# Patient Record
Sex: Female | Born: 1970 | Race: Black or African American | Hispanic: No | Smoking: Current every day smoker
Health system: Southern US, Community
[De-identification: ages and names within clinical notes are randomized; demographics above are authoritative.]

## PROBLEM LIST (undated history)

## (undated) DIAGNOSIS — K219 Gastro-esophageal reflux disease without esophagitis: Secondary | ICD-10-CM

## (undated) DIAGNOSIS — K602 Anal fissure, unspecified: Secondary | ICD-10-CM

## (undated) DIAGNOSIS — E669 Obesity, unspecified: Secondary | ICD-10-CM

## (undated) DIAGNOSIS — D649 Anemia, unspecified: Secondary | ICD-10-CM

## (undated) DIAGNOSIS — T7840XA Allergy, unspecified, initial encounter: Secondary | ICD-10-CM

## (undated) DIAGNOSIS — D259 Leiomyoma of uterus, unspecified: Secondary | ICD-10-CM

## (undated) DIAGNOSIS — K829 Disease of gallbladder, unspecified: Secondary | ICD-10-CM

## (undated) DIAGNOSIS — M542 Cervicalgia: Secondary | ICD-10-CM

## (undated) DIAGNOSIS — I10 Essential (primary) hypertension: Secondary | ICD-10-CM

## (undated) DIAGNOSIS — Z9889 Other specified postprocedural states: Secondary | ICD-10-CM

## (undated) DIAGNOSIS — E0590 Thyrotoxicosis, unspecified without thyrotoxic crisis or storm: Secondary | ICD-10-CM

## (undated) DIAGNOSIS — F419 Anxiety disorder, unspecified: Secondary | ICD-10-CM

## (undated) DIAGNOSIS — R51 Headache: Secondary | ICD-10-CM

## (undated) DIAGNOSIS — E041 Nontoxic single thyroid nodule: Secondary | ICD-10-CM

## (undated) DIAGNOSIS — C50919 Malignant neoplasm of unspecified site of unspecified female breast: Secondary | ICD-10-CM

## (undated) DIAGNOSIS — R112 Nausea with vomiting, unspecified: Secondary | ICD-10-CM

## (undated) DIAGNOSIS — F32A Depression, unspecified: Secondary | ICD-10-CM

## (undated) DIAGNOSIS — G8929 Other chronic pain: Secondary | ICD-10-CM

## (undated) DIAGNOSIS — M1990 Unspecified osteoarthritis, unspecified site: Secondary | ICD-10-CM

## (undated) DIAGNOSIS — F329 Major depressive disorder, single episode, unspecified: Secondary | ICD-10-CM

## (undated) HISTORY — DX: Other chronic pain: G89.29

## (undated) HISTORY — PX: LUMBAR DISC SURGERY: SHX700

## (undated) HISTORY — PX: BREAST REDUCTION SURGERY: SHX8

## (undated) HISTORY — DX: Disease of gallbladder, unspecified: K82.9

## (undated) HISTORY — PX: CERVICAL SPINE SURGERY: SHX589

## (undated) HISTORY — DX: Unspecified osteoarthritis, unspecified site: M19.90

## (undated) HISTORY — DX: Malignant neoplasm of unspecified site of unspecified female breast: C50.919

## (undated) HISTORY — DX: Anxiety disorder, unspecified: F41.9

## (undated) HISTORY — DX: Cervicalgia: M54.2

## (undated) HISTORY — DX: Leiomyoma of uterus, unspecified: D25.9

## (undated) HISTORY — DX: Gastro-esophageal reflux disease without esophagitis: K21.9

## (undated) HISTORY — DX: Essential (primary) hypertension: I10

## (undated) HISTORY — DX: Allergy, unspecified, initial encounter: T78.40XA

## (undated) HISTORY — DX: Major depressive disorder, single episode, unspecified: F32.9

## (undated) HISTORY — DX: Anemia, unspecified: D64.9

## (undated) HISTORY — DX: Thyrotoxicosis, unspecified without thyrotoxic crisis or storm: E05.90

## (undated) HISTORY — DX: Anal fissure, unspecified: K60.2

## (undated) HISTORY — DX: Nontoxic single thyroid nodule: E04.1

## (undated) HISTORY — PX: MOUTH SURGERY: SHX715

## (undated) HISTORY — DX: Depression, unspecified: F32.A

## (undated) HISTORY — DX: Obesity, unspecified: E66.9

---

## 1990-11-19 HISTORY — PX: WISDOM TOOTH EXTRACTION: SHX21

## 1991-11-20 HISTORY — PX: CHOLECYSTECTOMY: SHX55

## 1999-01-20 ENCOUNTER — Other Ambulatory Visit: Admission: RE | Admit: 1999-01-20 | Discharge: 1999-01-20 | Payer: Self-pay | Admitting: *Deleted

## 1999-11-20 HISTORY — PX: OTHER SURGICAL HISTORY: SHX169

## 2000-03-15 ENCOUNTER — Other Ambulatory Visit: Admission: RE | Admit: 2000-03-15 | Discharge: 2000-03-15 | Payer: Self-pay | Admitting: *Deleted

## 2000-04-30 ENCOUNTER — Encounter (INDEPENDENT_AMBULATORY_CARE_PROVIDER_SITE_OTHER): Payer: Self-pay

## 2000-04-30 ENCOUNTER — Other Ambulatory Visit: Admission: RE | Admit: 2000-04-30 | Discharge: 2000-04-30 | Payer: Self-pay | Admitting: Obstetrics & Gynecology

## 2001-02-24 ENCOUNTER — Encounter: Payer: Self-pay | Admitting: Otolaryngology

## 2001-02-24 ENCOUNTER — Ambulatory Visit (HOSPITAL_COMMUNITY): Admission: RE | Admit: 2001-02-24 | Discharge: 2001-02-24 | Payer: Self-pay | Admitting: Otolaryngology

## 2001-03-14 ENCOUNTER — Ambulatory Visit (HOSPITAL_BASED_OUTPATIENT_CLINIC_OR_DEPARTMENT_OTHER): Admission: RE | Admit: 2001-03-14 | Discharge: 2001-03-14 | Payer: Self-pay | Admitting: Otolaryngology

## 2001-04-10 ENCOUNTER — Other Ambulatory Visit: Admission: RE | Admit: 2001-04-10 | Discharge: 2001-04-10 | Payer: Self-pay | Admitting: *Deleted

## 2001-08-08 ENCOUNTER — Encounter: Payer: Self-pay | Admitting: Family Medicine

## 2001-08-08 ENCOUNTER — Ambulatory Visit (HOSPITAL_COMMUNITY): Admission: RE | Admit: 2001-08-08 | Discharge: 2001-08-08 | Payer: Self-pay | Admitting: Family Medicine

## 2001-11-14 ENCOUNTER — Ambulatory Visit (HOSPITAL_COMMUNITY): Admission: RE | Admit: 2001-11-14 | Discharge: 2001-11-14 | Payer: Self-pay | Admitting: Family Medicine

## 2001-11-19 HISTORY — PX: OTHER SURGICAL HISTORY: SHX169

## 2002-02-12 ENCOUNTER — Ambulatory Visit (HOSPITAL_COMMUNITY): Admission: RE | Admit: 2002-02-12 | Discharge: 2002-02-12 | Payer: Self-pay | Admitting: Neurosurgery

## 2002-04-07 ENCOUNTER — Emergency Department (HOSPITAL_COMMUNITY): Admission: EM | Admit: 2002-04-07 | Discharge: 2002-04-08 | Payer: Self-pay | Admitting: Emergency Medicine

## 2002-05-07 ENCOUNTER — Inpatient Hospital Stay (HOSPITAL_COMMUNITY): Admission: RE | Admit: 2002-05-07 | Discharge: 2002-05-12 | Payer: Self-pay | Admitting: Neurosurgery

## 2003-01-18 ENCOUNTER — Ambulatory Visit (HOSPITAL_COMMUNITY): Admission: RE | Admit: 2003-01-18 | Discharge: 2003-01-18 | Payer: Self-pay | Admitting: Neurosurgery

## 2003-01-27 ENCOUNTER — Emergency Department (HOSPITAL_COMMUNITY): Admission: EM | Admit: 2003-01-27 | Discharge: 2003-01-28 | Payer: Self-pay | Admitting: *Deleted

## 2003-06-22 ENCOUNTER — Emergency Department (HOSPITAL_COMMUNITY): Admission: EM | Admit: 2003-06-22 | Discharge: 2003-06-22 | Payer: Self-pay | Admitting: Emergency Medicine

## 2006-02-15 ENCOUNTER — Emergency Department (HOSPITAL_COMMUNITY): Admission: EM | Admit: 2006-02-15 | Discharge: 2006-02-16 | Payer: Self-pay | Admitting: Emergency Medicine

## 2006-06-26 ENCOUNTER — Emergency Department (HOSPITAL_COMMUNITY): Admission: EM | Admit: 2006-06-26 | Discharge: 2006-06-27 | Payer: Self-pay | Admitting: Emergency Medicine

## 2008-09-07 ENCOUNTER — Emergency Department (HOSPITAL_COMMUNITY): Admission: EM | Admit: 2008-09-07 | Discharge: 2008-09-07 | Payer: Self-pay | Admitting: Emergency Medicine

## 2008-10-02 ENCOUNTER — Emergency Department (HOSPITAL_COMMUNITY): Admission: EM | Admit: 2008-10-02 | Discharge: 2008-10-02 | Payer: Self-pay | Admitting: Emergency Medicine

## 2009-03-13 ENCOUNTER — Emergency Department (HOSPITAL_BASED_OUTPATIENT_CLINIC_OR_DEPARTMENT_OTHER): Admission: EM | Admit: 2009-03-13 | Discharge: 2009-03-14 | Payer: Self-pay | Admitting: Emergency Medicine

## 2009-03-19 LAB — CONVERTED CEMR LAB

## 2009-10-05 ENCOUNTER — Emergency Department (HOSPITAL_COMMUNITY): Admission: EM | Admit: 2009-10-05 | Discharge: 2009-10-05 | Payer: Self-pay | Admitting: Family Medicine

## 2010-02-08 ENCOUNTER — Emergency Department (HOSPITAL_COMMUNITY): Admission: EM | Admit: 2010-02-08 | Discharge: 2010-02-08 | Payer: Self-pay | Admitting: Emergency Medicine

## 2010-03-22 ENCOUNTER — Ambulatory Visit: Payer: Self-pay | Admitting: Internal Medicine

## 2010-03-22 DIAGNOSIS — J309 Allergic rhinitis, unspecified: Secondary | ICD-10-CM | POA: Insufficient documentation

## 2010-03-22 DIAGNOSIS — J45909 Unspecified asthma, uncomplicated: Secondary | ICD-10-CM | POA: Insufficient documentation

## 2010-03-22 DIAGNOSIS — K219 Gastro-esophageal reflux disease without esophagitis: Secondary | ICD-10-CM | POA: Insufficient documentation

## 2010-03-22 DIAGNOSIS — G43909 Migraine, unspecified, not intractable, without status migrainosus: Secondary | ICD-10-CM | POA: Insufficient documentation

## 2010-03-22 DIAGNOSIS — F418 Other specified anxiety disorders: Secondary | ICD-10-CM | POA: Insufficient documentation

## 2010-03-22 DIAGNOSIS — M19019 Primary osteoarthritis, unspecified shoulder: Secondary | ICD-10-CM | POA: Insufficient documentation

## 2010-03-22 DIAGNOSIS — Z8679 Personal history of other diseases of the circulatory system: Secondary | ICD-10-CM | POA: Insufficient documentation

## 2010-03-22 DIAGNOSIS — I10 Essential (primary) hypertension: Secondary | ICD-10-CM | POA: Insufficient documentation

## 2010-03-22 DIAGNOSIS — F329 Major depressive disorder, single episode, unspecified: Secondary | ICD-10-CM | POA: Insufficient documentation

## 2010-07-18 ENCOUNTER — Ambulatory Visit: Payer: Self-pay | Admitting: Internal Medicine

## 2010-07-25 ENCOUNTER — Ambulatory Visit: Payer: Self-pay | Admitting: Internal Medicine

## 2010-07-25 DIAGNOSIS — M545 Low back pain, unspecified: Secondary | ICD-10-CM | POA: Insufficient documentation

## 2010-07-25 DIAGNOSIS — J329 Chronic sinusitis, unspecified: Secondary | ICD-10-CM | POA: Insufficient documentation

## 2010-07-25 LAB — CONVERTED CEMR LAB
Basophils Absolute: 0.1 10*3/uL (ref 0.0–0.1)
Basophils Relative: 0.7 % (ref 0.0–3.0)
Bilirubin Urine: NEGATIVE
Eosinophils Absolute: 0.2 10*3/uL (ref 0.0–0.7)
Eosinophils Relative: 1.9 % (ref 0.0–5.0)
HCT: 40.5 % (ref 36.0–46.0)
Hemoglobin, Urine: NEGATIVE
Hemoglobin: 13.9 g/dL (ref 12.0–15.0)
Ketones, ur: NEGATIVE mg/dL
Lymphocytes Relative: 39.5 % (ref 12.0–46.0)
Lymphs Abs: 5.2 10*3/uL — ABNORMAL HIGH (ref 0.7–4.0)
MCHC: 34.3 g/dL (ref 30.0–36.0)
MCV: 89.3 fL (ref 78.0–100.0)
Monocytes Absolute: 1.1 10*3/uL — ABNORMAL HIGH (ref 0.1–1.0)
Monocytes Relative: 8.4 % (ref 3.0–12.0)
Neutro Abs: 6.5 10*3/uL (ref 1.4–7.7)
Neutrophils Relative %: 49.5 % (ref 43.0–77.0)
Nitrite: NEGATIVE
Platelets: 263 10*3/uL (ref 150.0–400.0)
RBC: 4.54 M/uL (ref 3.87–5.11)
RDW: 12.4 % (ref 11.5–14.6)
Specific Gravity, Urine: 1.015 (ref 1.000–1.030)
Total Protein, Urine: NEGATIVE mg/dL
Urine Glucose: NEGATIVE mg/dL
Urobilinogen, UA: 0.2 (ref 0.0–1.0)
WBC: 13.2 10*3/uL — ABNORMAL HIGH (ref 4.5–10.5)
pH: 6.5 (ref 5.0–8.0)

## 2010-12-19 NOTE — Assessment & Plan Note (Signed)
Summary: FU---STC   Vital Signs:  Patient profile:   40 year old female Height:      59 inches Weight:      178.75 pounds O2 Sat:      96 % Temp:     99 degrees F oral Pulse rate:   69 / minute BP sitting:   140 / 86  (left arm) Cuff size:   regular  Vitals Entered By: patty berrocal/intern CC: 4 month f/u Is Patient Diabetic? No Pain Assessment Patient in pain? yes     Location: lower back Type: aching Comments complains of low back pain, radiates into R front leg, also knot in back   Primary Care Provider:  Newt Lukes MD  CC:  4 month f/u.  History of Present Illness: c/o left side back pain - radiates into right anterior thigh - onset 3 weeks ago - no precipitating trauma or injury - worse with standing/walking, better with prone- current pain different than usual pain symptom- location  1) HTN - never on meds for same - onset of problems noted >10mo ago symptoms not a/w CP, HA, edema or vision changes  2) cont c/o abd pain and bloating symptoms better with Align but ran out of samples also better with peppermint - worse with any food but can occur even when fasting - no n/v or change in bowels -  no weight loss, +weight gain over past 6 months no d/c or brbpr or melena not using any OTC meds for gerd  3) cont c/o allg rhinitis and sinus pressure - +sneezing and clear nasal discharge - no fever, no HA or vision change - no cough using allegra and fonase but increasing pressure below both eyes  4) chronic pain - neck and right shoulder - recieved steroid and trigger point injections at pain clinic in [place] for same - also uses as needed lortab for pain symptoms   5) hx depression - not taking meds >22mos - reports her faith and her dog help her control her symptoms -  no depression signs or symptoms active at this time   Current Medications (verified): 1)  Celebrex 200 Mg Caps (Celecoxib) .... Take 1 Two Times A Day 2)  Zanaflex 6 Mg Caps  (Tizanidine Hcl) .... Take 1 Three Times A Day As Needed 3)  Align  Caps (Probiotic Product) .Marland Kitchen.. 1 By Mouth Once Daily 4)  Pepcid 20 Mg Tabs (Famotidine) .Marland Kitchen.. 1 By Mouth Once Daily 5)  Flonase 50 Mcg/act Susp (Fluticasone Propionate) .... 2 Sprays Each Nostril  Every Morning 6)  Hydrocodone-Acetaminophen 10-325 Mg Tabs (Hydrocodone-Acetaminophen) .... Take 1 Three Times A Day As Needed  Allergies (verified): 1)  ! Morphine  Past History:  Past Medical History: Allergic rhinitis Asthma Depression GERD Hypertension   MD roster: pain - [facility] - ([place] [address]) [facility] - [facility]  Review of Systems  The patient denies headaches, incontinence, muscle weakness, suspicious skin lesions, difficulty walking, and depression.    Physical Exam  General:  alert, well-developed, well-nourished, and cooperative to examination.   short stature, overweight - female friend at side Lungs:  normal respiratory effort, no intercostal retractions or use of accessory muscles; normal breath sounds bilaterally - no crackles and no wheezes.    Heart:  normal rate, regular rhythm, no murmur, and no rub. BLE without edema. normal DP pulses and normal cap refill in all 4 extremities    Abdomen:  soft, non-tender, normal bowel sounds,  no distention; no masses and no appreciable hepatomegaly or splenomegaly.   Msk:  back: full range of motion of lumbar spine. Nontender to palpation of vert but +paraspinal spasm over right side. Negative straight leg raise. Deep tendon reflexes symmetrically intact. Sensation intact throughout all dermatomes in bilateral lower extremities. Full strength to manual muscle testing in all major muscule groups. Able to heel and toe walk without difficulty and ambulates with a normal gait.    Impression & Recommendations:  Problem # 1:  LOW BACK PAIN, ACUTE (ICD-724.2) 3 weeks symptoms -  tx pred pak and change ,uscle relaxant - gets narcotics from pain mgmt  - check plain film now - consider need for MRI depending on response to tx consider PT or referral back to pain clinic to consider shots (as these helped for neck pain symptoms) also check UA The following medications were removed from the medication list:    Lortab 10-500 Mg Tabs (Hydrocodone-acetaminophen) .Marland Kitchen... Take 1 three times a day as needed Her updated medication list for this problem includes:    Celebrex 200 Mg Caps (Celecoxib) .Marland Kitchen... Take 1 two times a day    Zanaflex 6 Mg Caps (Tizanidine hcl) .Marland Kitchen... Take 1 three times a day as needed    Hydrocodone-acetaminophen 10-325 Mg Tabs (Hydrocodone-acetaminophen) .Marland Kitchen... Take 1 three times a day as needed    Flexeril 10 Mg Tabs (Cyclobenzaprine hcl) .Marland Kitchen... 1 by mouth three times a day as needed for muscle spasm (in place of zanaflex)  Orders: TLB-Udip w/ Micro (81001-URINE) T-Lumbar Spine 2 Views (72100TC) Prescription Created Electronically [phone])  Problem # 2:  UNSPECIFIED SINUSITIS (ICD-473.9) chronic allergies - check labs and tx emperic abx given LGF - cont antihist and nasal steroids - Her updated medication list for this problem includes:    Flonase 50 Mcg/act Susp (Fluticasone propionate) .Marland Kitchen... 2 sprays each nostril  every morning    Augmentin 875-125 Mg Tabs (Amoxicillin-pot clavulanate) .Marland Kitchen... 1 by mouth two times a day x 7 days  Orders: TLB-CBC Platelet - w/Differential (85025-CBCD) Prescription Created Electronically [phone])  Complete Medication List: 1)  Celebrex 200 Mg Caps (Celecoxib) .... Take 1 two times a day 2)  Zanaflex 6 Mg Caps (Tizanidine hcl) .... Take 1 three times a day as needed 3)  Align Caps (Probiotic product) .Marland Kitchen.. 1 by mouth once daily 4)  Pepcid 20 Mg Tabs (Famotidine) .Marland Kitchen.. 1 by mouth once daily 5)  Flonase 50 Mcg/act Susp (Fluticasone propionate) .... 2 sprays each nostril  every morning 6)  Hydrocodone-acetaminophen 10-325 Mg Tabs (Hydrocodone-acetaminophen) .... Take 1 three times a day as  needed 7)  Augmentin 875-125 Mg Tabs (Amoxicillin-pot clavulanate) .Marland Kitchen.. 1 by mouth two times a day x 7 days 8)  Prednisone (pak) 10 Mg Tabs (Prednisone) .... As directed x 6 days 9)  Flexeril 10 Mg Tabs (Cyclobenzaprine hcl) .Marland Kitchen.. 1 by mouth three times a day as needed for muscle spasm (in place of zanaflex)  Patient Instructions: 1)  it was good to see you today. 2)  test(s) ordered today - your results will be posted on the phone tree for review in 48-72 hours from the time of test completion; call [phone] and enter your 9 digit MRN (listed above on this page, just below your name); if any changes need to be made or there are abnormal results, you will be contacted directly.  3)  change zanaflex to Flexeril for muscle spasm and use pred pak for inflammation -  4)  Augmentin for sinus  infection - continue flonase and claritin for allergy and sinus symptoms  5)  your prescriptions have been electronically submitted to your pharmacy. Please take as directed. Contact our office if you believe you're having problems with the medication(s).  6)  If continued back/leg pain despite these treatments, call and we will refer to physical therapy and/or your pain clinic to consider shots for your back pain Prescriptions: FLEXERIL 10 MG TABS (CYCLOBENZAPRINE HCL) 1 by mouth three times a day as needed for muscle spasm (in place of zanaflex)  #40 x 1   Entered and Authorized by:   Newt Lukes MD   Signed by:   Newt Lukes MD on 07/25/2010   Method used:   Electronically to        [facility]  [phone]* (retail)       [address]       [place], [place]  [address]       Ph: [phone] or [phone]       Fax: [phone]   RxID:   [id number] PREDNISONE (PAK) 10 MG TABS (PREDNISONE) as directed x 6 days  #1 x 0   Entered and Authorized by:   Newt Lukes MD   Signed by:   Newt Lukes MD on 07/25/2010   Method used:    Electronically to        [facility]  [phone]* (retail)       [address]       [place], [place]  [address]       Ph: [phone] or [phone]       Fax: [phone]   RxID:   [id number] AUGMENTIN 875-125 MG TABS (AMOXICILLIN-POT CLAVULANATE) 1 by mouth two times a day x 7 days  #14 x 0   Entered and Authorized by:   Newt Lukes MD   Signed by:   Newt Lukes MD on 07/25/2010   Method used:   Electronically to        [facility]  [phone]* (retail)       [address]       [place], [place]  [address]       Ph: [phone] or [phone]       Fax: [phone]   RxID:   [id number]

## 2010-12-19 NOTE — Assessment & Plan Note (Signed)
Summary: NEW MEDICARE PT--PKG--STC  RS'D PER PT/NWS   Vital Signs:  Patient profile:   40 year old female Height:      59 inches (149.86 cm) Weight:      178.4 pounds (81.09 kg) BMI:     36.16 O2 Sat:      96 % on Room air Temp:     98.3 degrees F (36.83 degrees C) oral Pulse rate:   86 / minute BP sitting:   122 / 80  (left arm) Cuff size:   large  Vitals Entered By: Orlan Leavens (Mar 22, 2010 9:53 AM)  O2 Flow:  Room air CC: New patient/ Pt concern about elevated BP Is Patient Diabetic? No Pain Assessment Patient in pain? no        Primary Care Provider:  Newt Lukes MD  CC:  New patient/ Pt concern about elevated BP.  History of Present Illness: new pt to me and our practice, here to est care  1) concerned about elevated BP readings - onset of problems noted symptoms not a/w CP, HA, edema or vision changes  2) c/o abd pain and bloating symptoms better with Align but ran out of samples also better with peppermint - worse with any food but can occur even when fasting - no n/v or change in bowels -  no weight loss, +weight gain over past 6 months no d/c or brbpr or melena not using any OTC meds for gerd  3) c/o allg rhinitis and sinus pressure - +sneezing and clear nasla discharge - no fever, no HA or vision change - no cough not using any otc or rx meds for same   4) chronic pain - neck and right shoulder - recieved steroid and trigger point injections at pain clinic in [place] for same - also uses as needed lortab for pain symptoms   5) hx depression - not taking meds >50mos - reports her faith and her dog help her control her symptoms -  no depression signs or symptoms active at this time   Preventive Screening-Counseling & Management  Alcohol-Tobacco     Alcohol drinks/day: 0     Smoking Status: current     Packs/Day: 0.75     Tobacco Counseling: to quit use of tobacco products  Caffeine-Diet-Exercise     Diet Counseling: to improve diet;  diet is suboptimal     Does Patient Exercise: no     Exercise Counseling: to improve exercise regimen     Depression Counseling: not indicated; screening negative for depression  Clinical Review Panels:  Prevention   Last Pap Smear:  Interpretation/ Result:Negative for intraepithelial Lesion or Malignancy.    (03/19/2009)  Immunizations   Last Tetanus Booster:  Td (03/22/2010)   Current Medications (verified): 1)  Zoloft 100 Mg Tabs (Sertraline Hcl) .... Take 1 Two Times A Day 2)  Lortab 10-500 Mg Tabs (Hydrocodone-Acetaminophen) .... Take 1 Three Times A Day As Needed 3)  Celebrex 200 Mg Caps (Celecoxib) .... Take 1 Two Times A Day 4)  Zanaflex 6 Mg Caps (Tizanidine Hcl) .... Take 1 Three Times A Day As Needed  Allergies (verified): 1)  ! Morphine  Past History:  Past Medical History: Allergic rhinitis Asthma Depression GERD Hypertension  MD rooster: pain - [facility] - [place] [address] [facility] [place]  Past Surgical History: Cholecystectomy (1993) Chirai Malformation (2003) Laberal tear right shoulder (2001) Wisdom teeth removal (1992)  Family History: Family History of Alcoholism/Addiction (  parent, grandparent) Family History of Arthritis (parent) Family History Breast cancer 1st degree relative <50 (parent) Family History High cholesterol (parent) Family History Hypertension (parent)  Social History: Current Smoker - 1/2-1ppd; denies alcohol use lives with finace and dtr on disablitiy Smoking Status:  current Packs/Day:  0.75 Does Patient Exercise:  no  Review of Systems       see HPI above. I have reviewed all other systems and they were negative.   Physical Exam  General:  alert, well-developed, well-nourished, and cooperative to examination.   short stature, overweight - dtr at side Eyes:  vision grossly intact; pupils equal, round and reactive to light.  conjunctiva and lids normal.    Ears:  normal pinnae bilaterally,  without erythema, swelling, or tenderness to palpation. TMs clear, without effusion, or cerumen impaction. Hearing grossly normal bilaterally  Mouth:  teeth and gums in good repair; mucous membranes moist, without lesions or ulcers. oropharynx clear without exudate, no erythema. +PND Lungs:  normal respiratory effort, no intercostal retractions or use of accessory muscles; normal breath sounds bilaterally - no crackles and no wheezes.    Heart:  normal rate, regular rhythm, no murmur, and no rub. BLE without edema. normal DP pulses and normal cap refill in all 4 extremities    Abdomen:  soft, non-tender, normal bowel sounds, no distention; no masses and no appreciable hepatomegaly or splenomegaly.   Genitalia:  defer to gyn Msk:  No deformity or scoliosis noted of thoracic or lumbar spine.   Neurologic:  alert & oriented X3 and cranial nerves II-XII symetrically intact.  strength normal in all extremities, sensation intact to light touch, and gait normal. speech fluent without dysarthria or aphasia; follows commands with good comprehension.  Skin:  no rashes, vesicles, ulcers, or erythema. No nodules or irregularity to palpation.  Psych:  Oriented X3, memory intact for recent and remote, normally interactive, good eye contact, not anxious appearing, not depressed appearing, and not agitated.      Impression & Recommendations:  Problem # 1:  HYPERTENSION (ICD-401.9)  home log reviewd - occ BP 140/90 but most 110-130 BP today ok -  advised diet, exercise, low salt and follow bp trend - no meds rx at this time  BP today: 122/80  Problem # 2:  GERD (ICD-530.81)  start new tx for same + align for bloating - no concerning features at this time by hx or exam Her updated medication list for this problem includes:    Pepcid 20 Mg Tabs (Famotidine) .Marland Kitchen... 1 by mouth once daily  Orders: Prescription Created Electronically [phone])  Problem # 3:  ALLERGIC RHINITIS (ICD-477.9)  Her updated  medication list for this problem includes:    Flonase 50 Mcg/act Susp (Fluticasone propionate) .Marland Kitchen... 2 sprays each nostril  every morning  Discussed use of allergy medications and environmental measures.   Orders: Prescription Created Electronically [phone])  Problem # 4:  DEPRESSION (ICD-311) stable off meds - watch for recurrent symptoms  The following medications were removed from the medication list:    Zoloft 100 Mg Tabs (Sertraline hcl) .Marland Kitchen... Take 1 two times a day  Complete Medication List: 1)  Lortab 10-500 Mg Tabs (Hydrocodone-acetaminophen) .... Take 1 three times a day as needed 2)  Celebrex 200 Mg Caps (Celecoxib) .... Take 1 two times a day 3)  Zanaflex 6 Mg Caps (Tizanidine hcl) .... Take 1 three times a day as needed 4)  Align Caps (Probiotic product) .Marland Kitchen.. 1 by mouth once daily 5)  Pepcid 20 Mg Tabs (Famotidine) .Marland Kitchen.. 1 by mouth once daily 6)  Flonase 50 Mcg/act Susp (Fluticasone propionate) .... 2 sprays each nostril  every morning  Other Orders: TD Toxoids IM 7 YR + (46962) Admin 1st Vaccine (95284)  Patient Instructions: 1)  it was good to see you today. 2)  start Pepcid and Align every day for your stomach symptoms and bloating -  3)  also start nasal steroid for allg and sinus symptoms -flonase 4)  your prescriptions have been electronically submitted to your pharmacy. Please take as directed. Contact our office if you believe you're having problems with the medication(s).  5)  will not start medication for high blood pressure at this time but monitor BP and call if over 140/90 6)  will send for records and labs from [facility] 7)  Please schedule a follow-up appointment in 4-6 months to recheck, sooner if problems.  Prescriptions: FLONASE 50 MCG/ACT SUSP (FLUTICASONE PROPIONATE) 2 sprays each nostril  every morning  #1 x 3   Entered and Authorized by:   Newt Lukes MD   Signed by:   Newt Lukes MD on 03/22/2010   Method used:   Electronically  to        [facility]  [phone]* (retail)       [address]       [place], [place]  [address]       Ph: [phone] or [phone]       Fax: [phone]   RxID:   [id number] PEPCID 20 MG TABS (FAMOTIDINE) 1 by mouth once daily  #30 x 3   Entered and Authorized by:   Newt Lukes MD   Signed by:   Newt Lukes MD on 03/22/2010   Method used:   Electronically to        [facility]  [phone]* (retail)       [address]       [place], [place]  [address]       Ph: [phone] or [phone]       Fax: [phone]   RxID:   [id number]    Pap Smear  Procedure date:  03/19/2009  Findings:      Interpretation/ Result:Negative for intraepithelial Lesion or Malignancy.       Immunizations Administered:  Tetanus Vaccine:    Vaccine Type: Td    Site: right deltoid    Mfr: Sanofi Pasteur    Dose: 0.5 ml    Route: IM    Given by: Orlan Leavens    Exp. Date: 12/02/2011    Lot #: [id number]    VIS given: 03/22/10

## 2010-12-19 NOTE — Letter (Signed)
Summary: Records Dated 01-30-08 thru 02-14-10/[facility]  Records Dated 01-30-08 thru 02-14-10/[facility]   Imported By: Lanelle Bal 03/31/2010 08:21:25  _____________________________________________________________________  External Attachment:    Type:   Image     Comment:   External Document

## 2010-12-21 ENCOUNTER — Telehealth (INDEPENDENT_AMBULATORY_CARE_PROVIDER_SITE_OTHER): Payer: Self-pay | Admitting: *Deleted

## 2010-12-27 NOTE — Progress Notes (Signed)
  Phone Note Other Incoming   Request: Send information Summary of Call: Request for records received from [facility]. Request forwarded to [facility].  2010-2012    

## 2011-02-09 LAB — URINE MICROSCOPIC-ADD ON

## 2011-02-09 LAB — URINALYSIS, ROUTINE W REFLEX MICROSCOPIC
Bilirubin Urine: NEGATIVE
Glucose, UA: NEGATIVE mg/dL
Ketones, ur: 15 mg/dL — AB
Nitrite: NEGATIVE
Protein, ur: NEGATIVE mg/dL
Specific Gravity, Urine: 1.02 (ref 1.005–1.030)
Urobilinogen, UA: 1 mg/dL (ref 0.0–1.0)
pH: 6 (ref 5.0–8.0)

## 2011-02-09 LAB — CBC
HCT: 40.2 % (ref 36.0–46.0)
Hemoglobin: 13 g/dL (ref 12.0–15.0)
MCHC: 32.5 g/dL (ref 30.0–36.0)
MCV: 88.9 fL (ref 78.0–100.0)
Platelets: 280 10*3/uL (ref 150–400)
RBC: 4.52 MIL/uL (ref 3.87–5.11)
RDW: 12.4 % (ref 11.5–15.5)
WBC: 11.7 10*3/uL — ABNORMAL HIGH (ref 4.0–10.5)

## 2011-02-09 LAB — POCT PREGNANCY, URINE: Preg Test, Ur: NEGATIVE

## 2011-02-09 LAB — ABO/RH: ABO/RH(D): O POS

## 2011-03-26 ENCOUNTER — Other Ambulatory Visit: Payer: Self-pay | Admitting: Internal Medicine

## 2011-03-29 ENCOUNTER — Emergency Department (HOSPITAL_COMMUNITY): Payer: No Typology Code available for payment source

## 2011-03-29 ENCOUNTER — Emergency Department (HOSPITAL_COMMUNITY)
Admission: EM | Admit: 2011-03-29 | Discharge: 2011-03-29 | Disposition: A | Discharge status: Home / Self Care | Payer: No Typology Code available for payment source | Attending: Emergency Medicine | Admitting: Emergency Medicine

## 2011-03-29 DIAGNOSIS — R51 Headache: Secondary | ICD-10-CM | POA: Insufficient documentation

## 2011-03-29 DIAGNOSIS — S139XXA Sprain of joints and ligaments of unspecified parts of neck, initial encounter: Secondary | ICD-10-CM | POA: Insufficient documentation

## 2011-03-29 DIAGNOSIS — Y92410 Unspecified street and highway as the place of occurrence of the external cause: Secondary | ICD-10-CM | POA: Insufficient documentation

## 2011-03-29 DIAGNOSIS — S335XXA Sprain of ligaments of lumbar spine, initial encounter: Secondary | ICD-10-CM | POA: Insufficient documentation

## 2011-03-29 DIAGNOSIS — F17200 Nicotine dependence, unspecified, uncomplicated: Secondary | ICD-10-CM | POA: Insufficient documentation

## 2011-04-03 ENCOUNTER — Emergency Department (HOSPITAL_BASED_OUTPATIENT_CLINIC_OR_DEPARTMENT_OTHER)
Admission: EM | Admit: 2011-04-03 | Discharge: 2011-04-03 | Disposition: A | Discharge status: Home / Self Care | Payer: No Typology Code available for payment source | Attending: Emergency Medicine | Admitting: Emergency Medicine

## 2011-04-03 DIAGNOSIS — IMO0002 Reserved for concepts with insufficient information to code with codable children: Secondary | ICD-10-CM | POA: Insufficient documentation

## 2011-04-03 DIAGNOSIS — Y92410 Unspecified street and highway as the place of occurrence of the external cause: Secondary | ICD-10-CM | POA: Insufficient documentation

## 2011-04-03 DIAGNOSIS — J45909 Unspecified asthma, uncomplicated: Secondary | ICD-10-CM | POA: Insufficient documentation

## 2011-04-03 DIAGNOSIS — S335XXA Sprain of ligaments of lumbar spine, initial encounter: Secondary | ICD-10-CM | POA: Insufficient documentation

## 2011-04-06 NOTE — Discharge Summary (Signed)
[facility]  Patient:    Emily [name], Emily [name] Visit Number: [id number] MRN: [id number]          Service Type: SUR Location: [id number] Attending Physician:  Cristi Loron Dictated by:   Cristi Loron, M.D. Admit Date:  05/07/2002 Discharge Date: 05/12/2002                             Discharge Summary  BRIEF HISTORY:  The patient is a 40 year old black female who has suffered from headaches and neck pain. 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 The patient failed medical management, therefore she weighed the risks, benefits and alternatives of surgery. She decided to proceed with a Chiari decompression. For further details of this admission please refer to the typed history and physical.  HOSPITAL COURSE:  I admitted the patient to [facility] on May 07, 2002, with a diagnosis of a Chiari I malformation. On the day of admission I performed a suboccipital craniectomy, C1 laminectomy and duraplasty for Chiari I decompression. The surgery went well without complications. (For full details of this operation please refer to the typed operative note.)  The patients postoperative course was essentially unremarkable, except that she had a lot of pain which was managed with pain medications. She was quite slow to ambulate and slow to take p.o.s.  By postoperative day #5, May 12, 2002, the patient was afebrile, Vital signs were stable. She was eating well and ambulating well. The wound was healing well without signs of infection, and she was requesting discharge to home. She was neurologically normal. She was therefore discharged to home on May 12, 2002.  DISCHARGE INSTRUCTIONS:  The patient was given written discharge instructions.  FOLLOWUP:  She was instructed to follow up with me in four weeks.  DISCHARGE MEDICATIONS: 1. Percocet 5, 1 to 2 p.o. q.4h. p.r.n. pain, #50, limit 8 per day,  no refills. 2. Valium 5 mg 1 p.o. q.6h. p.r.n. muscle spasms, #50, no refills.  FINAL DIAGNOSES: Chiari I malformation.  PROCEDURES PERFORMED: Suboccipital craniectomy, C1 laminectomy and duraplasty for a Chiari I malformation using microdissection. Dictated by:   Cristi Loron, M.D. Attending Physician:  Tressie Stalker D DD:  05/12/02 TD:  05/12/02 Job: [id number]

## 2011-04-06 NOTE — Op Note (Signed)
[place]  Patient:    Emily [name], Emily [name] Visit Number: [id number] MRN: [id number]          Service Type: SUR Location: [id number] Attending Physician:  Cristi Loron Dictated by:   Cristi Loron, M.D. Proc. Date: 05/07/02 Admit Date:  05/07/2002                             Operative Report  PREOPERATIVE DIAGNOSIS:  Chiari I malformation.  POSTOPERATIVE DIAGNOSIS:  Chiari I malformation.  PROCEDURE:  Suboccipital craniectomy, C1 laminectomy, and duraplasty for Chiari I decompression using microdissection.  SURGEON: Cristi Loron, M.D.  ASSISTANT:  Tanya Nones. Jeral Fruit, M.D.  ANESTHESIA:  General endotracheal.  ESTIMATED BLOOD LOSS:  250 cc.  SPECIMENS:  None.  DRAINS:  None.  COMPLICATIONS:  None.  BRIEF HISTORY:  The patient is a 40 year old black female who has suffered from headaches and neck pain.  She was worked up with a brain MRI, which demonstrated a Chiari I malformation.  The patient failed medical management and therefore weighed the risks, benefits, and alternatives of surgery and decided to proceed with a Chiari decompression.  DESCRIPTION OF PROCEDURE:  The patient was brought to the operating room by the anesthesia team.  General endotracheal anesthesia was induced.  The Mayfield three-point head rest was applied to the patients calvarium.  The patient was then turned to the prone position on chest rolls.  Her arms were tucked by her sides.  Her posterior cervical and suboccipital regions were then shaved and prepared with Betadine scrub and Betadine solution, and sterile drapes were applied.  I then injected the area to be incised with Marcaine with epinephrine solution and then used the scalpel to make a midline incision over the upper cervical spine and suboccipital region.  I used electrocautery to incise the cervicothoracic fascia, and I performed a bilateral subperiosteal dissection, stripping  the paraspinous musculature from the spinous processes and laminae of C1, C2, and the suboccipital region using a cerebellar retractor for exposure and then used the high-speed drill to decorticate the suboccipital region until there was only a thin rim of bone overlying the underlying dura and then to thin out the C1 lamina.  I then brought the operating microscope into the field and under its magnification and illumination, I completed the decompression.  I used the Kerrison punch to complete the suboccipital craniectomy and C1 laminectomy as well as removal of the occipital C1 and C1-2 ligamentum flavum, exposing the underlying dura.  I then incised the dura with a 15 blade scalpel in a Y-shaped incision, and the arachnoid more or less remained intact.  I could clearly see the cerebellar tonsils herniating through the foramen magnum.  I then obtained a Dura-Guard dural substitute and cut it into a triangular fashion and then performed a duraplasty to expand the patients dura.  I completed the duraplasty with interrupted and then running 4-0 Nurolon suture.  I had anesthesia Valsalva the patient, and it seemed to be a good, watertight closure.  I then laid a large piece of Gelfoam over the exposed dura and then achieved stringent hemostasis using bipolar electrocautery.  The patients fascia was then reapproximated with interrupted #1 Vicryl suture and the subcutaneous tissue with interrupted 2-0 Vicryl suture and the skin with Steri-Strips and benzoin. The wound was then coated with bacitracin ointment and a sterile dressing applied.  The drapes were removed and  the patient was subsequently returned to a supine position, where the Mayfield three-point head rest was removed and the patient was subsequently extubated by the anesthesia team and transported to the postanesthesia care unit in stable condition.  All sponge, instrument, and needle counts were correct at the end of this  case. Dictated by:   Cristi Loron, M.D. Attending Physician:  Tressie Stalker D DD:  05/07/02 TD:  05/09/02 Job: [id number]

## 2011-04-06 NOTE — Op Note (Signed)
[place]. [facility]  Patient:    Emily [name], Emily [name]                       MRN: [id number] Proc. Date: 03/14/01 Adm. Date:  [id number] Attending:  Fernande Boyden CC:         Dr. Lilyan Punt   Operative Report  PREOPERATIVE DIAGNOSIS:  Left maxillary oroantral fistula.  POSTOPERATIVE DIAGNOSIS:  Left maxillary oroantral fistula.  PROCEDURE: 1. Closure, left oroantral fistula, with palatal rotation flap. 2. Nasal endoscopy.  SURGEON:  Gloris Manchester. Lazarus Salines, M.D.  ANESTHESIA:  General orotracheal.  ESTIMATED BLOOD LOSS:  Minimal.  COMPLICATIONS:  None.  FINDINGS:  A well-healed 2 x 4 mm epithelialized oroantral fistula on the posterior left maxillary alveolus.  With catheter irrigation, good egress of saline from the natural ostium.  With exploration, thickened epithelium and mucosal tract, which was cleaned.  Edematous maxillary sinus mucosa with only a small quantity of residual mucopus.  Soft bone at the periphery of the fistula.  Upon final debridement in preparation for closure, the bony fistula measured 12 mm in diameter and circular.  DESCRIPTION OF PROCEDURE:  With the patient in a comfortable supine position, general orotracheal anesthesia was induced without difficulty.  At an appropriate level, the patient was placed in a slight sitting position.  Nasal vibrissae were trimmed.  Saline-moistened throat pack was placed.  Cocaine crystals, 100 mg total, were applied on cotton carriers to the anterior ethmoid and sphenopalatine ganglion regions on both sides.  Cocaine solution, 160 mg total, was applied on 1/2 x 3 inch cottonoids up into the middle meatus, between the middle turbinate and the septum, and lower on the nasal septum.  Finally, 1% Xylocaine with 1:100,000 epinephrine, 8 cc total, was infiltrated into the submucoperichondrial plane of the left septum and into the lateral wall of the nose.  Several minutes were allowed for this to  take effect.  A sterile preparation and draping of the midface was accomplished.  Taking care to protect the lips, teeth, and endotracheal tube, the Crowe-Davis mouth gag was introduced, expanded for visualization, and propped on towels on the patients chest.  This allowed access to the oral fistula.  A #8 French red rubber catheter was placed into the fistula into the maxillary sinus.  The materials were removed from the nose and appeared to be intact and correct in number.  The 0 degree endoscope was introduced, and inspection of the middle meatus was performed.  There was no frank pus.  There were no edematous changes or polyps noted.  With the irrigation of the intrasinus catheter, a tiny quantity of mucopus was drained, following which good flow of saline was observed. No further attention to the nose or sinuses was felt to be necessary.  The endoscope was removed.  Hemostasis was observed.  Working back in the mouth with a 2 mm margin around, the fistula was sharply incised down to bone and carefully dissected up into the sinus and removed. The mucosa around the fistula was dissected in the periosteal layer.  A linear incision along the alveolus in front of the fistula was made to allow better elevation of the alveolar mucosa, which was performed.  Working up into the antrum, the mucosa was cleared away from the edges of the perforation and removed piecemeal with the [facility] forceps.  Upon clearing the alveolus rather liberally in this vicinity, a well-irrigated cutting bur was used to lower bony spicules and  partitions, to lower the overall vertical height of the alveolus, especially at the posterior buttress, and finally the fistula itself was smoothed and any soft bone was debrided.  Hemostasis was spontaneous.  At this point, the fistula was finalized, and preparations were made for closure.  It was elected to perform a posteriorly-based palatal mucosal rotation flap.   Working at the level of the anterior edge of the fistula, a rotation flap 1.5 cm in diameter, approximately 4 cm long, was planned and sharply executed.  This was separated from the fistula and elevated, remaining pedicles on the greater palatal foramen vessels.  The mucosa surrounding the fistula had been thoroughly elevated in all directions.  The rotation flap was swung over the fistula.  There was a close margin as far as overlap at the anterior border, and the lateral alveolar mucosa anterior to the fistula was elevated and back-cut into the buccal mucosa for a short distance to allow additional rotation from laterally to close the anterior edge.  The flap was rotated in position and secured with a 4-0 Vicryl stitch.  The posterior wounds were all closed with interrupted 4-0 Vicryl.  Small rotation flap was rotated into position and secured with a 4-0 Vicryl.  The posterior aspect of the palatal incision was closed with a single stitch of 3-0 Vicryl.  The anterior portion was allowed to heal by secondary intention and comprised a defect of approximately 2 cm x 8 mm at widest extent and crescent in shape. Hemostasis was observed.  The fistula was well-covered.  At this point, the procedure was completed.  The pharynx was suctioned free, and the throat pack was removed.  The mouth gag was relaxed for several minutes.  Upon re-expansion, hemostasis was persistent.  At this point, the mouth gag was relaxed and removed.  The dental status was intact.  The patient was returned to anesthesia, awakened, extubated, and transferred to recovery in stable condition.  COMMENT:  a 40 year old black female, now almost three months status post dental extraction with a secondary oral antral fistula and persistent left maxillary sinusitis, was the indication for todays procedure.  The patient was pretreated with Clindamycin, with its good bone penetration and anaerobic coverage.  The procedure today  was designed to remove healthy tissue across the fistula and remove any osteomyelitic bone or diseased mucosa and also  ascertain adequate sinus drainage.  This was all accomplished as described above.  Anticipate a routine postoperative recovery with attention to analgesia, ice, elevation, antibiotics.  Will avoid blowing the nose and also avoid anything that requires any chewing activity for the next two weeks. Given low anticipated risk of postanesthetic or postsurgical complications, feel an outpatient venue is appropriate. DD:  03/14/01 TD:  03/15/01 Job: [id number]

## 2011-04-23 ENCOUNTER — Ambulatory Visit (INDEPENDENT_AMBULATORY_CARE_PROVIDER_SITE_OTHER): Payer: No Typology Code available for payment source | Admitting: Internal Medicine

## 2011-04-23 ENCOUNTER — Encounter: Payer: Self-pay | Admitting: Internal Medicine

## 2011-04-23 VITALS — BP 122/82 | HR 107 | Temp 99.3°F | Resp 16 | Wt 178.8 lb

## 2011-04-23 DIAGNOSIS — M5416 Radiculopathy, lumbar region: Secondary | ICD-10-CM

## 2011-04-23 DIAGNOSIS — IMO0002 Reserved for concepts with insufficient information to code with codable children: Secondary | ICD-10-CM

## 2011-04-23 MED ORDER — PREDNISONE (PAK) 10 MG PO TABS: 10.0000 mg | ORAL_TABLET | ORAL | Status: AC

## 2011-04-23 MED ORDER — METHOCARBAMOL 750 MG PO TABS: 750.0000 mg | ORAL_TABLET | Freq: Four times a day (QID) | ORAL | Status: AC
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
## 2011-04-23 NOTE — Patient Instructions (Signed)
It was good to see you today. We have reviewed your prior records including labs and tests today Use 12d prednisone pak and Robaxin for muscle relaxant as discussed until follow up with your pain clinic in [place] - Your prescription(s) have been submitted to your pharmacy. Please take as directed and contact our office if you believe you are having problem(s) with the medication(s). Other medications reviewed, no changes at this time. (but do not take ibuprofen while on prednisone)

## 2011-04-24 ENCOUNTER — Encounter: Payer: Self-pay | Admitting: Internal Medicine

## 2011-05-19 ENCOUNTER — Ambulatory Visit (INDEPENDENT_AMBULATORY_CARE_PROVIDER_SITE_OTHER): Payer: No Typology Code available for payment source | Admitting: Family Medicine

## 2011-05-19 VITALS — BP 150/100 | Temp 98.8°F | Wt 183.2 lb

## 2011-05-19 DIAGNOSIS — J329 Chronic sinusitis, unspecified: Secondary | ICD-10-CM

## 2011-05-19 MED ORDER — AMOXICILLIN-POT CLAVULANATE 875-125 MG PO TABS: 1.0000 | ORAL_TABLET | Freq: Two times a day (BID) | ORAL | Status: AC

## 2011-05-19 MED ORDER — ALBUTEROL SULFATE HFA 108 (90 BASE) MCG/ACT IN AERS: 2.0000 | INHALATION_SPRAY | RESPIRATORY_TRACT | Status: DC | PRN

## 2011-05-19 NOTE — Progress Notes (Signed)
  Subjective:    [name], female    DOB: May 15, 1971, 40 y.o.   MRN: [id number]  HPI Facial pain- sxs started Monday.  L side of face.  No fevers.  + ear pressure.  + jaw pressure (no teeth posteriorly), tooth pain anteriorly.  Using netti pot and getting thick mucous in return.  Denies cough.  L sided nasal congestion/blockage.  Took [facility] Severe Sinus.   Review of Systems For ROS see HPI     Objective:   Physical Exam  Constitutional: She appears well-developed and well-nourished. No distress.  HENT:  Head: Normocephalic and atraumatic.  Right Ear: Tympanic membrane normal.  Left Ear: Tympanic membrane normal.  Nose: Mucosal edema and rhinorrhea present. Right sinus exhibits no maxillary sinus tenderness and no frontal sinus tenderness. Left sinus exhibits maxillary sinus tenderness and frontal sinus tenderness.  Mouth/Throat: Uvula is midline and mucous membranes are normal. Abnormal dentition. Dental caries present. No dental abscesses. Posterior oropharyngeal erythema present. No oropharyngeal exudate.  Eyes: Conjunctivae and EOM are normal. Pupils are equal, round, and reactive to light.  Neck: Normal range of motion. Neck supple.  Cardiovascular: Normal rate, regular rhythm and normal heart sounds.   Pulmonary/Chest: Effort normal and breath sounds normal. No respiratory distress. She has no wheezes.  Lymphadenopathy:    She has no cervical adenopathy.          Assessment & Plan:

## 2011-05-19 NOTE — Assessment & Plan Note (Signed)
Pt w/ L maxillary sinusitis.  Also has mild TTP over frontal sinus.  No apparent dental abscess despite having 2 teeth on upper left that are obviously decaying and in need of being pulled.  Start abx.  Reviewed supportive care and red flags that should prompt return.  Pt expressed understanding and is in agreement w/ plan.

## 2011-05-19 NOTE — Patient Instructions (Signed)
This appears to be a sinus infection Take the Augmentin as directed- take w/ food to avoid upset stomach Add Mucinex to thin your congestion Tylenol or ibuprofen for pain Hot compresses to the face will help w/ the pain Call with any questions or concerns Hang in there!

## 2011-08-21 LAB — CULTURE, ROUTINE-ABSCESS
Culture: NO GROWTH
Gram Stain: NONE SEEN

## 2011-09-04 ENCOUNTER — Emergency Department (HOSPITAL_BASED_OUTPATIENT_CLINIC_OR_DEPARTMENT_OTHER)
Admission: EM | Admit: 2011-09-04 | Discharge: 2011-09-04 | Disposition: A | Discharge status: Home / Self Care | Payer: Medicare Other | Attending: Emergency Medicine | Admitting: Emergency Medicine

## 2011-09-04 ENCOUNTER — Encounter (HOSPITAL_BASED_OUTPATIENT_CLINIC_OR_DEPARTMENT_OTHER): Payer: Self-pay | Admitting: Emergency Medicine

## 2011-09-04 DIAGNOSIS — K219 Gastro-esophageal reflux disease without esophagitis: Secondary | ICD-10-CM | POA: Insufficient documentation

## 2011-09-04 DIAGNOSIS — I10 Essential (primary) hypertension: Secondary | ICD-10-CM | POA: Insufficient documentation

## 2011-09-04 DIAGNOSIS — K1370 Unspecified lesions of oral mucosa: Secondary | ICD-10-CM | POA: Insufficient documentation

## 2011-09-04 DIAGNOSIS — Z79899 Other long term (current) drug therapy: Secondary | ICD-10-CM | POA: Insufficient documentation

## 2011-09-04 DIAGNOSIS — F17200 Nicotine dependence, unspecified, uncomplicated: Secondary | ICD-10-CM | POA: Insufficient documentation

## 2011-09-04 DIAGNOSIS — F341 Dysthymic disorder: Secondary | ICD-10-CM | POA: Insufficient documentation

## 2011-09-04 HISTORY — DX: Headache: R51

## 2011-09-04 MED ORDER — PENICILLIN V POTASSIUM 500 MG PO TABS: 500.0000 mg | ORAL_TABLET | Freq: Three times a day (TID) | ORAL | Status: AC

## 2011-09-04 MED ORDER — TRAMADOL HCL 50 MG PO TABS: 50.0000 mg | ORAL_TABLET | Freq: Four times a day (QID) | ORAL | Status: AC | PRN

## 2011-09-04 NOTE — ED Notes (Signed)
Pt reports abscess to roof of mouth,with pain radiation to the left side of the face. Pt also reports fever.

## 2011-09-04 NOTE — ED Provider Notes (Signed)
History     CSN: [id number] Arrival date & time: 09/04/2011  4:02 AM  No chief complaint on file.   (Consider location/radiation/quality/duration/timing/severity/associated sxs/prior treatment) HPI Comments: Patient notes a one to two-month history of a lesion on her left upper hard palate.  It started out as a white raised lesion that at times when she brushes it hard will drain pus.  She has associated radiation of pain to her left cheek and sinus region.  She describes intermittent fevers.  She notes that since the lesion began 1-2 months ago it has never completely gone away but will enlarge and then decrease in size.  She has no difficulty with swallowing, speech, breathing.  She has not noted any new dental issues.  Patient is a 40 y.o. female presenting with tooth pain. The history is provided by the patient. No language interpreter was used.  Dental PainThe primary symptoms include mouth pain and oral lesions. Primary symptoms do not include dental injury, oral bleeding, headaches, fever, shortness of breath, sore throat, angioedema or cough. The symptoms began more than 1 month ago. The symptoms are waxing and waning. The symptoms are chronic.    Past Medical History  Diagnosis Date  . Asthma   . Depression   . GERD (gastroesophageal reflux disease)   . HTN (hypertension)   . Allergic rhinitis   . Chronic neck pain     pain mgmt in [place]    Past Surgical History  Procedure Date  . Cholecystectomy 1993  . Chirai malformation 2003  . Laberal tear 2001    Right Shoulder  . Wisdom tooth extraction 1992    Family History  Problem Relation Age of Onset  . Alcohol abuse      parent, grandparent  . Arthritis      parent  . Breast cancer      parent <50  . Hyperlipidemia      parent  . Hypertension      parent    History  Substance Use Topics  . Smoking status: Current Everyday Smoker -- 1.0 packs/day    Types: Cigarettes  . Smokeless tobacco: Not on file  .  Alcohol Use: No    OB History    Grav Para Term Preterm Abortions TAB SAB Ect Mult Living                  Review of Systems  Constitutional: Negative.  Negative for fever and chills.  HENT: Negative for sore throat.   Eyes: Negative.  Negative for discharge and redness.  Respiratory: Negative.  Negative for cough and shortness of breath.   Cardiovascular: Negative.  Negative for chest pain.  Gastrointestinal: Negative.  Negative for nausea, vomiting, abdominal pain and diarrhea.  Genitourinary: Negative.  Negative for dysuria and vaginal discharge.  Musculoskeletal: Negative.  Negative for back pain.  Skin: Negative.  Negative for color change and rash.  Neurological: Negative.  Negative for syncope and headaches.  Hematological: Negative.  Negative for adenopathy.  Psychiatric/Behavioral: Negative.  Negative for confusion.  All other systems reviewed and are negative.    Allergies  Morphine  Home Medications   Current Outpatient Rx  Name Route Sig Dispense Refill  . ALBUTEROL SULFATE HFA 108 (90 BASE) MCG/ACT IN AERS Inhalation Inhale 2 puffs into the lungs every 4 (four) hours as needed for wheezing or shortness of breath. 1 Inhaler 3  . ALIGN PO CAPS Oral Take 1 capsule by mouth daily.      Marland Kitchen  CELECOXIB 200 MG PO CAPS Oral Take 200 mg by mouth 2 (two) times daily.      . CYCLOBENZAPRINE HCL 10 MG PO TABS Oral Take 10 mg by mouth 3 (three) times daily as needed. For muscle spasms.     Marland Kitchen FAMOTIDINE 20 MG PO TABS Oral Take 20 mg by mouth daily.      Marland Kitchen FLUTICASONE PROPIONATE 50 MCG/ACT NA SUSP  USE TWO SPRAY EACH NOSTRIL IN THE MORNING 16 g 2  . HYDROCODONE-ACETAMINOPHEN 10-325 MG PO TABS Oral Take 1 tablet by mouth every 8 (eight) hours as needed.      . IBUPROFEN 800 MG PO TABS Oral Take 800 mg by mouth every 8 (eight) hours as needed. For Pain.     Marland Kitchen TIZANIDINE HCL 6 MG PO CAPS Oral Take 6 mg by mouth 3 (three) times daily as needed.        There were no vitals taken for  this visit.  Physical Exam  Constitutional: She is oriented to person, place, and time. She appears well-developed and well-nourished.  HENT:  Head: Normocephalic and atraumatic.  Mouth/Throat: Oropharynx is clear and moist.       The patient's left upper hard palate there is a half centimeter area of white raised papules.  They have only mild tenderness to palpation.  They do not feel fluctuant.  There is no other swelling to the palate or peritonsillar region.  There is no tenderness around her gums for her upper or lower teeth and the sublingual tissues are soft.  Uvula is midline  Eyes: Conjunctivae and EOM are normal. Pupils are equal, round, and reactive to light.  Neck: Normal range of motion. Neck supple.  Pulmonary/Chest: Effort normal.  Musculoskeletal: Normal range of motion.  Lymphadenopathy:    She has no cervical adenopathy.  Neurological: She is alert and oriented to person, place, and time.  Skin: Skin is warm and dry. No rash noted. No erythema.  Psychiatric: She has a normal mood and affect. Her behavior is normal. Judgment and thought content normal.    ED Course  Procedures (including critical care time)  Labs Reviewed - No data to display No results found.   No diagnosis found.    MDM  Patient with possible infection on the hard palate.  Given the way she describes as I will place her on a ten-day course of penicillin.  Patient has been advised that if it is not improving she does need to seek followup with either her ENT physician or with the dentist for possible biopsy to determine a further course of action regarding this lesion.  Patient has been advised that if it's not resolving this could potentially be cancerous and again necessitates urgent followup.  She understands this and agrees to followup with her ENT doctor if this is not resolving or with an oral surgeon.        Nat Christen, MD 09/04/11 [phone]

## 2011-11-05 ENCOUNTER — Encounter: Payer: Self-pay | Admitting: Internal Medicine

## 2011-11-05 ENCOUNTER — Ambulatory Visit (INDEPENDENT_AMBULATORY_CARE_PROVIDER_SITE_OTHER): Payer: Medicare Other | Admitting: Internal Medicine

## 2011-11-05 VITALS — BP 118/88 | HR 94 | Temp 98.6°F | Wt 185.8 lb

## 2011-11-05 DIAGNOSIS — K047 Periapical abscess without sinus: Secondary | ICD-10-CM

## 2011-11-05 DIAGNOSIS — S025XXA Fracture of tooth (traumatic), initial encounter for closed fracture: Secondary | ICD-10-CM

## 2011-11-05 MED ORDER — CLINDAMYCIN HCL 300 MG PO CAPS: 300.0000 mg | ORAL_CAPSULE | Freq: Four times a day (QID) | ORAL | Status: AC

## 2011-11-05 NOTE — Progress Notes (Signed)
  Subjective:    [name], female    DOB: 02-11-1971, 40 y.o.   MRN: [id number]  HPI complains of mouth "abcess" Onset Located symptoms gradually worse Planned dental eval scheduled  PMH reviewed   Review of Systems     Objective:   Physical Exam BP 118/88  Pulse 94  Temp(Src) 98.6 F (37 C) (Oral)  Wt 185 lb 12.8 oz (84.278 kg)  SpO2 94% Wt Readings from Last 3 Encounters:  11/05/11 185 lb 12.8 oz (84.278 kg)  05/19/11 183 lb 4 oz (83.122 kg)  04/23/11 178 lb 12 oz (81.08 kg)   Constitutional: She appears well-developed and well-nourished. No distress.  HENT: Head: Normocephalic and atraumatic. No sinus pain to palpation Ears: B TMs ok, no erythema or effusion; Nose: Nose normal.  Mouth/Throat: top left incisor with caries, broken tooth and abscess, no active drainage - no mass Oropharynx is clear and moist. No oropharyngeal exudate.  Neck: Normal range of motion. Neck supple. No JVD present. No thyromegaly present.  Cardiovascular: Normal rate, regular rhythm and normal heart sounds.  No murmur heard. No BLE edema. Pulmonary/Chest: Effort normal and breath sounds normal. No respiratory distress. She has no wheezes.  Psychiatric: She has a normal mood and affect. Her behavior is normal. Judgment and thought content normal.   Lab Results  Component Value Date   WBC 13.2* 07/25/2010   HGB 13.9 07/25/2010   HCT 40.5 07/25/2010   PLT 263.0 07/25/2010       Assessment & Plan:  Dental caries - clinda x 10d, appt sched with dentist in 4 weeks - to call sooner if recurrent problem before dental problem

## 2011-11-05 NOTE — Patient Instructions (Signed)
It was good to see you today. Clindamycin antibiotics 4 times a day for 10 days - also refill provided to use if needed prior to your scheduled dental appointment in January

## 2012-02-06 ENCOUNTER — Ambulatory Visit (INDEPENDENT_AMBULATORY_CARE_PROVIDER_SITE_OTHER): Payer: Medicare Other | Admitting: Internal Medicine

## 2012-02-06 ENCOUNTER — Encounter: Payer: Self-pay | Admitting: Internal Medicine

## 2012-02-06 VITALS — BP 120/82 | HR 105 | Temp 98.6°F | Ht 62.0 in | Wt 184.8 lb

## 2012-02-06 DIAGNOSIS — IMO0002 Reserved for concepts with insufficient information to code with codable children: Secondary | ICD-10-CM

## 2012-02-06 DIAGNOSIS — M5416 Radiculopathy, lumbar region: Secondary | ICD-10-CM

## 2012-02-06 NOTE — Patient Instructions (Signed)
It was good to see you today. we'll make referral for MRI back - Our office will contact you regarding appointment(s) once made. You will then be called with results and plan Continue to follow up with your pain clinic in [facility] for now Medications reviewed, no changes at this time.
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
## 2012-02-09 ENCOUNTER — Ambulatory Visit (HOSPITAL_COMMUNITY)
Admission: RE | Admit: 2012-02-09 | Discharge: 2012-02-09 | Disposition: A | Discharge status: Home / Self Care | Payer: Medicare Other | Source: Ambulatory Visit | Attending: Internal Medicine | Admitting: Internal Medicine

## 2012-02-09 DIAGNOSIS — M79609 Pain in unspecified limb: Secondary | ICD-10-CM | POA: Insufficient documentation

## 2012-02-09 DIAGNOSIS — R109 Unspecified abdominal pain: Secondary | ICD-10-CM | POA: Insufficient documentation

## 2012-02-09 DIAGNOSIS — M5416 Radiculopathy, lumbar region: Secondary | ICD-10-CM

## 2012-02-09 DIAGNOSIS — M47817 Spondylosis without myelopathy or radiculopathy, lumbosacral region: Secondary | ICD-10-CM | POA: Insufficient documentation

## 2012-02-11 ENCOUNTER — Other Ambulatory Visit: Payer: Self-pay | Admitting: Internal Medicine

## 2012-02-11 DIAGNOSIS — M545 Low back pain, unspecified: Secondary | ICD-10-CM | POA: Insufficient documentation

## 2012-03-13 ENCOUNTER — Emergency Department (HOSPITAL_COMMUNITY)
Admission: EM | Admit: 2012-03-13 | Discharge: 2012-03-14 | Disposition: A | Discharge status: Home / Self Care | Payer: Medicare Other | Attending: Emergency Medicine | Admitting: Emergency Medicine

## 2012-03-13 DIAGNOSIS — K122 Cellulitis and abscess of mouth: Secondary | ICD-10-CM | POA: Insufficient documentation

## 2012-03-13 DIAGNOSIS — I10 Essential (primary) hypertension: Secondary | ICD-10-CM | POA: Insufficient documentation

## 2012-03-13 DIAGNOSIS — K029 Dental caries, unspecified: Secondary | ICD-10-CM | POA: Insufficient documentation

## 2012-03-13 DIAGNOSIS — F17200 Nicotine dependence, unspecified, uncomplicated: Secondary | ICD-10-CM | POA: Insufficient documentation

## 2012-03-14 ENCOUNTER — Encounter (HOSPITAL_COMMUNITY): Payer: Self-pay | Admitting: Emergency Medicine

## 2012-03-14 MED ORDER — CLINDAMYCIN HCL 300 MG PO CAPS: ORAL_CAPSULE | ORAL | Status: DC

## 2012-03-14 MED ORDER — OXYCODONE-ACETAMINOPHEN 5-325 MG PO TABS: 2.0000 | ORAL_TABLET | ORAL | Status: AC | PRN

## 2012-03-14 MED ORDER — CLINDAMYCIN HCL 150 MG PO CAPS: ORAL_CAPSULE | ORAL | Status: DC

## 2012-03-14 MED ORDER — CLINDAMYCIN HCL 300 MG PO CAPS
300.0000 mg | ORAL_CAPSULE | Freq: Once | ORAL | Status: AC
  Administered 2012-03-14: 300 mg via ORAL
  Filled 2012-03-14: qty 1

## 2012-03-14 MED ORDER — OXYCODONE-ACETAMINOPHEN 5-325 MG PO TABS
2.0000 | ORAL_TABLET | Freq: Once | ORAL | Status: AC
  Administered 2012-03-14: 2 via ORAL
  Filled 2012-03-14: qty 2

## 2012-03-14 NOTE — ED Notes (Signed)
Swelling noted to left side of face.

## 2012-03-14 NOTE — Discharge Instructions (Signed)
Dental Abscess A dental abscess usually starts from an infected tooth. Antibiotic medicine and pain pills can be helpful, but dental infections require the attention of a dentist. Rinse around the infected area often with salt water (a pinch of salt in 8 oz of warm water). Do not apply heat to the outside of your face. See your dentist or oral surgeon as soon as possible.  SEEK IMMEDIATE MEDICAL CARE IF:  You have increasing, severe pain that is not relieved by medicine.   You or your child has an oral temperature above 102 F (38.9 C), not controlled by medicine.   Your baby is older than 3 months with a rectal temperature of 102 F (38.9 C) or higher.   Your baby is 3 months old or younger with a rectal temperature of 100.4 F (38 C) or higher.   You develop chills, severe headache, difficulty breathing, or trouble swallowing.   You have swelling in the neck or around the eye.  Document Released: 11/05/2005 Document Revised: 10/25/2011 Document Reviewed: 04/16/2007 Exit[employer] Patient Information 2012 [employer]. 

## 2012-03-14 NOTE — ED Notes (Signed)
Pt with swelling noted to left side of face. Pt states history of "bad tooth". Pt states swelling started 1 day ago. No drooling noted.

## 2012-03-14 NOTE — ED Notes (Signed)
Pt continues to rate pain at 7

## 2012-03-14 NOTE — ED Provider Notes (Signed)
Medical screening examination/treatment/procedure(s) were performed by non-physician practitioner and as supervising physician I was immediately available for consultation/collaboration.  Jadynn Epping M Cristin Penaflor, MD 03/14/12 1641 

## 2012-03-14 NOTE — ED Notes (Signed)
Pt presented to the ER with c/o abscess to the roof of her mouth, noted facial swelling, upper lip as well, pt had procedure done, fistular tubal removal, in that process her sinuses were damaged, per pt, also pt reports that for the last couple of days she had nose bleeds.

## 2012-03-14 NOTE — ED Provider Notes (Signed)
History     CSN: [id number]  Arrival date & time 03/13/12  2338   First MD Initiated Contact with Patient 03/14/12 0316      Chief Complaint  Patient presents with  . Abscess  . Facial Swelling  . Facial Pain    (Consider location/radiation/quality/duration/timing/severity/associated sxs/prior treatment) Patient is a 41 y.o. female presenting with abscess. The history is provided by the patient. No language interpreter was used.  Abscess  This is a new problem. The current episode started today. The onset was gradual. The problem occurs continuously. The problem has been unchanged. The abscess is present on the face. The problem is severe. The abscess is characterized by painfulness. Pertinent negatives include no fever and no sore throat. There were no sick contacts. She has received no recent medical care.  Pt complains of an abscess to the top of her mouth.  Pt reports area has been draining tonight.  Pt reports her face began swelling today.  Pt complains of sinus problems.    Past Medical History  Diagnosis Date  . Asthma   . Depression   . GERD (gastroesophageal reflux disease)   . HTN (hypertension)   . Allergic rhinitis   . Chronic neck pain     pain mgmt in [place]  . Headache     Past Surgical History  Procedure Date  . Cholecystectomy 1993  . Chirai malformation 2003  . Laberal tear 2001    Right Shoulder  . Wisdom tooth extraction 1992    Family History  Problem Relation Age of Onset  . Alcohol abuse      parent, grandparent  . Arthritis      parent  . Breast cancer      parent <50  . Hyperlipidemia      parent  . Hypertension      parent    History  Substance Use Topics  . Smoking status: Current Everyday Smoker -- 1.0 packs/day    Types: Cigarettes  . Smokeless tobacco: Not on file  . Alcohol Use: No    OB History    Grav Para Term Preterm Abortions TAB SAB Ect Mult Living                  Review of Systems  Constitutional: Negative for  fever.  HENT: Positive for dental problem and sinus pressure. Negative for sore throat.   All other systems reviewed and are negative.    Allergies  Morphine and related  Home Medications   Current Outpatient Rx  Name Route Sig Dispense Refill  . CYCLOBENZAPRINE HCL 10 MG PO TABS Oral Take 10 mg by mouth 3 (three) times daily as needed. For muscle spasms.     Marland Kitchen FAMOTIDINE 20 MG PO TABS Oral Take 20 mg by mouth daily.      Marland Kitchen FLUTICASONE PROPIONATE 50 MCG/ACT NA SUSP  USE TWO SPRAY EACH NOSTRIL IN THE MORNING 16 g 2  . HYDROCODONE-ACETAMINOPHEN 10-325 MG PO TABS Oral Take 1 tablet by mouth every 8 (eight) hours as needed.      Marland Kitchen PROBIOTIC & ACIDOPHILUS EX ST PO Oral Take 1 tablet by mouth daily.    . ALBUTEROL SULFATE HFA 108 (90 BASE) MCG/ACT IN AERS Inhalation Inhale 2 puffs into the lungs every 4 (four) hours as needed for wheezing or shortness of breath. 1 Inhaler 3    BP 130/86  Temp(Src) 99.4 F (37.4 C) (Oral)  Resp 17  SpO2 95%  LMP 03/01/2012  Physical Exam  Nursing note and vitals reviewed. Constitutional: She appears well-developed and well-nourished.  HENT:  Head: Normocephalic.  Right Ear: External ear normal.  Left Ear: External ear normal.       Swollen draining area upper mid palate,  Poor dentition,  Multiple cavities and dental decay  Eyes: Pupils are equal, round, and reactive to light.  Neck: Normal range of motion. Neck supple.  Cardiovascular: Normal rate.   Pulmonary/Chest: Effort normal.  Abdominal: Soft.  Musculoskeletal: Normal range of motion.  Neurological: She is alert.  Skin: Skin is warm.  Psychiatric: She has a normal mood and affect.    ED Course  Procedures (including critical care time)  Labs Reviewed - No data to display No results found.   No diagnosis found.    MDM  Pt given clindamycin here and hydrocodone       Lonia Skinner [place], [place] 03/14/12 [phone]

## 2012-04-04 ENCOUNTER — Other Ambulatory Visit: Payer: Self-pay | Admitting: Internal Medicine

## 2013-02-02 ENCOUNTER — Emergency Department (HOSPITAL_COMMUNITY)
Admission: EM | Admit: 2013-02-02 | Discharge: 2013-02-02 | Disposition: A | Discharge status: Home / Self Care | Payer: Medicare Other | Attending: Emergency Medicine | Admitting: Emergency Medicine

## 2013-02-02 ENCOUNTER — Encounter (HOSPITAL_COMMUNITY): Payer: Self-pay | Admitting: *Deleted

## 2013-02-02 DIAGNOSIS — I10 Essential (primary) hypertension: Secondary | ICD-10-CM | POA: Insufficient documentation

## 2013-02-02 DIAGNOSIS — Z8709 Personal history of other diseases of the respiratory system: Secondary | ICD-10-CM | POA: Insufficient documentation

## 2013-02-02 DIAGNOSIS — J45909 Unspecified asthma, uncomplicated: Secondary | ICD-10-CM | POA: Insufficient documentation

## 2013-02-02 DIAGNOSIS — Z8659 Personal history of other mental and behavioral disorders: Secondary | ICD-10-CM | POA: Insufficient documentation

## 2013-02-02 DIAGNOSIS — G8929 Other chronic pain: Secondary | ICD-10-CM | POA: Insufficient documentation

## 2013-02-02 DIAGNOSIS — Z79899 Other long term (current) drug therapy: Secondary | ICD-10-CM | POA: Insufficient documentation

## 2013-02-02 DIAGNOSIS — F17200 Nicotine dependence, unspecified, uncomplicated: Secondary | ICD-10-CM | POA: Insufficient documentation

## 2013-02-02 DIAGNOSIS — R6883 Chills (without fever): Secondary | ICD-10-CM | POA: Insufficient documentation

## 2013-02-02 DIAGNOSIS — K219 Gastro-esophageal reflux disease without esophagitis: Secondary | ICD-10-CM | POA: Insufficient documentation

## 2013-02-02 DIAGNOSIS — G43909 Migraine, unspecified, not intractable, without status migrainosus: Secondary | ICD-10-CM | POA: Insufficient documentation

## 2013-02-02 DIAGNOSIS — R110 Nausea: Secondary | ICD-10-CM | POA: Insufficient documentation

## 2013-02-02 DIAGNOSIS — M549 Dorsalgia, unspecified: Secondary | ICD-10-CM | POA: Insufficient documentation

## 2013-02-02 DIAGNOSIS — M542 Cervicalgia: Secondary | ICD-10-CM | POA: Insufficient documentation

## 2013-02-02 DIAGNOSIS — IMO0002 Reserved for concepts with insufficient information to code with codable children: Secondary | ICD-10-CM | POA: Insufficient documentation

## 2013-02-02 MED ORDER — PROMETHAZINE HCL 25 MG/ML IJ SOLN
25.0000 mg | Freq: Once | INTRAMUSCULAR | Status: AC
  Administered 2013-02-02: 25 mg via INTRAMUSCULAR
  Filled 2013-02-02 (×2): qty 1

## 2013-02-02 MED ORDER — HYDROMORPHONE HCL PF 2 MG/ML IJ SOLN
2.0000 mg | Freq: Once | INTRAMUSCULAR | Status: AC
  Administered 2013-02-02: 2 mg via INTRAVENOUS
  Filled 2013-02-02: qty 1

## 2013-02-02 NOTE — ED Provider Notes (Signed)
History     CSN: [id number]  Arrival date & time 02/02/13  0132   First MD Initiated Contact with Patient 02/02/13 0208      Chief Complaint  Patient presents with  . Migraine   HPI  History provided by the patient and husband. Patient is a 42 year old female with history of hypertension, migraine headaches, chronic neck and back pain under pain management who presents with complaints of unimproved typical migraine headache. Symptoms first began Friday and Saturday but has been constant and worsening into Sunday. Patient has used her prescribed pain medications without any improvement of symptoms. Headache is located on bilateral head and radiates into the back and neck. Symptoms are associated with photophobia and nausea. Denies any episodes of vomiting. No fever. She denies any other aggravating or alleviating factors. No other associated symptoms.    Past Medical History  Diagnosis Date  . Asthma   . Depression   . GERD (gastroesophageal reflux disease)   . HTN (hypertension)   . Allergic rhinitis   . Chronic neck pain     pain mgmt in [place]  . Headache     Past Surgical History  Procedure Laterality Date  . Cholecystectomy  1993  . Chirai malformation  2003  . Laberal tear  2001    Right Shoulder  . Wisdom tooth extraction  1992    Family History  Problem Relation Age of Onset  . Alcohol abuse      parent, grandparent  . Arthritis      parent  . Breast cancer      parent <50  . Hyperlipidemia      parent  . Hypertension      parent    History  Substance Use Topics  . Smoking status: Current Every Day Smoker -- 1.00 packs/day    Types: Cigarettes  . Smokeless tobacco: Not on file  . Alcohol Use: No    OB History   Grav Para Term Preterm Abortions TAB SAB Ect Mult Living                  Review of Systems  Constitutional: Positive for chills. Negative for fever and unexpected weight change.  HENT: Negative for congestion, rhinorrhea and sinus  pressure.   Eyes: Positive for photophobia. Negative for pain and visual disturbance.  Gastrointestinal: Positive for nausea. Negative for vomiting.  Neurological: Positive for headaches. Negative for speech difficulty, weakness and numbness.  Psychiatric/Behavioral: Negative for confusion.  All other systems reviewed and are negative.    Allergies  Morphine and related  Home Medications   Current Outpatient Rx  Name  Route  Sig  Dispense  Refill  . EXPIRED: albuterol (PROVENTIL HFA;VENTOLIN HFA) 108 (90 BASE) MCG/ACT inhaler   Inhalation   Inhale 2 puffs into the lungs every 4 (four) hours as needed for wheezing or shortness of breath.   1 Inhaler   3   . clindamycin (CLEOCIN) 300 MG capsule      One po qid   28 capsule   0   . cyclobenzaprine (FLEXERIL) 10 MG tablet   Oral   Take 10 mg by mouth 3 (three) times daily as needed. For muscle spasms.          . famotidine (PEPCID) 20 MG tablet   Oral   Take 20 mg by mouth daily.           . fluticasone (FLONASE) 50 MCG/ACT nasal spray      USE  TWO SPRAY EACH NOSTRIL IN THE MORNING   16 g   2   . HYDROcodone-acetaminophen (NORCO) 10-325 MG per tablet   Oral   Take 1 tablet by mouth every 8 (eight) hours as needed.           . Probiotic Product (PROBIOTIC & ACIDOPHILUS EX ST PO)   Oral   Take 1 tablet by mouth daily.           BP 149/95  Pulse 91  Temp(Src) 99.2 F (37.3 C) (Oral)  Resp 18  SpO2 100%  LMP 01/26/2013  Physical Exam  Nursing note and vitals reviewed. Constitutional: She is oriented to person, place, and time. She appears well-developed and well-nourished. No distress.  HENT:  Head: Normocephalic and atraumatic.  Eyes: Conjunctivae and EOM are normal. Pupils are equal, round, and reactive to light.  Neck: Normal range of motion. Neck supple.  No meningeal signs  Cardiovascular: Normal rate and regular rhythm.   No murmur heard. Pulmonary/Chest: Effort normal and breath sounds  normal. No respiratory distress. She has no wheezes. She has no rales.  Abdominal: Soft. There is no tenderness. There is no rigidity, no rebound, no guarding, no CVA tenderness and no tenderness at McBurney's point.  Musculoskeletal: Normal range of motion.  Neurological: She is alert and oriented to person, place, and time. She has normal strength. No cranial nerve deficit or sensory deficit. Gait normal.  Skin: Skin is warm and dry. No rash noted.  Psychiatric: She has a normal mood and affect. Her behavior is normal.    ED Course  Procedures      1. Migraine headache       MDM  2:15AM patient seen and evaluated. Patient appears uncomfortable but in no acute distress. Symptoms are typical of her regular migraine type headaches. She reports having 2-3 a month. She has normal nonfocal neuro exam. No concerning changes are red flag symptoms.   Patient now reports having significant improvement of headache after medications. She states she feels ready to return home.     Angus Seller, PA-C 02/02/13 [phone]

## 2013-02-02 NOTE — ED Provider Notes (Signed)
Medical screening examination/treatment/procedure(s) were performed by non-physician practitioner and as supervising physician I was immediately available for consultation/collaboration.  Ethelda Chick, MD 02/02/13 [phone]

## 2013-02-02 NOTE — ED Notes (Signed)
Pt reports migraine HA that began x2 days ago w/ associated nausea - pt also states she has recently been experiencing hypertension (170/95 yesterday) pt is not being currently treated for hypertension however has been asked by her PCP to keep a journal of her BP. Pt also c/o photophobia and blurred vision.

## 2013-03-27 ENCOUNTER — Ambulatory Visit (INDEPENDENT_AMBULATORY_CARE_PROVIDER_SITE_OTHER): Payer: Medicare Other | Admitting: Internal Medicine

## 2013-03-27 ENCOUNTER — Encounter: Payer: Self-pay | Admitting: Internal Medicine

## 2013-03-27 VITALS — BP 122/80 | HR 96 | Temp 98.4°F | Wt 175.8 lb

## 2013-03-27 DIAGNOSIS — J0100 Acute maxillary sinusitis, unspecified: Secondary | ICD-10-CM

## 2013-03-27 DIAGNOSIS — K047 Periapical abscess without sinus: Secondary | ICD-10-CM

## 2013-03-27 DIAGNOSIS — I10 Essential (primary) hypertension: Secondary | ICD-10-CM

## 2013-03-27 MED ORDER — PROMETHAZINE-CODEINE 6.25-10 MG/5ML PO SYRP: 5.0000 mL | ORAL_SOLUTION | ORAL | Status: DC | PRN

## 2013-03-27 MED ORDER — LOSARTAN POTASSIUM 50 MG PO TABS: 50.0000 mg | ORAL_TABLET | Freq: Every day | ORAL | Status: DC

## 2013-03-27 MED ORDER — LEVOFLOXACIN 500 MG PO TABS: 500.0000 mg | ORAL_TABLET | Freq: Every day | ORAL | Status: DC

## 2013-03-27 MED ORDER — LORATADINE 10 MG PO TABS: 10.0000 mg | ORAL_TABLET | Freq: Every day | ORAL | Status: DC

## 2013-03-27 NOTE — Progress Notes (Signed)
Subjective:    [name], female    DOB: June 28, 1971, 42 y.o.   MRN: [id number]  HPI  Emily [name] presents to the office today with facial pain, HA, and congestion x 2 days.  Patient states that she has had problems with her sinuses over the past week, but tried taking flonase and severe sinus and headache but has continued to get worse.  Patient states that she has had sinus infections in the past, and this feels very similar.  She denies fever, chills, nausea, and vomiting.    Patient also states that she has a bad tooth which may be infected.  She would like a referral to a dentist so that she can get the tooth pulled.  She denies swelling, redness, or drainage of the gums.  Patient has been keeping a diary of her BP.  She says it has been running high this month.  She runs anywhere from 120-150/80-96.       Review of Systems  Constitutional: Positive for fatigue. Negative for fever and chills.  HENT: Positive for congestion, facial swelling, dental problem, postnasal drip and sinus pressure. Negative for ear pain and trouble swallowing.   Respiratory: Negative for chest tightness and shortness of breath.   Cardiovascular: Negative for chest pain.  Gastrointestinal: Negative for nausea and vomiting.  All other systems reviewed and are negative.       Objective:   Physical Exam  Nursing note and vitals reviewed. Constitutional: She is oriented to person, place, and time. She appears well-developed and well-nourished. No distress.  HENT:  Head: Normocephalic and atraumatic.  Right Ear: External ear normal. A middle ear effusion is present.  Left Ear: External ear normal. A middle ear effusion is present.  Nose: Mucosal edema present. Right sinus exhibits maxillary sinus tenderness. Right sinus exhibits no frontal sinus tenderness. Left sinus exhibits maxillary sinus tenderness. Left sinus exhibits no frontal sinus tenderness.  Mouth/Throat: Posterior oropharyngeal  erythema present.  Broken front left incisor.    Eyes: Conjunctivae are normal. No scleral icterus.  Neck: Normal range of motion. Neck supple.  Cardiovascular: Normal rate, regular rhythm and normal heart sounds.  Exam reveals no gallop and no friction rub.   No murmur heard. Pulmonary/Chest: Effort normal and breath sounds normal. No respiratory distress. She has no wheezes. She has no rales. She exhibits no tenderness.  Lymphadenopathy:    She has no cervical adenopathy.  Neurological: She is alert and oriented to person, place, and time.  Skin: Skin is warm and dry. She is not diaphoretic.  Psychiatric: She has a normal mood and affect. Her behavior is normal.    Filed Vitals:   03/27/13 0816  BP: 122/80  Pulse: 96  Temp: 98.4 F (36.9 C)  TempSrc: Oral  Weight: 175 lb 12.8 oz (79.742 kg)  SpO2: 96%        Assessment & Plan:  Patient presents to the office today with sinus problems  1.  Sinus pain:  Will start Levofloxacin 500 mg po qd for sinus infection x 7 days.  Will start on Clariten 10 mg po qd, and will prescribe a promethazine codeine syrup as need for cough.  2.  Tooth pain:  Will refer to the dentist today.    3.  Blood Pressure:  Will start ARB losarten 50 mg po qd.  Continue to take BP diary.  Will recheck in 4-6 weeks.  Patient given strict return precatuions.  Will see back in 4-6  weeks, sooner if sinus problems don't resolve or if symptoms worsen.    I have personally reviewed this case with PA student. I also personally examined this patient. I agree with history and findings as documented above. I reviewed, discussed and approve of the assessment and plan as listed above. Rene Paci, MD

## 2013-03-27 NOTE — Assessment & Plan Note (Signed)
Home BP log reviewed -uncontrolled, exacerbated by pain Start losartan 50 mg today Recheck blood pressure in 4-6 weeks

## 2013-03-27 NOTE — Progress Notes (Signed)
  Subjective:    HPI  See CC and PA student note  Past Medical History  Diagnosis Date  . Asthma   . Depression   . GERD (gastroesophageal reflux disease)   . HTN (hypertension)   . Allergic rhinitis   . Chronic neck pain     pain mgmt in [place]  . Headache     Review of Systems Constitutional: No night sweats, no unexpected weight change Pulmonary: No pleurisy or hemoptysis Cardiovascular: No chest pain or palpitations     Objective:   Physical Exam BP 122/80  Pulse 96  Temp(Src) 98.4 F (36.9 C) (Oral)  Wt 175 lb 12.8 oz (79.742 kg)  BMI 32.15 kg/m2  SpO2 96% GEN: mildly ill appearing and fatigued HENT: NCAT, mild sinus tenderness L maxillary, nares with clear discharge and turbinate swelling, broken left front incisor with abscess, oropharynx mild erythema and PND, no exudate Eyes: Vision grossly intact, no conjunctivitis Lungs: Clear to auscultation without rhonchi or wheeze, no increased work of breathing Cardiovascular: Regular rate and rhythm, no bilateral edema  Lab Results  Component Value Date   WBC 13.2* 07/25/2010   HGB 13.9 07/25/2010   HCT 40.5 07/25/2010   PLT 263.0 07/25/2010      Assessment & Plan:  Broken tooth with abscess (front left incisor) > progression to acute maxillary sinusitis allergic rhinitis    Levaquin x 10d Refer to dentist prometh with codeine Claritin and continue flonase  Prescription cough suppression - new prescriptions done Symptomatic care with Tylenol or Advil, decongestants, antihistamine, hydration and rest -  Saline irrigation and salt gargle advised as needed

## 2013-03-27 NOTE — Patient Instructions (Signed)
It was good to see you today. Levaquin antibiotics daily x 10days - Promethazine with codiene syrup for pain and congestion, claritin daily Refer to dentist for infection as discussed Start losartan for blood pressure control once daily Your prescription(s) have been submitted to your pharmacy. Please take as directed and contact our office if you believe you are having problem(s) with the medication(s). Continue Flonase spray Return here for blood pressure recheck in next 4-6 weeks, call sooner if problems

## 2013-04-23 ENCOUNTER — Ambulatory Visit: Payer: Medicare Other | Admitting: Internal Medicine

## 2013-05-15 ENCOUNTER — Ambulatory Visit: Payer: Medicare Other | Admitting: Internal Medicine

## 2013-07-24 ENCOUNTER — Telehealth: Payer: Self-pay | Admitting: *Deleted

## 2013-07-24 ENCOUNTER — Other Ambulatory Visit: Payer: Self-pay | Admitting: *Deleted

## 2013-07-24 MED ORDER — FLUTICASONE PROPIONATE 50 MCG/ACT NA SUSP: NASAL | Status: DC

## 2013-07-24 MED ORDER — ALBUTEROL SULFATE HFA 108 (90 BASE) MCG/ACT IN AERS: 2.0000 | INHALATION_SPRAY | RESPIRATORY_TRACT | Status: DC | PRN

## 2013-07-24 MED ORDER — ALBUTEROL SULFATE HFA 108 (90 BASE) MCG/ACT IN AERS: 2.0000 | INHALATION_SPRAY | Freq: Four times a day (QID) | RESPIRATORY_TRACT | Status: DC | PRN

## 2013-07-24 NOTE — Telephone Encounter (Signed)
Received fax stating pt insurance does not cover albuterol inhaler. Insurance will cover Emily [name] is it ok to change?...Raechel Chute

## 2013-07-24 NOTE — Telephone Encounter (Signed)
yes

## 2013-09-25 ENCOUNTER — Other Ambulatory Visit: Payer: Self-pay | Admitting: Rehabilitation

## 2013-09-25 DIAGNOSIS — M542 Cervicalgia: Secondary | ICD-10-CM

## 2013-10-05 ENCOUNTER — Ambulatory Visit
Admission: RE | Admit: 2013-10-05 | Discharge: 2013-10-05 | Disposition: A | Discharge status: Home / Self Care | Payer: Medicare Other | Source: Ambulatory Visit | Attending: Rehabilitation | Admitting: Rehabilitation

## 2013-10-05 DIAGNOSIS — M542 Cervicalgia: Secondary | ICD-10-CM

## 2013-10-09 ENCOUNTER — Telehealth: Payer: Self-pay

## 2013-10-09 NOTE — Telephone Encounter (Signed)
The nurse called wanting to relay MRI results to the nurse.  I asked her if she would prefer to fax them, and she said no, that she wanted to speak with the nurse via a phone call.  Callback [phone] Efraim Kaufmann)

## 2013-10-12 NOTE — Telephone Encounter (Signed)
Tried calling Emily [name] back @ [phone] @ [facility] no one will pick phone up. Just rings...lmb

## 2013-10-12 NOTE — Telephone Encounter (Signed)
Noted - I have already sent the copy to scanning- I will keep posted and review once scanned into EMR thanks

## 2013-10-12 NOTE — Telephone Encounter (Signed)
Called Emily [name] back she just wanted to inform us that they fax over the MRI report along with the thyroid results attach...lmb

## 2013-10-14 ENCOUNTER — Telehealth: Payer: Self-pay | Admitting: Internal Medicine

## 2013-10-14 NOTE — Telephone Encounter (Signed)
Rec'd from [facility] forward 4 pages to Dr.Leschber

## 2013-10-20 ENCOUNTER — Telehealth: Payer: Self-pay | Admitting: Internal Medicine

## 2013-10-20 DIAGNOSIS — E041 Nontoxic single thyroid nodule: Secondary | ICD-10-CM

## 2013-10-20 NOTE — Telephone Encounter (Signed)
Please call pt -  I have ordered tyroid [place] to follow up on small nodule notes on left side of thyroid during recent MRI of neck [facility] will call with apt - then we will contact with results after review No tx changes recommended at this  thanks

## 2013-10-20 NOTE — Telephone Encounter (Signed)
Notified pt with md response.../lmb 

## 2013-10-23 ENCOUNTER — Ambulatory Visit
Admission: RE | Admit: 2013-10-23 | Discharge: 2013-10-23 | Disposition: A | Discharge status: Home / Self Care | Payer: Medicare Other | Source: Ambulatory Visit | Attending: Internal Medicine | Admitting: Internal Medicine

## 2013-10-23 ENCOUNTER — Other Ambulatory Visit: Payer: Self-pay | Admitting: Internal Medicine

## 2013-10-23 DIAGNOSIS — E041 Nontoxic single thyroid nodule: Secondary | ICD-10-CM

## 2013-10-26 LAB — HM PAP SMEAR

## 2013-10-28 ENCOUNTER — Ambulatory Visit (INDEPENDENT_AMBULATORY_CARE_PROVIDER_SITE_OTHER): Payer: Medicare Other | Admitting: Endocrinology

## 2013-10-28 ENCOUNTER — Encounter: Payer: Self-pay | Admitting: Endocrinology

## 2013-10-28 VITALS — BP 130/84 | HR 86 | Temp 98.4°F | Ht 60.0 in | Wt 167.0 lb

## 2013-10-28 DIAGNOSIS — E041 Nontoxic single thyroid nodule: Secondary | ICD-10-CM

## 2013-10-28 LAB — TSH: TSH: 0.15 u[IU]/mL — ABNORMAL LOW (ref 0.35–5.50)

## 2013-10-28 NOTE — Patient Instructions (Signed)
blood tests are being requested for you today.  We'll contact you with results.  If it is overactive: We would check a thyroid "scan" (a special, but easy and painless type of thyroid x ray).  It works like this: you go to the x-ray department of the hospital to swallow a pill, which contains a miniscule amount of radiation.  You will not notice any symptoms from this.  You will go back to the x-ray department the next day, to lie down in front of a camera.  The results of this will be sent to me.   Based on the results, i hope to order for you a treatment pill of radioactive iodine.  Although it is a larger amount of radiation, you will again notice no symptoms from this.  The pill is gone from your body in a few days (during which you should stay away from other people), but takes several months to work.  Therefore, please return here approximately 6-8 weeks after the treatment.  This treatment has been available for many years, and the only known side-effect is an underactive thyroid.  It is possible that i would eventually prescribe for you a thyroid hormone pill, which is very inexpensive.  You don't have to worry about side-effects of this thyroid hormone pill, because it is the same molecule your thyroid makes.  If it is normal or underactive, please come back for a biopsy.

## 2013-10-28 NOTE — Progress Notes (Signed)
Subjective:    [name], female    DOB: 09-17-1971, 42 y.o.   MRN: [id number]  HPI In mid-2014, pt had MRI of the neck, and was incidentally noted to have a moderate nodule at the left thyroid lobe.  No assoc pain.   Past Medical History  Diagnosis Date  . Asthma   . Depression   . GERD (gastroesophageal reflux disease)   . HTN (hypertension)   . Allergic rhinitis   . Chronic neck pain     pain mgmt in [place]  . [id number](784.6)     Past Surgical History  Procedure Laterality Date  . Cholecystectomy  1993  . Chirai malformation  2003  . Laberal tear  2001    Right Shoulder  . Wisdom tooth extraction  1992    History   Social History  . Marital Status: Divorced    Spouse Name: N/A    Number of Children: N/A  . Years of Education: N/A   Occupational History  . Not on file.   Social History Main Topics  . Smoking status: Current Every Day Smoker -- 1.00 packs/day    Types: Cigarettes  . Smokeless tobacco: Not on file  . Alcohol Use: No  . Drug Use: No  . Sexual Activity: Not on file   Other Topics Concern  . Not on file   Social History Narrative   Lives w/fiance and daughter.   On disability.    Current Outpatient Prescriptions on File Prior to Visit  Medication Sig Dispense Refill  . albuterol (PROAIR HFA) 108 (90 BASE) MCG/ACT inhaler Inhale 2 puffs into the lungs every 6 (six) hours as needed for wheezing.  18 g  1  . clindamycin (CLEOCIN) 300 MG capsule One po qid  28 capsule  0  . cyclobenzaprine (FLEXERIL) 10 MG tablet Take 10 mg by mouth 3 (three) times daily as needed. For muscle spasms.       . famotidine (PEPCID) 20 MG tablet Take 20 mg by mouth daily.        . fluticasone (FLONASE) 50 MCG/ACT nasal spray USE TWO SPRAY EACH NOSTRIL IN THE MORNING  16 g  2  . HYDROcodone-acetaminophen (NORCO) 10-325 MG per tablet Take 1 tablet by mouth every 8 (eight) hours as needed.        Marland Kitchen levofloxacin (LEVAQUIN) 500 MG tablet Take 1 tablet  (500 mg total) by mouth daily.  10 tablet  0  . loratadine (CLARITIN) 10 MG tablet Take 1 tablet (10 mg total) by mouth daily.  30 tablet  11  . losartan (COZAAR) 50 MG tablet Take 1 tablet (50 mg total) by mouth daily.  90 tablet  3  . Probiotic Product (PROBIOTIC & ACIDOPHILUS EX ST PO) Take 1 tablet by mouth daily.      . promethazine-codeine (PHENERGAN WITH CODEINE) 6.25-10 MG/5ML syrup Take 5 mLs by mouth every 4 (four) hours as needed for cough.  180 mL  0   No current facility-administered medications on file prior to visit.    Allergies  Allergen Reactions  . Morphine And Related     Lowers BP too much    Family History  Problem Relation Age of Onset  . Alcohol abuse      parent, grandparent  . Arthritis      parent  . Breast cancer      parent <50  . Hyperlipidemia      parent  . Hypertension  parent  mother had hyperthyroidism, due to a nodular goiter  BP 130/84  Pulse 86  Temp(Src) 98.4 F (36.9 C) (Oral)  Ht 5' (1.524 m)  Wt 167 lb (75.751 kg)  BMI 32.62 kg/m2  SpO2 97%  LMP 09/28/2013    Review of Systems denies weight loss, hoarseness, double vision, palpitations, sob, diarrhea, polyuria, myalgias, excessive diaphoresis, numbness, tremor, easy bruising, and rhinorrhea.  No change in chronic intermittent migraine.  She has anxiety and depression.  She has regular menses.    Objective:   Physical Exam VS: see vs page GEN: no distress HEAD: head: no deformity eyes: no periorbital swelling, no proptosis external nose and ears are normal mouth: no lesion seen NECK: i can feel the nodule noted on [place]. CHEST WALL: no deformity LUNGS:  Clear to auscultation CV: reg rate and rhythm, no murmur ABD: abdomen is soft, nontender.  no hepatosplenomegaly.  not distended.  no hernia.  Old healed surgical scar.  MUSCULOSKELETAL: muscle bulk and strength are grossly normal.  no obvious joint swelling.  gait is normal and steady EXTEMITIES: no deformity.  no  edema PULSES: dorsalis pedis intact bilat.  no carotid bruit NEURO:  cn 2-12 grossly intact.   readily moves all 4's.  sensation is intact to touch on all 4's SKIN:  Normal texture and temperature.  No rash or suspicious lesion is visible.   NODES:  None palpable at the neck PSYCH: alert, oriented x3.  Does not appear anxious nor depressed.  Lab Results  Component Value Date   TSH 0.15* 10/28/2013  (i reviewed thyroid US result)    Assessment & Plan:  Multinodular goiter, which is usually hereditary.  There is 1 dominant nodule Hyperthyroidism: due tot the nodule. Anxiety/depression: unlikely thyroid-related

## 2013-11-04 ENCOUNTER — Encounter: Payer: Self-pay | Admitting: Internal Medicine

## 2013-11-04 ENCOUNTER — Other Ambulatory Visit (INDEPENDENT_AMBULATORY_CARE_PROVIDER_SITE_OTHER): Payer: Medicare Other

## 2013-11-04 ENCOUNTER — Ambulatory Visit (INDEPENDENT_AMBULATORY_CARE_PROVIDER_SITE_OTHER): Payer: Medicare Other | Admitting: Internal Medicine

## 2013-11-04 VITALS — BP 132/80 | HR 80 | Temp 99.0°F | Ht 62.0 in | Wt 171.6 lb

## 2013-11-04 DIAGNOSIS — F3289 Other specified depressive episodes: Secondary | ICD-10-CM

## 2013-11-04 DIAGNOSIS — I10 Essential (primary) hypertension: Secondary | ICD-10-CM

## 2013-11-04 DIAGNOSIS — Z0000 Encounter for general adult medical examination without abnormal findings: Secondary | ICD-10-CM

## 2013-11-04 DIAGNOSIS — F17200 Nicotine dependence, unspecified, uncomplicated: Secondary | ICD-10-CM

## 2013-11-04 DIAGNOSIS — Z23 Encounter for immunization: Secondary | ICD-10-CM

## 2013-11-04 DIAGNOSIS — E041 Nontoxic single thyroid nodule: Secondary | ICD-10-CM

## 2013-11-04 DIAGNOSIS — F329 Major depressive disorder, single episode, unspecified: Secondary | ICD-10-CM

## 2013-11-04 DIAGNOSIS — Z1239 Encounter for other screening for malignant neoplasm of breast: Secondary | ICD-10-CM

## 2013-11-04 LAB — CBC WITH DIFFERENTIAL/PLATELET
Basophils Absolute: 0 10*3/uL (ref 0.0–0.1)
Basophils Relative: 0.4 % (ref 0.0–3.0)
Eosinophils Absolute: 0.2 10*3/uL (ref 0.0–0.7)
Eosinophils Relative: 2.2 % (ref 0.0–5.0)
HCT: 36.1 % (ref 36.0–46.0)
Hemoglobin: 11.7 g/dL — ABNORMAL LOW (ref 12.0–15.0)
Lymphocytes Relative: 32.4 % (ref 12.0–46.0)
Lymphs Abs: 3.4 10*3/uL (ref 0.7–4.0)
MCHC: 32.3 g/dL (ref 30.0–36.0)
MCV: 80.4 fl (ref 78.0–100.0)
Monocytes Absolute: 1.2 10*3/uL — ABNORMAL HIGH (ref 0.1–1.0)
Monocytes Relative: 11.4 % (ref 3.0–12.0)
Neutro Abs: 5.6 10*3/uL (ref 1.4–7.7)
Neutrophils Relative %: 53.6 % (ref 43.0–77.0)
Platelets: 303 10*3/uL (ref 150.0–400.0)
RBC: 4.49 Mil/uL (ref 3.87–5.11)
RDW: 19.9 % — ABNORMAL HIGH (ref 11.5–14.6)
WBC: 10.4 10*3/uL (ref 4.5–10.5)

## 2013-11-04 LAB — HEPATIC FUNCTION PANEL
ALT: 15 U/L (ref 0–35)
AST: 19 U/L (ref 0–37)
Albumin: 4.3 g/dL (ref 3.5–5.2)
Alkaline Phosphatase: 79 U/L (ref 39–117)
Bilirubin, Direct: 0.1 mg/dL (ref 0.0–0.3)
Total Bilirubin: 0.5 mg/dL (ref 0.3–1.2)
Total Protein: 7.2 g/dL (ref 6.0–8.3)

## 2013-11-04 LAB — BASIC METABOLIC PANEL
BUN: 3 mg/dL — ABNORMAL LOW (ref 6–23)
CO2: 28 mEq/L (ref 19–32)
Calcium: 9 mg/dL (ref 8.4–10.5)
Chloride: 104 mEq/L (ref 96–112)
Creatinine, Ser: 0.6 mg/dL (ref 0.4–1.2)
GFR: 132.95 mL/min (ref >60.00)
Glucose, Bld: 99 mg/dL (ref 70–99)
Potassium: 3.6 mEq/L (ref 3.5–5.1)
Sodium: 139 mEq/L (ref 135–145)

## 2013-11-04 LAB — LIPID PANEL
Cholesterol: 152 mg/dL (ref 0–200)
HDL: 23.6 mg/dL — ABNORMAL LOW (ref >39.00)
Total CHOL/HDL Ratio: 6
Triglycerides: 394 mg/dL — ABNORMAL HIGH (ref 0.0–149.0)
VLDL: 78.8 mg/dL — ABNORMAL HIGH (ref 0.0–40.0)

## 2013-11-04 LAB — LDL CHOLESTEROL, DIRECT: Direct LDL: 77.8 mg/dL

## 2013-11-04 NOTE — Patient Instructions (Addendum)
It was good to see you today.  We have reviewed your prior records including labs and tests today  Your annual flu shot was given and/or updated today.  Test(s) ordered today. Your results will be released to MyChart (or called to you) after review, usually within 72hours after test completion. If any changes need to be made, you will be notified at that same time.  Medications reviewed and updated, no changes recommended at this time.  we'll make referral for mammogram screening. Our office will contact you regarding appointment(s) once made.  Continue working with your other specialists as ongoing  Continue to think about giving up cigarettes! Use nicotine gum, nicotine patches or electronic cigarettes. If you're interested in medication to help you quit, please call  - let me know how I can help!  Please schedule followup in 12 months for annual exam, call sooner if problems.  Health Maintenance, Female A healthy lifestyle and preventative care can promote health and wellness.  Maintain regular health, dental, and eye exams.  Eat a healthy diet. Foods like vegetables, fruits, whole grains, low-fat dairy products, and lean protein foods contain the nutrients you need without too many calories. Decrease your intake of foods high in solid fats, added sugars, and salt. Get information about a proper diet from your caregiver, if necessary.  Regular physical exercise is one of the most important things you can do for your health. Most adults should get at least 150 minutes of moderate-intensity exercise (any activity that increases your heart rate and causes you to sweat) each week. In addition, most adults need muscle-strengthening exercises on 2 or more days a week.   Maintain a healthy weight. The body mass index (BMI) is a screening tool to identify possible weight problems. It provides an estimate of body fat based on height and weight. Your caregiver can help determine your BMI, and  can help you achieve or maintain a healthy weight. For adults 20 years and older:  A BMI below 18.5 is considered underweight.  A BMI of 18.5 to 24.9 is normal.  A BMI of 25 to 29.9 is considered overweight.  A BMI of 30 and above is considered obese.  Maintain normal blood lipids and cholesterol by exercising and minimizing your intake of saturated fat. Eat a balanced diet with plenty of fruits and vegetables. Blood tests for lipids and cholesterol should begin at age 46 and be repeated every 5 years. If your lipid or cholesterol levels are high, you are over 50, or you are a high risk for heart disease, you may need your cholesterol levels checked more frequently.Ongoing high lipid and cholesterol levels should be treated with medicines if diet and exercise are not effective.  If you smoke, find out from your caregiver how to quit. If you do not use tobacco, do not start.  Lung cancer screening is recommended for adults aged 61 80 years who are at high risk for developing lung cancer because of a history of smoking. Yearly low-dose computed tomography (CT) is recommended for people who have at least a 30-pack-year history of smoking and are a current smoker or have quit within the past 15 years. A pack year of smoking is smoking an average of 1 pack of cigarettes a day for 1 year (for example: 1 pack a day for 30 years or 2 packs a day for 15 years). Yearly screening should continue until the smoker has stopped smoking for at least 15 years. Yearly screening should also  be stopped for people who develop a health problem that would prevent them from having lung cancer treatment.  If you are pregnant, do not drink alcohol. If you are breastfeeding, be very cautious about drinking alcohol. If you are not pregnant and choose to drink alcohol, do not exceed 1 drink per day. One drink is considered to be 12 ounces (355 mL) of beer, 5 ounces (148 mL) of wine, or 1.5 ounces (44 mL) of liquor.  Avoid use  of street drugs. Do not share needles with anyone. Ask for help if you need support or instructions about stopping the use of drugs.  High blood pressure causes heart disease and increases the risk of stroke. Blood pressure should be checked at least every 1 to 2 years. Ongoing high blood pressure should be treated with medicines, if weight loss and exercise are not effective.  If you are 56 to 42 years old, ask your caregiver if you should take aspirin to prevent strokes.  Diabetes screening involves taking a blood sample to check your fasting blood sugar level. This should be done once every 3 years, after age 32, if you are within normal weight and without risk factors for diabetes. Testing should be considered at a younger age or be carried out more frequently if you are overweight and have at least 1 risk factor for diabetes.  Breast cancer screening is essential preventative care for women. You should practice "breast self-awareness." This means understanding the normal appearance and feel of your breasts and may include breast self-examination. Any changes detected, no matter how small, should be reported to a caregiver. Women in their 49s and 30s should have a clinical breast exam (CBE) by a caregiver as part of a regular health exam every 1 to 3 years. After age 36, women should have a CBE every year. Starting at age 39, women should consider having a mammogram (breast X-ray) every year. Women who have a family history of breast cancer should talk to their caregiver about genetic screening. Women at a high risk of breast cancer should talk to their caregiver about having an MRI and a mammogram every year.  Breast cancer gene (BRCA)-related cancer risk assessment is recommended for women who have family members with BRCA-related cancers. BRCA-related cancers include breast, ovarian, tubal, and peritoneal cancers. Having family members with these cancers may be associated with an increased risk for  harmful changes (mutations) in the breast cancer genes BRCA1 and BRCA2. Results of the assessment will determine the need for genetic counseling and BRCA1 and BRCA2 testing.  The Pap test is a screening test for cervical cancer. Women should have a Pap test starting at age 31. Between ages 33 and 75, Pap tests should be repeated every 2 years. Beginning at age 67, you should have a Pap test every 3 years as long as the past 3 Pap tests have been normal. If you had a hysterectomy for a problem that was not cancer or a condition that could lead to cancer, then you no longer need Pap tests. If you are between ages 62 and 31, and you have had normal Pap tests going back 10 years, you no longer need Pap tests. If you have had past treatment for cervical cancer or a condition that could lead to cancer, you need Pap tests and screening for cancer for at least 20 years after your treatment. If Pap tests have been discontinued, risk factors (such as a new sexual partner) need to be reassessed  to determine if screening should be resumed. Some women have medical problems that increase the chance of getting cervical cancer. In these cases, your caregiver may recommend more frequent screening and Pap tests.  The human papillomavirus (HPV) test is an additional test that may be used for cervical cancer screening. The HPV test looks for the virus that can cause the cell changes on the cervix. The cells collected during the Pap test can be tested for HPV. The HPV test could be used to screen women aged 64 years and older, and should be used in women of any age who have unclear Pap test results. After the age of 53, women should have HPV testing at the same frequency as a Pap test.  Colorectal cancer can be detected and often prevented. Most routine colorectal cancer screening begins at the age of 70 and continues through age 107. However, your caregiver may recommend screening at an earlier age if you have risk factors for  colon cancer. On a yearly basis, your caregiver may provide home test kits to check for hidden blood in the stool. Use of a small camera at the end of a tube, to directly examine the colon (sigmoidoscopy or colonoscopy), can detect the earliest forms of colorectal cancer. Talk to your caregiver about this at age 23, when routine screening begins. Direct examination of the colon should be repeated every 5 to 10 years through age 68, unless early forms of pre-cancerous polyps or small growths are found.  Hepatitis C blood testing is recommended for all people born from [birth year] through 1965 and any individual with known risks for hepatitis C.  Practice safe sex. Use condoms and avoid high-risk sexual practices to reduce the spread of sexually transmitted infections (STIs). Sexually active women aged 26 and younger should be checked for Chlamydia, which is a common sexually transmitted infection. Older women with new or multiple partners should also be tested for Chlamydia. Testing for other STIs is recommended if you are sexually active and at increased risk.  Osteoporosis is a disease in which the bones lose minerals and strength with aging. This can result in serious bone fractures. The risk of osteoporosis can be identified using a bone density scan. Women ages 70 and over and women at risk for fractures or osteoporosis should discuss screening with their caregivers. Ask your caregiver whether you should be taking a calcium supplement or vitamin D to reduce the rate of osteoporosis.  Menopause can be associated with physical symptoms and risks. Hormone replacement therapy is available to decrease symptoms and risks. You should talk to your caregiver about whether hormone replacement therapy is right for you.  Use sunscreen. Apply sunscreen liberally and repeatedly throughout the day. You should seek shade when your shadow is shorter than you. Protect yourself by wearing long sleeves, pants, a wide-brimmed  hat, and sunglasses year round, whenever you are outdoors.  Notify your caregiver of new moles or changes in moles, especially if there is a change in shape or color. Also notify your caregiver if a mole is larger than the size of a pencil eraser.  Stay current with your immunizations. Document Released: 05/21/2011 Document Revised: 03/02/2013 Document Reviewed: 05/21/2011 [facility] Patient Information 2014 [place], [place]. Smoking Cessation, Tips for Success YOU CAN QUIT SMOKING If you are ready to quit smoking, congratulations! You have chosen to help yourself be healthier. Cigarettes bring nicotine, tar, carbon monoxide, and other irritants into your body. Your lungs, heart, and blood vessels will be able  to work better without these poisons. There are many different ways to quit smoking. Nicotine gum, nicotine patches, a nicotine inhaler, or nicotine nasal spray can help with physical craving. Hypnosis, support groups, and medicines help break the habit of smoking. Here are some tips to help you quit for good.  Throw away all cigarettes.  Clean and remove all ashtrays from your home, work, and car.  On a card, write down your reasons for quitting. Carry the card with you and read it when you get the urge to smoke.  Cleanse your body of nicotine. Drink enough water and fluids to keep your urine clear or pale yellow. Do this after quitting to flush the nicotine from your body.  Learn to predict your moods. Do not let a bad situation be your excuse to have a cigarette. Some situations in your life might tempt you into wanting a cigarette.  Never have "just one" cigarette. It leads to wanting another and another. Remind yourself of your decision to quit.  Change habits associated with smoking. If you smoked while driving or when feeling stressed, try other activities to replace smoking. Stand up when drinking your coffee. Brush your teeth after eating. Sit in a different chair when you read the  paper. Avoid alcohol while trying to quit, and try to drink fewer caffeinated beverages. Alcohol and caffeine may urge you to smoke.  Avoid foods and drinks that can trigger a desire to smoke, such as sugary or spicy foods and alcohol.  Ask people who smoke not to smoke around you.  Have something planned to do right after eating or having a cup of coffee. Take a walk or exercise to perk you up. This will help to keep you from overeating.  Try a relaxation exercise to calm you down and decrease your stress. Remember, you may be tense and nervous for the first 2 weeks after you quit, but this will pass.  Find new activities to keep your hands busy. Play with a pen, coin, or rubber band. Doodle or draw things on paper.  Brush your teeth right after eating. This will help cut down on the craving for the taste of tobacco after meals. You can try mouthwash, too.  Use oral substitutes, such as lemon drops, carrots, a cinnamon stick, or chewing gum, in place of cigarettes. Keep them handy so they are available when you have the urge to smoke.  When you have the urge to smoke, try deep breathing.  Designate your home as a nonsmoking area.  If you are a heavy smoker, ask your caregiver about a prescription for nicotine chewing gum. It can ease your withdrawal from nicotine.  Reward yourself. Set aside the cigarette money you save and buy yourself something nice.  Look for support from others. Join a support group or smoking cessation program. Ask someone at home or at work to help you with your plan to quit smoking.  Always ask yourself, "Do I need this cigarette or is this just a reflex?" Tell yourself, "Today, I choose not to smoke," or "I do not want to smoke." You are reminding yourself of your decision to quit, even if you do smoke a cigarette. HOW WILL I FEEL WHEN I QUIT SMOKING?  The benefits of not smoking start within days of quitting.  You may have symptoms of withdrawal because your  body is used to nicotine (the addictive substance in cigarettes). You may crave cigarettes, be irritable, feel very hungry, cough often, get  headaches, or have difficulty concentrating.  The withdrawal symptoms are only temporary. They are strongest when you first quit but will go away within 10 to 14 days.  When withdrawal symptoms occur, stay in control. Think about your reasons for quitting. Remind yourself that these are signs that your body is healing and getting used to being without cigarettes.  Remember that withdrawal symptoms are easier to treat than the major diseases that smoking can cause.  Even after the withdrawal is over, expect periodic urges to smoke. However, these cravings are generally short-lived and will go away whether you smoke or not. Do not smoke!  If you relapse and smoke again, do not lose hope. Most smokers quit 3 times before they are successful.  If you relapse, do not give up! Plan ahead and think about what you will do the next time you get the urge to smoke. LIFE AS A NONSMOKER: MAKE IT FOR A MONTH, MAKE IT FOR LIFE Day 1: Hang this page where you will see it every day. Day 2: Get rid of all ashtrays, matches, and lighters. Day 3: Drink water. Breathe deeply between sips. Day 4: Avoid places with smoke-filled air, such as bars, clubs, or the smoking section of restaurants. Day 5: Keep track of how much money you save by not smoking. Day 6: Avoid boredom. Keep a good book with you or go to the movies. Day 7: Reward yourself! One week without smoking! Day 8: Make a dental appointment to get your teeth cleaned. Day 9: Decide how you will turn down a cigarette before it is offered to you. Day 10: Review your reasons for quitting. Day 11: Distract yourself. Stay active to keep your mind off smoking and to relieve tension. Take a walk, exercise, read a book, do a crossword puzzle, or try a new hobby. Day 12: Exercise. Get off the bus before your stop or use  stairs instead of escalators. Day 13: Call on friends for support and encouragement. Day 14: Reward yourself! Two weeks without smoking! Day 15: Practice deep breathing exercises. Day 16: Bet a friend that you can stay a nonsmoker. Day 17: Ask to sit in nonsmoking sections of restaurants. Day 18: Hang up "No Smoking" signs. Day 19: Think of yourself as a nonsmoker. Day 20: Each morning, tell yourself you will not smoke. Day 21: Reward yourself! Three weeks without smoking! Day 22: Think of smoking in negative ways. Remember how it stains your teeth, gives you bad breath, and leaves you short of breath. Day 23: Eat a nutritious breakfast. Day 24:Do not relive your days as a smoker. Day 25: Hold a pencil in your hand when talking on the telephone. Day 26: Tell all your friends you do not smoke. Day 27: Think about how much better food tastes. Day 28: Remember, one cigarette is one too many. Day 29: Take up a hobby that will keep your hands busy. Day 30: Congratulations! One month without smoking! Give yourself a big reward. Your caregiver can direct you to community resources or hospitals for support, which may include:  Group support.  Education.  Hypnosis.  Subliminal therapy. Document Released: 08/03/2004 Document Revised: 01/28/2012 Document Reviewed: 04/23/2013 [facility] Patient Information 2014 [place], [place].

## 2013-11-04 NOTE — Assessment & Plan Note (Signed)
On sertraline - reports symptoms stable The current medical regimen is effective;  continue present plan and medications.

## 2013-11-04 NOTE — Assessment & Plan Note (Signed)
BP Readings from Last 3 Encounters:  11/04/13 132/80  10/28/13 130/84  03/27/13 122/80   Home BP log reviewed -uncontrolled, reports exacerbated by pain Start losartan 50 mg 03/2013 - improved The current medical regimen is effective;  continue present plan and medications.

## 2013-11-04 NOTE — Progress Notes (Signed)
Subjective:    [name], female    DOB: 16-Jun-1971, 42 y.o.   MRN: [id number]  HPI  Here for medicare wellness  Diet: heart healthy  Physical activity: sedentary Depression/mood screen: negative Hearing: intact to whispered voice Visual acuity: grossly normal, performs annual eye exam  ADLs: capable Fall risk: none Home safety: good Cognitive evaluation: intact to orientation, naming, recall and repetition EOL planning: adv directives, full code/ I agree  I have personally reviewed and have noted 1. The patient's medical and social history 2. Their use of alcohol, tobacco or illicit drugs 3. Their current medications and supplements 4. The patient's functional ability including ADL's, fall risks, home safety risks and hearing or visual impairment. 5. Diet and physical activities 6. Evidence for depression or mood disorders  Also reviewed chronic medical issues and interval medical events: Thyroid nodule, hyperthyroidism, HTN and chronic back pain  Past Medical History  Diagnosis Date  . Asthma   . Depression   . GERD (gastroesophageal reflux disease)   . HTN (hypertension)   . Allergic rhinitis   . Chronic neck pain     pain mgmt in [place]  . Headache(784.0)    Family History  Problem Relation Age of Onset  . Alcohol abuse      parent, grandparent  . Arthritis      parent  . Breast cancer      parent <50  . Hyperlipidemia      parent  . Hypertension      parent   History  Substance Use Topics  . Smoking status: Current Every Day Smoker -- 1.00 packs/day    Types: Cigarettes  . Smokeless tobacco: Not on file  . Alcohol Use: No    Review of Systems  Constitutional: Negative for fatigue and unexpected weight change.  HENT: Negative for sore throat and trouble swallowing.   Respiratory: Negative for cough, shortness of breath and wheezing.   Cardiovascular: Negative for chest pain, palpitations and leg swelling.  Gastrointestinal:  Negative for nausea, abdominal pain and diarrhea.  Musculoskeletal: Positive for back pain (chronic). Negative for joint swelling.  Neurological: Negative for dizziness, weakness, light-headedness and headaches.  Psychiatric/Behavioral: Negative for dysphoric mood. The patient is not nervous/anxious.   All other systems reviewed and are negative.       Objective:   Physical Exam BP 132/80  Pulse 80  Temp(Src) 99 F (37.2 C) (Oral)  Ht 5\' 2"  (1.575 m)  Wt 171 lb 9.6 oz (77.837 kg)  BMI 31.38 kg/m2  SpO2 95%  LMP 09/28/2013 Wt Readings from Last 3 Encounters:  11/04/13 171 lb 9.6 oz (77.837 kg)  10/28/13 167 lb (75.751 kg)  03/27/13 175 lb 12.8 oz (79.742 kg)   Constitutional: She is overweight, but appears well-developed and well-nourished. No distress.  HENT: Head: Normocephalic and atraumatic. Ears: B TMs ok, no erythema or effusion; Nose: Nose normal. Mouth/Throat: Oropharynx is clear and moist. No oropharyngeal exudate.  Eyes: Conjunctivae and EOM are normal. Pupils are equal, round, and reactive to light. No scleral icterus.  Neck: Normal range of motion. Neck supple. No JVD present. No thyromegaly present.  Cardiovascular: Normal rate, regular rhythm and normal heart sounds.  No murmur heard. No BLE edema. Pulmonary/Chest: Effort normal and breath sounds normal. No respiratory distress. She has no wheezes.  Abdominal: Soft. Bowel sounds are normal. She exhibits no distension. There is no tenderness. no masses Musculoskeletal: Normal range of motion, no joint effusions. No gross deformities  Neurological: She is alert and oriented to person, place, and time. No cranial nerve deficit. Coordination, balance, strength, speech and gait are normal.  Skin: Skin is warm and dry. No rash noted. No erythema.  Psychiatric: She has a normal mood and affect. Her behavior is normal. Judgment and thought content normal.   Lab Results  Component Value Date   WBC 13.2* 07/25/2010   HGB 13.9  07/25/2010   HCT 40.5 07/25/2010   PLT 263.0 07/25/2010   TSH 0.15* 10/28/2013         Assessment & Plan:   CPX/AWV/v70.0 - Today patient counseled on age appropriate routine health concerns for screening and prevention, each reviewed and up to date or declined. Immunizations reviewed and up to date or declined. Labs ordered and reviewed. Risk factors for depression reviewed and negative. Hearing function and visual acuity are intact. ADLs screened and addressed as needed. Functional ability and level of safety reviewed and appropriate. Education, counseling and referrals performed based on assessed risks today. Patient provided with a copy of personalized plan for preventive services.  Also See problem list. Medications and labs reviewed today.

## 2013-11-04 NOTE — Assessment & Plan Note (Signed)
Endo eval 10/28/13 reviewed - for radioactive due to suppressed TSH If active, plan I-131 ablation If normal or inactive, plan bx - per eno Reviewed same with pt today

## 2013-11-04 NOTE — Progress Notes (Signed)
Pre-visit discussion using our clinic review tool. No additional management support is needed unless otherwise documented below in the visit note.  

## 2013-11-19 HISTORY — PX: CERVICAL SPINE SURGERY: SHX589

## 2013-11-20 ENCOUNTER — Telehealth: Payer: Self-pay | Admitting: *Deleted

## 2013-11-20 NOTE — Telephone Encounter (Signed)
Pt called requesting letter of medical recommendation for disability review.  Pt aware Dr Asa Lente out of office til 1.5.15.

## 2013-11-23 ENCOUNTER — Ambulatory Visit (HOSPITAL_COMMUNITY): Payer: Medicare Other

## 2013-11-23 ENCOUNTER — Telehealth: Payer: Self-pay

## 2013-11-23 NOTE — Telephone Encounter (Signed)
I do not perform disability certification nor provide letters stating pt's disability status -  The only letter I can generate is stating how long I have been pt's PCP and the medical issues for which I follow patient thanks

## 2013-11-23 NOTE — Telephone Encounter (Signed)
Pt called requesting that a letter for her Social Security stating current treatments, future treatments, and if she can work or not.  Please advise, Thanks!

## 2013-11-23 NOTE — Telephone Encounter (Signed)
i would be happy to do the letter.  However, there is a charge for the letter, and you have no disability from the thyroid.

## 2013-11-23 NOTE — Telephone Encounter (Signed)
Spoke with pts family member advised of MDs message.

## 2013-11-24 ENCOUNTER — Other Ambulatory Visit (HOSPITAL_COMMUNITY): Payer: Medicare Other

## 2013-11-24 NOTE — Telephone Encounter (Signed)
Left pt voice mail to call back to discuss.

## 2013-12-07 ENCOUNTER — Encounter (HOSPITAL_COMMUNITY): Payer: Medicare Other

## 2013-12-08 ENCOUNTER — Encounter (HOSPITAL_COMMUNITY): Payer: Medicare Other

## 2013-12-18 ENCOUNTER — Telehealth: Payer: Self-pay

## 2013-12-18 NOTE — Telephone Encounter (Signed)
please call pcc: is there anywhere else she can get this done? please call patient:  When we know the date of i-131 rx, i can write a letter for you, to be off for 3 days.

## 2013-12-18 NOTE — Telephone Encounter (Signed)
Pt called stating that she has not received clearance to proceed with camera test for thyroid because she would be have to lay with her head back. Pt also stated that she needed a letter stating what procedures are going to be done and if she will be able to work. Please advise, Thanks!

## 2013-12-21 NOTE — Telephone Encounter (Signed)
Ok, please ask pt to call nuc med to reschedule.

## 2013-12-21 NOTE — Telephone Encounter (Signed)
Emily [name] she states that the only place to get this done is as Emily [name]. Called Pt and she states that she has an appointment on the 2/26 with the Spine specialist. She states that she should be cleared after this date. Pt states that she is going to cancel appointments today.

## 2013-12-21 NOTE — Telephone Encounter (Signed)
Pt informed

## 2013-12-29 ENCOUNTER — Encounter (HOSPITAL_COMMUNITY): Payer: Medicare Other

## 2013-12-30 ENCOUNTER — Encounter (HOSPITAL_COMMUNITY): Payer: Medicare Other

## 2014-01-11 ENCOUNTER — Ambulatory Visit (INDEPENDENT_AMBULATORY_CARE_PROVIDER_SITE_OTHER): Payer: Medicare Other | Admitting: Physician Assistant

## 2014-01-11 ENCOUNTER — Encounter: Payer: Self-pay | Admitting: Physician Assistant

## 2014-01-11 VITALS — BP 120/74 | HR 107 | Temp 99.8°F | Ht 62.0 in | Wt 180.0 lb

## 2014-01-11 DIAGNOSIS — B9689 Other specified bacterial agents as the cause of diseases classified elsewhere: Secondary | ICD-10-CM

## 2014-01-11 DIAGNOSIS — B001 Herpesviral vesicular dermatitis: Secondary | ICD-10-CM

## 2014-01-11 DIAGNOSIS — B009 Herpesviral infection, unspecified: Secondary | ICD-10-CM

## 2014-01-11 DIAGNOSIS — J019 Acute sinusitis, unspecified: Secondary | ICD-10-CM

## 2014-01-11 MED ORDER — ACYCLOVIR 400 MG PO TABS: 400.0000 mg | ORAL_TABLET | Freq: Three times a day (TID) | ORAL | Status: DC

## 2014-01-11 MED ORDER — AMOXICILLIN-POT CLAVULANATE 875-125 MG PO TABS: 1.0000 | ORAL_TABLET | Freq: Two times a day (BID) | ORAL | Status: DC

## 2014-01-11 MED ORDER — PROMETHAZINE-DM 6.25-15 MG/5ML PO SYRP: 5.0000 mL | ORAL_SOLUTION | Freq: Four times a day (QID) | ORAL | Status: DC | PRN

## 2014-01-11 NOTE — Patient Instructions (Signed)
It was great to meet you today Emily [name]   Sinusitis Sinusitis is redness, soreness, and puffiness (inflammation) of the air pockets in the bones of your face (sinuses). The redness, soreness, and puffiness can cause air and mucus to get trapped in your sinuses. This can allow germs to grow and cause an infection.  HOME CARE   Drink enough fluids to keep your pee (urine) clear or pale yellow.  Use a humidifier in your home.  Run a hot shower to create steam in the bathroom. Sit in the bathroom with the door closed. Breathe in the steam 3 4 times a day.  Put a warm, moist washcloth on your face 3 4 times a day, or as told by your doctor.  Use salt water sprays (saline sprays) to wet the thick fluid in your nose. This can help the sinuses drain.  Only take medicine as told by your doctor. GET HELP RIGHT AWAY IF:   Your pain gets worse.  You have very bad headaches.  You are sick to your stomach (nauseous).  You throw up (vomit).  You are very sleepy (drowsy) all the time.  Your face is puffy (swollen).  Your vision changes.  You have a stiff neck.  You have trouble breathing. MAKE SURE YOU:   Understand these instructions.  Will watch your condition.  Will get help right away if you are not doing well or get worse. Document Released: 04/23/2008 Document Revised: 07/30/2012 Document Reviewed: 06/10/2012 [facility] Patient Information 2014 [place].

## 2014-01-11 NOTE — Progress Notes (Signed)
    [name] is a 43 y.o. female DOB: [birth year] MRN: [id number]  Subjective:    HPI  complains of head cold symptoms, ?sinusitus Onset >1 week ago, initially improved then relapsing and worse symptoms  First associated with rhinorrhea, sneezing, sore throat, mild headache and low grade fever Now sinus pressure and mild-mod nasal congestion, yellow-green discharge No relief with OTC meds, has used Tylenol sinus and Ibuprofen Daytime cough relief with albuterol inhaler. Precipitated by sick contacts and weather change  Past Medical History  Diagnosis Date  . Asthma   . Depression   . GERD (gastroesophageal reflux disease)   . HTN (hypertension)   . Allergic rhinitis   . Chronic neck pain     pain mgmt in [place]  . Headache(784.0)   . Thyroid nodule     incidental on MRI neck 2014, s/p endo eval  . Hyperthyroidism     Review of Systems Constitutional: No night sweats, no unexpected weight change Pulmonary: No pleurisy or hemoptysis Cardiovascular: No chest pain or palpitations Abdominal: no N/V     Objective:   Physical Exam BP 120/74  Pulse 107  Temp(Src) 99.8 F (37.7 C) (Oral)  Ht 5\' 2"  (1.575 m)  Wt 180 lb (81.647 kg)  BMI 32.91 kg/m2  SpO2 95%  LMP 12/24/2013 GEN: mildly ill appearing and audible head/chest congestion HENT: NCAT, bilateral TM's normal.  mild sinus tenderness bilaterally, nares with thick discharge and turbinate swelling, oropharynx mild erythema and PND, no exudate, 2-3 mm vesicle consistent with cold sore noted to base of left nares. Eyes: Vision grossly intact, no conjunctivitis Lungs: Clear to auscultation without rhonchi or wheeze, no increased work of breathing Cardiovascular: Regular rate and rhythm, no bilateral edema  Lab Results  Component Value Date   WBC 10.4 11/04/2013   HGB 11.7* 11/04/2013   HCT 36.1 11/04/2013   PLT 303.0 11/04/2013   GLUCOSE 99 11/04/2013   CHOL 152 11/04/2013   TRIG 394.0* 11/04/2013     HDL 23.60* 11/04/2013   LDLDIRECT 77.8 11/04/2013   ALT 15 11/04/2013   AST 19 11/04/2013   NA 139 11/04/2013   K 3.6 11/04/2013   CL 104 11/04/2013   CREATININE 0.6 11/04/2013   BUN 3* 11/04/2013   CO2 28 11/04/2013   TSH 0.15* 10/28/2013      Assessment & Plan:  Viral URI > progression to acute sinusitis Cough, postnasal drip related to above Augmentin prescribed due to symptom duration greater than 7 days and progression despite OTC symptomatic care Prescription cough suppression - new prescriptions done Can continue use of albuterol as needed Saline irrigation and salt gargle advised as needed   Cold sore Rx for acyclovir 400 mg tid x 5 days

## 2014-01-11 NOTE — Progress Notes (Signed)
Pre-visit discussion using our clinic review tool. No additional management support is needed unless otherwise documented below in the visit note.  

## 2014-01-20 ENCOUNTER — Encounter (HOSPITAL_COMMUNITY): Admission: RE | Admit: 2014-01-20 | Payer: Medicare Other | Source: Ambulatory Visit

## 2014-01-21 ENCOUNTER — Encounter (HOSPITAL_COMMUNITY): Payer: Medicare Other

## 2014-02-04 ENCOUNTER — Ambulatory Visit
Admission: RE | Admit: 2014-02-04 | Discharge: 2014-02-04 | Disposition: A | Discharge status: Home / Self Care | Payer: Medicare Other | Source: Ambulatory Visit | Attending: Internal Medicine | Admitting: Internal Medicine

## 2014-02-04 DIAGNOSIS — Z1239 Encounter for other screening for malignant neoplasm of breast: Secondary | ICD-10-CM

## 2014-03-04 ENCOUNTER — Other Ambulatory Visit: Payer: Self-pay | Admitting: Obstetrics and Gynecology

## 2014-03-09 ENCOUNTER — Encounter (HOSPITAL_COMMUNITY): Payer: Self-pay | Admitting: *Deleted

## 2014-03-14 NOTE — H&P (Signed)
Emily Emily [name], Emily [name] NO.:  [id number]  MEDICAL RECORD NO.:  [id number]  LOCATION:  [facility]                         FACILITY:  [facility]  PHYSICIAN:  Lovenia Kim, M.D.DATE OF BIRTH:  10/26/1971  DATE OF ADMISSION:  02/16/2014 DATE OF DISCHARGE:                             HISTORY & PHYSICAL   PREOPERATIVE DIAGNOSIS:  Refractory menorrhagia.  HISTORY OF PRESENT ILLNESS:  A 43 year old white female, G2, P2 with refractory menorrhagia, definitive therapy   Meds:iron therapy, and on inhalers as needed; Zoloft, Ferritin, Flonase,  gabapentin, Voltaren, and lidocaine patch.  PAST MEDICAL HISTORY:  Remarkable for depression and anemia.  ALLERGIES:  MORPHINE.  FAMILY HISTORY:  She has a family history of stomach cancer, pancreatic cancer, breast cancer, hypertension.  SOCIAL HISTORY:  She is a smoker, smokes a pack a day.  She denies domestic physical violence.  PAST SURGICAL HISTORY:  History of vaginal delivery x2, history of cholecystectomy, cryosurgery and shoulder surgery.  PHYSICAL EXAMINATION:  GENERAL:  She is a well-developed, well-nourished female, in no distress.  Height is 60 inches.  Sclerae anicteric. HEENT:  Normal. NECK:  Supple.  Full range of motion. LUNGS:  Clear. HEART:  Regular. ABDOMEN:  Soft , non tender. EXTREMITIES: No c/c/e.  IMPRESSION:  Refractory menorrhagia for definitive therapy.  PLAN:  Proceed with Diag HS, D&C with Novasure ablation.  Risks of anesthesia, infection, bleeding, injury to surrounding organs, possible need for repair was discussed.  Delayed versus immediate complications to include bowel and bladder injury were noted.  The patient acknowledges and wishes to proceed.     Lovenia Kim, M.D.     RJT/MEDQ  D:  03/14/2014  T:  03/14/2014  Job:  [id number]

## 2014-03-15 ENCOUNTER — Ambulatory Visit (HOSPITAL_COMMUNITY)
Admission: RE | Admit: 2014-03-15 | Discharge: 2014-03-15 | Disposition: A | Discharge status: Home / Self Care | Payer: Medicare Other | Source: Ambulatory Visit | Attending: Obstetrics and Gynecology | Admitting: Obstetrics and Gynecology

## 2014-03-15 ENCOUNTER — Encounter (HOSPITAL_COMMUNITY): Payer: Self-pay | Admitting: Anesthesiology

## 2014-03-15 ENCOUNTER — Encounter (HOSPITAL_COMMUNITY): Payer: Self-pay | Admitting: Pharmacy Technician

## 2014-03-15 ENCOUNTER — Encounter (HOSPITAL_COMMUNITY): Payer: Medicare Other | Admitting: Anesthesiology

## 2014-03-15 ENCOUNTER — Ambulatory Visit (HOSPITAL_COMMUNITY): Payer: Medicare Other | Admitting: Anesthesiology

## 2014-03-15 ENCOUNTER — Encounter (HOSPITAL_COMMUNITY)
Admission: RE | Disposition: A | Discharge status: Home / Self Care | Payer: Self-pay | Source: Ambulatory Visit | Attending: Obstetrics and Gynecology

## 2014-03-15 DIAGNOSIS — I10 Essential (primary) hypertension: Secondary | ICD-10-CM | POA: Insufficient documentation

## 2014-03-15 DIAGNOSIS — Z803 Family history of malignant neoplasm of breast: Secondary | ICD-10-CM | POA: Insufficient documentation

## 2014-03-15 DIAGNOSIS — Z6833 Body mass index (BMI) 33.0-33.9, adult: Secondary | ICD-10-CM | POA: Insufficient documentation

## 2014-03-15 DIAGNOSIS — F329 Major depressive disorder, single episode, unspecified: Secondary | ICD-10-CM | POA: Insufficient documentation

## 2014-03-15 DIAGNOSIS — Z8249 Family history of ischemic heart disease and other diseases of the circulatory system: Secondary | ICD-10-CM | POA: Insufficient documentation

## 2014-03-15 DIAGNOSIS — F3289 Other specified depressive episodes: Secondary | ICD-10-CM | POA: Insufficient documentation

## 2014-03-15 DIAGNOSIS — Z800 Family history of malignant neoplasm of digestive organs: Secondary | ICD-10-CM | POA: Insufficient documentation

## 2014-03-15 DIAGNOSIS — J45909 Unspecified asthma, uncomplicated: Secondary | ICD-10-CM | POA: Insufficient documentation

## 2014-03-15 DIAGNOSIS — K219 Gastro-esophageal reflux disease without esophagitis: Secondary | ICD-10-CM | POA: Insufficient documentation

## 2014-03-15 DIAGNOSIS — N920 Excessive and frequent menstruation with regular cycle: Secondary | ICD-10-CM | POA: Insufficient documentation

## 2014-03-15 DIAGNOSIS — E669 Obesity, unspecified: Secondary | ICD-10-CM | POA: Insufficient documentation

## 2014-03-15 DIAGNOSIS — F17200 Nicotine dependence, unspecified, uncomplicated: Secondary | ICD-10-CM | POA: Insufficient documentation

## 2014-03-15 DIAGNOSIS — D649 Anemia, unspecified: Secondary | ICD-10-CM | POA: Insufficient documentation

## 2014-03-15 DIAGNOSIS — E0590 Thyrotoxicosis, unspecified without thyrotoxic crisis or storm: Secondary | ICD-10-CM | POA: Insufficient documentation

## 2014-03-15 HISTORY — PX: DILITATION & CURRETTAGE/HYSTROSCOPY WITH NOVASURE ABLATION: SHX5568

## 2014-03-15 LAB — BASIC METABOLIC PANEL
BUN: 3 mg/dL — ABNORMAL LOW (ref 6–23)
CO2: 27 mEq/L (ref 19–32)
Calcium: 9.9 mg/dL (ref 8.4–10.5)
Chloride: 100 mEq/L (ref 96–112)
Creatinine, Ser: 0.71 mg/dL (ref 0.50–1.10)
GFR calc Af Amer: >90 mL/min (ref >90)
GFR calc non Af Amer: >90 mL/min (ref >90)
Glucose, Bld: 108 mg/dL — ABNORMAL HIGH (ref 70–99)
Potassium: 4.4 mEq/L (ref 3.7–5.3)
Sodium: 140 mEq/L (ref 137–147)

## 2014-03-15 LAB — CBC
HCT: 33.4 % — ABNORMAL LOW (ref 36.0–46.0)
Hemoglobin: 10.6 g/dL — ABNORMAL LOW (ref 12.0–15.0)
MCH: 24.8 pg — ABNORMAL LOW (ref 26.0–34.0)
MCHC: 31.7 g/dL (ref 30.0–36.0)
MCV: 78.2 fL (ref 78.0–100.0)
Platelets: 382 10*3/uL (ref 150–400)
RBC: 4.27 MIL/uL (ref 3.87–5.11)
RDW: 14.3 % (ref 11.5–15.5)
WBC: 9.8 10*3/uL (ref 4.0–10.5)

## 2014-03-15 LAB — HCG, SERUM, QUALITATIVE: Preg, Serum: NEGATIVE

## 2014-03-15 SURGERY — DILATATION & CURETTAGE/HYSTEROSCOPY WITH NOVASURE ABLATION
Anesthesia: General | Site: Uterus

## 2014-03-15 MED ORDER — LACTATED RINGERS IV SOLN
INTRAVENOUS | Status: DC
  Administered 2014-03-15: 09:00:00 via INTRAVENOUS

## 2014-03-15 MED ORDER — VASOPRESSIN 20 UNIT/ML IJ SOLN
INTRAMUSCULAR | Status: AC
  Filled 2014-03-15: qty 1

## 2014-03-15 MED ORDER — TRAMADOL HCL 50 MG PO TABS: 100.0000 mg | ORAL_TABLET | Freq: Four times a day (QID) | ORAL | Status: DC | PRN

## 2014-03-15 MED ORDER — ONDANSETRON HCL 4 MG/2ML IJ SOLN
INTRAMUSCULAR | Status: DC | PRN
  Administered 2014-03-15: 4 mg via INTRAVENOUS

## 2014-03-15 MED ORDER — FENTANYL CITRATE 0.05 MG/ML IJ SOLN
INTRAMUSCULAR | Status: AC
  Filled 2014-03-15: qty 2

## 2014-03-15 MED ORDER — CEFAZOLIN SODIUM-DEXTROSE 2-3 GM-% IV SOLR
INTRAVENOUS | Status: AC
  Filled 2014-03-15: qty 50

## 2014-03-15 MED ORDER — FENTANYL CITRATE 0.05 MG/ML IJ SOLN
INTRAMUSCULAR | Status: AC
  Filled 2014-03-15: qty 5

## 2014-03-15 MED ORDER — LIDOCAINE HCL (CARDIAC) 20 MG/ML IV SOLN
INTRAVENOUS | Status: DC | PRN
  Administered 2014-03-15: 80 mg via INTRAVENOUS

## 2014-03-15 MED ORDER — LIDOCAINE HCL (CARDIAC) 20 MG/ML IV SOLN
INTRAVENOUS | Status: AC
  Filled 2014-03-15: qty 5

## 2014-03-15 MED ORDER — LACTATED RINGERS IR SOLN
Status: DC | PRN
  Administered 2014-03-15: 3000 mL

## 2014-03-15 MED ORDER — ONDANSETRON HCL 4 MG/2ML IJ SOLN
INTRAMUSCULAR | Status: AC
  Filled 2014-03-15: qty 2

## 2014-03-15 MED ORDER — DEXAMETHASONE SODIUM PHOSPHATE 4 MG/ML IJ SOLN
INTRAMUSCULAR | Status: DC | PRN
  Administered 2014-03-15: 10 mg via INTRAVENOUS

## 2014-03-15 MED ORDER — PROPOFOL 10 MG/ML IV EMUL
INTRAVENOUS | Status: AC
  Filled 2014-03-15: qty 20

## 2014-03-15 MED ORDER — CEFAZOLIN SODIUM-DEXTROSE 2-3 GM-% IV SOLR
2.0000 g | INTRAVENOUS | Status: AC
  Administered 2014-03-15: 2 g via INTRAVENOUS

## 2014-03-15 MED ORDER — BUPIVACAINE HCL (PF) 0.25 % IJ SOLN
INTRAMUSCULAR | Status: AC
  Filled 2014-03-15: qty 30

## 2014-03-15 MED ORDER — DEXAMETHASONE SODIUM PHOSPHATE 10 MG/ML IJ SOLN
INTRAMUSCULAR | Status: AC
  Filled 2014-03-15: qty 1

## 2014-03-15 MED ORDER — FENTANYL CITRATE 0.05 MG/ML IJ SOLN
INTRAMUSCULAR | Status: DC | PRN
  Administered 2014-03-15: 50 ug via INTRAVENOUS
  Administered 2014-03-15: 100 ug via INTRAVENOUS

## 2014-03-15 MED ORDER — PROPOFOL INFUSION 10 MG/ML OPTIME
INTRAVENOUS | Status: DC | PRN
  Administered 2014-03-15: 200 mL via INTRAVENOUS

## 2014-03-15 MED ORDER — MIDAZOLAM HCL 2 MG/2ML IJ SOLN
INTRAMUSCULAR | Status: AC
  Filled 2014-03-15: qty 2

## 2014-03-15 MED ORDER — KETOROLAC TROMETHAMINE 30 MG/ML IJ SOLN
INTRAMUSCULAR | Status: AC
Start: 2014-03-15 — End: 2014-03-15
  Filled 2014-03-15: qty 1

## 2014-03-15 MED ORDER — MIDAZOLAM HCL 2 MG/2ML IJ SOLN
INTRAMUSCULAR | Status: AC
Start: 2014-03-15 — End: 2014-03-15
  Filled 2014-03-15: qty 2

## 2014-03-15 MED ORDER — FENTANYL CITRATE 0.05 MG/ML IJ SOLN
25.0000 ug | INTRAMUSCULAR | Status: DC | PRN
  Administered 2014-03-15: 25 ug via INTRAVENOUS

## 2014-03-15 MED ORDER — METOCLOPRAMIDE HCL 5 MG/ML IJ SOLN: 10.0000 mg | Freq: Once | INTRAMUSCULAR | Status: DC | PRN

## 2014-03-15 MED ORDER — KETOROLAC TROMETHAMINE 30 MG/ML IJ SOLN
INTRAMUSCULAR | Status: DC | PRN
  Administered 2014-03-15: 30 mg via INTRAVENOUS

## 2014-03-15 MED ORDER — SODIUM CHLORIDE 0.9 % IJ SOLN
INTRAMUSCULAR | Status: AC
  Filled 2014-03-15: qty 50

## 2014-03-15 MED ORDER — MEPERIDINE HCL 25 MG/ML IJ SOLN: 6.2500 mg | INTRAMUSCULAR | Status: DC | PRN

## 2014-03-15 MED ORDER — BUPIVACAINE HCL (PF) 0.25 % IJ SOLN
INTRAMUSCULAR | Status: DC | PRN
  Administered 2014-03-15: 20 mL

## 2014-03-15 MED ORDER — MIDAZOLAM HCL 2 MG/2ML IJ SOLN
INTRAMUSCULAR | Status: DC | PRN
  Administered 2014-03-15: 2 mg via INTRAVENOUS

## 2014-03-15 SURGICAL SUPPLY — 20 items
ABLATOR ENDOMETRIAL BIPOLAR (ABLATOR) ×2 IMPLANT
CATH ROBINSON RED A/P 16FR (CATHETERS) ×2 IMPLANT
CLOTH BEACON ORANGE TIMEOUT ST (SAFETY) ×2 IMPLANT
CONTAINER PREFILL 10% NBF 60ML (FORM) ×4 IMPLANT
DRAPE HYSTEROSCOPY (DRAPE) ×2 IMPLANT
DRSG TELFA 3X8 NADH (GAUZE/BANDAGES/DRESSINGS) ×2 IMPLANT
GLOVE BIO SURGEON STRL SZ7.5 (GLOVE) ×2 IMPLANT
GOWN STRL REUS W/TWL LRG LVL3 (GOWN DISPOSABLE) ×4 IMPLANT
NDL SPNL 22GX3.5 QUINCKE BK (NEEDLE) ×1 IMPLANT
NEEDLE SPNL 22GX3.5 QUINCKE BK (NEEDLE) ×2 IMPLANT
PACK VAGINAL MINOR WOMEN LF (CUSTOM PROCEDURE TRAY) ×2 IMPLANT
PAD DRESSING TELFA 3X8 NADH (GAUZE/BANDAGES/DRESSINGS) ×1 IMPLANT
PAD OB MATERNITY 4.3X12.25 (PERSONAL CARE ITEMS) ×2 IMPLANT
PAD PREP 24X48 CUFFED NSTRL (MISCELLANEOUS) ×2 IMPLANT
SET TUBING HYSTEROSCOPY 2 NDL (TUBING) ×1 IMPLANT
SYR CONTROL 10ML LL (SYRINGE) ×2 IMPLANT
SYR TB 1ML 25GX5/8 (SYRINGE) ×2 IMPLANT
TOWEL OR 17X24 6PK STRL BLUE (TOWEL DISPOSABLE) ×4 IMPLANT
TUBE HYSTEROSCOPY W Y-CONNECT (TUBING) ×1 IMPLANT
WATER STERILE IRR 1000ML POUR (IV SOLUTION) ×2 IMPLANT

## 2014-03-15 NOTE — Anesthesia Preprocedure Evaluation (Addendum)
Anesthesia Evaluation  Patient identified by MRN, date of birth, ID band Patient awake    Reviewed: Allergy & Precautions, H&P , NPO status , Patient's Chart, lab work & pertinent test results  Airway Mallampati: III TM Distance: >3 FB Neck ROM: Full    Dental  (+) Edentulous Upper, Edentulous Lower   Pulmonary asthma , Current Smoker,  breath sounds clear to auscultation  Pulmonary exam normal       Cardiovascular hypertension, Pt. on medications Rhythm:Regular Rate:Normal     Neuro/Psych  Headaches, PSYCHIATRIC DISORDERS Depression Hx/o Chiari malformation S/P occipital craniectomy    GI/Hepatic Neg liver ROS, GERD-  Medicated and Controlled,  Endo/Other  Hyperthyroidism Obesity  Renal/GU negative Renal ROS  negative genitourinary   Musculoskeletal   Abdominal (+) + obese,   Peds  Hematology   Anesthesia Other Findings   Reproductive/Obstetrics Menorrhagia                         Anesthesia Physical Anesthesia Plan  ASA: II  Anesthesia Plan: General   Post-op Pain Management:    Induction: Intravenous  Airway Management Planned: LMA  Additional Equipment:   Intra-op Plan:   Post-operative Plan: Extubation in OR  Informed Consent: I have reviewed the patients History and Physical, chart, labs and discussed the procedure including the risks, benefits and alternatives for the proposed anesthesia with the patient or authorized representative who has indicated his/her understanding and acceptance.   Dental advisory given  Plan Discussed with: CRNA, Anesthesiologist and Surgeon  Anesthesia Plan Comments:         Anesthesia Quick Evaluation

## 2014-03-15 NOTE — Anesthesia Postprocedure Evaluation (Signed)
  Anesthesia Post-op Note  Patient: Emily [name]  Procedure(s) Performed: Procedure(s): DILATATION & CURETTAGE/HYSTEROSCOPY WITH NOVASURE ABLATION (N/A)  Patient Location: PACU  Anesthesia Type:General  Level of Consciousness: awake, alert  and oriented  Airway and Oxygen Therapy: Patient Spontanous Breathing  Post-op Pain: none  Post-op Assessment: Post-op Vital signs reviewed, Patient's Cardiovascular Status Stable, Respiratory Function Stable, Patent Airway, No signs of Nausea or vomiting and Pain level controlled  Post-op Vital Signs: Reviewed and stable  Last Vitals:  Filed Vitals:   03/15/14 1200  BP: 117/70  Pulse: 75  Temp:   Resp: 22    Complications: No apparent anesthesia complications

## 2014-03-15 NOTE — Anesthesia Procedure Notes (Addendum)
Procedure Name: LMA Insertion Date/Time: 03/15/2014 10:06 AM Performed by: Flossie Dibble Pre-anesthesia Checklist: Patient identified, Emergency Drugs available, Suction available, Patient being monitored and Timeout performed Patient Re-evaluated:Patient Re-evaluated prior to inductionOxygen Delivery Method: Circle system utilized Preoxygenation: Pre-oxygenation with 100% oxygen Intubation Type: IV induction LMA: LMA inserted LMA Size: 4.0 Number of attempts: 1 (Head kept in neutral  position.) Placement Confirmation: breath sounds checked- equal and bilateral and positive ETCO2 Tube secured with: Tape Dental Injury: Teeth and Oropharynx as per pre-operative assessment

## 2014-03-15 NOTE — Op Note (Signed)
03/15/2014  10:47 AM  PATIENT:  Emily [name]  43 y.o. female  PRE-OPERATIVE DIAGNOSIS:  Menorrhagia  POST-OPERATIVE DIAGNOSIS:  Menorrhagia  PROCEDURE:  Procedure(s): DILATATION & CURETTAGE/HYSTEROSCOPY WITH NOVASURE ABLATION  SURGEON:  Surgeon(s): Lovenia Kim, MD  ASSISTANTS: none   ANESTHESIA:   local and general  ESTIMATED BLOOD LOSS: minimal Fluid deficit: 0  DRAINS: none   LOCAL MEDICATIONS USED:  MARCAINE    and Amount: 20 ml  SPECIMEN:  Source of Specimen:  [facility]  DISPOSITION OF SPECIMEN:  PATHOLOGY  COUNTS:  YES  DICTATION #: [id number]  PLAN OF CARE: dc home  PATIENT DISPOSITION:  PACU - hemodynamically stable.

## 2014-03-15 NOTE — Progress Notes (Signed)
[name], female   DOB: 02-16-1971, 43 y.o.   MRN: [id number] Patient seen and examined. Consent witnessed and signed. No changes noted. Update completed.

## 2014-03-15 NOTE — Transfer of Care (Signed)
Immediate Anesthesia Transfer of Care Note  Patient: Emily [name]  Procedure(s) Performed: Procedure(s): DILATATION & CURETTAGE/HYSTEROSCOPY WITH NOVASURE ABLATION (N/A)  Patient Location: PACU  Anesthesia Type:General  Level of Consciousness: awake, alert  and oriented  Airway & Oxygen Therapy: Patient Spontanous Breathing and Patient connected to nasal cannula oxygen  Post-op Assessment: Report given to PACU RN and Post -op Vital signs reviewed and stable  Post vital signs: Reviewed and stable  Complications: No apparent anesthesia complications

## 2014-03-16 ENCOUNTER — Encounter (HOSPITAL_COMMUNITY): Payer: Self-pay | Admitting: Obstetrics and Gynecology

## 2014-03-16 NOTE — Op Note (Signed)
NAMETIFFONY, KITE NO.:  [id number]  MEDICAL RECORD NO.:  [id number]  LOCATION:  [facility]                          FACILITY:  [facility]  PHYSICIAN:  Lovenia Kim, M.D.DATE OF BIRTH:  23-May-1971  DATE OF PROCEDURE:  03/15/2014 DATE OF DISCHARGE:  03/15/2014                              OPERATIVE REPORT   PREOPERATIVE DIAGNOSIS:  Menometrorrhagia with secondary anemia.  POSTOPERATIVE DIAGNOSIS:  Menometrorrhagia with secondary anemia.  PROCEDURE:  Diagnostic hysteroscopy, dilation and curettage, NovaSure endometrial ablation.  SURGEON:  Lovenia Kim, MD  ASSISTANT:  None.  ANESTHESIA:  General.  ESTIMATED BLOOD LOSS:  Less than 50 mL.  COMPLICATIONS:  None.  FLUID DEFICIT:  Zero.  COUNTS:  Correct.  The patient to recovery in good condition.  FINDINGS:  Normal endometrial cavity with thickened anterior and posterior wall.  No other structural defects noted.  Bilateral normal tubal ostia.  No evidence of uterine perforation noted pre and post ablation.  DESCRIPTION OF PROCEDURE:  After being apprised of risks of anesthesia, infection, bleeding, injury to surrounding organs, possible need for repair, delayed versus immediate complications to include bowel and bladder injury, possible need for repair, the patient was brought to the operating room where she was administered a general anesthetic without complications.  Prepped and draped in usual sterile fashion, catheterized until the bladder was empty.  Exam under anesthesia revealed a bulky anteflexed uterus and no adnexal masses.  At this time, a dilute Marcaine solution was placed standard paracervical block 20 mL total.  Uterus was dilated up to a #21 Pratt dilator.  Hysteroscope placed.  Visualization reveals as noted.  Thickened anterior and posterior wall of the endometrium and otherwise normal cavity. Endometrial curettings were collected using sharp curettage in a 4- quadrant method  without difficulty.  Good hemostasis was noted.  At this time, the NovaSure device was seated to a length of 6.5, a width of 4.4. CO2 test was performed and was negative.  The procedure was then initiated to a wattage of 156 watts for 65 seconds.  The device was removed, inspected, and found to be intact.  At this time, revisualization reveals a well ablated anterior and posterior wall with slight evidence of functioning endometrium at the fundal area.  No evidence of perforation, otherwise normal uterus was noted.  Instruments were removed.  The patient tolerated the procedure well.  Fluid deficit was zero as noted. The patient was transferred to recovery in good condition.     Lovenia Kim, M.D.     RJT/MEDQ  D:  03/15/2014  T:  03/16/2014  Job:  [id number]

## 2014-05-13 ENCOUNTER — Other Ambulatory Visit: Payer: Self-pay

## 2014-05-13 MED ORDER — ALBUTEROL SULFATE HFA 108 (90 BASE) MCG/ACT IN AERS: 2.0000 | INHALATION_SPRAY | Freq: Four times a day (QID) | RESPIRATORY_TRACT | Status: DC | PRN

## 2014-06-15 ENCOUNTER — Other Ambulatory Visit: Payer: Self-pay | Admitting: Internal Medicine

## 2014-08-03 ENCOUNTER — Other Ambulatory Visit: Payer: Self-pay | Admitting: Internal Medicine

## 2014-10-21 ENCOUNTER — Other Ambulatory Visit: Payer: Self-pay | Admitting: Internal Medicine

## 2014-12-23 ENCOUNTER — Other Ambulatory Visit: Payer: Self-pay | Admitting: Internal Medicine

## 2014-12-24 DIAGNOSIS — M5136 Other intervertebral disc degeneration, lumbar region: Secondary | ICD-10-CM | POA: Diagnosis not present

## 2014-12-24 DIAGNOSIS — Z79891 Long term (current) use of opiate analgesic: Secondary | ICD-10-CM | POA: Diagnosis not present

## 2014-12-28 DIAGNOSIS — M5030 Other cervical disc degeneration, unspecified cervical region: Secondary | ICD-10-CM | POA: Diagnosis not present

## 2014-12-28 DIAGNOSIS — Z6835 Body mass index (BMI) 35.0-35.9, adult: Secondary | ICD-10-CM | POA: Diagnosis not present

## 2014-12-28 DIAGNOSIS — M961 Postlaminectomy syndrome, not elsewhere classified: Secondary | ICD-10-CM | POA: Diagnosis not present

## 2015-02-21 DIAGNOSIS — G5601 Carpal tunnel syndrome, right upper limb: Secondary | ICD-10-CM | POA: Diagnosis not present

## 2015-02-21 DIAGNOSIS — M542 Cervicalgia: Secondary | ICD-10-CM | POA: Diagnosis not present

## 2015-02-21 DIAGNOSIS — M65321 Trigger finger, right index finger: Secondary | ICD-10-CM | POA: Diagnosis not present

## 2015-02-21 DIAGNOSIS — M79641 Pain in right hand: Secondary | ICD-10-CM | POA: Diagnosis not present

## 2015-02-21 DIAGNOSIS — G5602 Carpal tunnel syndrome, left upper limb: Secondary | ICD-10-CM | POA: Diagnosis not present

## 2015-02-21 DIAGNOSIS — M79642 Pain in left hand: Secondary | ICD-10-CM | POA: Diagnosis not present

## 2015-03-02 DIAGNOSIS — M5030 Other cervical disc degeneration, unspecified cervical region: Secondary | ICD-10-CM | POA: Diagnosis not present

## 2015-03-02 DIAGNOSIS — M5136 Other intervertebral disc degeneration, lumbar region: Secondary | ICD-10-CM | POA: Diagnosis not present

## 2015-03-02 DIAGNOSIS — M961 Postlaminectomy syndrome, not elsewhere classified: Secondary | ICD-10-CM | POA: Diagnosis not present

## 2015-03-02 DIAGNOSIS — F1120 Opioid dependence, uncomplicated: Secondary | ICD-10-CM | POA: Diagnosis not present

## 2015-03-04 ENCOUNTER — Other Ambulatory Visit: Payer: Self-pay | Admitting: Internal Medicine

## 2015-03-29 DIAGNOSIS — G5603 Carpal tunnel syndrome, bilateral upper limbs: Secondary | ICD-10-CM | POA: Insufficient documentation

## 2015-03-29 DIAGNOSIS — G5601 Carpal tunnel syndrome, right upper limb: Secondary | ICD-10-CM | POA: Diagnosis not present

## 2015-03-29 DIAGNOSIS — G5602 Carpal tunnel syndrome, left upper limb: Secondary | ICD-10-CM | POA: Diagnosis not present

## 2015-04-01 DIAGNOSIS — G5601 Carpal tunnel syndrome, right upper limb: Secondary | ICD-10-CM | POA: Diagnosis not present

## 2015-04-01 DIAGNOSIS — Q0700 Arnold-Chiari syndrome without spina bifida or hydrocephalus: Secondary | ICD-10-CM | POA: Diagnosis not present

## 2015-04-01 DIAGNOSIS — M65321 Trigger finger, right index finger: Secondary | ICD-10-CM | POA: Diagnosis not present

## 2015-04-01 DIAGNOSIS — G5602 Carpal tunnel syndrome, left upper limb: Secondary | ICD-10-CM | POA: Diagnosis not present

## 2015-04-26 ENCOUNTER — Other Ambulatory Visit: Payer: Self-pay | Admitting: Internal Medicine

## 2015-04-26 DIAGNOSIS — Z79891 Long term (current) use of opiate analgesic: Secondary | ICD-10-CM | POA: Diagnosis not present

## 2015-04-26 DIAGNOSIS — M5030 Other cervical disc degeneration, unspecified cervical region: Secondary | ICD-10-CM | POA: Diagnosis not present

## 2015-04-26 DIAGNOSIS — M961 Postlaminectomy syndrome, not elsewhere classified: Secondary | ICD-10-CM | POA: Diagnosis not present

## 2015-04-26 DIAGNOSIS — F1120 Opioid dependence, uncomplicated: Secondary | ICD-10-CM | POA: Diagnosis not present

## 2015-04-26 DIAGNOSIS — M5416 Radiculopathy, lumbar region: Secondary | ICD-10-CM | POA: Diagnosis not present

## 2015-05-09 ENCOUNTER — Ambulatory Visit (INDEPENDENT_AMBULATORY_CARE_PROVIDER_SITE_OTHER): Payer: Medicare Other

## 2015-05-09 ENCOUNTER — Telehealth: Payer: Self-pay

## 2015-05-09 VITALS — BP 122/80 | HR 94 | Ht 59.0 in | Wt 185.0 lb

## 2015-05-09 DIAGNOSIS — Z0000 Encounter for general adult medical examination without abnormal findings: Secondary | ICD-10-CM

## 2015-05-09 DIAGNOSIS — J45909 Unspecified asthma, uncomplicated: Secondary | ICD-10-CM | POA: Diagnosis not present

## 2015-05-09 DIAGNOSIS — F411 Generalized anxiety disorder: Secondary | ICD-10-CM

## 2015-05-09 DIAGNOSIS — Z0389 Encounter for observation for other suspected diseases and conditions ruled out: Secondary | ICD-10-CM | POA: Diagnosis not present

## 2015-05-09 DIAGNOSIS — J4530 Mild persistent asthma, uncomplicated: Secondary | ICD-10-CM | POA: Diagnosis not present

## 2015-05-09 DIAGNOSIS — Z01810 Encounter for preprocedural cardiovascular examination: Secondary | ICD-10-CM | POA: Diagnosis not present
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
## 2015-05-09 NOTE — Telephone Encounter (Signed)
Call from the patient and LVM; called her back and stated she did go to [employer] and scheduled  an apt for July.

## 2015-05-09 NOTE — Patient Instructions (Signed)
[name] , Thank you for taking time to come for your Medicare Wellness Visit. I appreciate your ongoing commitment to your health goals. Please review the following plan we discussed and let me know if I can assist you in the future.   States she will drop by [employer] [facility]  today so that she can get started in counseling to assist with feelings related to disability Refuses to be like her uncles;  Will make apt with medical doctor for CPE  These are the goals we discussed: Goals    . Attending meditation / other stress management classes     Will seek counseling for anxiety and worry and overall depression that moves in and out  Stress Stress-related medical problems are becoming increasingly common. The body has a built-in physical response to stressful situations. Faced with pressure, challenge or danger, we need to react quickly. Our bodies release hormones such as cortisol and adrenaline to help do this. These hormones are part of the "fight or flight" response and affect the metabolic rate, heart rate and blood pressure, resulting in a heightened, stressed state that prepares the body for optimum performance in dealing with a stressful situation. It is likely that early man required these mechanisms to stay alive, but usually modern stresses do not call for this, and the same hormones released in today's world can damage health and reduce coping ability. CAUSES  Pressure to perform at work, at school or in sports.  Threats of physical violence.  Money worries.  Arguments.  Family conflicts.  Divorce or separation from significant other.  Bereavement.  New job or unemployment.  Changes in location.  Alcohol or drug abuse. SOMETIMES, THERE IS NO PARTICULAR REASON FOR DEVELOPING STRESS. Almost all people are at risk of being stressed at some time in their lives. It is important to know that some stress is temporary and some is long term.  Temporary stress will go  away when a situation is resolved. Most people can cope with short periods of stress, and it can often be relieved by relaxing, taking a walk or getting any type of exercise, chatting through issues with friends, or having a good night's sleep.  Chronic (long-term, continuous) stress is much harder to deal with. It can be psychologically and emotionally damaging. It can be harmful both for an individual and for friends and family. SYMPTOMS Everyone reacts to stress differently. There are some common effects that help [place] recognize it. In times of extreme stress, people may:  Shake uncontrollably.  Breathe faster and deeper than normal (hyperventilate).  Vomit.  For people with asthma, stress can trigger an attack.  For some people, stress may trigger migraine headaches, ulcers, and body pain. PHYSICAL EFFECTS OF STRESS MAY INCLUDE:  Loss of energy.  Skin problems.  Aches and pains resulting from tense muscles, including neck ache, backache and tension headaches.  Increased pain from arthritis and other conditions.  Irregular heart beat (palpitations).  Periods of irritability or anger.  Apathy or depression.  Anxiety (feeling uptight or worrying).  Unusual behavior.  Loss of appetite.  Comfort eating.  Lack of concentration.  Loss of, or decreased, sex-drive.  Increased smoking, drinking, or recreational drug use.  For women, missed periods.  Ulcers, joint pain, and muscle pain. Post-traumatic stress is the stress caused by any serious accident, strong emotional damage, or extremely difficult or violent experience such as rape or war. Post-traumatic stress victims can experience mixtures of emotions such as fear, shame, depression,  guilt or anger. It may include recurrent memories or images that may be haunting. These feelings can last for weeks, months or even years after the traumatic event that triggered them. Specialized treatment, possibly with medicines and  psychological therapies, is available. If stress is causing physical symptoms, severe distress or making it difficult for you to function as normal, it is worth seeing your caregiver. It is important to remember that although stress is a usual part of life, extreme or prolonged stress can lead to other illnesses that will need treatment. It is better to visit a doctor sooner rather than later. Stress has been linked to the development of high blood pressure and heart disease, as well as insomnia and depression. There is no diagnostic test for stress since everyone reacts to it differently. But a caregiver will be able to spot the physical symptoms, such as:  Headaches.  Shingles.  Ulcers. Emotional distress such as intense worry, low mood or irritability should be detected when the doctor asks pertinent questions to identify any underlying problems that might be the cause. In case there are physical reasons for the symptoms, the doctor may also want to do some tests to exclude certain conditions. If you feel that you are suffering from stress, try to identify the aspects of your life that are causing it. Sometimes you may not be able to change or avoid them, but even a small change can have a positive ripple effect. A simple lifestyle change can make all the difference. STRATEGIES THAT CAN HELP DEAL WITH STRESS:  Delegating or sharing responsibilities.  Avoiding confrontations.  Learning to be more assertive.  Regular exercise.  Avoid using alcohol or street drugs to cope.  Eating a healthy, balanced diet, rich in fruit and vegetables and proteins.  Finding humor or absurdity in stressful situations.  Never taking on more than you know you can handle comfortably.  Organizing your time better to get as much done as possible.  Talking to friends or family and sharing your thoughts and fears.  Listening to music or relaxation tapes.  Relaxation techniques like deep breathing,  meditation, and yoga.  Tensing and then relaxing your muscles, starting at the toes and working up to the head and neck. If you think that you would benefit from help, either in identifying the things that are causing your stress or in learning techniques to help you relax, see a caregiver who is capable of helping you with this. Rather than relying on medications, it is usually better to try and identify the things in your life that are causing stress and try to deal with them. There are many techniques of managing stress including counseling, psychotherapy, aromatherapy, yoga, and exercise. Your caregiver can help you determine what is best for you. Document Released: 01/26/2003 Document Revised: 11/10/2013 Document Reviewed: 12/23/2007 [facility] Patient Information 2015 [place], [place]. This information is not intended to replace advice given to you by your health care provider. Make sure you discuss any questions you have with your health care provider.        This is a list of the screening recommended for you and due dates:  Health Maintenance  Topic Date Due  . HIV Screening  05/02/1986  . Flu Shot  06/20/2015  . Pap Smear  10/26/2016  . Tetanus Vaccine  03/22/2020

## 2015-05-11 NOTE — Progress Notes (Signed)
I have personally reviewed this case. I agree with history and findings as documented above. I reviewed and approve of the assessment and plan as listed above. Gwendolyn Grant, MD

## 2015-05-12 DIAGNOSIS — Z01818 Encounter for other preprocedural examination: Secondary | ICD-10-CM | POA: Diagnosis not present

## 2015-05-12 DIAGNOSIS — M79644 Pain in right finger(s): Secondary | ICD-10-CM | POA: Insufficient documentation

## 2015-05-12 DIAGNOSIS — M25531 Pain in right wrist: Secondary | ICD-10-CM | POA: Diagnosis not present

## 2015-05-12 DIAGNOSIS — Z0001 Encounter for general adult medical examination with abnormal findings: Secondary | ICD-10-CM | POA: Insufficient documentation

## 2015-05-16 ENCOUNTER — Other Ambulatory Visit: Payer: Self-pay | Admitting: Internal Medicine

## 2015-05-17 DIAGNOSIS — M65321 Trigger finger, right index finger: Secondary | ICD-10-CM | POA: Insufficient documentation

## 2015-05-17 DIAGNOSIS — G5601 Carpal tunnel syndrome, right upper limb: Secondary | ICD-10-CM | POA: Diagnosis not present

## 2015-05-25 ENCOUNTER — Other Ambulatory Visit: Payer: Self-pay | Admitting: Internal Medicine

## 2015-05-30 ENCOUNTER — Ambulatory Visit: Payer: Medicare Other | Admitting: Licensed Clinical Social Worker

## 2015-06-14 ENCOUNTER — Ambulatory Visit (INDEPENDENT_AMBULATORY_CARE_PROVIDER_SITE_OTHER): Payer: Medicare Other | Admitting: Internal Medicine

## 2015-06-14 ENCOUNTER — Other Ambulatory Visit (INDEPENDENT_AMBULATORY_CARE_PROVIDER_SITE_OTHER): Payer: Medicare Other

## 2015-06-14 ENCOUNTER — Encounter: Payer: Self-pay | Admitting: Internal Medicine

## 2015-06-14 VITALS — BP 158/100 | HR 94 | Temp 98.7°F | Resp 16 | Ht 59.0 in | Wt 187.8 lb

## 2015-06-14 DIAGNOSIS — I10 Essential (primary) hypertension: Secondary | ICD-10-CM

## 2015-06-14 DIAGNOSIS — R140 Abdominal distension (gaseous): Secondary | ICD-10-CM | POA: Insufficient documentation

## 2015-06-14 DIAGNOSIS — R7989 Other specified abnormal findings of blood chemistry: Secondary | ICD-10-CM

## 2015-06-14 DIAGNOSIS — E669 Obesity, unspecified: Secondary | ICD-10-CM

## 2015-06-14 DIAGNOSIS — K649 Unspecified hemorrhoids: Secondary | ICD-10-CM | POA: Insufficient documentation

## 2015-06-14 DIAGNOSIS — N924 Excessive bleeding in the premenopausal period: Secondary | ICD-10-CM

## 2015-06-14 LAB — COMPREHENSIVE METABOLIC PANEL
ALT: 10 U/L (ref 0–35)
AST: 14 U/L (ref 0–37)
Albumin: 4.2 g/dL (ref 3.5–5.2)
Alkaline Phosphatase: 94 U/L (ref 39–117)
BUN: 7 mg/dL (ref 6–23)
CO2: 27 mEq/L (ref 19–32)
Calcium: 9.2 mg/dL (ref 8.4–10.5)
Chloride: 105 mEq/L (ref 96–112)
Creatinine, Ser: 0.72 mg/dL (ref 0.40–1.20)
GFR: 113.11 mL/min (ref >60.00)
Glucose, Bld: 100 mg/dL — ABNORMAL HIGH (ref 70–99)
Potassium: 3.5 mEq/L (ref 3.5–5.1)
Sodium: 140 mEq/L (ref 135–145)
Total Bilirubin: 0.4 mg/dL (ref 0.2–1.2)
Total Protein: 7.5 g/dL (ref 6.0–8.3)

## 2015-06-14 LAB — CBC
HCT: 36.1 % (ref 36.0–46.0)
Hemoglobin: 11.9 g/dL — ABNORMAL LOW (ref 12.0–15.0)
MCHC: 33 g/dL (ref 30.0–36.0)
MCV: 83.3 fl (ref 78.0–100.0)
Platelets: 265 10*3/uL (ref 150.0–400.0)
RBC: 4.34 Mil/uL (ref 3.87–5.11)
RDW: 13.7 % (ref 11.5–15.5)
WBC: 9.3 10*3/uL (ref 4.0–10.5)

## 2015-06-14 LAB — LIPID PANEL
Cholesterol: 150 mg/dL (ref 0–200)
HDL: 23.2 mg/dL — ABNORMAL LOW (ref >39.00)
NonHDL: 126.8
Total CHOL/HDL Ratio: 6
Triglycerides: 367 mg/dL — ABNORMAL HIGH (ref 0.0–149.0)
VLDL: 73.4 mg/dL — ABNORMAL HIGH (ref 0.0–40.0)

## 2015-06-14 LAB — FERRITIN: Ferritin: 5.6 ng/mL — ABNORMAL LOW (ref 10.0–291.0)

## 2015-06-14 LAB — LDL CHOLESTEROL, DIRECT: Direct LDL: 74 mg/dL

## 2015-06-14 LAB — HEMOGLOBIN A1C: Hgb A1c MFr Bld: 5.1 % (ref 4.6–6.5)

## 2015-06-14 MED ORDER — SENNOSIDES-DOCUSATE SODIUM 8.6-50 MG PO TABS: 1.0000 | ORAL_TABLET | Freq: Every day | ORAL | Status: DC

## 2015-06-14 MED ORDER — LOSARTAN POTASSIUM 50 MG PO TABS: 50.0000 mg | ORAL_TABLET | Freq: Every day | ORAL | Status: DC

## 2015-06-14 MED ORDER — HYDROCORTISONE 2.5 % RE CREA: 1.0000 "application " | TOPICAL_CREAM | Freq: Two times a day (BID) | RECTAL | Status: DC

## 2015-06-14 NOTE — Assessment & Plan Note (Signed)
Likely she has high stool burden and needs to move her bowels more regularly. Rx for senokot-s that she will start taking to help her move her bowels. Also talked to her about fiber and water needs. Suspect that this could be worsening her back pain and then worsening the constipation with increase in pain usage. She is taking high dose pain medication and needs some help with her bowels. If no improvement with regular bowel movement refer to GI.

## 2015-06-14 NOTE — Assessment & Plan Note (Signed)
Rx for proctosol, she declined exam today and if this does not work will agree to exam.

## 2015-06-14 NOTE — Progress Notes (Signed)
   Subjective:    [name], female    DOB: [birth year], 44 y.o.   MRN: [id number]  HPI The patient is a 44 YO woman who is coming in with several acute complaints. Her blood pressure was been running high recently at multiple doctor visits with her recent hand surgery and her pain clinic. She has been having some headaches as well. Denies chest pains, SOB, nausea, confusion. She was prescribed a blood pressure medicine but never took it at all. She recently learned that there is heart disease in her mother's side and this has scared her enough to come in and treat it.  Her next complaint is hemorrhoids. She is kind of constipated and bloated most of the time. She knows that some of this is related to her chronic pain use. She tries to eat dietary fiber and moves her bowels every 2 days. Not hard when she moves them. Also has hemorrhoids that are inflamed right now. Some mild blood spotting but more pain and itching. Does feel bloated most of the time and feels that this could be worsening her back pain. Has not tried any laxatives except if she has gut spasms.   Review of Systems  Constitutional: Positive for activity change. Negative for fever, appetite change and fatigue.  Respiratory: Negative for cough, chest tightness, shortness of breath and wheezing.   Cardiovascular: Negative for chest pain, palpitations and leg swelling.  Gastrointestinal: Positive for abdominal pain, constipation and abdominal distention. Negative for nausea, vomiting and diarrhea.  Musculoskeletal: Positive for myalgias, back pain, arthralgias and neck pain.  Skin: Negative.   Neurological: Positive for numbness and headaches. Negative for dizziness, weakness and light-headedness.      Objective:   Physical Exam  Constitutional: She is oriented to person, place, and time. She appears well-developed and well-nourished.  Overweight  HENT:  Head: Normocephalic and atraumatic.  Eyes: EOM are normal.    Neck: Normal range of motion.  Cardiovascular: Normal rate and regular rhythm.   Pulmonary/Chest: Effort normal. No respiratory distress. She has no wheezes.  Abdominal: Soft. She exhibits distension. There is no tenderness. There is no rebound and no guarding.  Musculoskeletal: She exhibits no edema.  Neurological: She is alert and oriented to person, place, and time.  Skin: Skin is warm and dry.   Filed Vitals:   06/14/15 0841  BP: 170/90  Pulse: 94  Temp: 98.7 F (37.1 C)  TempSrc: Oral  Resp: 16  Height: 4\' 11"  (1.499 m)  Weight: 187 lb 12.8 oz (85.186 kg)  SpO2: 96%      Assessment & Plan:

## 2015-06-14 NOTE — Assessment & Plan Note (Signed)
BP moderately elevated today and per her reports has been elevated the last several visits. Will start the losartan 50 mg daily she was supposed to start more than 1 year ago. Checking labs for signs of damage from blood pressure.

## 2015-06-14 NOTE — Patient Instructions (Addendum)
We have sent in proctosol cream for your hemorrhoids. Use it on them twice a day until they have shrunk.   We have also sent in the losartan for the blood pressure that you need to start taking for the blood pressure. Take 1 pill daily. Call us if you have any problems or questions. You need to come back in 2 months for a check.   We are checking blood work today and will call you back with the results.   We have sent in senokot for the bowels which has two medicines. One helps to keep everything easy to move and the other helps the bowels to move better and not be blocked up. Our goal is for you to move the bowels daily to see if this helps with your bloating.   High-Fiber Diet Fiber is found in fruits, vegetables, and grains. A high-fiber diet encourages the addition of more whole grains, legumes, fruits, and vegetables in your diet. The recommended amount of fiber for adult males is 38 g per day. For adult females, it is 25 g per day. Pregnant and lactating women should get 28 g of fiber per day. If you have a digestive or bowel problem, ask your caregiver for advice before adding high-fiber foods to your diet. Eat a variety of high-fiber foods instead of only a select few type of foods.  PURPOSE  To increase stool bulk.  To make bowel movements more regular to prevent constipation.  To lower cholesterol.  To prevent overeating. WHEN IS THIS DIET USED?  It may be used if you have constipation and hemorrhoids.  It may be used if you have uncomplicated diverticulosis (intestine condition) and irritable bowel syndrome.  It may be used if you need help with weight management.  It may be used if you want to add it to your diet as a protective measure against atherosclerosis, diabetes, and cancer. SOURCES OF FIBER  Whole-grain breads and cereals.  Fruits, such as apples, oranges, bananas, berries, prunes, and pears.  Vegetables, such as green peas, carrots, sweet potatoes, beets,  broccoli, cabbage, spinach, and artichokes.  Legumes, such split peas, soy, lentils.  Almonds. FIBER CONTENT IN FOODS Starches and Grains / Dietary Fiber (g)  Cheerios, 1 cup / 3 g  Corn Flakes cereal, 1 cup / 0.7 g  Rice crispy treat cereal, 1 cup / 0.3 g  Instant oatmeal (cooked),  cup / 2 g  Frosted wheat cereal, 1 cup / 5.1 g  Brown, long-grain rice (cooked), 1 cup / 3.5 g  White, long-grain rice (cooked), 1 cup / 0.6 g  Enriched macaroni (cooked), 1 cup / 2.5 g Legumes / Dietary Fiber (g)  Baked beans (canned, plain, or vegetarian),  cup / 5.2 g  Kidney beans (canned),  cup / 6.8 g  Pinto beans (cooked),  cup / 5.5 g Breads and Crackers / Dietary Fiber (g)  Plain or honey graham crackers, 2 squares / 0.7 g  Saltine crackers, 3 squares / 0.3 g  Plain, salted pretzels, 10 pieces / 1.8 g  Whole-wheat bread, 1 slice / 1.9 g  White bread, 1 slice / 0.7 g  Raisin bread, 1 slice / 1.2 g  Plain bagel, 3 oz / 2 g  Flour tortilla, 1 oz / 0.9 g  Corn tortilla, 1 small / 1.5 g  Hamburger or hotdog bun, 1 small / 0.9 g Fruits / Dietary Fiber (g)  Apple with skin, 1 medium / 4.4 g  Sweetened applesauce,  cup / 1.5 g  Banana,  medium / 1.5 g  Grapes, 10 grapes / 0.4 g  Orange, 1 small / 2.3 g  Raisin, 1.5 oz / 1.6 g  Melon, 1 cup / 1.4 g Vegetables / Dietary Fiber (g)  Green beans (canned),  cup / 1.3 g  Carrots (cooked),  cup / 2.3 g  Broccoli (cooked),  cup / 2.8 g  Peas (cooked),  cup / 4.4 g  Mashed potatoes,  cup / 1.6 g  Lettuce, 1 cup / 0.5 g  Corn (canned),  cup / 1.6 g  Tomato,  cup / 1.1 g Document Released: 11/05/2005 Document Revised: 05/06/2012 Document Reviewed: 02/07/2012 Exit[employer] Patient Information 2015 [place], [place]. This information is not intended to replace advice given to you by your health care provider. Make sure you discuss any questions you have with your health care provider.

## 2015-06-14 NOTE — Progress Notes (Signed)
Pre visit review using our clinic review tool, if applicable. No additional management support is needed unless otherwise documented below in the visit note. 

## 2015-06-28 DIAGNOSIS — M5416 Radiculopathy, lumbar region: Secondary | ICD-10-CM | POA: Diagnosis not present

## 2015-06-28 DIAGNOSIS — F1120 Opioid dependence, uncomplicated: Secondary | ICD-10-CM | POA: Diagnosis not present

## 2015-06-28 DIAGNOSIS — M961 Postlaminectomy syndrome, not elsewhere classified: Secondary | ICD-10-CM | POA: Diagnosis not present

## 2015-06-30 ENCOUNTER — Other Ambulatory Visit: Payer: Self-pay

## 2015-06-30 DIAGNOSIS — Z1231 Encounter for screening mammogram for malignant neoplasm of breast: Secondary | ICD-10-CM

## 2015-07-18 ENCOUNTER — Other Ambulatory Visit: Payer: Self-pay | Admitting: Internal Medicine

## 2015-07-19 ENCOUNTER — Other Ambulatory Visit: Payer: Self-pay | Admitting: Internal Medicine

## 2015-08-09 DIAGNOSIS — M5416 Radiculopathy, lumbar region: Secondary | ICD-10-CM | POA: Diagnosis not present

## 2015-08-10 ENCOUNTER — Ambulatory Visit: Payer: Medicare Other

## 2015-08-15 ENCOUNTER — Ambulatory Visit: Payer: Medicare Other | Admitting: Internal Medicine

## 2015-09-15 ENCOUNTER — Ambulatory Visit: Payer: Medicare Other | Admitting: Internal Medicine

## 2015-09-15 ENCOUNTER — Encounter: Payer: Self-pay | Admitting: Internal Medicine

## 2015-09-15 ENCOUNTER — Ambulatory Visit (INDEPENDENT_AMBULATORY_CARE_PROVIDER_SITE_OTHER): Payer: Medicare Other | Admitting: Internal Medicine

## 2015-09-15 VITALS — BP 162/88 | HR 91 | Temp 99.1°F | Resp 18 | Ht 59.0 in | Wt 184.8 lb

## 2015-09-15 DIAGNOSIS — N63 Unspecified lump in unspecified breast: Secondary | ICD-10-CM

## 2015-09-15 DIAGNOSIS — I10 Essential (primary) hypertension: Secondary | ICD-10-CM | POA: Diagnosis not present

## 2015-09-15 DIAGNOSIS — E041 Nontoxic single thyroid nodule: Secondary | ICD-10-CM | POA: Diagnosis not present

## 2015-09-15 NOTE — Patient Instructions (Signed)
Keep the mammogram appointment, the spot in your breast is likely a cyst. These can grow with caffeine intake, smoking, and hormones.   We have ordered the thyroid ultrasound to check up on the nodule that they saw several years ago. It is important to go to this.   Breast Cyst A breast cyst is a sac in the breast that is filled with fluid. Breast cysts are common in women. Women can have one or many cysts. When the breasts contain many cysts, it is usually due to a noncancerous (benign) condition called fibrocystic change. These lumps form under the influence of female hormones (estrogen and progesterone). The lumps are most often located in the upper, outer portion of the breast. They are often more swollen, painful, and tender before your period starts. They usually disappear after menopause, unless you are on hormone therapy.  There are several types of cysts:  Macrocyst. This is a cyst that is about 2 in. (5.1 cm) in diameter.   Microcyst. This is a tiny cyst that you cannot feel but can be seen with a mammogram or an ultrasound.   Galactocele. This is a cyst containing milk that may develop if you suddenly stop breastfeeding.   Sebaceous cyst of the skin. This type of cyst is not in the breast tissue itself. Breast cysts do not increase your risk of breast cancer. However, they must be monitored closely because they can be cancerous.  CAUSES  It is not known exactly what causes a breast cyst to form. Possible causes include:  An overgrowth of milk glands and connective tissue in the breast can block the milk glands, causing them to fill with fluid.   Scar tissue in the breast from previous surgery may block the glands, causing a cyst.  RISK FACTORS Estrogen may influence the development of a breast cyst.  SIGNS AND SYMPTOMS   Feeling a smooth, round, soft lump (like a grape) in the breast that is easily moveable.   Breast discomfort or pain.  Increase in size of the lump  before your menstrual period and decrease in its size after your menstrual period.  DIAGNOSIS  A cyst can be felt during a physical exam by your health care provider. A breast X-ray exam (mammogram) and ultrasonography will be done to confirm the diagnosis. Fluid may be removed from the cyst with a needle (fine needle aspiration) to make sure the cyst is not cancerous.  TREATMENT  Treatment may not be necessary. Your health care provider may monitor the cyst to see if it goes away on its own. If treatment is needed, it may include:  Hormone treatment.   Needle aspiration. There is a chance of the cyst coming back after aspiration.   Surgery to remove the whole cyst.  HOME CARE INSTRUCTIONS   Keep all follow-up appointments with your health care provider.  See your health care provider regularly:  Get a yearly exam by your health care provider.  Have a clinical breast exam by a health care provider every 1-3 years if you are 52-24 years of age. After age 75 years, you should have the exam every year.   Get mammogram tests as directed by your health care provider.   Understand the normal appearance and feel of your breasts and perform breast self-exams.   Only take over-the-counter or prescription medicines as directed by your health care provider.   Wear a supportive bra, especially when exercising.   Avoid caffeine.   Reduce your salt  intake, especially before your menstrual period. Too much salt can cause fluid retention, breast swelling, and discomfort.  SEEK MEDICAL CARE IF:   You feel, or think you feel, a lump in your breast.   You notice that both breasts look or feel different than usual.   Your breast is still causing pain after your menstrual period is over.   You need medicine for breast pain and swelling that occurs with your menstrual period.  SEEK IMMEDIATE MEDICAL CARE IF:   You have severe pain, tenderness, redness, or warmth in your breast.    You have nipple discharge or bleeding.   Your breast lump becomes hard and painful.   You find new lumps or bumps that were not there before.   You feel lumps in your armpit (axilla).   You notice dimpling or wrinkling of the breast or nipple.   You have a fever.  MAKE SURE YOU:  Understand these instructions.  Will watch your condition.  Will get help right away if you are not doing well or get worse.   This information is not intended to replace advice given to you by your health care provider. Make sure you discuss any questions you have with your health care provider.   Document Released: 11/05/2005 Document Revised: 07/08/2013 Document Reviewed: 06/04/2013 Elsevier Interactive Patient Education [employer].

## 2015-09-15 NOTE — Progress Notes (Signed)
Pre visit review using our clinic review tool, if applicable. No additional management support is needed unless otherwise documented below in the visit note. 

## 2015-09-16 DIAGNOSIS — N63 Unspecified lump in unspecified breast: Secondary | ICD-10-CM | POA: Insufficient documentation

## 2015-09-16 NOTE — Assessment & Plan Note (Signed)
Has not been taking her medication in the last several days. Have asked her to take regularly and especially before her next visit so we can know if we need to change.

## 2015-09-16 NOTE — Assessment & Plan Note (Signed)
Likely cyst but getting mammogram for check. Talked to her about decreasing caffeine intake to help and stop smoking. At this time not causing pain, can use tylenol for pain (not nsaids due to uncontrolled htn).

## 2015-09-16 NOTE — Progress Notes (Signed)
   Subjective:    [name], female    DOB: Nov 18, 1971, 44 y.o.   MRN: [id number]  HPI The patient is a 44 YO female coming in for left breast lump. It does seem to grow during her cycles and then shrink in between. Started about 2 months ago. She is getting mammogram next week. She does drink caffeine and does not notice if it affects the spot. Not tender and no skin changes. No rash. No weight change. She has not taken her blood pressure medicine in several days.   Review of Systems  Constitutional: Positive for activity change. Negative for fever, appetite change and fatigue.  Respiratory: Negative for cough, chest tightness, shortness of breath and wheezing.   Cardiovascular: Negative for chest pain, palpitations and leg swelling.  Gastrointestinal: Positive for constipation. Negative for nausea, vomiting and diarrhea.  Musculoskeletal: Positive for myalgias, back pain and neck pain.  Skin: Negative.        Lump in left breast  Neurological: Negative for dizziness, weakness and light-headedness.      Objective:   Physical Exam  Constitutional: She is oriented to person, place, and time. She appears well-developed and well-nourished.  Overweight  HENT:  Head: Normocephalic and atraumatic.  Eyes: EOM are normal.  Neck: Normal range of motion.  Cardiovascular: Normal rate and regular rhythm.   Pulmonary/Chest: Effort normal. No respiratory distress. She has no wheezes.  Left breast with a small round cyst 12 o'clock, no LN palpable and no skin changes. No dimpling. Right breast normal.   Abdominal: Soft. She exhibits no distension. There is no tenderness. There is no rebound and no guarding.  Musculoskeletal: She exhibits no edema.  Neurological: She is alert and oriented to person, place, and time.  Skin: Skin is warm and dry.   Filed Vitals:   09/15/15 0918  BP: 162/88  Pulse: 91  Temp: 99.1 F (37.3 C)  TempSrc: Oral  Resp: 18  Height: 4\' 11"  (1.499 m)    Weight: 184 lb 12.8 oz (83.825 kg)  SpO2: 96%      Assessment & Plan:

## 2015-09-19 ENCOUNTER — Ambulatory Visit (INDEPENDENT_AMBULATORY_CARE_PROVIDER_SITE_OTHER): Payer: 59 | Admitting: Licensed Clinical Social Worker

## 2015-09-19 DIAGNOSIS — F332 Major depressive disorder, recurrent severe without psychotic features: Secondary | ICD-10-CM

## 2015-09-19 DIAGNOSIS — F410 Panic disorder [episodic paroxysmal anxiety] without agoraphobia: Secondary | ICD-10-CM

## 2015-09-22 ENCOUNTER — Other Ambulatory Visit: Payer: Self-pay | Admitting: Internal Medicine

## 2015-09-22 ENCOUNTER — Ambulatory Visit
Admission: RE | Admit: 2015-09-22 | Discharge: 2015-09-22 | Disposition: A | Discharge status: Home / Self Care | Payer: Medicare Other | Source: Ambulatory Visit

## 2015-09-22 DIAGNOSIS — Z1231 Encounter for screening mammogram for malignant neoplasm of breast: Secondary | ICD-10-CM

## 2015-09-22 DIAGNOSIS — N63 Unspecified lump in unspecified breast: Secondary | ICD-10-CM

## 2015-09-26 ENCOUNTER — Telehealth: Payer: Self-pay | Admitting: Internal Medicine

## 2015-09-26 NOTE — Telephone Encounter (Signed)
Patient went to [facility] last week.  They are requesting Dr. Asa Lente to send a referral over for a diagnostic mammogram.

## 2015-09-27 ENCOUNTER — Ambulatory Visit
Admission: RE | Admit: 2015-09-27 | Discharge: 2015-09-27 | Disposition: A | Discharge status: Home / Self Care | Payer: Medicare Other | Source: Ambulatory Visit | Attending: Internal Medicine | Admitting: Internal Medicine

## 2015-09-27 ENCOUNTER — Other Ambulatory Visit: Payer: Self-pay | Admitting: Internal Medicine

## 2015-09-27 ENCOUNTER — Ambulatory Visit (INDEPENDENT_AMBULATORY_CARE_PROVIDER_SITE_OTHER): Payer: 59 | Admitting: Licensed Clinical Social Worker

## 2015-09-27 DIAGNOSIS — N63 Unspecified lump in unspecified breast: Secondary | ICD-10-CM

## 2015-09-27 DIAGNOSIS — F331 Major depressive disorder, recurrent, moderate: Secondary | ICD-10-CM | POA: Diagnosis not present

## 2015-09-27 DIAGNOSIS — N6012 Diffuse cystic mastopathy of left breast: Secondary | ICD-10-CM | POA: Diagnosis not present

## 2015-09-27 DIAGNOSIS — F411 Generalized anxiety disorder: Secondary | ICD-10-CM

## 2015-09-27 DIAGNOSIS — N6011 Diffuse cystic mastopathy of right breast: Secondary | ICD-10-CM | POA: Diagnosis not present

## 2015-09-27 NOTE — Telephone Encounter (Signed)
Order has been signed and faxed yesterday.

## 2015-09-27 NOTE — Telephone Encounter (Signed)
Left breast US order faxed. [facility] called. They needed a verbal to [place] the right side after a mass was seen by radiologist while reviewing the mammogram results. Verbal okay given. [facility] is sending order over for PCP to sign.

## 2015-09-28 ENCOUNTER — Other Ambulatory Visit: Payer: Self-pay | Admitting: Internal Medicine

## 2015-09-28 DIAGNOSIS — M5136 Other intervertebral disc degeneration, lumbar region: Secondary | ICD-10-CM | POA: Diagnosis not present

## 2015-09-28 DIAGNOSIS — Z5181 Encounter for therapeutic drug level monitoring: Secondary | ICD-10-CM | POA: Diagnosis not present

## 2015-09-28 DIAGNOSIS — Z79899 Other long term (current) drug therapy: Secondary | ICD-10-CM | POA: Diagnosis not present

## 2015-09-28 DIAGNOSIS — M5030 Other cervical disc degeneration, unspecified cervical region: Secondary | ICD-10-CM | POA: Diagnosis not present

## 2015-10-03 ENCOUNTER — Encounter: Payer: Self-pay | Admitting: Internal Medicine

## 2015-10-04 ENCOUNTER — Ambulatory Visit: Payer: Medicare Other | Admitting: Licensed Clinical Social Worker

## 2015-10-05 DIAGNOSIS — M25532 Pain in left wrist: Secondary | ICD-10-CM | POA: Diagnosis not present

## 2015-10-05 DIAGNOSIS — Z01818 Encounter for other preprocedural examination: Secondary | ICD-10-CM | POA: Diagnosis not present

## 2015-10-07 ENCOUNTER — Ambulatory Visit
Admission: RE | Admit: 2015-10-07 | Discharge: 2015-10-07 | Disposition: A | Discharge status: Home / Self Care | Payer: Medicare Other | Source: Ambulatory Visit | Attending: Internal Medicine | Admitting: Internal Medicine

## 2015-10-07 DIAGNOSIS — E042 Nontoxic multinodular goiter: Secondary | ICD-10-CM | POA: Diagnosis not present

## 2015-10-07 DIAGNOSIS — E041 Nontoxic single thyroid nodule: Secondary | ICD-10-CM

## 2015-10-10 ENCOUNTER — Other Ambulatory Visit: Payer: Self-pay | Admitting: Internal Medicine

## 2015-10-10 DIAGNOSIS — E041 Nontoxic single thyroid nodule: Secondary | ICD-10-CM

## 2015-10-11 ENCOUNTER — Ambulatory Visit
Admission: RE | Admit: 2015-10-11 | Discharge: 2015-10-11 | Disposition: A | Discharge status: Home / Self Care | Payer: Medicare Other | Source: Ambulatory Visit | Attending: Internal Medicine | Admitting: Internal Medicine

## 2015-10-11 ENCOUNTER — Other Ambulatory Visit (HOSPITAL_COMMUNITY)
Admission: RE | Admit: 2015-10-11 | Discharge: 2015-10-11 | Disposition: A | Discharge status: Home / Self Care | Payer: Medicare Other | Source: Ambulatory Visit | Attending: Diagnostic Radiology | Admitting: Diagnostic Radiology

## 2015-10-11 DIAGNOSIS — E041 Nontoxic single thyroid nodule: Secondary | ICD-10-CM | POA: Insufficient documentation

## 2015-10-17 ENCOUNTER — Telehealth: Payer: Self-pay | Admitting: Internal Medicine

## 2015-10-17 NOTE — Telephone Encounter (Signed)
Patient called regarding her thyroid biopsy result. I let her know but she is still wondering if she needs to still take the radiation pill. Can you please call her at [phone]

## 2015-10-18 DIAGNOSIS — G5602 Carpal tunnel syndrome, left upper limb: Secondary | ICD-10-CM | POA: Diagnosis not present

## 2015-10-18 NOTE — Telephone Encounter (Signed)
Pt said someone called her?  Step did you happen to call pt about her results?

## 2015-10-18 NOTE — Telephone Encounter (Signed)
Spoke with patient about normal results.

## 2015-10-26 DIAGNOSIS — G5603 Carpal tunnel syndrome, bilateral upper limbs: Secondary | ICD-10-CM | POA: Diagnosis not present

## 2015-11-02 ENCOUNTER — Other Ambulatory Visit: Payer: Self-pay | Admitting: Internal Medicine

## 2015-11-25 DIAGNOSIS — M5136 Other intervertebral disc degeneration, lumbar region: Secondary | ICD-10-CM | POA: Diagnosis not present

## 2015-12-01 ENCOUNTER — Other Ambulatory Visit: Payer: Self-pay | Admitting: Internal Medicine

## 2015-12-16 ENCOUNTER — Ambulatory Visit (INDEPENDENT_AMBULATORY_CARE_PROVIDER_SITE_OTHER): Payer: Medicare Other | Admitting: Internal Medicine

## 2015-12-16 ENCOUNTER — Encounter: Payer: Self-pay | Admitting: Internal Medicine

## 2015-12-16 ENCOUNTER — Other Ambulatory Visit (INDEPENDENT_AMBULATORY_CARE_PROVIDER_SITE_OTHER): Payer: Medicare Other

## 2015-12-16 VITALS — BP 124/84 | HR 99 | Temp 99.1°F | Resp 16 | Ht 59.0 in | Wt 186.0 lb

## 2015-12-16 DIAGNOSIS — D509 Iron deficiency anemia, unspecified: Secondary | ICD-10-CM

## 2015-12-16 DIAGNOSIS — E041 Nontoxic single thyroid nodule: Secondary | ICD-10-CM | POA: Diagnosis not present

## 2015-12-16 DIAGNOSIS — E042 Nontoxic multinodular goiter: Secondary | ICD-10-CM

## 2015-12-16 DIAGNOSIS — Z23 Encounter for immunization: Secondary | ICD-10-CM

## 2015-12-16 LAB — TSH: TSH: 0.79 u[IU]/mL (ref 0.35–4.50)

## 2015-12-16 LAB — CBC
HCT: 38.2 % (ref 36.0–46.0)
Hemoglobin: 12.4 g/dL (ref 12.0–15.0)
MCHC: 32.4 g/dL (ref 30.0–36.0)
MCV: 86.7 fl (ref 78.0–100.0)
Platelets: 300 10*3/uL (ref 150.0–400.0)
RBC: 4.4 Mil/uL (ref 3.87–5.11)
RDW: 13.2 % (ref 11.5–15.5)
WBC: 10.6 10*3/uL — ABNORMAL HIGH (ref 4.0–10.5)

## 2015-12-16 LAB — FERRITIN: Ferritin: 8.5 ng/mL — ABNORMAL LOW (ref 10.0–291.0)

## 2015-12-16 LAB — T4, FREE: Free T4: 0.66 ng/dL (ref 0.60–1.60)

## 2015-12-16 LAB — VITAMIN B12: Vitamin B-12: 419 pg/mL (ref 211–911)

## 2015-12-16 NOTE — Progress Notes (Signed)
   Subjective:    [name], female    DOB: 1971-11-14, 45 y.o.   MRN: [id number]  HPI The patient is a 45 YO female coming in for follow up on her thyroid nodules and her iron deficiency. She is still having very heavy periods and lots of pain before. She has had a procedure but may need a hysterectomy to help. There is family history of the same. She is trying to eat iron rich foods and has taken iron pills in the past which she did not feel like helped. Her thyroid nodule recently biopsied and normal. No problems with swallowing or choking on food. Some fatigue. No heat or cold intolerance.   Review of Systems  Constitutional: Positive for activity change and fatigue. Negative for fever and appetite change.  Respiratory: Negative for cough, chest tightness, shortness of breath and wheezing.   Cardiovascular: Negative for chest pain, palpitations and leg swelling.  Gastrointestinal: Negative for nausea, vomiting and diarrhea.  Musculoskeletal: Positive for myalgias, back pain and neck pain.  Skin: Negative.   Neurological: Negative for dizziness, weakness and light-headedness.  Psychiatric/Behavioral: Negative.       Objective:   Physical Exam  Constitutional: She is oriented to person, place, and time. She appears well-developed and well-nourished.  Overweight  HENT:  Head: Normocephalic and atraumatic.  Eyes: EOM are normal.  Neck: Normal range of motion.  Cardiovascular: Normal rate and regular rhythm.   Pulmonary/Chest: Effort normal. No respiratory distress. She has no wheezes.  Abdominal: Soft. She exhibits no distension. There is no tenderness. There is no rebound and no guarding.  Musculoskeletal: She exhibits no edema.  Neurological: She is alert and oriented to person, place, and time.  Skin: Skin is warm and dry.   Filed Vitals:   12/16/15 0957  BP: 124/84  Pulse: 99  Temp: 99.1 F (37.3 C)  TempSrc: Oral  Resp: 16  Height: 4\' 11"  (1.499 m)    Weight: 186 lb (84.369 kg)  SpO2: 95%      Assessment & Plan:  Flu shot given at visit.

## 2015-12-16 NOTE — Progress Notes (Signed)
Pre visit review using our clinic review tool, if applicable. No additional management support is needed unless otherwise documented below in the visit note. 

## 2015-12-16 NOTE — Patient Instructions (Signed)
We are checking the labs today and will call you back with the results.   Iron-Rich Diet Iron is a mineral that helps your body to produce hemoglobin. Hemoglobin is a protein in your red blood cells that carries oxygen to your body's tissues. Eating too little iron may cause you to feel weak and tired, and it can increase your risk for infection. Eating enough iron is necessary for your body's metabolism, muscle function, and nervous system. Iron is naturally found in many foods. It can also be added to foods or fortified in foods. There are two types of dietary iron:  Heme iron. Heme iron is absorbed by the body more easily than nonheme iron. Heme iron is found in meat, poultry, and fish.  Nonheme iron. Nonheme iron is found in dietary supplements, iron-fortified grains, beans, and vegetables. You may need to follow an iron-rich diet if:  You have been diagnosed with iron deficiency or iron-deficiency anemia.  You have a condition that prevents you from absorbing dietary iron, such as:  Infection in your intestines.  Celiac disease. This involves long-lasting (chronic) inflammation of your intestines.  You do not eat enough iron.  You eat a diet that is high in foods that impair iron absorption.  You have lost a lot of blood.  You have heavy bleeding during your menstrual cycle.  You are pregnant. WHAT IS MY PLAN? Your health care provider may help you to determine how much iron you need per day based on your condition. Generally, when a person consumes sufficient amounts of iron in the diet, the following iron needs are met:  Men.  55-51 years old: 11 mg per day.  6-2 years old: 8 mg per day.  Women.   42-58 years old: 15 mg per day.  3-35 years old: 18 mg per day.  Over 72 years old: 8 mg per day.  Pregnant women: 27 mg per day.  Breastfeeding women: 9 mg per day. WHAT DO I NEED TO KNOW ABOUT AN IRON-RICH DIET?  Eat fresh fruits and vegetables that are high in  vitamin C along with foods that are high in iron. This will help increase the amount of iron that your body absorbs from food, especially with foods containing nonheme iron. Foods that are high in vitamin C include oranges, peppers, tomatoes, and mango.  Take iron supplements only as directed by your health care provider. Overdose of iron can be life-threatening. If you were prescribed iron supplements, take them with orange juice or a vitamin C supplement.  Cook foods in pots and pans that are made from iron.   Eat nonheme iron-containing foods alongside foods that are high in heme iron. This helps to improve your iron absorption.   Certain foods and drinks contain compounds that impair iron absorption. Avoid eating these foods in the same meal as iron-rich foods or with iron supplements. These include:  Coffee, black tea, and red wine.  Milk, dairy products, and foods that are high in calcium.  Beans, soybeans, and peas.  Whole grains.  When eating foods that contain both nonheme iron and compounds that impair iron absorption, follow these tips to absorb iron better.   Soak beans overnight before cooking.  Soak whole grains overnight and drain them before using.  Ferment flours before baking, such as using yeast in bread dough. WHAT FOODS CAN I EAT? Grains Iron-fortified breakfast cereal. Iron-fortified whole-wheat bread. Enriched rice. Sprouted grains. Vegetables Spinach. Potatoes with skin. Green peas. Broccoli. Red and  green bell peppers. Fermented vegetables. Fruits Prunes. Raisins. Oranges. Strawberries. Mango. Grapefruit. Meats and Other Protein Sources Beef liver. Oysters. Beef. Shrimp. [place]. Chicken. [place]. Sardines. Chickpeas. Nuts. Tofu. Beverages Tomato juice. Fresh orange juice. Prune juice. Hibiscus tea. Fortified instant breakfast shakes. Condiments Tahini. Fermented soy sauce. Sweets and Desserts Black-strap molasses.  Other Wheat germ. The  items listed above may not be a complete list of recommended foods or beverages. Contact your dietitian for more options. WHAT FOODS ARE NOT RECOMMENDED? Grains Whole grains. Bran cereal. Bran flour. Oats. Vegetables Artichokes. Brussels sprouts. Kale. Fruits Blueberries. Raspberries. Strawberries. Figs. Meats and Other Protein Sources Soybeans. Products made from soy protein. Dairy Milk. Cream. Cheese. Yogurt. Cottage cheese. Beverages Coffee. Black tea. Red wine. Sweets and Desserts Cocoa. Chocolate. Ice cream. Other Basil. Oregano. Parsley. The items listed above may not be a complete list of foods and beverages to avoid. Contact your dietitian for more information.   This information is not intended to replace advice given to you by your health care provider. Make sure you discuss any questions you have with your health care provider.   Document Released: 06/19/2005 Document Revised: 11/26/2014 Document Reviewed: 06/02/2014 Elsevier Interactive Patient Education [employer].

## 2015-12-16 NOTE — Assessment & Plan Note (Signed)
Not taking iron pills now, is trying to eat lots of greens. Given information about iron rich foods and the needs. Checking CBC, B12 and ferritin today. Treat as appropriate. She is thinking about hysterectomy to help with her fibroids and heavy bleeding.

## 2015-12-16 NOTE — Assessment & Plan Note (Signed)
S/P ultrasound and biopsy which was normal since last visit. Checking TSH and free T4 with labs today. No new symptoms.

## 2016-01-03 ENCOUNTER — Other Ambulatory Visit: Payer: Self-pay | Admitting: Internal Medicine

## 2016-01-06 DIAGNOSIS — M5030 Other cervical disc degeneration, unspecified cervical region: Secondary | ICD-10-CM | POA: Diagnosis not present

## 2016-01-06 DIAGNOSIS — M5412 Radiculopathy, cervical region: Secondary | ICD-10-CM | POA: Diagnosis not present

## 2016-01-06 DIAGNOSIS — M961 Postlaminectomy syndrome, not elsewhere classified: Secondary | ICD-10-CM | POA: Diagnosis not present

## 2016-01-20 DIAGNOSIS — M5030 Other cervical disc degeneration, unspecified cervical region: Secondary | ICD-10-CM | POA: Diagnosis not present

## 2016-01-26 ENCOUNTER — Other Ambulatory Visit: Payer: Self-pay | Admitting: Orthopedic Surgery

## 2016-01-26 DIAGNOSIS — M5030 Other cervical disc degeneration, unspecified cervical region: Secondary | ICD-10-CM

## 2016-02-06 ENCOUNTER — Ambulatory Visit
Admission: RE | Admit: 2016-02-06 | Discharge: 2016-02-06 | Disposition: A | Discharge status: Home / Self Care | Payer: Medicare Other | Source: Ambulatory Visit | Attending: Orthopedic Surgery | Admitting: Orthopedic Surgery

## 2016-02-06 ENCOUNTER — Other Ambulatory Visit: Payer: Self-pay | Admitting: Internal Medicine

## 2016-02-06 DIAGNOSIS — M5030 Other cervical disc degeneration, unspecified cervical region: Secondary | ICD-10-CM

## 2016-02-06 DIAGNOSIS — M50222 Other cervical disc displacement at C5-C6 level: Secondary | ICD-10-CM | POA: Diagnosis not present

## 2016-02-10 DIAGNOSIS — M5412 Radiculopathy, cervical region: Secondary | ICD-10-CM | POA: Diagnosis not present

## 2016-02-10 DIAGNOSIS — M5030 Other cervical disc degeneration, unspecified cervical region: Secondary | ICD-10-CM | POA: Diagnosis not present

## 2016-02-10 DIAGNOSIS — M961 Postlaminectomy syndrome, not elsewhere classified: Secondary | ICD-10-CM | POA: Diagnosis not present

## 2016-02-21 DIAGNOSIS — M5030 Other cervical disc degeneration, unspecified cervical region: Secondary | ICD-10-CM | POA: Diagnosis not present

## 2016-02-21 DIAGNOSIS — G894 Chronic pain syndrome: Secondary | ICD-10-CM | POA: Diagnosis not present

## 2016-02-21 DIAGNOSIS — Z5181 Encounter for therapeutic drug level monitoring: Secondary | ICD-10-CM | POA: Diagnosis not present

## 2016-02-21 DIAGNOSIS — M5136 Other intervertebral disc degeneration, lumbar region: Secondary | ICD-10-CM | POA: Diagnosis not present

## 2016-02-21 DIAGNOSIS — Z79899 Other long term (current) drug therapy: Secondary | ICD-10-CM | POA: Diagnosis not present

## 2016-02-27 ENCOUNTER — Other Ambulatory Visit: Payer: Self-pay | Admitting: Internal Medicine

## 2016-03-15 ENCOUNTER — Ambulatory Visit (INDEPENDENT_AMBULATORY_CARE_PROVIDER_SITE_OTHER): Payer: Medicare Other | Admitting: Family

## 2016-03-15 ENCOUNTER — Encounter: Payer: Self-pay | Admitting: Family

## 2016-03-15 VITALS — BP 160/98 | HR 101 | Temp 99.8°F | Resp 18 | Wt 187.0 lb

## 2016-03-15 DIAGNOSIS — J45901 Unspecified asthma with (acute) exacerbation: Secondary | ICD-10-CM | POA: Diagnosis not present

## 2016-03-15 MED ORDER — ALBUTEROL SULFATE (2.5 MG/3ML) 0.083% IN NEBU: 2.5000 mg | INHALATION_SOLUTION | Freq: Once | RESPIRATORY_TRACT | Status: DC

## 2016-03-15 MED ORDER — PREDNISONE 10 MG PO TABS: ORAL_TABLET | ORAL | Status: DC

## 2016-03-15 MED ORDER — ALBUTEROL SULFATE (2.5 MG/3ML) 0.083% IN NEBU: 2.5000 mg | INHALATION_SOLUTION | Freq: Four times a day (QID) | RESPIRATORY_TRACT | Status: DC | PRN

## 2016-03-15 MED ORDER — ALBUTEROL SULFATE (2.5 MG/3ML) 0.083% IN NEBU
2.5000 mg | INHALATION_SOLUTION | Freq: Once | RESPIRATORY_TRACT | Status: AC
  Administered 2016-03-15: 2.5 mg via RESPIRATORY_TRACT

## 2016-03-15 NOTE — Patient Instructions (Addendum)
Use albuterol every 6 hours for first 24 hours to get good medication into the lungs and loosen congestion; after, you may use as needed and eventually stop all together when cough resolves.  Increase intake of clear fluids. Congestion is best treated by hydration, when mucus is wetter, it is thinner, less sticky, and easier to expel from the body, either through coughing up drainage, or by blowing your nose.   Get plenty of rest.   Use saline nasal drops and blow your nose frequently. Run a humidifier at night and elevate the head of the bed. Vicks Vapor rub will help with congestion and cough. Steam showers and sinus massage for congestion.   Use Acetaminophen or Ibuprofen as needed for fever or pain. Avoid second hand smoke. Even the smallest exposure will worsen symptoms.   Over the counter medications you can try include Delsym for cough, a decongestant for congestion, and Mucinex or Robitussin as an expectorant. Be sure to just get the plain Mucinex or Robitussin that just has one medication (Guaifenesen). We don't recommend the combination products. Note, be sure to drink two glasses of water with each dose of Mucinex as the medication will not work well without adequate hydration.   You can also try a teaspoon of honey to see if this will help reduce cough. Throat lozenges can sometimes be beneficial as well.    This illness will typically last 7 - 10 days.   Please follow up with our clinic if you develop a fever greater than 101 F, symptoms worsen, or do not resolve in the next week.   Asthma Attack Prevention While you may not be able to control the fact that you have asthma, you can take actions to prevent asthma attacks. The best way to prevent asthma attacks is to maintain good control of your asthma. You can achieve this by:  Taking your medicines as directed.  Avoiding things that can irritate your airways or make your asthma symptoms worse (asthma triggers).  Keeping track of  how well your asthma is controlled and of any changes in your symptoms.  Responding quickly to worsening asthma symptoms (asthma attack).  Seeking emergency care when it is needed. WHAT ARE SOME WAYS TO PREVENT AN ASTHMA ATTACK? Have a Plan Work with your health care provider to create a written plan for managing and treating your asthma attacks (asthma action plan). This plan includes:  A list of your asthma triggers and how you can avoid them.  Information on when medicines should be taken and when their dosages should be changed.  The use of a device that measures how well your lungs are working (peak flow meter). Monitor Your Asthma Use your peak flow meter and record your results in a journal every day. A drop in your peak flow numbers on one or more days may indicate the start of an asthma attack. This can happen even before you start to feel symptoms. You can prevent an asthma attack from getting worse by following the steps in your asthma action plan. Avoid Asthma Triggers Work with your asthma health care provider to find out what your asthma triggers are. This can be done by:  Allergy testing.  Keeping a journal that notes when asthma attacks occur and the factors that may have contributed to them.  Determining if there are other medical conditions that are making your asthma worse. Once you have determined your asthma triggers, take steps to avoid them. This may include avoiding excessive  or prolonged exposure to:  Dust. Have someone dust and vacuum your home for you once or twice a week. Using a high-efficiency particulate arrestance (HEPA) vacuum is best.  Smoke. This includes campfire smoke, forest fire smoke, and secondhand smoke from tobacco products.  Pet dander. Avoid contact with animals that you know you are allergic to.  Allergens from trees, grasses or pollens. Avoid spending a lot of time outdoors when pollen counts are high, and on very windy days.  Very  cold, dry, or humid air.  Mold.  Foods that contain high amounts of sulfites.  Strong odors.  Outdoor air pollutants, such as Lexicographer.  Indoor air pollutants, such as aerosol sprays and fumes from household cleaners.  Household pests, including dust mites and cockroaches, and pest droppings.  Certain medicines, including NSAIDs. Always talk to your health care provider before stopping or starting any new medicines. Medicines Take over-the-counter and prescription medicines only as told by your health care provider. Many asthma attacks can be prevented by carefully following your medicine schedule. Taking your medicines correctly is especially important when you cannot avoid certain asthma triggers. Act Quickly If an asthma attack does happen, acting quickly can decrease how severe it is and how long it lasts. Take these steps:   Pay attention to your symptoms. If you are coughing, wheezing, or having difficulty breathing, do not wait to see if your symptoms go away on their own. Follow your asthma action plan.  If you have followed your asthma action plan and your symptoms are not improving, call your health care provider or seek immediate medical care at the nearest hospital. It is important to note how often you need to use your fast-acting rescue inhaler. If you are using your rescue inhaler more often, it may mean that your asthma is not under control. Adjusting your asthma treatment plan may help you to prevent future asthma attacks and help you to gain better control of your condition. HOW CAN I PREVENT AN ASTHMA ATTACK WHEN I EXERCISE? Follow advice from your health care provider about whether you should use your fast-acting inhaler before exercising. Many people with asthma experience exercise-induced bronchoconstriction (EIB). This condition often worsens during vigorous exercise in cold, humid, or dry environments. Usually, people with EIB can stay very active by  pre-treating with a fast-acting inhaler before exercising.   This information is not intended to replace advice given to you by your health care provider. Make sure you discuss any questions you have with your health care provider.   Document Released: 10/24/2009 Document Revised: 07/27/2015 Document Reviewed: 04/07/2015 Elsevier Interactive Patient Education [employer].

## 2016-03-15 NOTE — Progress Notes (Signed)
Pre visit review using our clinic review tool, if applicable. No additional management support is needed unless otherwise documented below in the visit note. 

## 2016-03-15 NOTE — Progress Notes (Signed)
Subjective:    [name], female    DOB: Jan 01, 1971, 45 y.o.   MRN: [id number]   Emily [name] is a 45 y.o. female who presents today for an acute visit.    HPI Comments: Sinus congestion started after mowing the yard this weekend. Asthma.     Sinus Problem This is a new problem. The current episode started in the past 7 days. Associated symptoms include congestion, coughing and shortness of breath. Pertinent negatives include no chills, ear pain, headaches, sinus pressure or sore throat. Treatments tried: OTC cough.   Past Medical History  Diagnosis Date  . Depression   . GERD (gastroesophageal reflux disease)   . Allergic rhinitis   . Chronic neck pain     pain mgmt in [place]  . Headache(784.0)   . Thyroid nodule     incidental on MRI neck 2014, s/p endo eval  . Hyperthyroidism   . HTN (hypertension)     BP has been normal recently per pt and not taking meds  . Asthma     uses inhaler as needed   Morphine and related Current Outpatient Prescriptions on File Prior to Visit  Medication Sig Dispense Refill  . diclofenac sodium (VOLTAREN) 1 % GEL Apply 2 g topically 3 (three) times daily as needed (for pain).    . EQ LORATADINE 10 MG tablet TAKE ONE TABLET BY MOUTH ONCE DAILY 30 tablet 11  . EQ STOOL SOFTENER/LAXATIVE 8.6-50 MG tablet TAKE ONE TABLET BY MOUTH ONCE DAILY 30 tablet 0  . fluticasone (FLONASE) 50 MCG/ACT nasal spray USE TWO SPRAY(S) IN EACH NOSTRIL ONCE DAILY IN THE MORNING 16 g 1  . fluticasone (FLONASE) 50 MCG/ACT nasal spray USE TWO SPRAY(S) IN EACH NOSTRIL ONCE DAILY 16 g 3  . gabapentin (NEURONTIN) 300 MG capsule Take 300 mg by mouth 2 (two) times daily.    Marland Kitchen HYDROcodone-acetaminophen (NORCO) 10-325 MG per tablet Take 1 tablet by mouth 3 (three) times daily.    Marland Kitchen losartan (COZAAR) 50 MG tablet TAKE ONE TABLET BY MOUTH ONCE DAILY 90 tablet 0  . metaxalone (SKELAXIN) 800 MG tablet Take 800 mg by mouth 3 (three) times daily.    . Multiple  Vitamin (MULTIVITAMIN WITH MINERALS) TABS tablet Take 1 tablet by mouth daily. Reported on 12/16/2015    . PROAIR HFA 108 (90 BASE) MCG/ACT inhaler INHALE TWO PUFFS BY MOUTH EVERY 6 HOURS AS NEEDED FOR WHEEZING OR SHORTNESS OF BREATH 18 each 5  . PROCTOSOL HC 2.5 % rectal cream APPLY CREAM RECTALLY 2 TIMES DAILY 29 g 1  . ranitidine (ZANTAC) 150 MG tablet Take 150 mg by mouth 2 (two) times daily.    . SENNA-S 8.6-50 MG tablet TAKE ONE TABLET BY MOUTH ONCE DAILY 30 tablet 0  . SENNA-S 8.6-50 MG tablet TAKE ONE TABLET BY MOUTH ONCE DAILY 30 tablet 11  . sertraline (ZOLOFT) 100 MG tablet Take 100 mg by mouth daily.     Marland Kitchen tiZANidine (ZANAFLEX) 4 MG tablet Take 4 mg by mouth 3 (three) times daily.     No current facility-administered medications on file prior to visit.    Social History  Substance Use Topics  . Smoking status: Current Every Day Smoker -- 1.00 packs/day for 18 years    Types: Cigarettes  . Smokeless tobacco: None  . Alcohol Use: No    Review of Systems  Constitutional: Negative for fever, chills and unexpected weight change.  HENT: Positive for congestion and  rhinorrhea. Negative for ear pain, sinus pressure and sore throat.   Eyes: Negative for visual disturbance.  Respiratory: Positive for cough, shortness of breath and wheezing.   Cardiovascular: Negative for chest pain, palpitations and leg swelling.  Gastrointestinal: Negative for nausea and vomiting.  Musculoskeletal: Negative for myalgias and arthralgias.  Skin: Negative for rash.  Neurological: Negative for headaches.  Hematological: Negative for adenopathy.      Objective:    BP 160/98 mmHg  Pulse 101  Temp(Src) 99.8 F (37.7 C) (Oral)  Resp 18  Wt 187 lb (84.823 kg)  SpO2 98%   Physical Exam  Constitutional: She appears well-developed and well-nourished.  HENT:  Head: Normocephalic and atraumatic.  Right Ear: Hearing, tympanic membrane, external ear and ear canal normal. No drainage, swelling or  tenderness. No foreign bodies. Tympanic membrane is not erythematous and not bulging. No middle ear effusion. No decreased hearing is noted.  Left Ear: Hearing, tympanic membrane, external ear and ear canal normal. No drainage, swelling or tenderness. No foreign bodies. Tympanic membrane is not erythematous and not bulging.  No middle ear effusion. No decreased hearing is noted.  Nose: Nose normal. No rhinorrhea. Right sinus exhibits no maxillary sinus tenderness and no frontal sinus tenderness. Left sinus exhibits no maxillary sinus tenderness and no frontal sinus tenderness.  Mouth/Throat: Uvula is midline, oropharynx is clear and moist and mucous membranes are normal. No oropharyngeal exudate, posterior oropharyngeal edema, posterior oropharyngeal erythema or tonsillar abscesses.  Eyes: Conjunctivae are normal.  Cardiovascular: Regular rhythm, normal heart sounds and normal pulses.   Pulmonary/Chest: Effort normal. She has wheezes. She has no rhonchi. She has no rales.  Diffuse scattered wheezing bilateral lung fields.  Lymphadenopathy:       Head (right side): No submental, no submandibular, no tonsillar, no preauricular, no posterior auricular and no occipital adenopathy present.       Head (left side): No submental, no submandibular, no tonsillar, no preauricular, no posterior auricular and no occipital adenopathy present.    She has no cervical adenopathy.  Neurological: She is alert.  Skin: Skin is warm and dry.  Psychiatric: She has a normal mood and affect. Her speech is normal and behavior is normal. Thought content normal.  Vitals reviewed. Patient felt significantly better after albuterol treatment. Lung sounds clear and increased. Wheezing resolved.       Assessment & Plan:   1. Asthma with acute exacerbation, unspecified asthma severity  Suspect seasonal allergies has triggered asthma exacerbation. SaO2 increased with treatment.   - albuterol (PROVENTIL) (2.5 MG/3ML) 0.083%  nebulizer solution; Take 3 mLs (2.5 mg total) by nebulization every 6 (six) hours as needed for wheezing or shortness of breath.  Dispense: 150 mL; Refill: 1 - predniSONE (DELTASONE) 10 MG tablet; Take 4 tablets ( total 40 mg) by mouth for 2 days; take 3 tablets ( total 30 mg) by mouth for 2 days; take 2 tablets ( total 20 mg) by mouth for 1 day; take 1 tablet ( total 10 mg) by mouth for 1 day.  Dispense: 17 tablet; Refill: 0 - albuterol (PROVENTIL) (2.5 MG/3ML) 0.083% nebulizer solution 2.5 mg; Take 3 mLs (2.5 mg total) by nebulization once.   I am having Ms. Medlock start on albuterol and predniSONE. I am also having her maintain her sertraline, gabapentin, diclofenac sodium, multivitamin with minerals, fluticasone, ranitidine, HYDROcodone-acetaminophen, metaxalone, tiZANidine, EQ LORATADINE, PROAIR HFA, SENNA-S, EQ STOOL SOFTENER/LAXATIVE, SENNA-S, fluticasone, PROCTOSOL HC, and losartan. We administered albuterol.   Meds ordered this encounter  Medications  . albuterol (PROVENTIL) (2.5 MG/3ML) 0.083% nebulizer solution    Sig: Take 3 mLs (2.5 mg total) by nebulization every 6 (six) hours as needed for wheezing or shortness of breath.    Dispense:  150 mL    Refill:  1    Order Specific Question:  Supervising Provider    Answer:  Cassandria Anger [[id number]]  . predniSONE (DELTASONE) 10 MG tablet    Sig: Take 4 tablets ( total 40 mg) by mouth for 2 days; take 3 tablets ( total 30 mg) by mouth for 2 days; take 2 tablets ( total 20 mg) by mouth for 1 day; take 1 tablet ( total 10 mg) by mouth for 1 day.    Dispense:  17 tablet    Refill:  0    Order Specific Question:  Supervising Provider    Answer:  Cassandria Anger [[id number]]  . albuterol (PROVENTIL) (2.5 MG/3ML) 0.083% nebulizer solution 2.5 mg    Sig:   . DISCONTD: albuterol (PROVENTIL) (2.5 MG/3ML) 0.083% nebulizer solution 2.5 mg    Sig:      Start medications as prescribed and explained to patient on After Visit Summary ( AVS).  Risks, benefits, and alternatives of the medications and treatment plan prescribed today were discussed, and patient expressed understanding.   Education regarding symptom management and diagnosis given to patient.   Follow-up:Plan follow-up as discussed or as needed if any worsening symptoms or change in condition. No Follow-up on file.   Continue to follow with Hoyt Koch, MD for routine health maintenance.   [place] and I agreed with plan.   Mable Paris, FNP  25 minutes spent with patient. > 50% of time spent counseling patient related to asthma diagnosis.

## 2016-03-19 ENCOUNTER — Other Ambulatory Visit: Payer: Self-pay | Admitting: Family

## 2016-03-19 ENCOUNTER — Telehealth: Payer: Self-pay | Admitting: Family

## 2016-03-19 DIAGNOSIS — R05 Cough: Secondary | ICD-10-CM

## 2016-03-19 DIAGNOSIS — R059 Cough, unspecified: Secondary | ICD-10-CM

## 2016-03-19 MED ORDER — AZITHROMYCIN 250 MG PO TABS: ORAL_TABLET | ORAL | Status: DC

## 2016-03-19 NOTE — Telephone Encounter (Signed)
netty pot is used to clean out the sinuses but it did not help Nasal congestion is yellow and side changes Sore throat and coughing. Patient is still having a low grade fever without chills.  Patient has been using q4-6h - tx only helped a little.  Prednisone helped in the beginning but not as much. Patient finishes it tomorrow.

## 2016-03-19 NOTE — Telephone Encounter (Signed)
Message     ----- Message from Burnard Hawthorne, [place] sent at 03/19/2016 1:03 PM EDT -----    Understand she called about a new medication and worsening. What are her symptoms? Did the prednisone not help her wheezing? How Often she uses a nebulizer treatment? Any fever and chills?        I don't know of medication per say to just send to the pharmacy, it sounds like she needs to be reevaluated.

## 2016-03-19 NOTE — Telephone Encounter (Signed)
Verified pharmacy is [facility] on [address]  Patient called to advise that her sx have not improved. At night she is worse than she was to begin with. She is asking if we are able to call something in to her pharmacy

## 2016-03-20 NOTE — Progress Notes (Signed)
Patient informed abx rx has been sent to pharmacy.

## 2016-03-26 DIAGNOSIS — G894 Chronic pain syndrome: Secondary | ICD-10-CM | POA: Diagnosis not present

## 2016-03-26 DIAGNOSIS — M5030 Other cervical disc degeneration, unspecified cervical region: Secondary | ICD-10-CM | POA: Diagnosis not present

## 2016-03-26 DIAGNOSIS — M5136 Other intervertebral disc degeneration, lumbar region: Secondary | ICD-10-CM | POA: Diagnosis not present

## 2016-04-03 ENCOUNTER — Ambulatory Visit (INDEPENDENT_AMBULATORY_CARE_PROVIDER_SITE_OTHER): Payer: Medicare Other | Admitting: Internal Medicine

## 2016-04-03 ENCOUNTER — Encounter: Payer: Self-pay | Admitting: Internal Medicine

## 2016-04-03 VITALS — BP 130/72 | HR 111 | Temp 98.8°F | Wt 182.0 lb

## 2016-04-03 DIAGNOSIS — K047 Periapical abscess without sinus: Secondary | ICD-10-CM | POA: Diagnosis not present

## 2016-04-03 DIAGNOSIS — R61 Generalized hyperhidrosis: Secondary | ICD-10-CM | POA: Diagnosis not present

## 2016-04-03 DIAGNOSIS — IMO0001 Reserved for inherently not codable concepts without codable children: Secondary | ICD-10-CM | POA: Insufficient documentation

## 2016-04-03 MED ORDER — KETOROLAC TROMETHAMINE 60 MG/2ML IM SOLN
60.0000 mg | Freq: Once | INTRAMUSCULAR | Status: AC
  Administered 2016-04-03: 60 mg via INTRAMUSCULAR

## 2016-04-03 MED ORDER — IBUPROFEN 800 MG PO TABS: 800.0000 mg | ORAL_TABLET | Freq: Three times a day (TID) | ORAL | Status: DC | PRN

## 2016-04-03 MED ORDER — PENICILLIN V POTASSIUM 500 MG PO TABS: 500.0000 mg | ORAL_TABLET | Freq: Four times a day (QID) | ORAL | Status: DC

## 2016-04-03 MED ORDER — CEFTRIAXONE SODIUM 1 G IJ SOLR
1.0000 g | Freq: Once | INTRAMUSCULAR | Status: AC
  Administered 2016-04-03: 1 g via INTRAMUSCULAR

## 2016-04-03 NOTE — Progress Notes (Signed)
Pre visit review using our clinic review tool, if applicable. No additional management support is needed unless otherwise documented below in the visit note. 

## 2016-04-03 NOTE — Assessment & Plan Note (Addendum)
5/17 due to tooth infection. Does not appear septic. To ER if worse... Rocephin IM 1 g Toradol IM PCN VK po starting tomorrow. Ibuprofen 800 mg tid prn Move up dental appt for ASAP

## 2016-04-03 NOTE — Progress Notes (Signed)
Subjective:  [name], female    DOB: Jan 10, 1971  Age: 45 y.o. MRN: [id number]  CC: No chief complaint on file.   HPI [employer] presents for a possible tooth abscess on a lower jaw on the R - 9/10 C/o sweating She has an appt w/oral surgeon for May 30th - they can't see her sooner  Outpatient Prescriptions Prior to Visit  Medication Sig Dispense Refill  . albuterol (PROVENTIL) (2.5 MG/3ML) 0.083% nebulizer solution Take 3 mLs (2.5 mg total) by nebulization every 6 (six) hours as needed for wheezing or shortness of breath. 150 mL 1  . diclofenac sodium (VOLTAREN) 1 % GEL Apply 2 g topically 3 (three) times daily as needed (for pain).    . EQ LORATADINE 10 MG tablet TAKE ONE TABLET BY MOUTH ONCE DAILY 30 tablet 11  . EQ STOOL SOFTENER/LAXATIVE 8.6-50 MG tablet TAKE ONE TABLET BY MOUTH ONCE DAILY 30 tablet 0  . fluticasone (FLONASE) 50 MCG/ACT nasal spray USE TWO SPRAY(S) IN EACH NOSTRIL ONCE DAILY IN THE MORNING 16 g 1  . fluticasone (FLONASE) 50 MCG/ACT nasal spray USE TWO SPRAY(S) IN EACH NOSTRIL ONCE DAILY 16 g 3  . gabapentin (NEURONTIN) 300 MG capsule Take 300 mg by mouth 2 (two) times daily.    Marland Kitchen losartan (COZAAR) 50 MG tablet TAKE ONE TABLET BY MOUTH ONCE DAILY 90 tablet 0  . metaxalone (SKELAXIN) 800 MG tablet Take 800 mg by mouth 3 (three) times daily.    . Multiple Vitamin (MULTIVITAMIN WITH MINERALS) TABS tablet Take 1 tablet by mouth daily. Reported on 12/16/2015    . PROAIR HFA 108 (90 BASE) MCG/ACT inhaler INHALE TWO PUFFS BY MOUTH EVERY 6 HOURS AS NEEDED FOR WHEEZING OR SHORTNESS OF BREATH 18 each 5  . PROCTOSOL HC 2.5 % rectal cream APPLY CREAM RECTALLY 2 TIMES DAILY 29 g 1  . ranitidine (ZANTAC) 150 MG tablet Take 150 mg by mouth 2 (two) times daily.    . SENNA-S 8.6-50 MG tablet TAKE ONE TABLET BY MOUTH ONCE DAILY 30 tablet 0  . SENNA-S 8.6-50 MG tablet TAKE ONE TABLET BY MOUTH ONCE DAILY 30 tablet 11  . sertraline (ZOLOFT) 100 MG tablet Take  100 mg by mouth daily.     Marland Kitchen tiZANidine (ZANAFLEX) 4 MG tablet Take 4 mg by mouth 3 (three) times daily.    Marland Kitchen HYDROcodone-acetaminophen (NORCO) 10-325 MG per tablet Take 1 tablet by mouth 3 (three) times daily. Reported on 04/03/2016    . azithromycin (ZITHROMAX) 250 MG tablet Tale 500 mg PO on day 1, then 250 mg PO q24h x 4 days. (Patient not taking: Reported on 04/03/2016) 6 tablet 0  . predniSONE (DELTASONE) 10 MG tablet Take 4 tablets ( total 40 mg) by mouth for 2 days; take 3 tablets ( total 30 mg) by mouth for 2 days; take 2 tablets ( total 20 mg) by mouth for 1 day; take 1 tablet ( total 10 mg) by mouth for 1 day. (Patient not taking: Reported on 04/03/2016) 17 tablet 0   No facility-administered medications prior to visit.    ROS Review of Systems  Constitutional: Positive for chills and diaphoresis. Negative for activity change, appetite change, fatigue and unexpected weight change.  HENT: Positive for dental problem. Negative for congestion, ear discharge, mouth sores and sinus pressure.   Eyes: Negative for visual disturbance.  Respiratory: Negative for cough and chest tightness.   Gastrointestinal: Negative for nausea and abdominal pain.  Genitourinary: Negative for frequency, difficulty urinating and vaginal pain.  Musculoskeletal: Negative for back pain, gait problem and neck pain.  Skin: Negative for pallor, rash and wound.  Neurological: Negative for dizziness, tremors, weakness, numbness and headaches.  Psychiatric/Behavioral: Positive for sleep disturbance. Negative for confusion.    Objective:  BP 130/72 mmHg  Pulse 111  Temp(Src) 98.8 F (37.1 C) (Oral)  Wt 182 lb (82.555 kg)  SpO2 95%  BP Readings from Last 3 Encounters:  04/03/16 130/72  03/15/16 160/98  12/16/15 124/84    Wt Readings from Last 3 Encounters:  04/03/16 182 lb (82.555 kg)  03/15/16 187 lb (84.823 kg)  12/16/15 186 lb (84.369 kg)    Physical Exam  Constitutional: She appears well-developed.  No distress.  HENT:  Head: Normocephalic.  Right Ear: External ear normal.  Left Ear: External ear normal.  Nose: Nose normal.  Mouth/Throat: Oropharynx is clear and moist.  Eyes: Conjunctivae are normal. Pupils are equal, round, and reactive to light. Right eye exhibits no discharge. Left eye exhibits no discharge.  Neck: Normal range of motion. Neck supple. No JVD present. No tracheal deviation present. No thyromegaly present.  Cardiovascular: Normal rate, regular rhythm and normal heart sounds.   Pulmonary/Chest: No stridor. No respiratory distress. She has no wheezes.  Abdominal: Soft. Bowel sounds are normal. She exhibits no distension and no mass. There is no tenderness. There is no rebound and no guarding.  Musculoskeletal: She exhibits no edema or tenderness.  Lymphadenopathy:    She has no cervical adenopathy.  Neurological: She displays normal reflexes. No cranial nerve deficit. She exhibits normal muscle tone. Coordination normal.  Skin: No rash noted. No erythema.  Psychiatric: She has a normal mood and affect. Her behavior is normal. Judgment and thought content normal.   Sweaty R chin is tender Lower incisors decayed stumps  Lab Results  Component Value Date   WBC 10.6* 12/16/2015   HGB 12.4 12/16/2015   HCT 38.2 12/16/2015   PLT 300.0 12/16/2015   GLUCOSE 100* 06/14/2015   CHOL 150 06/14/2015   TRIG 367.0* 06/14/2015   HDL 23.20* 06/14/2015   LDLDIRECT 74.0 06/14/2015   ALT 10 06/14/2015   AST 14 06/14/2015   NA 140 06/14/2015   K 3.5 06/14/2015   CL 105 06/14/2015   CREATININE 0.72 06/14/2015   BUN 7 06/14/2015   CO2 27 06/14/2015   TSH 0.79 12/16/2015   HGBA1C 5.1 06/14/2015    Mr Cervical Spine Wo Contrast  02/06/2016  CLINICAL DATA:  Neck pain running down both arms to the elbows, present intermittently for 2 years. Prior cervical spine fusion and Chiari decompression. EXAM: MRI CERVICAL SPINE WITHOUT CONTRAST TECHNIQUE: Multiplanar, multisequence MR  imaging of the cervical spine was performed. No intravenous contrast was administered. COMPARISON:  10/05/2013 FINDINGS: Sequelae of prior suboccipital craniectomy and C1 laminectomy for Chiari decompression are again identified. There is straightening of the normal cervical lordosis. There is no listhesis. Sequelae of interval C6-T1 ACDF are identified. Vertebral body heights are preserved. No significant vertebral marrow edema is seen. Diffusely decreased bone marrow signal intensity is unchanged and nonspecific though can be seen with anemia. The cervical spinal cord is normal in caliber and signal. A 2.1 cm T2 hyperintense left thyroid nodule is grossly similar to the prior study and has been evaluated with biopsy in the interim. C2-3 through C4-5:  No disc herniation or stenosis. C5-6: Minimal disc bulging and shallow central disc protrusion without stenosis, unchanged. C6-7: Interval ACDF.  Mild uncovertebral spurring results in at most minimal bilateral neural foraminal narrowing. No spinal stenosis. C7-T1: Interval ACDF. Uncovertebral spurring results in mild residual left neural foraminal stenosis, improved from prior. No spinal stenosis. Minimal right neural foraminal narrowing. IMPRESSION: 1. Interval C6-T1 ACDF. No spinal stenosis. Mild residual left neural foraminal narrowing at C7-T1. 2. Minimal disc bulging/shallow protrusion at C5-6 without stenosis, unchanged. Electronically Signed   By: Logan Bores M.D.   On: 02/06/2016 20:11    Assessment & Plan:   There are no diagnoses linked to this encounter. I have discontinued Ms. Auerbach's predniSONE and azithromycin. I am also having her maintain her sertraline, gabapentin, diclofenac sodium, multivitamin with minerals, fluticasone, ranitidine, HYDROcodone-acetaminophen, metaxalone, tiZANidine, EQ LORATADINE, PROAIR HFA, SENNA-S, EQ STOOL SOFTENER/LAXATIVE, SENNA-S, fluticasone, PROCTOSOL HC, losartan, albuterol, and Oxycodone HCl.  Meds ordered  this encounter  Medications  . Oxycodone HCl 10 MG TABS    Sig: Take 1 tablet by mouth every 8 (eight) hours as needed.     Follow-up: No Follow-up on file.  Walker Kehr, MD

## 2016-04-03 NOTE — Assessment & Plan Note (Signed)
Rocephin IM 1 g Toradol IM PCN VK po starting tomorrow. Ibuprofen 800 mg tid prn Move up dental appt for ASAP

## 2016-04-03 NOTE — Addendum Note (Signed)
Addended by: Cresenciano Lick on: 04/03/2016 03:37 PM   Modules accepted: Orders

## 2016-05-24 ENCOUNTER — Other Ambulatory Visit: Payer: Self-pay | Admitting: Internal Medicine

## 2016-05-24 DIAGNOSIS — M5417 Radiculopathy, lumbosacral region: Secondary | ICD-10-CM | POA: Diagnosis not present

## 2016-05-24 DIAGNOSIS — M5136 Other intervertebral disc degeneration, lumbar region: Secondary | ICD-10-CM | POA: Diagnosis not present

## 2016-05-24 DIAGNOSIS — G894 Chronic pain syndrome: Secondary | ICD-10-CM | POA: Diagnosis not present

## 2016-05-24 DIAGNOSIS — M5030 Other cervical disc degeneration, unspecified cervical region: Secondary | ICD-10-CM | POA: Diagnosis not present

## 2016-06-13 ENCOUNTER — Ambulatory Visit: Payer: Medicare Other | Admitting: Internal Medicine

## 2016-06-21 DIAGNOSIS — M791 Myalgia: Secondary | ICD-10-CM | POA: Diagnosis not present

## 2016-06-21 DIAGNOSIS — M5136 Other intervertebral disc degeneration, lumbar region: Secondary | ICD-10-CM | POA: Diagnosis not present

## 2016-06-26 ENCOUNTER — Other Ambulatory Visit: Payer: Self-pay | Admitting: Internal Medicine

## 2016-06-27 ENCOUNTER — Encounter: Payer: Self-pay | Admitting: Internal Medicine

## 2016-06-27 ENCOUNTER — Other Ambulatory Visit (INDEPENDENT_AMBULATORY_CARE_PROVIDER_SITE_OTHER): Payer: Medicare Other

## 2016-06-27 ENCOUNTER — Ambulatory Visit (INDEPENDENT_AMBULATORY_CARE_PROVIDER_SITE_OTHER): Payer: Medicare Other | Admitting: Internal Medicine

## 2016-06-27 VITALS — BP 120/72 | HR 91 | Temp 99.2°F | Resp 14 | Ht 59.0 in | Wt 181.4 lb

## 2016-06-27 DIAGNOSIS — F418 Other specified anxiety disorders: Secondary | ICD-10-CM

## 2016-06-27 DIAGNOSIS — I10 Essential (primary) hypertension: Secondary | ICD-10-CM | POA: Diagnosis not present

## 2016-06-27 DIAGNOSIS — E038 Other specified hypothyroidism: Secondary | ICD-10-CM

## 2016-06-27 LAB — T4, FREE: Free T4: 0.85 ng/dL (ref 0.60–1.60)

## 2016-06-27 LAB — CBC
HCT: 37.2 % (ref 36.0–46.0)
Hemoglobin: 12.3 g/dL (ref 12.0–15.0)
MCHC: 32.9 g/dL (ref 30.0–36.0)
MCV: 80.1 fl (ref 78.0–100.0)
Platelets: 328 10*3/uL (ref 150.0–400.0)
RBC: 4.65 Mil/uL (ref 3.87–5.11)
RDW: 14.5 % (ref 11.5–15.5)
WBC: 13.2 10*3/uL — ABNORMAL HIGH (ref 4.0–10.5)

## 2016-06-27 LAB — TSH: TSH: 0.79 u[IU]/mL (ref 0.35–4.50)

## 2016-06-27 MED ORDER — ESCITALOPRAM OXALATE 10 MG PO TABS: 10.0000 mg | ORAL_TABLET | Freq: Every day | ORAL | 6 refills | Status: DC

## 2016-06-27 MED ORDER — DILTIAZEM HCL ER 120 MG PO CP24: 120.0000 mg | ORAL_CAPSULE | Freq: Every day | ORAL | 3 refills | Status: DC

## 2016-06-27 NOTE — Progress Notes (Signed)
Pre visit review using our clinic review tool, if applicable. No additional management support is needed unless otherwise documented below in the visit note. 

## 2016-06-27 NOTE — Patient Instructions (Addendum)
We have sent in a medicine called diltiazem that you take once a day to help with the blood pressure and the heart palpitations.   We have also sent in a medicine called lexapro to replace the zoloft. Stop taking zoloft and start taking the new medicine.   We would like to see you back in about 6 weeks to check on the blood pressure as well as to see how you are doing.   Stress and Stress Management Stress is a normal reaction to life events. It is what you feel when life demands more than you are used to or more than you can handle. Some stress can be useful. For example, the stress reaction can help you catch the last bus of the day, study for a test, or meet a deadline at work. But stress that occurs too often or for too long can cause problems. It can affect your emotional health and interfere with relationships and normal daily activities. Too much stress can weaken your immune system and increase your risk for physical illness. If you already have a medical problem, stress can make it worse. CAUSES  All sorts of life events may cause stress. An event that causes stress for one person may not be stressful for another person. Major life events commonly cause stress. These may be positive or negative. Examples include losing your job, moving into a new home, getting married, having a baby, or losing a loved one. Less obvious life events may also cause stress, especially if they occur day after day or in combination. Examples include working long hours, driving in traffic, caring for children, being in debt, or being in a difficult relationship. SIGNS AND SYMPTOMS Stress may cause emotional symptoms including, the following:  Anxiety. This is feeling worried, afraid, on edge, overwhelmed, or out of control.  Anger. This is feeling irritated or impatient.  Depression. This is feeling sad, down, helpless, or guilty.  Difficulty focusing, remembering, or making decisions. Stress may cause physical  symptoms, including the following:   Aches and pains. These may affect your head, neck, back, stomach, or other areas of your body.  Tight muscles or clenched jaw.  Low energy or trouble sleeping. Stress may cause unhealthy behaviors, including the following:   Eating to feel better (overeating) or skipping meals.  Sleeping too little, too much, or both.  Working too much or putting off tasks (procrastination).  Smoking, drinking alcohol, or using drugs to feel better. DIAGNOSIS  Stress is diagnosed through an assessment by your health care provider. Your health care provider will ask questions about your symptoms and any stressful life events.Your health care provider will also ask about your medical history and may order blood tests or other tests. Certain medical conditions and medicine can cause physical symptoms similar to stress. Mental illness can cause emotional symptoms and unhealthy behaviors similar to stress. Your health care provider may refer you to a mental health professional for further evaluation.  TREATMENT  Stress management is the recommended treatment for stress.The goals of stress management are reducing stressful life events and coping with stress in healthy ways.  Techniques for reducing stressful life events include the following:  Stress identification. Self-monitor for stress and identify what causes stress for you. These skills may help you to avoid some stressful events.  Time management. Set your priorities, keep a calendar of events, and learn to say "no." These tools can help you avoid making too many commitments. Techniques for coping with stress  include the following:  Rethinking the problem. Try to think realistically about stressful events rather than ignoring them or overreacting. Try to find the positives in a stressful situation rather than focusing on the negatives.  Exercise. Physical exercise can release both physical and emotional tension.  The key is to find a form of exercise you enjoy and do it regularly.  Relaxation techniques. These relax the body and mind. Examples include yoga, meditation, tai chi, biofeedback, deep breathing, progressive muscle relaxation, listening to music, being out in nature, journaling, and other hobbies. Again, the key is to find one or more that you enjoy and can do regularly.  Healthy lifestyle. Eat a balanced diet, get plenty of sleep, and do not smoke. Avoid using alcohol or drugs to relax.  Strong support network. Spend time with family, friends, or other people you enjoy being around.Express your feelings and talk things over with someone you trust. Counseling or talktherapy with a mental health professional may be helpful if you are having difficulty managing stress on your own. Medicine is typically not recommended for the treatment of stress.Talk to your health care provider if you think you need medicine for symptoms of stress. HOME CARE INSTRUCTIONS  Keep all follow-up visits as directed by your health care provider.  Take all medicines as directed by your health care provider. SEEK MEDICAL CARE IF:  Your symptoms get worse or you start having new symptoms.  You feel overwhelmed by your problems and can no longer manage them on your own. SEEK IMMEDIATE MEDICAL CARE IF:  You feel like hurting yourself or someone else.   This information is not intended to replace advice given to you by your health care provider. Make sure you discuss any questions you have with your health care provider.   Document Released: 05/01/2001 Document Revised: 11/26/2014 Document Reviewed: 06/30/2013 Elsevier Interactive Patient Education [employer].

## 2016-06-27 NOTE — Progress Notes (Signed)
   Subjective:    [name], female    DOB: [birth year], 45 y.o.   MRN: [id number]  HPI The patient is a 45 YO female coming in for poorly controlled depression (another doctor has her on zoloft which is not helping well, worse recently with some new stress in her life, giving her some panic and anxiety and having heart racing at times, previously was on diltiazem for her BP which worked well for both) and her blood pressure (BP is running high at home and she is still taking her losartan, no side effects). Denies SI/HI.   Review of Systems  Constitutional: Positive for activity change. Negative for appetite change, fatigue and fever.  Respiratory: Negative for cough, chest tightness, shortness of breath and wheezing.   Cardiovascular: Positive for palpitations. Negative for chest pain and leg swelling.  Gastrointestinal: Negative for diarrhea, nausea and vomiting.  Skin: Negative.   Neurological: Negative for dizziness, weakness and light-headedness.  Psychiatric/Behavioral: Positive for decreased concentration and dysphoric mood. Negative for self-injury, sleep disturbance and suicidal ideas. The patient is nervous/anxious.       Objective:   Physical Exam  Constitutional: She is oriented to person, place, and time. She appears well-developed and well-nourished.  Overweight  HENT:  Head: Normocephalic and atraumatic.  Eyes: EOM are normal.  Neck: Normal range of motion.  Cardiovascular: Normal rate and regular rhythm.   Pulmonary/Chest: Effort normal. No respiratory distress. She has no wheezes.  Abdominal: Soft. She exhibits no distension. There is no tenderness. There is no rebound and no guarding.  Musculoskeletal: She exhibits no edema.  Neurological: She is alert and oriented to person, place, and time.  Skin: Skin is warm and dry.  Psychiatric:  Anxious during the visit and somewhat flat affect   Vitals:   06/27/16 1020  BP: 120/72  Pulse: 91  Resp: 14    Temp: 99.2 F (37.3 C)  TempSrc: Oral  SpO2: 95%  Weight: 181 lb 6.4 oz (82.3 kg)  Height: 4\' 11"  (1.499 m)      Assessment & Plan:

## 2016-06-29 NOTE — Assessment & Plan Note (Signed)
Will stop her zoloft which is not helping and add lexapro for better control. We talked about trying counseling if she feels able as well as coping techniques to help with stress and encouraged to start an exercise program.

## 2016-06-29 NOTE — Assessment & Plan Note (Signed)
Will add diltiazem for better control and continue losartan. Recheck in 1 month.

## 2016-07-12 DIAGNOSIS — M5417 Radiculopathy, lumbosacral region: Secondary | ICD-10-CM | POA: Diagnosis not present

## 2016-07-24 DIAGNOSIS — M5117 Intervertebral disc disorders with radiculopathy, lumbosacral region: Secondary | ICD-10-CM | POA: Diagnosis not present

## 2016-07-24 DIAGNOSIS — M5116 Intervertebral disc disorders with radiculopathy, lumbar region: Secondary | ICD-10-CM | POA: Diagnosis not present

## 2016-07-24 DIAGNOSIS — M4727 Other spondylosis with radiculopathy, lumbosacral region: Secondary | ICD-10-CM | POA: Diagnosis not present

## 2016-07-24 DIAGNOSIS — M4726 Other spondylosis with radiculopathy, lumbar region: Secondary | ICD-10-CM | POA: Diagnosis not present

## 2016-07-26 ENCOUNTER — Other Ambulatory Visit: Payer: Self-pay | Admitting: Internal Medicine

## 2016-08-13 ENCOUNTER — Ambulatory Visit: Payer: Medicare Other | Admitting: Internal Medicine

## 2016-08-16 ENCOUNTER — Ambulatory Visit: Payer: Medicare Other | Admitting: Internal Medicine

## 2016-08-22 ENCOUNTER — Ambulatory Visit (INDEPENDENT_AMBULATORY_CARE_PROVIDER_SITE_OTHER): Payer: Medicare Other | Admitting: Internal Medicine

## 2016-08-22 ENCOUNTER — Encounter: Payer: Self-pay | Admitting: Internal Medicine

## 2016-08-22 DIAGNOSIS — F418 Other specified anxiety disorders: Secondary | ICD-10-CM | POA: Diagnosis not present

## 2016-08-22 DIAGNOSIS — Z23 Encounter for immunization: Secondary | ICD-10-CM | POA: Diagnosis not present

## 2016-08-22 DIAGNOSIS — I10 Essential (primary) hypertension: Secondary | ICD-10-CM

## 2016-08-22 NOTE — Progress Notes (Signed)
   Subjective:    [name], female    DOB: [birth year], 45 y.o.   MRN: [id number]  HPI The patient is a 45 YO female coming in for follow up of her depression (switched from zoloft to lexapro about 2 months ago, she mixed up her bottles and is not taking it now, may be taking zoloft, mood about the same, more stress in her life, denies SI/HI) and her blood pressure (added diltiazem for some palpitations and still taking losartan as well). No more palpitations since the diltiazem started.   Review of Systems  Constitutional: Negative for activity change, appetite change, fatigue and fever.  Respiratory: Negative for cough, chest tightness, shortness of breath and wheezing.   Cardiovascular: Negative for chest pain, palpitations and leg swelling.  Gastrointestinal: Negative for diarrhea, nausea and vomiting.  Neurological: Negative for dizziness, weakness and light-headedness.  Psychiatric/Behavioral: Positive for decreased concentration and dysphoric mood. Negative for behavioral problems, confusion, self-injury, sleep disturbance and suicidal ideas. The patient is nervous/anxious.       Objective:   Physical Exam  Constitutional: She is oriented to person, place, and time. She appears well-developed and well-nourished.  Overweight  HENT:  Head: Normocephalic and atraumatic.  Eyes: EOM are normal.  Neck: Normal range of motion.  Cardiovascular: Normal rate and regular rhythm.   Pulmonary/Chest: Effort normal. No respiratory distress. She has no wheezes.  Abdominal: Soft. She exhibits no distension. There is no tenderness.  Neurological: She is alert and oriented to person, place, and time.  Skin: Skin is warm and dry.  Psychiatric:  Affect mildly better during the visit.    Vitals:   08/22/16 1014  BP: (!) 142/90  Pulse: 75  Resp: 12  Temp: 98.5 F (36.9 C)  TempSrc: Oral  SpO2: 96%  Weight: 180 lb (81.6 kg)  Height: 4\' 11"  (1.499 m)      Assessment &  Plan:  Flu shot given at visit.

## 2016-08-22 NOTE — Assessment & Plan Note (Signed)
She has not changed medicines. No change in her symptoms. We talked about the safety in the lexapro and encouraged her to start taking this medication for better control of her symptoms. She is still having moderate depression at this time. Mildly less anxiety without the palpitations.

## 2016-08-22 NOTE — Assessment & Plan Note (Signed)
BP at goal, no palpitations since starting the diltiazem. Still taking losartan as well. Adjust as needed.

## 2016-08-22 NOTE — Progress Notes (Signed)
Pre visit review using our clinic review tool, if applicable. No additional management support is needed unless otherwise documented below in the visit note. 

## 2016-08-22 NOTE — Patient Instructions (Signed)
It is okay to start taking the lexapro to see if it helps with the mood.   If you get the itching again it is okay to take benadryl.  We have given you the flu shot today.

## 2016-08-23 DIAGNOSIS — M47816 Spondylosis without myelopathy or radiculopathy, lumbar region: Secondary | ICD-10-CM | POA: Diagnosis not present

## 2016-08-25 ENCOUNTER — Other Ambulatory Visit: Payer: Self-pay | Admitting: Internal Medicine

## 2016-09-13 ENCOUNTER — Other Ambulatory Visit: Payer: Self-pay

## 2016-09-13 ENCOUNTER — Other Ambulatory Visit: Payer: Self-pay | Admitting: Internal Medicine

## 2016-09-13 ENCOUNTER — Telehealth: Payer: Self-pay | Admitting: Internal Medicine

## 2016-09-13 DIAGNOSIS — Z1231 Encounter for screening mammogram for malignant neoplasm of breast: Secondary | ICD-10-CM

## 2016-09-13 DIAGNOSIS — N6009 Solitary cyst of unspecified breast: Secondary | ICD-10-CM

## 2016-09-13 NOTE — Telephone Encounter (Signed)
Pt stating she need [facility] due to cyst. Please advise.

## 2016-09-14 NOTE — Telephone Encounter (Signed)
Order placed to [facility], if needs to be changed let me know.

## 2016-09-21 ENCOUNTER — Telehealth: Payer: Self-pay | Admitting: Internal Medicine

## 2016-09-21 DIAGNOSIS — M47816 Spondylosis without myelopathy or radiculopathy, lumbar region: Secondary | ICD-10-CM | POA: Diagnosis not present

## 2016-09-21 DIAGNOSIS — N6002 Solitary cyst of left breast: Secondary | ICD-10-CM

## 2016-09-21 NOTE — Telephone Encounter (Signed)
mammogram order needs to be changed to IMG [id number] and ultra sound of breast code needs to be entered in and which breast indicated.

## 2016-09-21 NOTE — Telephone Encounter (Signed)
Please call patient and find out which breast with cyst.

## 2016-09-24 NOTE — Telephone Encounter (Signed)
Orders placed.

## 2016-09-24 NOTE — Telephone Encounter (Signed)
Please call Kristeen Miss at [facility]. At [phone]. Follow up on order that needs to be faxed over.  Trying to get patient scheduled.

## 2016-09-24 NOTE — Telephone Encounter (Signed)
Need an order for diagnostic breast tomo bilateral, IMG code is [id number]. Also, need an order for a left breast ultrasound.

## 2016-09-25 ENCOUNTER — Other Ambulatory Visit: Payer: Self-pay | Admitting: Internal Medicine

## 2016-09-26 ENCOUNTER — Ambulatory Visit
Admission: RE | Admit: 2016-09-26 | Discharge: 2016-09-26 | Disposition: A | Discharge status: Home / Self Care | Payer: Medicare Other | Source: Ambulatory Visit | Attending: Internal Medicine | Admitting: Internal Medicine

## 2016-09-26 ENCOUNTER — Telehealth: Payer: Self-pay | Admitting: Emergency Medicine

## 2016-09-26 ENCOUNTER — Other Ambulatory Visit: Payer: Self-pay | Admitting: Internal Medicine

## 2016-09-26 DIAGNOSIS — R229 Localized swelling, mass and lump, unspecified: Principal | ICD-10-CM

## 2016-09-26 DIAGNOSIS — N6012 Diffuse cystic mastopathy of left breast: Secondary | ICD-10-CM | POA: Diagnosis not present

## 2016-09-26 DIAGNOSIS — N6002 Solitary cyst of left breast: Secondary | ICD-10-CM

## 2016-09-26 DIAGNOSIS — IMO0002 Reserved for concepts with insufficient information to code with codable children: Secondary | ICD-10-CM

## 2016-09-26 DIAGNOSIS — N6009 Solitary cyst of unspecified breast: Secondary | ICD-10-CM

## 2016-09-26 DIAGNOSIS — N6001 Solitary cyst of right breast: Secondary | ICD-10-CM | POA: Diagnosis not present

## 2016-09-26 DIAGNOSIS — N6011 Diffuse cystic mastopathy of right breast: Secondary | ICD-10-CM | POA: Diagnosis not present

## 2016-09-26 DIAGNOSIS — N6321 Unspecified lump in the left breast, upper outer quadrant: Secondary | ICD-10-CM | POA: Diagnosis not present

## 2016-09-26 NOTE — Telephone Encounter (Signed)
Received call from [facility], states they now need an order for a R Breast US, verbal orders were given since pt was in the office and they could go forward with procedure. Please order.

## 2016-09-26 NOTE — Telephone Encounter (Signed)
Dr. [name] has placed orders.

## 2016-09-27 NOTE — Addendum Note (Signed)
Addended by: Pricilla Holm A on: 09/27/2016 11:41 AM   Modules accepted: Orders

## 2016-09-27 NOTE — Telephone Encounter (Signed)
This needs to be done for the RIGHT breast, left breast has already been entered.

## 2016-09-27 NOTE — Telephone Encounter (Signed)
Entered

## 2016-09-28 ENCOUNTER — Ambulatory Visit
Admission: RE | Admit: 2016-09-28 | Discharge: 2016-09-28 | Disposition: A | Discharge status: Home / Self Care | Payer: Medicare Other | Source: Ambulatory Visit | Attending: Internal Medicine | Admitting: Internal Medicine

## 2016-09-28 DIAGNOSIS — N6002 Solitary cyst of left breast: Secondary | ICD-10-CM

## 2016-10-22 DIAGNOSIS — M5030 Other cervical disc degeneration, unspecified cervical region: Secondary | ICD-10-CM | POA: Diagnosis not present

## 2016-10-22 DIAGNOSIS — G894 Chronic pain syndrome: Secondary | ICD-10-CM | POA: Diagnosis not present

## 2016-10-22 DIAGNOSIS — M47816 Spondylosis without myelopathy or radiculopathy, lumbar region: Secondary | ICD-10-CM | POA: Diagnosis not present

## 2016-10-22 DIAGNOSIS — Z5181 Encounter for therapeutic drug level monitoring: Secondary | ICD-10-CM | POA: Diagnosis not present

## 2016-10-22 DIAGNOSIS — M5136 Other intervertebral disc degeneration, lumbar region: Secondary | ICD-10-CM | POA: Diagnosis not present

## 2016-10-22 DIAGNOSIS — Z79899 Other long term (current) drug therapy: Secondary | ICD-10-CM | POA: Diagnosis not present

## 2016-11-16 DIAGNOSIS — M791 Myalgia: Secondary | ICD-10-CM | POA: Diagnosis not present

## 2016-11-16 DIAGNOSIS — M5030 Other cervical disc degeneration, unspecified cervical region: Secondary | ICD-10-CM | POA: Diagnosis not present

## 2016-11-22 ENCOUNTER — Ambulatory Visit: Payer: Medicare Other | Admitting: Internal Medicine

## 2016-11-30 ENCOUNTER — Ambulatory Visit: Payer: Medicare Other | Admitting: Internal Medicine

## 2016-12-04 ENCOUNTER — Ambulatory Visit: Payer: Medicare Other | Admitting: Internal Medicine

## 2016-12-07 ENCOUNTER — Ambulatory Visit: Payer: Medicare Other | Admitting: Internal Medicine

## 2016-12-13 ENCOUNTER — Encounter: Payer: Self-pay | Admitting: Internal Medicine

## 2016-12-13 ENCOUNTER — Ambulatory Visit (INDEPENDENT_AMBULATORY_CARE_PROVIDER_SITE_OTHER): Payer: Medicare Other | Admitting: Internal Medicine

## 2016-12-13 DIAGNOSIS — F418 Other specified anxiety disorders: Secondary | ICD-10-CM | POA: Diagnosis not present

## 2016-12-13 MED ORDER — ESCITALOPRAM OXALATE 20 MG PO TABS: 20.0000 mg | ORAL_TABLET | Freq: Every day | ORAL | 2 refills | Status: DC

## 2016-12-13 MED ORDER — BUSPIRONE HCL 5 MG PO TABS: 5.0000 mg | ORAL_TABLET | Freq: Two times a day (BID) | ORAL | 2 refills | Status: DC

## 2016-12-13 NOTE — Progress Notes (Signed)
Pre visit review using our clinic review tool, if applicable. No additional management support is needed unless otherwise documented below in the visit note. 

## 2016-12-13 NOTE — Patient Instructions (Signed)
We have sent in the higher dose of the lexapro.   We have also sent in the anxiety medicine called buspar which you can use if needed up to twice a day.   Call us back in about 3 weeks to let us know how you are doing.  Stress and Stress Management Stress is a normal reaction to life events. It is what you feel when life demands more than you are used to or more than you can handle. Some stress can be useful. For example, the stress reaction can help you catch the last bus of the day, study for a test, or meet a deadline at work. But stress that occurs too often or for too long can cause problems. It can affect your emotional health and interfere with relationships and normal daily activities. Too much stress can weaken your immune system and increase your risk for physical illness. If you already have a medical problem, stress can make it worse. What are the causes? All sorts of life events may cause stress. An event that causes stress for one person may not be stressful for another person. Major life events commonly cause stress. These may be positive or negative. Examples include losing your job, moving into a new home, getting married, having a baby, or losing a loved one. Less obvious life events may also cause stress, especially if they occur day after day or in combination. Examples include working long hours, driving in traffic, caring for children, being in debt, or being in a difficult relationship. What are the signs or symptoms? Stress may cause emotional symptoms including, the following:  Anxiety. This is feeling worried, afraid, on edge, overwhelmed, or out of control.  Anger. This is feeling irritated or impatient.  Depression. This is feeling sad, down, helpless, or guilty.  Difficulty focusing, remembering, or making decisions. Stress may cause physical symptoms, including the following:  Aches and pains. These may affect your head, neck, back, stomach, or other areas of your  body.  Tight muscles or clenched jaw.  Low energy or trouble sleeping. Stress may cause unhealthy behaviors, including the following:  Eating to feel better (overeating) or skipping meals.  Sleeping too little, too much, or both.  Working too much or putting off tasks (procrastination).  Smoking, drinking alcohol, or using drugs to feel better. How is this diagnosed? Stress is diagnosed through an assessment by your health care provider. Your health care provider will ask questions about your symptoms and any stressful life events.Your health care provider will also ask about your medical history and may order blood tests or other tests. Certain medical conditions and medicine can cause physical symptoms similar to stress. Mental illness can cause emotional symptoms and unhealthy behaviors similar to stress. Your health care provider may refer you to a mental health professional for further evaluation. How is this treated? Stress management is the recommended treatment for stress.The goals of stress management are reducing stressful life events and coping with stress in healthy ways. Techniques for reducing stressful life events include the following:  Stress identification. Self-monitor for stress and identify what causes stress for you. These skills may help you to avoid some stressful events.  Time management. Set your priorities, keep a calendar of events, and learn to say "no." These tools can help you avoid making too many commitments. Techniques for coping with stress include the following:  Rethinking the problem. Try to think realistically about stressful events rather than ignoring them or overreacting. Try to  find the positives in a stressful situation rather than focusing on the negatives.  Exercise. Physical exercise can release both physical and emotional tension. The key is to find a form of exercise you enjoy and do it regularly.  Relaxation techniques. These relax the  body and mind. Examples include yoga, meditation, tai chi, biofeedback, deep breathing, progressive muscle relaxation, listening to music, being out in nature, journaling, and other hobbies. Again, the key is to find one or more that you enjoy and can do regularly.  Healthy lifestyle. Eat a balanced diet, get plenty of sleep, and do not smoke. Avoid using alcohol or drugs to relax.  Strong support network. Spend time with family, friends, or other people you enjoy being around.Express your feelings and talk things over with someone you trust. Counseling or talktherapy with a mental health professional may be helpful if you are having difficulty managing stress on your own. Medicine is typically not recommended for the treatment of stress.Talk to your health care provider if you think you need medicine for symptoms of stress. Follow these instructions at home:  Keep all follow-up visits as directed by your health care provider.  Take all medicines as directed by your health care provider. Contact a health care provider if:  Your symptoms get worse or you start having new symptoms.  You feel overwhelmed by your problems and can no longer manage them on your own. Get help right away if:  You feel like hurting yourself or someone else. This information is not intended to replace advice given to you by your health care provider. Make sure you discuss any questions you have with your health care provider. Document Released: 05/01/2001 Document Revised: 04/12/2016 Document Reviewed: 06/30/2013 Elsevier Interactive Patient Education  2017 [employer].

## 2016-12-13 NOTE — Progress Notes (Signed)
   Subjective:    [name], female    DOB: Jul 26, 1971, 46 y.o.   MRN: [id number]  HPI The patient is a 46 YO female coming in for follow up of her anxiety and depression. She was supposed to start on lexapro since last visit and she has done that. She is not having as much problems with her depression. She is still not where she would like to be with her symptoms. She is isolating herself and having worry about leaving the house. She is able to do some things at home. Still less energy.   Review of Systems  Constitutional: Positive for activity change, appetite change and fatigue. Negative for chills, fever and unexpected weight change.  Respiratory: Negative.   Cardiovascular: Negative.   Gastrointestinal: Negative.   Musculoskeletal: Negative.   Skin: Negative.   Psychiatric/Behavioral: Positive for decreased concentration, dysphoric mood and sleep disturbance. Negative for self-injury and suicidal ideas. The patient is nervous/anxious.       Objective:   Physical Exam  Constitutional: She is oriented to person, place, and time. She appears well-developed and well-nourished.  HENT:  Head: Normocephalic and atraumatic.  Eyes: EOM are normal.  Neck: Normal range of motion.  Cardiovascular: Normal rate and regular rhythm.   Pulmonary/Chest: Effort normal and breath sounds normal.  Abdominal: Soft.  Neurological: She is alert and oriented to person, place, and time.  Skin: Skin is warm and dry.  Psychiatric:  Some flat affect   Vitals:   12/13/16 0958  BP: 130/80  Pulse: 100  Resp: 14  Temp: 98.4 F (36.9 C)  TempSrc: Oral  SpO2: 96%  Weight: 174 lb (78.9 kg)  Height: 4\' 11"  (1.499 m)      Assessment & Plan:

## 2016-12-14 DIAGNOSIS — M47816 Spondylosis without myelopathy or radiculopathy, lumbar region: Secondary | ICD-10-CM | POA: Diagnosis not present

## 2016-12-15 NOTE — Assessment & Plan Note (Signed)
Increase lexapro to 20 mg daily for more benefit. Add buspar BID prn for anxiety to help her with anxiety. She will call in 3-4 weeks with progress report and if she is improved. If no improvement needs a different agent. Has tried and failed several agents and may be benefits from viibryd.

## 2016-12-17 ENCOUNTER — Other Ambulatory Visit: Payer: Self-pay | Admitting: Family

## 2016-12-17 DIAGNOSIS — J45901 Unspecified asthma with (acute) exacerbation: Secondary | ICD-10-CM

## 2016-12-25 ENCOUNTER — Telehealth: Payer: Self-pay

## 2016-12-25 MED ORDER — ALBUTEROL SULFATE (2.5 MG/3ML) 0.083% IN NEBU: 2.5000 mg | INHALATION_SOLUTION | Freq: Four times a day (QID) | RESPIRATORY_TRACT | 1 refills | Status: DC | PRN

## 2016-12-25 NOTE — Telephone Encounter (Signed)
Pharmacy refill request for albuterol neb solution. sent In refill

## 2017-01-16 DIAGNOSIS — B349 Viral infection, unspecified: Secondary | ICD-10-CM | POA: Diagnosis not present

## 2017-01-31 DIAGNOSIS — M5136 Other intervertebral disc degeneration, lumbar region: Secondary | ICD-10-CM | POA: Diagnosis not present

## 2017-01-31 DIAGNOSIS — G894 Chronic pain syndrome: Secondary | ICD-10-CM | POA: Diagnosis not present

## 2017-01-31 DIAGNOSIS — M5030 Other cervical disc degeneration, unspecified cervical region: Secondary | ICD-10-CM | POA: Diagnosis not present

## 2017-01-31 DIAGNOSIS — M5417 Radiculopathy, lumbosacral region: Secondary | ICD-10-CM | POA: Diagnosis not present

## 2017-02-21 ENCOUNTER — Other Ambulatory Visit: Payer: Self-pay | Admitting: Internal Medicine

## 2017-03-26 DIAGNOSIS — M5136 Other intervertebral disc degeneration, lumbar region: Secondary | ICD-10-CM | POA: Diagnosis not present

## 2017-03-26 DIAGNOSIS — M47816 Spondylosis without myelopathy or radiculopathy, lumbar region: Secondary | ICD-10-CM | POA: Diagnosis not present

## 2017-03-26 DIAGNOSIS — M5030 Other cervical disc degeneration, unspecified cervical region: Secondary | ICD-10-CM | POA: Diagnosis not present

## 2017-03-26 DIAGNOSIS — G894 Chronic pain syndrome: Secondary | ICD-10-CM | POA: Diagnosis not present

## 2017-03-29 ENCOUNTER — Other Ambulatory Visit: Payer: Self-pay | Admitting: Internal Medicine

## 2017-04-26 ENCOUNTER — Other Ambulatory Visit: Payer: Self-pay | Admitting: Internal Medicine

## 2017-04-26 DIAGNOSIS — Z79899 Other long term (current) drug therapy: Secondary | ICD-10-CM | POA: Diagnosis not present

## 2017-04-26 DIAGNOSIS — Z5181 Encounter for therapeutic drug level monitoring: Secondary | ICD-10-CM | POA: Diagnosis not present

## 2017-04-26 DIAGNOSIS — M5136 Other intervertebral disc degeneration, lumbar region: Secondary | ICD-10-CM | POA: Diagnosis not present

## 2017-05-06 ENCOUNTER — Ambulatory Visit (INDEPENDENT_AMBULATORY_CARE_PROVIDER_SITE_OTHER): Payer: Medicare Other | Admitting: Nurse Practitioner

## 2017-05-06 ENCOUNTER — Encounter: Payer: Self-pay | Admitting: Nurse Practitioner

## 2017-05-06 VITALS — BP 138/80 | HR 72 | Temp 99.5°F | Ht 59.0 in | Wt 174.0 lb

## 2017-05-06 DIAGNOSIS — I10 Essential (primary) hypertension: Secondary | ICD-10-CM | POA: Diagnosis not present

## 2017-05-06 DIAGNOSIS — F411 Generalized anxiety disorder: Secondary | ICD-10-CM | POA: Insufficient documentation

## 2017-05-06 MED ORDER — BUSPIRONE HCL 7.5 MG PO TABS: 7.5000 mg | ORAL_TABLET | Freq: Two times a day (BID) | ORAL | 3 refills | Status: DC

## 2017-05-06 NOTE — Progress Notes (Signed)
Subjective:  [name], female    DOB: [birth year]  Age: 46 y.o. MRN: [id number]  CC: Follow-up (BP recheck,med consult/has copy CD for back and neck to give. )   HPI HTN: Controlled with diltizem and losartan.  Anxiety: Describes feelings of irritability and labile emotions. Denies any SI or HI. Used the following medications in past: Viibryd, Wellbutrin, seroquel, zoloft used in past Stopped due to side effects: increased somnolence and nausea. No improvement with current dose of buspar and lexapro.  Outpatient Medications Prior to Visit  Medication Sig Dispense Refill  . albuterol (PROVENTIL) (2.5 MG/3ML) 0.083% nebulizer solution USE ONE VIAL IN NEBULIZER EVERY 6 HOURS AS NEEDED FOR WHEEZING OR SHORTNESS OF BREATH 150 mL 1  . cyclobenzaprine (FLEXERIL) 10 MG tablet Take 10 mg by mouth 3 (three) times daily as needed for muscle spasms.    . diclofenac sodium (VOLTAREN) 1 % GEL Apply 2 g topically 3 (three) times daily as needed (for pain).    Marland Kitchen DILT-XR 120 MG 24 hr capsule TAKE ONE CAPSULE BY MOUTH ONCE DAILY 30 capsule 3  . EQ LORATADINE 10 MG tablet TAKE ONE TABLET BY MOUTH ONCE DAILY 30 tablet 11  . EQ STOOL SOFTENER/LAXATIVE 8.6-50 MG tablet TAKE ONE TABLET BY MOUTH ONCE DAILY 30 tablet 0  . EQ STOOL SOFTENER/LAXATIVE 8.6-50 MG tablet TAKE ONE TABLET BY MOUTH ONCE DAILY 30 tablet 11  . escitalopram (LEXAPRO) 20 MG tablet Take 1 tablet (20 mg total) by mouth daily. 30 tablet 2  . fluticasone (FLONASE) 50 MCG/ACT nasal spray USE TWO SPRAY(S) IN EACH NOSTRIL ONCE DAILY 16 g 3  . gabapentin (NEURONTIN) 300 MG capsule Take 300 mg by mouth 2 (two) times daily.    Marland Kitchen losartan (COZAAR) 50 MG tablet TAKE ONE TABLET BY MOUTH ONCE DAILY 90 tablet 0  . metaxalone (SKELAXIN) 800 MG tablet Take 800 mg by mouth 3 (three) times daily.    . Oxycodone HCl 10 MG TABS Take 1 tablet by mouth every 8 (eight) hours as needed.    Marland Kitchen PROAIR HFA 108 (90 Base) MCG/ACT inhaler INHALE TWO  PUFFS BY MOUTH EVERY 6 HOURS AS NEEDED FOR WHEEZING OR SHORTNESS OF BREATH 18 each 5  . PROCTO-MED HC 2.5 % rectal cream APPLY CREAM TOPICALLY RECTALLY 2 TIMES DAILY 30 g 1  . promethazine (PHENERGAN) 12.5 MG tablet Take 12.5 mg by mouth every 6 (six) hours as needed for nausea or vomiting.    . ranitidine (ZANTAC) 150 MG tablet Take 150 mg by mouth 2 (two) times daily.    . SENNA-S 8.6-50 MG tablet TAKE ONE TABLET BY MOUTH ONCE DAILY 30 tablet 0  . tiZANidine (ZANAFLEX) 4 MG tablet Take 4 mg by mouth 3 (three) times daily.    . busPIRone (BUSPAR) 5 MG tablet TAKE ONE TABLET BY MOUTH TWICE DAILY 60 tablet 2   No facility-administered medications prior to visit.     ROS See HPI  Objective:  BP 138/80   Pulse 72   Temp 99.5 F (37.5 C)   Ht 4\' 11"  (1.499 m)   Wt 174 lb (78.9 kg)   SpO2 97%   BMI 35.14 kg/m   BP Readings from Last 3 Encounters:  05/06/17 138/80  12/13/16 130/80  08/22/16 140/90    Wt Readings from Last 3 Encounters:  05/06/17 174 lb (78.9 kg)  12/13/16 174 lb (78.9 kg)  08/22/16 180 lb (81.6 kg)    Physical Exam  Constitutional:  She is oriented to person, place, and time. No distress.  Cardiovascular: Normal rate, regular rhythm and normal heart sounds.   Pulmonary/Chest: Effort normal and breath sounds normal.  Neurological: She is alert and oriented to person, place, and time.  Skin: Skin is warm and dry.  Vitals reviewed.   Lab Results  Component Value Date   WBC 13.2 (H) 06/27/2016   HGB 12.3 06/27/2016   HCT 37.2 06/27/2016   PLT 328.0 06/27/2016   GLUCOSE 100 (H) 06/14/2015   CHOL 150 06/14/2015   TRIG 367.0 (H) 06/14/2015   HDL 23.20 (L) 06/14/2015   LDLDIRECT 74.0 06/14/2015   ALT 10 06/14/2015   AST 14 06/14/2015   NA 140 06/14/2015   K 3.5 06/14/2015   CL 105 06/14/2015   CREATININE 0.72 06/14/2015   BUN 7 06/14/2015   CO2 27 06/14/2015   TSH 0.79 06/27/2016   HGBA1C 5.1 06/14/2015    US Breast Aspiration Left  Result  Date: 09/28/2016 CLINICAL DATA:  Left breast 2 o'clock cyst. EXAM: ULTRASOUND GUIDED LEFT BREAST CYST ASPIRATION COMPARISON:  Previous exams. PROCEDURE: Using sterile technique, 1% lidocaine, under direct ultrasound visualization, needle aspiration of left breast 2 o'clock cyst was performed. The cyst was noted to collapse after the aspiration. Benign-type fluid was aspirated and discarded. IMPRESSION: Ultrasound-guided aspiration of left breast 2 o'clock cyst No apparent complications. RECOMMENDATIONS: Screening mammogram in one year.(Code:[id number]) Electronically Signed   By: Fidela Salisbury M.D.   On: 09/28/2016 14:42    Assessment & Plan:   Emily [name] was seen today for follow-up.  Diagnoses and all orders for this visit:  Essential hypertension  Anxiety state -     busPIRone (BUSPAR) 7.5 MG tablet; Take 1 tablet (7.5 mg total) by mouth 2 (two) times daily.   I have changed Emily [name]'s busPIRone. I am also having her maintain her gabapentin, diclofenac sodium, ranitidine, metaxalone, tiZANidine, SENNA-S, EQ STOOL SOFTENER/LAXATIVE, Oxycodone HCl, PROAIR HFA, cyclobenzaprine, promethazine, EQ LORATADINE, escitalopram, DILT-XR, losartan, EQ STOOL SOFTENER/LAXATIVE, fluticasone, PROCTO-MED HC, and albuterol.  Meds ordered this encounter  Medications  . busPIRone (BUSPAR) 7.5 MG tablet    Sig: Take 1 tablet (7.5 mg total) by mouth 2 (two) times daily.    Dispense:  60 tablet    Refill:  3    Please consider 90 day supplies to promote better adherence    Order Specific Question:   Supervising Provider    Answer:   Cassandria Anger [[id number]]   Follow-up: Return in about 4 weeks (around 06/03/2017) for anxiety.Wilfred Lacy, NP

## 2017-05-06 NOTE — Patient Instructions (Signed)
Maintain current BP medications.   Generalized Anxiety Disorder, Adult Generalized anxiety disorder (GAD) is a mental health disorder. People with this condition constantly worry about everyday events. Unlike normal anxiety, worry related to GAD is not triggered by a specific event. These worries also do not fade or get better with time. GAD interferes with life functions, including relationships, work, and school. GAD can vary from mild to severe. People with severe GAD can have intense waves of anxiety with physical symptoms (panic attacks). What are the causes? The exact cause of GAD is not known. What increases the risk? This condition is more likely to develop in:  Women.  People who have a family history of anxiety disorders.  People who are very shy.  People who experience very stressful life events, such as the death of a loved one.  People who have a very stressful family environment.  What are the signs or symptoms? People with GAD often worry excessively about many things in their lives, such as their health and family. They may also be overly concerned about:  Doing well at work.  Being on time.  Natural disasters.  Friendships.  Physical symptoms of GAD include:  Fatigue.  Muscle tension or having muscle twitches.  Trembling or feeling shaky.  Being easily startled.  Feeling like your heart is pounding or racing.  Feeling out of breath or like you cannot take a deep breath.  Having trouble falling asleep or staying asleep.  Sweating.  Nausea, diarrhea, or irritable bowel syndrome (IBS).  Headaches.  Trouble concentrating or remembering facts.  Restlessness.  Irritability.  How is this diagnosed? Your health care provider can diagnose GAD based on your symptoms and medical history. You will also have a physical exam. The health care provider will ask specific questions about your symptoms, including how severe they are, when they started, and  if they come and go. Your health care provider may ask you about your use of alcohol or drugs, including prescription medicines. Your health care provider may refer you to a mental health specialist for further evaluation. Your health care provider will do a thorough examination and may perform additional tests to rule out other possible causes of your symptoms. To be diagnosed with GAD, a person must have anxiety that:  Is out of his or her control.  Affects several different aspects of his or her life, such as work and relationships.  Causes distress that makes him or her unable to take part in normal activities.  Includes at least three physical symptoms of GAD, such as restlessness, fatigue, trouble concentrating, irritability, muscle tension, or sleep problems.  Before your health care provider can confirm a diagnosis of GAD, these symptoms must be present more days than they are not, and they must last for six months or longer. How is this treated? The following therapies are usually used to treat GAD:  Medicine. Antidepressant medicine is usually prescribed for long-term daily control. Antianxiety medicines may be added in severe cases, especially when panic attacks occur.  Talk therapy (psychotherapy). Certain types of talk therapy can be helpful in treating GAD by providing support, education, and guidance. Options include: ? Cognitive behavioral therapy (CBT). People learn coping skills and techniques to ease their anxiety. They learn to identify unrealistic or negative thoughts and behaviors and to replace them with positive ones. ? Acceptance and commitment therapy (ACT). This treatment teaches people how to be mindful as a way to cope with unwanted thoughts and feelings. ?  Biofeedback. This process trains you to manage your body's response (physiological response) through breathing techniques and relaxation methods. You will work with a therapist while machines are used to monitor  your physical symptoms.  Stress management techniques. These include yoga, meditation, and exercise.  A mental health specialist can help determine which treatment is best for you. Some people see improvement with one type of therapy. However, other people require a combination of therapies. Follow these instructions at home:  Take over-the-counter and prescription medicines only as told by your health care provider.  Try to maintain a normal routine.  Try to anticipate stressful situations and allow extra time to manage them.  Practice any stress management or self-calming techniques as taught by your health care provider.  Do not punish yourself for setbacks or for not making progress.  Try to recognize your accomplishments, even if they are small.  Keep all follow-up visits as told by your health care provider. This is important. Contact a health care provider if:  Your symptoms do not get better.  Your symptoms get worse.  You have signs of depression, such as: ? A persistently sad, cranky, or irritable mood. ? Loss of enjoyment in activities that used to bring you joy. ? Change in weight or eating. ? Changes in sleeping habits. ? Avoiding friends or family members. ? Loss of energy for normal tasks. ? Feelings of guilt or worthlessness. Get help right away if:  You have serious thoughts about hurting yourself or others. If you ever feel like you may hurt yourself or others, or have thoughts about taking your own life, get help right away. You can go to your nearest emergency department or call:  Your local emergency services (911 in the [place]  A suicide crisis helpline, such as the [facility] at (775) 503-2021. This is open 24 hours a day.  Summary  Generalized anxiety disorder (GAD) is a mental health disorder that involves worry that is not triggered by a specific event.  People with GAD often worry excessively about many things in  their lives, such as their health and family.  GAD may cause physical symptoms such as restlessness, trouble concentrating, sleep problems, frequent sweating, nausea, diarrhea, headaches, and trembling or muscle twitching.  A mental health specialist can help determine which treatment is best for you. Some people see improvement with one type of therapy. However, other people require a combination of therapies. This information is not intended to replace advice given to you by your health care provider. Make sure you discuss any questions you have with your health care provider. Document Released: 03/02/2013 Document Revised: 09/25/2016 Document Reviewed: 09/25/2016 Elsevier Interactive Patient Education  [employer].

## 2017-05-24 DIAGNOSIS — M5136 Other intervertebral disc degeneration, lumbar region: Secondary | ICD-10-CM | POA: Diagnosis not present

## 2017-05-24 DIAGNOSIS — M791 Myalgia: Secondary | ICD-10-CM | POA: Diagnosis not present

## 2017-05-25 ENCOUNTER — Other Ambulatory Visit: Payer: Self-pay | Admitting: Internal Medicine

## 2017-06-04 ENCOUNTER — Ambulatory Visit: Payer: Medicare Other | Admitting: Nurse Practitioner

## 2017-07-04 ENCOUNTER — Other Ambulatory Visit: Payer: Self-pay | Admitting: Family

## 2017-07-05 ENCOUNTER — Encounter: Payer: Self-pay | Admitting: Nurse Practitioner

## 2017-07-05 ENCOUNTER — Ambulatory Visit (INDEPENDENT_AMBULATORY_CARE_PROVIDER_SITE_OTHER): Payer: Medicare Other | Admitting: Nurse Practitioner

## 2017-07-05 ENCOUNTER — Other Ambulatory Visit (INDEPENDENT_AMBULATORY_CARE_PROVIDER_SITE_OTHER): Payer: Medicare Other

## 2017-07-05 VITALS — BP 120/78 | HR 80 | Temp 98.8°F | Ht 59.0 in | Wt 176.0 lb

## 2017-07-05 DIAGNOSIS — N920 Excessive and frequent menstruation with regular cycle: Secondary | ICD-10-CM

## 2017-07-05 DIAGNOSIS — K5900 Constipation, unspecified: Secondary | ICD-10-CM | POA: Diagnosis not present

## 2017-07-05 DIAGNOSIS — R1013 Epigastric pain: Secondary | ICD-10-CM | POA: Diagnosis not present

## 2017-07-05 DIAGNOSIS — R1084 Generalized abdominal pain: Secondary | ICD-10-CM | POA: Diagnosis not present

## 2017-07-05 DIAGNOSIS — R5383 Other fatigue: Secondary | ICD-10-CM

## 2017-07-05 DIAGNOSIS — F5089 Other specified eating disorder: Secondary | ICD-10-CM | POA: Diagnosis not present

## 2017-07-05 DIAGNOSIS — R140 Abdominal distension (gaseous): Secondary | ICD-10-CM | POA: Diagnosis not present

## 2017-07-05 DIAGNOSIS — F418 Other specified anxiety disorders: Secondary | ICD-10-CM | POA: Diagnosis not present

## 2017-07-05 DIAGNOSIS — F5083 Pica in adults: Secondary | ICD-10-CM

## 2017-07-05 LAB — CBC WITH DIFFERENTIAL/PLATELET
Basophils Absolute: 0.1 10*3/uL (ref 0.0–0.1)
Basophils Relative: 1.5 % (ref 0.0–3.0)
Eosinophils Absolute: 0.2 10*3/uL (ref 0.0–0.7)
Eosinophils Relative: 2.3 % (ref 0.0–5.0)
HCT: 37.2 % (ref 36.0–46.0)
Hemoglobin: 12.1 g/dL (ref 12.0–15.0)
Lymphocytes Relative: 30.7 % (ref 12.0–46.0)
Lymphs Abs: 2.5 10*3/uL (ref 0.7–4.0)
MCHC: 32.5 g/dL (ref 30.0–36.0)
MCV: 85.7 fl (ref 78.0–100.0)
Monocytes Absolute: 0.9 10*3/uL (ref 0.1–1.0)
Monocytes Relative: 11.2 % (ref 3.0–12.0)
Neutro Abs: 4.4 10*3/uL (ref 1.4–7.7)
Neutrophils Relative %: 54.3 % (ref 43.0–77.0)
Platelets: 296 10*3/uL (ref 150.0–400.0)
RBC: 4.35 Mil/uL (ref 3.87–5.11)
RDW: 14.4 % (ref 11.5–15.5)
WBC: 8.2 10*3/uL (ref 4.0–10.5)

## 2017-07-05 LAB — FERRITIN: Ferritin: 5.3 ng/mL — ABNORMAL LOW (ref 10.0–291.0)

## 2017-07-05 LAB — COMPREHENSIVE METABOLIC PANEL
ALT: 10 U/L (ref 0–35)
AST: 13 U/L (ref 0–37)
Albumin: 3.9 g/dL (ref 3.5–5.2)
Alkaline Phosphatase: 83 U/L (ref 39–117)
BUN: 6 mg/dL (ref 6–23)
CO2: 28 mEq/L (ref 19–32)
Calcium: 9.3 mg/dL (ref 8.4–10.5)
Chloride: 105 mEq/L (ref 96–112)
Creatinine, Ser: 0.69 mg/dL (ref 0.40–1.20)
GFR: 117.71 mL/min (ref >60.00)
Glucose, Bld: 102 mg/dL — ABNORMAL HIGH (ref 70–99)
Potassium: 3.7 mEq/L (ref 3.5–5.1)
Sodium: 139 mEq/L (ref 135–145)
Total Bilirubin: 0.5 mg/dL (ref 0.2–1.2)
Total Protein: 6.9 g/dL (ref 6.0–8.3)

## 2017-07-05 LAB — IRON AND TIBC
%SAT: 10 % — ABNORMAL LOW (ref 11–50)
Iron: 37 ug/dL — ABNORMAL LOW (ref 40–190)
TIBC: 380 ug/dL (ref 250–450)
UIBC: 343 ug/dL

## 2017-07-05 LAB — H. PYLORI ANTIBODY, IGG: H Pylori IgG: NEGATIVE

## 2017-07-05 MED ORDER — ESCITALOPRAM OXALATE 20 MG PO TABS: 20.0000 mg | ORAL_TABLET | Freq: Every day | ORAL | 3 refills | Status: DC

## 2017-07-05 MED ORDER — RANITIDINE HCL 150 MG PO TABS: 150.0000 mg | ORAL_TABLET | Freq: Two times a day (BID) | ORAL | 3 refills | Status: DC

## 2017-07-05 MED ORDER — BUSPIRONE HCL 7.5 MG PO TABS: 7.5000 mg | ORAL_TABLET | Freq: Two times a day (BID) | ORAL | 3 refills | Status: DC

## 2017-07-05 NOTE — Patient Instructions (Signed)
Encourage adequate oral hydration. Change diet as directed below. You may also start florastor or curturelle (probiotic) 1cap twice a day.  Due to heavy menstrual bleed, start ferrous sulfate and multivitamin once a day, start 1week prior to cycle. Take medication for 2weeks a month.  Follow up with GYN about uterine fibroids.  Diet for Irritable Bowel Syndrome When you have irritable bowel syndrome (IBS), the foods you eat and your eating habits are very important. IBS may cause various symptoms, such as abdominal pain, constipation, or diarrhea. Choosing the right foods can help ease discomfort caused by these symptoms. Work with your health care provider and dietitian to find the best eating plan to help control your symptoms. What general guidelines do I need to follow?  Keep a food diary. This will help you identify foods that cause symptoms. Write down: ? What you eat and when. ? What symptoms you have. ? When symptoms occur in relation to your meals.  Avoid foods that cause symptoms. Talk with your dietitian about other ways to get the same nutrients that are in these foods.  Eat more foods that contain fiber. Take a fiber supplement if directed by your dietitian.  Eat your meals slowly, in a relaxed setting.  Aim to eat 5-6 small meals per day. Do not skip meals.  Drink enough fluids to keep your urine clear or pale yellow.  Ask your health care provider if you should take an over-the-counter probiotic during flare-ups to help restore healthy gut bacteria.  If you have cramping or diarrhea, try making your meals low in fat and high in carbohydrates. Examples of carbohydrates are pasta, rice, whole grain breads and cereals, fruits, and vegetables.  If dairy products cause your symptoms to flare up, try eating less of them. You might be able to handle yogurt better than other dairy products because it contains bacteria that help with digestion. What foods are not  recommended? The following are some foods and drinks that may worsen your symptoms:  Fatty foods, such as [place] fries.  Milk products, such as cheese or ice cream.  Chocolate.  Alcohol.  Products with caffeine, such as coffee.  Carbonated drinks, such as soda.  The items listed above may not be a complete list of foods and beverages to avoid. Contact your dietitian for more information. What foods are good sources of fiber? Your health care provider or dietitian may recommend that you eat more foods that contain fiber. Fiber can help reduce constipation and other IBS symptoms. Add foods with fiber to your diet a little at a time so that your body can get used to them. Too much fiber at once might cause gas and swelling of your abdomen. The following are some foods that are good sources of fiber:  Apples.  Peaches.  Pears.  Berries.  Figs.  Broccoli (raw).  Cabbage.  Carrots.  Raw peas.  Kidney beans.  Lima beans.  Whole grain bread.  Whole grain cereal.  Where to find more information: [employer] for Functional Gastrointestinal Disorders: www.iffgd.[employer] of Diabetes and Digestive and Kidney Diseases: NetworkAff[employer].co.za.aspx This information is not intended to replace advice given to you by your health care provider. Make sure you discuss any questions you have with your health care provider. Document Released: 01/26/2004 Document Revised: 04/12/2016 Document Reviewed: 02/05/2014 Elsevier Interactive Patient Education  2018 [employer].  Irritable Bowel Syndrome, Adult Irritable bowel syndrome (IBS) is not one specific disease. It is a group of symptoms  that affects the organs responsible for digestion (gastrointestinal or GI tract). To regulate how your GI tract works, your body sends signals back and forth between your intestines and your brain. If you have IBS,  there may be a problem with these signals. As a result, your GI tract does not function normally. Your intestines may become more sensitive and overreact to certain things. This is especially true when you eat certain foods or when you are under stress. There are four types of IBS. These may be determined based on the consistency of your stool:  IBS with diarrhea.  IBS with constipation.  Mixed IBS.  Unsubtyped IBS.  It is important to know which type of IBS you have. Some treatments are more likely to be helpful for certain types of IBS. What are the causes? The exact cause of IBS is not known. What increases the risk? You may have a higher risk of IBS if:  You are a woman.  You are younger than 46 years old.  You have a family history of IBS.  You have mental health problems.  You have had bacterial infection of your GI tract.  What are the signs or symptoms? Symptoms of IBS vary from person to person. The main symptom is abdominal pain or discomfort. Additional symptoms usually include one or more of the following:  Diarrhea, constipation, or both.  Abdominal swelling or bloating.  Feeling full or sick after eating a small or regular-size meal.  Frequent gas.  Mucus in the stool.  A feeling of having more stool left after a bowel movement.  Symptoms tend to come and go. They may be associated with stress, psychiatric conditions, or nothing at all. How is this diagnosed? There is no specific test to diagnose IBS. Your health care provider will make a diagnosis based on a physical exam, medical history, and your symptoms. You may have other tests to rule out other conditions that may be causing your symptoms. These may include:  Blood tests.  X-rays.  CT scan.  Endoscopy and colonoscopy. This is a test in which your GI tract is viewed with a long, thin, flexible tube.  How is this treated? There is no cure for IBS, but treatment can help relieve symptoms. IBS  treatment often includes:  Changes to your diet, such as: ? Eating more fiber. ? Avoiding foods that cause symptoms. ? Drinking more water. ? Eating regular, medium-sized portioned meals.  Medicines. These may include: ? Fiber supplements if you have constipation. ? Medicine to control diarrhea (antidiarrheal medicines). ? Medicine to help control muscle spasms in your GI tract (antispasmodic medicines). ? Medicines to help with any mental health issues, such as antidepressants or tranquilizers.  Therapy. ? Talk therapy may help with anxiety, depression, or other mental health issues that can make IBS symptoms worse.  Stress reduction. ? Managing your stress can help keep symptoms under control.  Follow these instructions at home:  Take medicines only as directed by your health care provider.  Eat a healthy diet. ? Avoid foods and drinks with added sugar. ? Include more whole grains, fruits, and vegetables gradually into your diet. This may be especially helpful if you have IBS with constipation. ? Avoid any foods and drinks that make your symptoms worse. These may include dairy products and caffeinated or carbonated drinks. ? Do not eat large meals. ? Drink enough fluid to keep your urine clear or pale yellow.  Exercise regularly. Ask your health care provider for  recommendations of good activities for you.  Keep all follow-up visits as directed by your health care provider. This is important. Contact a health care provider if:  You have constant pain.  You have trouble or pain with swallowing.  You have worsening diarrhea. Get help right away if:  You have severe and worsening abdominal pain.  You have diarrhea and: ? You have a rash, stiff neck, or severe headache. ? You are irritable, sleepy, or difficult to awaken. ? You are weak, dizzy, or extremely thirsty.  You have bright red blood in your stool or you have black tarry stools.  You have unusual abdominal  swelling that is painful.  You vomit continuously.  You vomit blood (hematemesis).  You have both abdominal pain and a fever. This information is not intended to replace advice given to you by your health care provider. Make sure you discuss any questions you have with your health care provider. Document Released: 11/05/2005 Document Revised: 04/06/2016 Document Reviewed: 07/23/2014 Elsevier Interactive Patient Education  2018 [employer].

## 2017-07-05 NOTE — Progress Notes (Signed)
Subjective:  [name], female    DOB: Mar 06, 1971  Age: 46 y.o. MRN: [id number]  CC: Follow-up (4 wk follow up--pain on lower abdominal going on for while--getting worse)   HPI  Depression and anxiety: Stable with lexapro and buspar.  ABD pain: Chronic, intermittent, describes as cramps and bloating. Unknown trigger. Hx of Chronic constipation. Some improvement with OTC laxatives and stool softerner S/p cholecystectomy. Some improvement with diet changes FHx of colon polyps and diverticulosis. And IBS. Neg FHX of colon cancer or uterine or ovarian cancer Hx of uterine fibroids and GERD.  fatigue Reports worsening fatigue and craving Ice: Hx of menorrhagia due to uterine fibroids. Has not seen GYN for over 1year. Hx of iron deficiency but does not take any supplement.  Outpatient Medications Prior to Visit  Medication Sig Dispense Refill  . albuterol (PROVENTIL) (2.5 MG/3ML) 0.083% nebulizer solution USE ONE VIAL IN NEBULIZER EVERY 6 HOURS AS NEEDED FOR WHEEZING OR SHORTNESS OF BREATH 150 mL 1  . cyclobenzaprine (FLEXERIL) 10 MG tablet Take 10 mg by mouth 3 (three) times daily as needed for muscle spasms.    . diclofenac sodium (VOLTAREN) 1 % GEL Apply 2 g topically 3 (three) times daily as needed (for pain).    Marland Kitchen DILT-XR 120 MG 24 hr capsule TAKE ONE CAPSULE BY MOUTH ONCE DAILY 30 capsule 3  . EQ LORATADINE 10 MG tablet TAKE ONE TABLET BY MOUTH ONCE DAILY 30 tablet 11  . EQ STOOL SOFTENER/LAXATIVE 8.6-50 MG tablet TAKE ONE TABLET BY MOUTH ONCE DAILY 30 tablet 0  . EQ STOOL SOFTENER/LAXATIVE 8.6-50 MG tablet TAKE ONE TABLET BY MOUTH ONCE DAILY 30 tablet 11  . fluticasone (FLONASE) 50 MCG/ACT nasal spray USE TWO SPRAY(S) IN EACH NOSTRIL ONCE DAILY 16 g 3  . gabapentin (NEURONTIN) 300 MG capsule Take 300 mg by mouth 2 (two) times daily.    . metaxalone (SKELAXIN) 800 MG tablet Take 800 mg by mouth 3 (three) times daily.    . Oxycodone HCl 10 MG TABS Take 1  tablet by mouth every 8 (eight) hours as needed.    Marland Kitchen PROAIR HFA 108 (90 Base) MCG/ACT inhaler INHALE TWO PUFFS BY MOUTH EVERY 6 HOURS AS NEEDED FOR WHEEZING OR SHORTNESS OF BREATH 18 each 5  . PROCTO-MED HC 2.5 % rectal cream APPLY CREAM TOPICALLY RECTALLY 2 TIMES DAILY 30 g 1  . promethazine (PHENERGAN) 12.5 MG tablet Take 12.5 mg by mouth every 6 (six) hours as needed for nausea or vomiting.    . SENNA-S 8.6-50 MG tablet TAKE ONE TABLET BY MOUTH ONCE DAILY 30 tablet 0  . tiZANidine (ZANAFLEX) 4 MG tablet Take 4 mg by mouth 3 (three) times daily.    . busPIRone (BUSPAR) 7.5 MG tablet Take 1 tablet (7.5 mg total) by mouth 2 (two) times daily. 60 tablet 3  . escitalopram (LEXAPRO) 20 MG tablet Take 1 tablet (20 mg total) by mouth daily. 30 tablet 2  . losartan (COZAAR) 50 MG tablet TAKE 1 TABLET BY MOUTH ONCE DAILY 90 tablet 0  . ranitidine (ZANTAC) 150 MG tablet Take 150 mg by mouth 2 (two) times daily.     No facility-administered medications prior to visit.     ROS Review of Systems  Constitutional: Positive for malaise/fatigue. Negative for fever and weight loss.  HENT: Negative for congestion and sore throat.   Respiratory: Negative for cough and shortness of breath.   Cardiovascular: Negative for chest pain and palpitations.  Gastrointestinal: Positive for abdominal pain, constipation and heartburn. Negative for blood in stool, diarrhea, melena, nausea and vomiting.  Neurological: Negative for weakness.  Psychiatric/Behavioral: Negative for memory loss, substance abuse and suicidal ideas. The patient does not have insomnia.      Objective:  BP 120/78   Pulse 80   Temp 98.8 F (37.1 C)   Ht 4\' 11"  (1.499 m)   Wt 176 lb (79.8 kg)   SpO2 97%   BMI 35.55 kg/m   BP Readings from Last 3 Encounters:  07/05/17 120/78  05/06/17 138/80  12/13/16 130/80    Wt Readings from Last 3 Encounters:  07/05/17 176 lb (79.8 kg)  05/06/17 174 lb (78.9 kg)  12/13/16 174 lb (78.9 kg)     Physical Exam  Constitutional: She is oriented to person, place, and time. No distress.  Neck: Normal range of motion. Neck supple. No thyromegaly present.  Cardiovascular: Normal rate, regular rhythm and normal heart sounds.   Pulmonary/Chest: Effort normal and breath sounds normal.  Abdominal: Soft. She exhibits distension. She exhibits no mass. There is no tenderness. There is no rebound and no guarding.  Lymphadenopathy:    She has no cervical adenopathy.  Neurological: She is alert and oriented to person, place, and time.  Skin: Skin is warm and dry. No rash noted.  Vitals reviewed.   Lab Results  Component Value Date   WBC 13.2 (H) 06/27/2016   HGB 12.3 06/27/2016   HCT 37.2 06/27/2016   PLT 328.0 06/27/2016   GLUCOSE 100 (H) 06/14/2015   CHOL 150 06/14/2015   TRIG 367.0 (H) 06/14/2015   HDL 23.20 (L) 06/14/2015   LDLDIRECT 74.0 06/14/2015   ALT 10 06/14/2015   AST 14 06/14/2015   NA 140 06/14/2015   K 3.5 06/14/2015   CL 105 06/14/2015   CREATININE 0.72 06/14/2015   BUN 7 06/14/2015   CO2 27 06/14/2015   TSH 0.79 06/27/2016   HGBA1C 5.1 06/14/2015    US Breast Aspiration Left  Result Date: 09/28/2016 CLINICAL DATA:  Left breast 2 o'clock cyst. EXAM: ULTRASOUND GUIDED LEFT BREAST CYST ASPIRATION COMPARISON:  Previous exams. PROCEDURE: Using sterile technique, 1% lidocaine, under direct ultrasound visualization, needle aspiration of left breast 2 o'clock cyst was performed. The cyst was noted to collapse after the aspiration. Benign-type fluid was aspirated and discarded. IMPRESSION: Ultrasound-guided aspiration of left breast 2 o'clock cyst No apparent complications. RECOMMENDATIONS: Screening mammogram in one year.(Code:[id number]) Electronically Signed   By: Fidela Salisbury M.D.   On: 09/28/2016 14:42    Assessment & Plan:   Emily [name] was seen today for follow-up.  Diagnoses and all orders for this visit:  Depression with anxiety -     escitalopram  (LEXAPRO) 20 MG tablet; Take 1 tablet (20 mg total) by mouth daily. -     busPIRone (BUSPAR) 7.5 MG tablet; Take 1 tablet (7.5 mg total) by mouth 2 (two) times daily.  Bloating -     Cancel: Ambulatory referral to Gastroenterology -     Ambulatory referral to Gastroenterology  Constipation, unspecified constipation type -     Cancel: Ambulatory referral to Gastroenterology -     Ambulatory referral to Gastroenterology  Generalized abdominal pain -     Cancel: Ambulatory referral to Gastroenterology  Dyspepsia -     H. pylori antibody, IgG; Future -     Cancel: Ambulatory referral to Gastroenterology -     ranitidine (ZANTAC) 150 MG tablet; Take 1 tablet (150 mg total)  by mouth 2 (two) times daily. -     Ambulatory referral to Gastroenterology  Pica in adults -     CBC w/Diff; Future -     Cancel: B12; Future -     Vitamin D 1,25 dihydroxy; Future -     Cancel: TSH; Future -     Comprehensive metabolic panel; Future -     Iron Binding Cap (TIBC); Future -     Ferritin; Future  Fatigue, unspecified type -     CBC w/Diff; Future -     Cancel: B12; Future -     Vitamin D 1,25 dihydroxy; Future -     Cancel: TSH; Future -     Comprehensive metabolic panel; Future -     Iron Binding Cap (TIBC); Future -     Ferritin; Future  Menorrhagia with regular cycle -     Iron Binding Cap (TIBC); Future -     Ferritin; Future   I have changed Emily [name]'s ranitidine. I am also having her maintain her gabapentin, diclofenac sodium, metaxalone, tiZANidine, SENNA-S, EQ STOOL SOFTENER/LAXATIVE, Oxycodone HCl, PROAIR HFA, cyclobenzaprine, promethazine, EQ LORATADINE, DILT-XR, EQ STOOL SOFTENER/LAXATIVE, fluticasone, PROCTO-MED HC, albuterol, escitalopram, and busPIRone.  Meds ordered this encounter  Medications  . ranitidine (ZANTAC) 150 MG tablet    Sig: Take 1 tablet (150 mg total) by mouth 2 (two) times daily.    Dispense:  60 tablet    Refill:  3    Order Specific Question:    Supervising Provider    Answer:   Cassandria Anger [[id number]]  . escitalopram (LEXAPRO) 20 MG tablet    Sig: Take 1 tablet (20 mg total) by mouth daily.    Dispense:  90 tablet    Refill:  3    Order Specific Question:   Supervising Provider    Answer:   Cassandria Anger [[id number]]  . busPIRone (BUSPAR) 7.5 MG tablet    Sig: Take 1 tablet (7.5 mg total) by mouth 2 (two) times daily.    Dispense:  180 tablet    Refill:  3    Please consider 90 day supplies to promote better adherence    Order Specific Question:   Supervising Provider    Answer:   Cassandria Anger [[id number]]    Follow-up: Return in about 6 months (around 01/05/2018) for with Dr. [name].  Wilfred Lacy, NP

## 2017-07-08 LAB — VITAMIN D 1,25 DIHYDROXY
Vitamin D 1, 25 (OH)2 Total: 39 pg/mL (ref 18–72)
Vitamin D2 1, 25 (OH)2: <8 pg/mL
Vitamin D3 1, 25 (OH)2: 39 pg/mL

## 2017-07-15 ENCOUNTER — Encounter: Payer: Self-pay | Admitting: Nurse Practitioner

## 2017-07-18 DIAGNOSIS — M47816 Spondylosis without myelopathy or radiculopathy, lumbar region: Secondary | ICD-10-CM | POA: Diagnosis not present

## 2017-07-18 DIAGNOSIS — M791 Myalgia: Secondary | ICD-10-CM | POA: Diagnosis not present

## 2017-07-18 DIAGNOSIS — M5136 Other intervertebral disc degeneration, lumbar region: Secondary | ICD-10-CM | POA: Diagnosis not present

## 2017-07-18 DIAGNOSIS — M5417 Radiculopathy, lumbosacral region: Secondary | ICD-10-CM | POA: Diagnosis not present

## 2017-07-30 ENCOUNTER — Encounter: Payer: Self-pay | Admitting: Nurse Practitioner

## 2017-07-30 ENCOUNTER — Encounter (INDEPENDENT_AMBULATORY_CARE_PROVIDER_SITE_OTHER): Payer: Self-pay

## 2017-07-30 ENCOUNTER — Ambulatory Visit (INDEPENDENT_AMBULATORY_CARE_PROVIDER_SITE_OTHER): Payer: Medicare Other | Admitting: Nurse Practitioner

## 2017-07-30 VITALS — BP 110/74 | HR 78 | Ht 59.0 in | Wt 172.0 lb

## 2017-07-30 DIAGNOSIS — R140 Abdominal distension (gaseous): Secondary | ICD-10-CM | POA: Diagnosis not present

## 2017-07-30 DIAGNOSIS — K649 Unspecified hemorrhoids: Secondary | ICD-10-CM | POA: Diagnosis not present

## 2017-07-30 DIAGNOSIS — D509 Iron deficiency anemia, unspecified: Secondary | ICD-10-CM | POA: Diagnosis not present

## 2017-07-30 DIAGNOSIS — K625 Hemorrhage of anus and rectum: Secondary | ICD-10-CM | POA: Diagnosis not present

## 2017-07-30 MED ORDER — NA SULFATE-K SULFATE-MG SULF 17.5-3.13-1.6 GM/177ML PO SOLN: ORAL | 0 refills | Status: DC

## 2017-07-30 NOTE — Patient Instructions (Signed)
Your provider has requested that you have an abdominal x ray when you have bloating again. Please go to the basement floor to our Radiology department for the test.  You have been scheduled for a colonoscopy. Please follow written instructions given to you at your visit today.  Please pick up your prep supplies at the pharmacy within the next 1-3 days. If you use inhalers (even only as needed), please bring them with you on the day of your procedure. Your physician has requested that you go to www.[email or web address].[email or web address] and enter the access code given to you at your visit today. This web site gives a general overview about your procedure. However, you should still follow specific instructions given to you by our office regarding your preparation for the procedure. You may have a light breakfast the morning of prep day (the day before the procedure).   You may choose from one of the following items: eggs and toast OR chicken noodle soup and crackers.   You should have your breakfast completed between 8:00 and 9:00 am the day before your procedure.    After you have had your light breakfast you should start a clear liquid diet only, NO SOLIDS. No additional solid food is allowed. You may continue to have clear liquid up to 3 hours prior to your procedure.   HOLD IRON ONE WEEK PRIOR TO COLONOSCOPY.  If you are age 55 or older, your body mass index should be between 23-30. Your Body mass index is 34.74 kg/m. If this is out of the aforementioned range listed, please consider follow up with your Primary Care Provider.  If you are age 33 or younger, your body mass index should be between 19-25. Your Body mass index is 34.74 kg/m. If this is out of the aformentioned range listed, please consider follow up with your Primary Care Provider.   Thank you for choosing me and [facility].   Tye Savoy, NP

## 2017-07-30 NOTE — Progress Notes (Signed)
HPI: Patient is a 46 year old female, new to this practice, here for evaluation of bloating and hemorrhoid problems. Over the last 2 years patient has had intermittent lower abdominal discomfort associated with bloating/distention. Evaluated by PCP in August for upper discomfort. H.pylori antibody negative. She describes more of a generalized, predominantly lower discomfort to me. She has a history of chronic constipation, takes stool softeners and says she rarely gets constipated now. Denies straining.  When symptoms occur she looks pregnant. The distention is not related to eating, she can wake up in the morning with a distended abdomen. She has noticed that the bloating and distention are worse when on her menstrual cycle. Patient has heavy bleeding with cycles. She had ablation of fibroids in 2015 but it did not help with the heavy bleeding.  Her last GYN exam was 2 years ago, she has one in the near future. Patient has been told she may come to a hysterectomy at some point. The intermittent abdominal distention is not associated with nausea or vomiting. She doesn't have any significant abdominal pain but rather "pressure" in abdomen when distended..  The distention usually last a day during which time she takes Mylanta, Zantac, Pepto-Bismol and reduces her by mouth intake. She has occasional rectal bleeding attributed to hemorrhoids. She is really bothered by hemorrhoids. She frequent has to push the hemorrhoids back up into anal canal. She has used external hemorrhoidal cream and [employer] hemorrhoid suppositories everyday for a year.   Labs three weeks ago.  Serum chemistries unremarkable, H. pylori negative. Hemoglobin 12.1. Ferritin 5.3.  Patient has chronic pain. She is on Neurontin and muscle relaxers.    Past Medical History:  Diagnosis Date  . Allergic rhinitis   . Anal fissure   . Anemia   . Anxiety   . Arthritis   . Asthma    uses inhaler as needed  . Chronic neck pain    pain mgmt in [place]  . Depression   . Gallbladder disease   . GERD (gastroesophageal reflux disease)   . Headache(784.0)   . HTN (hypertension)    BP has been normal recently per pt and not taking meds  . Hyperthyroidism   . Obesity   . Thyroid nodule    incidental on MRI neck 2014, s/p endo eval  . Uterine fibroid      Past Surgical History:  Procedure Laterality Date  . CERVICAL SPINE SURGERY     2 disc removed  . [place] Malformation  2003  . CHOLECYSTECTOMY  1993  . DILITATION & CURRETTAGE/HYSTROSCOPY WITH NOVASURE ABLATION N/A 03/15/2014   Procedure: DILATATION & CURETTAGE/HYSTEROSCOPY WITH NOVASURE ABLATION;  Surgeon: Lovenia Kim, MD;  Location: [facility];  Service: Gynecology;  Laterality: N/A;  . Laberal tear  2001   Right Shoulder  . 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 WISDOM TOOTH EXTRACTION  1992   Family History  Problem Relation Age of Onset  . Breast cancer Mother        over 80 when dx  . Diverticulitis Mother   . Arthritis Mother   . Hyperlipidemia Mother   . Hypertension Mother   . Pancreatic cancer Paternal Grandmother   . Breast cancer Paternal Aunt        x 2 pat aunts  . Irritable bowel syndrome Daughter   . Hypotension Daughter   . Alcohol abuse Son   . Alcohol abuse Paternal Uncle   .  Alcohol abuse Maternal Uncle        x several   Social History  Substance Use Topics  . Smoking status: Current Every Day Smoker    Packs/day: 1.00    Years: 18.00    Types: Cigarettes  . Smokeless tobacco: Never Used     Comment: Tobacco info given 07/30/17  . Alcohol use No   Current Outpatient Prescriptions  Medication Sig Dispense Refill  . albuterol (PROVENTIL) (2.5 MG/3ML) 0.083% nebulizer solution USE ONE VIAL IN NEBULIZER EVERY 6 HOURS AS NEEDED FOR WHEEZING OR SHORTNESS OF BREATH 150 mL 1  . busPIRone (BUSPAR) 7.5 MG tablet Take 1 tablet (7.5 mg total) by mouth 2 (two) times daily. 180 tablet 3  . cyclobenzaprine  (FLEXERIL) 10 MG tablet Take 10 mg by mouth 3 (three) times daily as needed for muscle spasms.    . diclofenac sodium (VOLTAREN) 1 % GEL Apply 2 g topically 3 (three) times daily as needed (for pain).    Marland Kitchen DILT-XR 120 MG 24 hr capsule TAKE ONE CAPSULE BY MOUTH ONCE DAILY 30 capsule 3  . EQ LORATADINE 10 MG tablet TAKE ONE TABLET BY MOUTH ONCE DAILY 30 tablet 11  . EQ STOOL SOFTENER/LAXATIVE 8.6-50 MG tablet TAKE ONE TABLET BY MOUTH ONCE DAILY 30 tablet 0  . EQ STOOL SOFTENER/LAXATIVE 8.6-50 MG tablet TAKE ONE TABLET BY MOUTH ONCE DAILY 30 tablet 11  . escitalopram (LEXAPRO) 20 MG tablet Take 1 tablet (20 mg total) by mouth daily. 90 tablet 3  . fluticasone (FLONASE) 50 MCG/ACT nasal spray USE TWO SPRAY(S) IN EACH NOSTRIL ONCE DAILY 16 g 3  . gabapentin (NEURONTIN) 300 MG capsule Take 300 mg by mouth 2 (two) times daily.    Marland Kitchen losartan (COZAAR) 50 MG tablet TAKE 1 TABLET BY MOUTH ONCE DAILY 90 tablet 0  . metaxalone (SKELAXIN) 800 MG tablet Take 800 mg by mouth 3 (three) times daily.    . Oxycodone HCl 10 MG TABS Take 1 tablet by mouth every 8 (eight) hours as needed.    Marland Kitchen PROAIR HFA 108 (90 Base) MCG/ACT inhaler INHALE TWO PUFFS BY MOUTH EVERY 6 HOURS AS NEEDED FOR WHEEZING OR SHORTNESS OF BREATH 18 each 5  . PROCTO-MED HC 2.5 % rectal cream APPLY CREAM TOPICALLY RECTALLY 2 TIMES DAILY 30 g 1  . promethazine (PHENERGAN) 12.5 MG tablet Take 12.5 mg by mouth every 6 (six) hours as needed for nausea or vomiting.    . ranitidine (ZANTAC) 150 MG tablet Take 1 tablet (150 mg total) by mouth 2 (two) times daily. 60 tablet 3  . SENNA-S 8.6-50 MG tablet TAKE ONE TABLET BY MOUTH ONCE DAILY 30 tablet 0  . tiZANidine (ZANAFLEX) 4 MG tablet Take 4 mg by mouth 3 (three) times daily.     No current facility-administered medications for this visit.    Allergies  Allergen Reactions  . Morphine And Related     Lowers BP too much     Review of Systems: Positive for allergy, sinus trouble, anxiety,  arthritis, back pain, vision changes, depression, fatigue, menstrual pain, muscle pains and cramps. All other systems reviewed and negative except where noted in HPI.    Physical Exam: BP 110/74   Pulse 78   Ht 4\' 11"  (1.499 m)   Wt 172 lb (78 kg)   BMI 34.74 kg/m  Constitutional:  Overweight black female in no acute distress. Psychiatric: Normal mood and affect. Behavior is normal. EENT: Pupils normal.  Conjunctivae are normal.  No scleral icterus. Neck supple.  Cardiovascular: Normal rate, regular rhythm. No edema Pulmonary/chest: Effort normal and breath sounds normal. No wheezing, rales or rhonchi. Abdominal: Soft, nondistended. Nontender. Bowel sounds active throughout. There are no masses palpable. No hepatomegaly. Lymphadenopathy: No cervical adenopathy noted. Neurological: Alert and oriented to person place and time. Skin: Skin is warm and dry. No rashes noted.   ASSESSMENT AND PLAN:  66. 47 year old female with CHRONIC intermittent abdominal bloating/distention for 2 years but, progressive over last few months.  Symptoms unrelated to eating, worse when on menstrual cycle. She has no associated nausea or vomiting suggesting obstructive process. Her only abdominal discomfort is that of "pressure" and cramping associated with the bloating / distention. In between episodes she feels okay. She has chronic constipation (medications probably contributing) but says stool softeners / senokot keeping her regular. She isn't bloated/distended today. Abdominal exam unremarkable.  -abdominal exam is unremarkable.  -Will place an order for flat and upright x-ray of the abdomen to keep on file. When patient has another episode of this distention she will come for the x-ray. If increased stool in colon on films then she will need a more aggressive bowel regimen.  -follow through with upcominp GYN visit.  2. Hemorrhoidal discomfort / occasional bleeding. Describes frequent swelling of external  hemorrhoids and protrusion of internal hemorrhoids requring reduction. Occasional bleeding which she feels is hemorrhoidal.  -Bleeding may be hemorrhoidal but need to exclude other etiologies. For further evaluation patient will be scheduled for colonoscopy.  The risks and benefits of the procedure were discussed and the patient agrees to proceed.   3. Iron deficiency, hgb okay around 12.  She has heavy menstrual cycles. Hx of fibroids. To see GYN soon    Tye Savoy, NP  07/30/2017, 10:28 AM  Cc: Flossie Buffy, NP

## 2017-08-05 NOTE — Progress Notes (Signed)
Reviewed and agree with initial management plan.  Keirah Konitzer T. Montague Corella, MD FACG 

## 2017-08-07 ENCOUNTER — Ambulatory Visit (AMBULATORY_SURGERY_CENTER): Payer: Medicare Other | Admitting: Gastroenterology

## 2017-08-07 ENCOUNTER — Encounter: Payer: Self-pay | Admitting: Gastroenterology

## 2017-08-07 VITALS — BP 139/83 | HR 76 | Temp 98.2°F | Resp 15 | Ht 59.0 in | Wt 172.0 lb

## 2017-08-07 DIAGNOSIS — K635 Polyp of colon: Secondary | ICD-10-CM | POA: Diagnosis not present

## 2017-08-07 DIAGNOSIS — K625 Hemorrhage of anus and rectum: Secondary | ICD-10-CM | POA: Diagnosis not present

## 2017-08-07 DIAGNOSIS — D123 Benign neoplasm of transverse colon: Secondary | ICD-10-CM | POA: Diagnosis not present

## 2017-08-07 DIAGNOSIS — D125 Benign neoplasm of sigmoid colon: Secondary | ICD-10-CM | POA: Diagnosis not present

## 2017-08-07 DIAGNOSIS — K921 Melena: Secondary | ICD-10-CM

## 2017-08-07 DIAGNOSIS — D500 Iron deficiency anemia secondary to blood loss (chronic): Secondary | ICD-10-CM

## 2017-08-07 MED ORDER — SODIUM CHLORIDE 0.9 % IV SOLN: 500.0000 mL | INTRAVENOUS | Status: DC

## 2017-08-07 NOTE — Patient Instructions (Signed)
Impression/recommendations:  Polyps (handout given) Hemorrhoids (handout given)  YOU HAD AN ENDOSCOPIC PROCEDURE TODAY AT THE [facility]:   Refer to the procedure report that was given to you for any specific questions about what was found during the examination.  If the procedure report does not answer your questions, please call your gastroenterologist to clarify.  If you requested that your care partner not be given the details of your procedure findings, then the procedure report has been included in a sealed envelope for you to review at your convenience later.  YOU SHOULD EXPECT: Some feelings of bloating in the abdomen. Passage of more gas than usual.  Walking can help get rid of the air that was put into your GI tract during the procedure and reduce the bloating. If you had a lower endoscopy (such as a colonoscopy or flexible sigmoidoscopy) you may notice spotting of blood in your stool or on the toilet paper. If you underwent a bowel prep for your procedure, you may not have a normal bowel movement for a few days.  Please Note:  You might notice some irritation and congestion in your nose or some drainage.  This is from the oxygen used during your procedure.  There is no need for concern and it should clear up in a day or so.  SYMPTOMS TO REPORT IMMEDIATELY:   Following lower endoscopy (colonoscopy or flexible sigmoidoscopy):  Excessive amounts of blood in the stool  Significant tenderness or worsening of abdominal pains  Swelling of the abdomen that is new, acute  Fever of 100F or higher  For urgent or emergent issues, a gastroenterologist can be reached at any hour by calling [phone].  DIET:  We do recommend a small meal at first, but then you may proceed to your regular diet.  Drink plenty of fluids but you should avoid alcoholic beverages for 24 hours.  ACTIVITY:  You should plan to take it easy for the rest of today and you should NOT DRIVE or use heavy  machinery until tomorrow (because of the sedation medicines used during the test).    FOLLOW UP: Our staff will call the number listed on your records the next business day following your procedure to check on you and address any questions or concerns that you may have regarding the information given to you following your procedure. If we do not reach you, we will leave a message.  However, if you are feeling well and you are not experiencing any problems, there is no need to return our call.  We will assume that you have returned to your regular daily activities without incident.  If any biopsies were taken you will be contacted by phone or by letter within the next 1-3 weeks.  Please call us at [phone] if you have not heard about the biopsies in 3 weeks.    SIGNATURES/CONFIDENTIALITY: You and/or your care partner have signed paperwork which will be entered into your electronic medical record.  These signatures attest to the fact that that the information above on your After Visit Summary has been reviewed and is understood.  Full responsibility of the confidentiality of this discharge information lies with you and/or your care-partner.  

## 2017-08-07 NOTE — Progress Notes (Signed)
Pt's states no medical or surgical changes since previsit  

## 2017-08-07 NOTE — Progress Notes (Signed)
Called to room to assist during endoscopic procedure.  Patient ID and intended procedure confirmed with present staff. Received instructions for my participation in the procedure from the performing physician.  

## 2017-08-07 NOTE — Progress Notes (Signed)
Report given to PACU, vss 

## 2017-08-07 NOTE — Op Note (Addendum)
[facility] Patient Name: Emily [name] Procedure Date: 08/07/2017 1:29 PM MRN: [id number] Endoscopist: Ladene Artist , MD Age: 46 Referring MD:  Date of Birth: [birth year] Gender: Female Account #: [id number] Procedure:                Colonoscopy Indications:              Hematochezia, Iron deficiency anemia Medicines:                Monitored Anesthesia Care Procedure:                Pre-Anesthesia Assessment:                           - Prior to the procedure, a History and Physical                            was performed, and patient medications and                            allergies were reviewed. The patient's tolerance of                            previous anesthesia was also reviewed. The risks                            and benefits of the procedure and the sedation                            options and risks were discussed with the patient.                            All questions were answered, and informed consent                            was obtained. Prior Anticoagulants: The patient has                            taken no previous anticoagulant or antiplatelet                            agents. ASA Grade Assessment: III - A patient with                            severe systemic disease. After reviewing the risks                            and benefits, the patient was deemed in                            satisfactory condition to undergo the procedure.                           After obtaining informed consent, the colonoscope  was passed under direct vision. Throughout the                            procedure, the patient's blood pressure, pulse, and                            oxygen saturations were monitored continuously. The                            Colonoscope was introduced through the anus and                            advanced to the the cecum, identified by                            appendiceal orifice and  ileocecal valve. The                            ileocecal valve, appendiceal orifice, and rectum                            were photographed. The quality of the bowel                            preparation was good. The colonoscopy was performed                            without difficulty. The patient tolerated the                            procedure well. Scope In: 1:39:07 PM Scope Out: 1:56:03 PM Scope Withdrawal Time: 0 hours 14 minutes 12 seconds  Total Procedure Duration: 0 hours 16 minutes 56 seconds  Findings:                 The perianal and digital rectal examinations were                            normal.                           Two sessile polyps were found in the sigmoid colon                            and transverse colon. The polyps were 6 to 8 mm in                            size. These polyps were removed with a cold snare.                            Resection and retrieval were complete.                           Internal hemorrhoids were found during  retroflexion. The hemorrhoids were medium-sized and                            Grade I (internal hemorrhoids that do not prolapse).                           The exam was otherwise without abnormality on                            direct and retroflexion views. Complications:            No immediate complications. Estimated blood loss:                            None. Estimated Blood Loss:     Estimated blood loss: none. Impression:               - Two 6 to 8 mm polyps in the sigmoid colon and in                            the transverse colon, removed with a cold snare.                            Resected and retrieved.                           - Internal hemorrhoids.                           - The examination was otherwise normal on direct                            and retroflexion views. Recommendation:           - Repeat colonoscopy in 5 years for surveillance if                             polyp(s) are precancerous, otherwise 10 years for                            screening.                           - Patient has a contact number available for                            emergencies. The signs and symptoms of potential                            delayed complications were discussed with the                            patient. Return to normal activities tomorrow.                            Written discharge instructions were provided to the  patient.                           - Resume previous diet.                           - Continue present medications.                           - Await pathology results.                           - Schedule for consideration of hemorrhoidal                            banding.                           - Follow up with PCP as planned. Ladene Artist, MD 08/07/2017 2:01:15 PM This report has been signed electronically.

## 2017-08-08 ENCOUNTER — Telehealth: Payer: Self-pay

## 2017-08-08 NOTE — Telephone Encounter (Signed)
  Follow up Call-  Call back number 08/07/2017  Post procedure Call Back phone  # [phone] cell  Permission to leave phone message Yes  Some recent data might be hidden     Patient questions:  Do you have a fever, pain , or abdominal swelling? No. Pain Score  0 *  Have you tolerated food without any problems? Yes.    Have you been able to return to your normal activities? Yes.    Do you have any questions about your discharge instructions: Diet   No. Medications  No. Follow up visit  No.  Do you have questions or concerns about your Care? No.  Actions: * If pain score is 4 or above: No action needed, pain <4.

## 2017-08-28 ENCOUNTER — Other Ambulatory Visit: Payer: Self-pay | Admitting: Internal Medicine

## 2017-09-01 ENCOUNTER — Encounter: Payer: Self-pay | Admitting: Gastroenterology

## 2017-09-09 ENCOUNTER — Encounter (HOSPITAL_COMMUNITY): Payer: Self-pay | Admitting: Emergency Medicine

## 2017-09-09 ENCOUNTER — Emergency Department (HOSPITAL_COMMUNITY)
Admission: EM | Admit: 2017-09-09 | Discharge: 2017-09-09 | Disposition: A | Discharge status: Home / Self Care | Payer: Medicare Other | Attending: Emergency Medicine | Admitting: Emergency Medicine

## 2017-09-09 DIAGNOSIS — N920 Excessive and frequent menstruation with regular cycle: Secondary | ICD-10-CM | POA: Insufficient documentation

## 2017-09-09 DIAGNOSIS — I10 Essential (primary) hypertension: Secondary | ICD-10-CM | POA: Insufficient documentation

## 2017-09-09 DIAGNOSIS — J45909 Unspecified asthma, uncomplicated: Secondary | ICD-10-CM | POA: Diagnosis not present

## 2017-09-09 DIAGNOSIS — F17210 Nicotine dependence, cigarettes, uncomplicated: Secondary | ICD-10-CM | POA: Diagnosis not present

## 2017-09-09 DIAGNOSIS — R1031 Right lower quadrant pain: Secondary | ICD-10-CM | POA: Diagnosis not present

## 2017-09-09 DIAGNOSIS — Z79899 Other long term (current) drug therapy: Secondary | ICD-10-CM | POA: Diagnosis not present

## 2017-09-09 DIAGNOSIS — N390 Urinary tract infection, site not specified: Secondary | ICD-10-CM | POA: Insufficient documentation

## 2017-09-09 DIAGNOSIS — E039 Hypothyroidism, unspecified: Secondary | ICD-10-CM | POA: Diagnosis not present

## 2017-09-09 LAB — CBC
HCT: 41.6 % (ref 36.0–46.0)
Hemoglobin: 13.9 g/dL (ref 12.0–15.0)
MCH: 28.2 pg (ref 26.0–34.0)
MCHC: 33.4 g/dL (ref 30.0–36.0)
MCV: 84.4 fL (ref 78.0–100.0)
Platelets: 322 10*3/uL (ref 150–400)
RBC: 4.93 MIL/uL (ref 3.87–5.11)
RDW: 13.5 % (ref 11.5–15.5)
WBC: 14.8 10*3/uL — ABNORMAL HIGH (ref 4.0–10.5)

## 2017-09-09 LAB — URINALYSIS, ROUTINE W REFLEX MICROSCOPIC
Bilirubin Urine: NEGATIVE
Glucose, UA: NEGATIVE mg/dL
Ketones, ur: NEGATIVE mg/dL
Nitrite: NEGATIVE
Protein, ur: 100 mg/dL — AB
Specific Gravity, Urine: 1.017 (ref 1.005–1.030)
Squamous Epithelial / LPF: NONE SEEN
pH: 6 (ref 5.0–8.0)

## 2017-09-09 LAB — COMPREHENSIVE METABOLIC PANEL
ALT: 13 U/L — ABNORMAL LOW (ref 14–54)
AST: 20 U/L (ref 15–41)
Albumin: 4.5 g/dL (ref 3.5–5.0)
Alkaline Phosphatase: 113 U/L (ref 38–126)
Anion gap: 9 (ref 5–15)
BUN: <5 mg/dL — ABNORMAL LOW (ref 6–20)
CO2: 23 mmol/L (ref 22–32)
Calcium: 9.3 mg/dL (ref 8.9–10.3)
Chloride: 101 mmol/L (ref 101–111)
Creatinine, Ser: 0.77 mg/dL (ref 0.44–1.00)
GFR calc Af Amer: >60 mL/min (ref >60)
GFR calc non Af Amer: >60 mL/min (ref >60)
Glucose, Bld: 132 mg/dL — ABNORMAL HIGH (ref 65–99)
Potassium: 3.6 mmol/L (ref 3.5–5.1)
Sodium: 133 mmol/L — ABNORMAL LOW (ref 135–145)
Total Bilirubin: 1.6 mg/dL — ABNORMAL HIGH (ref 0.3–1.2)
Total Protein: 8.4 g/dL — ABNORMAL HIGH (ref 6.5–8.1)

## 2017-09-09 LAB — WET PREP, GENITAL
Clue Cells Wet Prep HPF POC: NONE SEEN
Sperm: NONE SEEN
Trich, Wet Prep: NONE SEEN
Yeast Wet Prep HPF POC: NONE SEEN

## 2017-09-09 LAB — GC/CHLAMYDIA PROBE AMP (~~LOC~~) NOT AT ARMC
Chlamydia: NEGATIVE
Neisseria Gonorrhea: NEGATIVE

## 2017-09-09 LAB — LIPASE, BLOOD: Lipase: 33 U/L (ref 11–51)

## 2017-09-09 MED ORDER — KETOROLAC TROMETHAMINE 30 MG/ML IJ SOLN
30.0000 mg | Freq: Once | INTRAMUSCULAR | Status: AC
  Administered 2017-09-09: 30 mg via INTRAVENOUS
  Filled 2017-09-09: qty 1

## 2017-09-09 MED ORDER — ONDANSETRON HCL 4 MG PO TABS: 4.0000 mg | ORAL_TABLET | Freq: Four times a day (QID) | ORAL | 0 refills | Status: DC

## 2017-09-09 MED ORDER — NAPROXEN 500 MG PO TABS: 500.0000 mg | ORAL_TABLET | Freq: Two times a day (BID) | ORAL | 0 refills | Status: DC

## 2017-09-09 MED ORDER — CEPHALEXIN 500 MG PO CAPS: 500.0000 mg | ORAL_CAPSULE | Freq: Two times a day (BID) | ORAL | 0 refills | Status: AC

## 2017-09-09 MED ORDER — ONDANSETRON HCL 4 MG/2ML IJ SOLN
4.0000 mg | Freq: Once | INTRAMUSCULAR | Status: AC
  Administered 2017-09-09: 4 mg via INTRAVENOUS
  Filled 2017-09-09: qty 2

## 2017-09-09 MED ORDER — SODIUM CHLORIDE 0.9 % IV BOLUS (SEPSIS)
500.0000 mL | Freq: Once | INTRAVENOUS | Status: AC
  Administered 2017-09-09: 500 mL via INTRAVENOUS

## 2017-09-09 NOTE — ED Provider Notes (Signed)
[facility] EMERGENCY DEPARTMENT Provider Note   CSN: [id number] Arrival date & time: 09/09/17  0448     History   Chief Complaint Chief Complaint  Patient presents with  . Abdominal Pain  . Vaginal Bleeding    HPI Emily [name] is a 46 y.o. female.  HPI  46 y.o. female with a hx of Asthma, HTN, presents to the Emergency Department today due to abdominal pain. This occurred several days ago. States pain is RLQ. Notes hx same. States pain occurs every month with menses. Notes worsening of normal pain with associated N/V x 2 last night. None this morning. No diarrhea. No CP/SOB. No fevers. No dysuria. Notes vaginal bleeding with passage of clots. States pain worse when passing clots, but then is relieved when clots pass. States menses last usually 5-7 days. Pt sexually active with one partner. Notes hx fibroids and undergoes treatment for this with OBGYN. No other symptoms noted.   Past Medical History:  Diagnosis Date  . Allergic rhinitis   . Anal fissure   . Anemia   . Anxiety   . Arthritis   . Asthma    uses inhaler as needed  . Chronic neck pain    pain mgmt in [place]  . Depression   . Gallbladder disease   . GERD (gastroesophageal reflux disease)   . Headache(784.0)   . HTN (hypertension)    BP has been normal recently per pt and not taking meds  . Hyperthyroidism   . Obesity   . Thyroid nodule    incidental on MRI neck 2014, s/p endo eval  . Uterine fibroid     Patient Active Problem List   Diagnosis Date Noted  . Anxiety state 05/06/2017  . Tooth abscess 04/03/2016  . Sweating 04/03/2016  . Iron deficiency anemia 12/16/2015  . Breast lump 09/16/2015  . Hemorrhoids 06/14/2015  . Bloating 06/14/2015  . Left thyroid nodule   . Back pain, lumbosacral   . Depression with anxiety 03/22/2010  . MIGRAINE HEADACHE 03/22/2010  . Essential hypertension 03/22/2010  . ALLERGIC RHINITIS 03/22/2010  . ASTHMA 03/22/2010  . GERD 03/22/2010  .  ARTHRITIS, SHOULDER 03/22/2010    Past Surgical History:  Procedure Laterality Date  . CERVICAL SPINE SURGERY     2 disc removed  . [place] Malformation  2003  . CHOLECYSTECTOMY  1993  . DILITATION & CURRETTAGE/HYSTROSCOPY WITH NOVASURE ABLATION N/A 03/15/2014   Procedure: DILATATION & CURETTAGE/HYSTEROSCOPY WITH NOVASURE ABLATION;  Surgeon: Lovenia Kim, MD;  Location: [facility];  Service: Gynecology;  Laterality: N/A;  . Laberal tear  2001   Right Shoulder  . 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 WISDOM TOOTH EXTRACTION  1992    OB History    No data available       Home Medications    Prior to Admission medications   Medication Sig Start Date End Date Taking? Authorizing Provider  albuterol (PROVENTIL) (2.5 MG/3ML) 0.083% nebulizer solution USE ONE VIAL IN NEBULIZER EVERY 6 HOURS AS NEEDED FOR WHEEZING OR SHORTNESS OF BREATH 04/26/17   Hoyt Koch, MD  busPIRone (BUSPAR) 7.5 MG tablet Take 1 tablet (7.5 mg total) by mouth 2 (two) times daily. 07/05/17   Nche, Charlene Brooke, NP  cyclobenzaprine (FLEXERIL) 10 MG tablet Take 10 mg by mouth 3 (three) times daily as needed for muscle spasms.    [provider]  diclofenac sodium (VOLTAREN) 1 %  GEL Apply 2 g topically 3 (three) times daily as needed (for pain).    [provider]  DILT-XR 120 MG 24 hr capsule TAKE ONE CAPSULE BY MOUTH ONCE DAILY 02/21/17   Hoyt Koch, MD  DILT-XR 120 MG 24 hr capsule TAKE ONE CAPSULE BY MOUTH ONCE DAILY 08/29/17   Hoyt Koch, MD  EQ STOOL SOFTENER/LAXATIVE 8.6-50 MG tablet TAKE ONE TABLET BY MOUTH ONCE DAILY 01/03/16   Hoyt Koch, MD  EQ STOOL SOFTENER/LAXATIVE 8.6-50 MG tablet TAKE ONE TABLET BY MOUTH ONCE DAILY 02/21/17   Hoyt Koch, MD  escitalopram (LEXAPRO) 20 MG tablet Take 1 tablet (20 mg total) by mouth daily. 07/05/17   Nche, Charlene Brooke, NP  fluticasone (FLONASE) 50 MCG/ACT nasal spray USE TWO  SPRAY(S) IN EACH NOSTRIL ONCE DAILY 03/29/17   Hoyt Koch, MD  gabapentin (NEURONTIN) 300 MG capsule Take 1 capsule by mouth daily. 07/18/17 08/17/17  [provider]  loratadine (CLARITIN) 10 MG tablet TAKE ONE TABLET BY MOUTH ONCE DAILY 08/29/17   Hoyt Koch, MD  losartan (COZAAR) 50 MG tablet TAKE 1 TABLET BY MOUTH ONCE DAILY 07/05/17   Golden Circle, FNP  metaxalone (SKELAXIN) 800 MG tablet Take 800 mg by mouth 3 (three) times daily.    [provider]  Oxycodone HCl 10 MG TABS Take 1 tablet by mouth every 8 (eight) hours as needed. 03/26/16   [provider]  PROAIR HFA 108 (90 Base) MCG/ACT inhaler INHALE TWO PUFFS BY MOUTH EVERY 6 HOURS AS NEEDED FOR WHEEZING OR SHORTNESS  OF BREATH 08/29/17   Hoyt Koch, MD  PROCTO-MED [facility] 2.5 % rectal cream APPLY CREAM TOPICALLY RECTALLY TWO TIMES DAILY 08/29/17   Hoyt Koch, MD  promethazine (PHENERGAN) 12.5 MG tablet Take 12.5 mg by mouth every 6 (six) hours as needed for nausea or vomiting.    [provider]  ranitidine (ZANTAC) 150 MG tablet Take 1 tablet (150 mg total) by mouth 2 (two) times daily. 07/05/17   Nche, Charlene Brooke, NP  SENNA-S 8.6-50 MG tablet TAKE ONE TABLET BY MOUTH ONCE DAILY 12/01/15   Hoyt Koch, MD  tiZANidine (ZANAFLEX) 4 MG tablet Take 4 mg by mouth 3 (three) times daily.    [provider]    Family History Family History  Problem Relation Age of Onset  . Breast cancer Mother        over 65 when dx  . Diverticulitis Mother   . Arthritis Mother   . Hyperlipidemia Mother   . Hypertension Mother   . Pancreatic cancer Paternal Grandmother   . Breast cancer Paternal Aunt        x 2 pat aunts  . Irritable bowel syndrome Daughter   . Hypotension Daughter   . Alcohol abuse Son   . Alcohol abuse Paternal Uncle   . Alcohol abuse Maternal Uncle        x several  . Colon cancer Other        maternal grandmothers sister     Social History Social History  Substance Use Topics  . Smoking status: Current Every Day Smoker    Packs/day: 1.00    Years: 18.00    Types: Cigarettes  . Smokeless tobacco: Never Used     Comment: Tobacco info given 07/30/17  . Alcohol use No     Allergies   Morphine and related   Review of Systems Review of Systems ROS reviewed and all  are negative for acute change except as noted in the HPI.  Physical Exam Updated Vital Signs BP (!) 176/109   Pulse (!) 106   Temp 99.2 F (37.3 C) (Oral)   Resp 18   Ht 4\' 11"  (1.499 m)   Wt 77.1 kg (170 lb)   SpO2 98%   BMI 34.34 kg/m   Physical Exam  Constitutional: She is oriented to person, place, and time. Vital signs are normal. She appears well-developed and well-nourished.  HENT:  Head: Normocephalic and atraumatic.  Right Ear: Hearing normal.  Left Ear: Hearing normal.  Eyes: Pupils are equal, round, and reactive to light. Conjunctivae and EOM are normal.  Neck: Normal range of motion. Neck supple.  Cardiovascular: Normal rate, regular rhythm, normal heart sounds and intact distal pulses.   Pulmonary/Chest: Effort normal and breath sounds normal.  Abdominal: Soft. Normal appearance and bowel sounds are normal. There is tenderness in the right lower quadrant. There is no rigidity, no rebound, no guarding, no CVA tenderness, no tenderness at McBurney's point and negative Murphy's sign.  Abdomen soft  Musculoskeletal: Normal range of motion.  Neurological: She is alert and oriented to person, place, and time.  Skin: Skin is warm and dry.  Psychiatric: She has a normal mood and affect. Her speech is normal and behavior is normal. Thought content normal.  Nursing note and vitals reviewed.  Exam performed by Ozella Rocks,  exam chaperoned Date: 09/09/2017 Pelvic exam: normal external genitalia without evidence of trauma. VULVA: normal appearing vulva with no masses, tenderness or lesion. VAGINA: normal appearing vagina  with normal color and discharge, no lesions. CERVIX: normal appearing cervix without lesions, cervical motion tenderness absent, cervical os closed with out purulent discharge; vaginal discharge - bloody and scant, Wet prep and DNA probe for chlamydia and GC obtained.   ADNEXA: normal adnexa in size, nontender and no masses UTERUS: uterus is normal size, shape, consistency and nontender.   ED Treatments / Results  Labs (all labs ordered are listed, but only abnormal results are displayed) Labs Reviewed  COMPREHENSIVE METABOLIC PANEL - Abnormal; Notable for the following:       Result Value   Sodium 133 (*)    Glucose, Bld 132 (*)    BUN <5 (*)    Total Protein 8.4 (*)    ALT 13 (*)    Total Bilirubin 1.6 (*)    All other components within normal limits  CBC - Abnormal; Notable for the following:    WBC 14.8 (*)    All other components within normal limits  URINALYSIS, ROUTINE W REFLEX MICROSCOPIC - Abnormal; Notable for the following:    Color, Urine AMBER (*)    APPearance CLOUDY (*)    Hgb urine dipstick LARGE (*)    Protein, ur 100 (*)    Leukocytes, UA SMALL (*)    Bacteria, UA RARE (*)    All other components within normal limits  WET PREP, GENITAL  LIPASE, BLOOD  POC URINE PREG, ED  GC/CHLAMYDIA PROBE AMP ([place]) NOT AT [facility]    EKG  EKG Interpretation None       Radiology No results found.  Procedures Procedures (including critical care time)  Medications Ordered in ED Medications  sodium chloride 0.9 % bolus 500 mL (not administered)  ketorolac (TORADOL) 30 MG/ML injection 30 mg (30 mg Intravenous Given 09/09/17 0656)  ondansetron (ZOFRAN) injection 4 mg (4 mg Intravenous Given 09/09/17 0656)     Initial Impression /  Assessment and Plan / ED Course  I have reviewed the triage vital signs and the nursing notes.  Pertinent labs & imaging results that were available during my care of the patient were reviewed by me and considered in my medical  decision making (see chart for details).  Final Clinical Impressions(s) / ED Diagnoses  {I have reviewed and evaluated the relevant laboratory values.   {I have reviewed the relevant previous healthcare records.  {I obtained HPI from historian.   ED Course:  Assessment: Pt is a 46 y.o. female  with a hx of Asthma, HTN, presents to the Emergency Department today due to abdominal pain. This occurred several days ago. States pain is RLQ. Notes hx same. States pain occurs every month with menses. Notes worsening of normal pain with associated N/V x 2 last night. None this morning. No diarrhea. No CP/SOB. No fevers. No dysuria. Notes vaginal bleeding with passage of clots. States pain worse when passing clots, but then is relieved when clots pass. States menses last usually 5-7 days. Pt sexually active with one partner. Notes hx fibroids and undergoes treatment for this with OBGYN. On exam, pt in NAD. Nontoxic/nonseptic appearing. VSS. Afebrile. Lungs CTA. Heart RRR. Abdomen TTP RLQ. GU Exam unremarkable. Mild bleeding from cervix. No Adnexal. No CMT. Wet prep unremarkable. GC obtained. UA with evidence of UTI. Culture sent. CBC with WBC 14. CMP unremarkable. Plan is to DC home with follow up to OBGYN. At time of discharge, Patient is in no acute distress. Vital Signs are stable. Patient is able to ambulate. Patient able to tolerate PO.   Disposition/Plan:  DC Home Additional Verbal discharge instructions given and discussed with patient.  Pt Instructed to f/u with PCP in the next week for evaluation and treatment of symptoms. Return precautions given Pt acknowledges and agrees with plan  Supervising Physician Delora Fuel, MD  Final diagnoses:  Menorrhagia with regular cycle  Right lower quadrant abdominal pain  Urinary tract infection without hematuria, site unspecified    New Prescriptions New Prescriptions   No medications on file     Shary Decamp, Hershal Coria 19/[birth year]78 2956    Delora Fuel,  MD 21/30/86 [phone]

## 2017-09-09 NOTE — ED Triage Notes (Addendum)
Reports having RLQ pain that goes into back since yesterday.  Hx of fibroids with heavy vaginal bleeding.  Reports lots of blood clots.  Also c/o n/v.  History of low iron.

## 2017-09-09 NOTE — ED Notes (Signed)
Patient c/o abd.pain onset several days ago worse yest. C/o vaginal bleeding onset Sat, getting  Worse. States she vomited x1 at 10 pm and was able to sleep for a short period. Vomited again  Approx. 1 hours ago. States she pain is getting worse.

## 2017-09-09 NOTE — Discharge Instructions (Signed)
Please read and follow all provided instructions.  Your diagnoses today include:  1. Menorrhagia with regular cycle   2. Right lower quadrant abdominal pain     Tests performed today include: Vital signs. See below for your results today.   Medications prescribed:  Take as prescribed   Home care instructions:  Follow any educational materials contained in this packet.  Follow-up instructions: Please follow-up with your OBGYN for further evaluation of symptoms and treatment   Return instructions:  Please return to the Emergency Department if you do not get better, if you get worse, or new symptoms OR  - Fever (temperature greater than 101.54F)  - Bleeding that does not stop with holding pressure to the area    -Severe pain (please note that you may be more sore the day after your accident)  - Chest Pain  - Difficulty breathing  - Severe nausea or vomiting  - Inability to tolerate food and liquids  - Passing out  - Skin becoming red around your wounds  - Change in mental status (confusion or lethargy)  - New numbness or weakness    Please return if you have any other emergent concerns.  Additional Information:  Your vital signs today were: BP (!) 162/99    Pulse 86    Temp 99.2 F (37.3 C) (Oral)    Resp 18    Ht 4\' 11"  (1.499 m)    Wt 77.1 kg (170 lb)    SpO2 97%    BMI 34.34 kg/m  If your blood pressure (BP) was elevated above 135/85 this visit, please have this repeated by your doctor within one month. ---------------

## 2017-09-12 DIAGNOSIS — M5136 Other intervertebral disc degeneration, lumbar region: Secondary | ICD-10-CM | POA: Diagnosis not present

## 2017-09-12 DIAGNOSIS — M5417 Radiculopathy, lumbosacral region: Secondary | ICD-10-CM | POA: Diagnosis not present

## 2017-09-12 DIAGNOSIS — Z5181 Encounter for therapeutic drug level monitoring: Secondary | ICD-10-CM | POA: Diagnosis not present

## 2017-09-12 DIAGNOSIS — Z79899 Other long term (current) drug therapy: Secondary | ICD-10-CM | POA: Diagnosis not present

## 2017-09-12 DIAGNOSIS — M47816 Spondylosis without myelopathy or radiculopathy, lumbar region: Secondary | ICD-10-CM | POA: Diagnosis not present

## 2017-10-09 DIAGNOSIS — M5136 Other intervertebral disc degeneration, lumbar region: Secondary | ICD-10-CM | POA: Diagnosis not present

## 2017-10-30 ENCOUNTER — Other Ambulatory Visit: Payer: Self-pay | Admitting: Family

## 2017-10-30 ENCOUNTER — Other Ambulatory Visit: Payer: Self-pay | Admitting: Internal Medicine

## 2017-10-30 DIAGNOSIS — N946 Dysmenorrhea, unspecified: Secondary | ICD-10-CM | POA: Diagnosis not present

## 2017-10-30 DIAGNOSIS — N920 Excessive and frequent menstruation with regular cycle: Secondary | ICD-10-CM | POA: Diagnosis not present

## 2017-11-27 DIAGNOSIS — M5417 Radiculopathy, lumbosacral region: Secondary | ICD-10-CM | POA: Diagnosis not present

## 2017-11-27 DIAGNOSIS — M5030 Other cervical disc degeneration, unspecified cervical region: Secondary | ICD-10-CM | POA: Diagnosis not present

## 2017-11-27 DIAGNOSIS — M7918 Myalgia, other site: Secondary | ICD-10-CM | POA: Diagnosis not present

## 2017-11-27 DIAGNOSIS — M5136 Other intervertebral disc degeneration, lumbar region: Secondary | ICD-10-CM | POA: Diagnosis not present

## 2017-12-04 DIAGNOSIS — M5030 Other cervical disc degeneration, unspecified cervical region: Secondary | ICD-10-CM | POA: Diagnosis not present

## 2017-12-12 DIAGNOSIS — D219 Benign neoplasm of connective and other soft tissue, unspecified: Secondary | ICD-10-CM | POA: Diagnosis not present

## 2017-12-12 DIAGNOSIS — N920 Excessive and frequent menstruation with regular cycle: Secondary | ICD-10-CM | POA: Diagnosis not present

## 2017-12-12 DIAGNOSIS — D500 Iron deficiency anemia secondary to blood loss (chronic): Secondary | ICD-10-CM | POA: Diagnosis not present

## 2017-12-13 ENCOUNTER — Other Ambulatory Visit: Payer: Self-pay | Admitting: Internal Medicine

## 2017-12-13 DIAGNOSIS — Z1231 Encounter for screening mammogram for malignant neoplasm of breast: Secondary | ICD-10-CM

## 2018-01-01 ENCOUNTER — Other Ambulatory Visit: Payer: Self-pay | Admitting: Internal Medicine

## 2018-01-01 ENCOUNTER — Ambulatory Visit (INDEPENDENT_AMBULATORY_CARE_PROVIDER_SITE_OTHER): Payer: Medicare Other | Admitting: Internal Medicine

## 2018-01-01 ENCOUNTER — Encounter: Payer: Self-pay | Admitting: Internal Medicine

## 2018-01-01 ENCOUNTER — Other Ambulatory Visit: Payer: Self-pay | Admitting: Family

## 2018-01-01 VITALS — BP 122/82 | HR 85 | Temp 98.6°F | Ht 59.0 in | Wt 181.0 lb

## 2018-01-01 DIAGNOSIS — N6002 Solitary cyst of left breast: Secondary | ICD-10-CM

## 2018-01-01 DIAGNOSIS — M67479 Ganglion, unspecified ankle and foot: Secondary | ICD-10-CM | POA: Diagnosis not present

## 2018-01-01 DIAGNOSIS — F418 Other specified anxiety disorders: Secondary | ICD-10-CM | POA: Diagnosis not present

## 2018-01-01 DIAGNOSIS — Z23 Encounter for immunization: Secondary | ICD-10-CM

## 2018-01-01 MED ORDER — LOSARTAN POTASSIUM 50 MG PO TABS: 50.0000 mg | ORAL_TABLET | Freq: Every day | ORAL | 1 refills | Status: DC

## 2018-01-01 MED ORDER — ESCITALOPRAM OXALATE 20 MG PO TABS: 20.0000 mg | ORAL_TABLET | Freq: Every day | ORAL | 1 refills | Status: DC

## 2018-01-01 MED ORDER — ALBUTEROL SULFATE (2.5 MG/3ML) 0.083% IN NEBU: INHALATION_SOLUTION | RESPIRATORY_TRACT | 1 refills | Status: DC

## 2018-01-01 MED ORDER — BUSPIRONE HCL 7.5 MG PO TABS: 7.5000 mg | ORAL_TABLET | Freq: Two times a day (BID) | ORAL | 1 refills | Status: DC

## 2018-01-01 NOTE — Progress Notes (Signed)
   Subjective:    [name], female    DOB: [birth year], 47 y.o.   MRN: [id number]  HPI The patient is a 47 YO female coming in for several concerns including cyst in left breast (getting mammogram soon, has had cysts drained in the past, hurting more, she has noticed change on exam, declines increase in caffeine or chocolate recently), and cyst on her foot (having some pain with walking, noticed it first several months ago, overall stable in size since onset), and her depression (she is taking lexapro and buspar, started lexapro since our last visit and saw another provider who increased the dose, buspar is helping with her anxiety and denies panic attacks, she is under a lot of stress so lexapro is helping some with this, she is happy with where her symptoms are at although they are not gone).   Review of Systems  Constitutional: Negative.   HENT: Negative.   Eyes: Negative.   Respiratory: Negative for cough, chest tightness and shortness of breath.        Breast pain and cyst  Cardiovascular: Negative for chest pain, palpitations and leg swelling.  Gastrointestinal: Negative for abdominal distention, abdominal pain, constipation, diarrhea, nausea and vomiting.  Musculoskeletal: Positive for arthralgias and gait problem.  Skin: Negative.   Psychiatric/Behavioral: Negative.       Objective:   Physical Exam  Constitutional: She is oriented to person, place, and time. She appears well-developed and well-nourished.  HENT:  Head: Normocephalic and atraumatic.  Eyes: EOM are normal.  Neck: Normal range of motion.  Cardiovascular: Normal rate and regular rhythm.  Pulmonary/Chest: Effort normal and breath sounds normal. No respiratory distress. She has no wheezes. She has no rales.  Pain in the left breast consistent with cyst, no axillary LAD  Abdominal: Soft. Bowel sounds are normal. She exhibits no distension. There is no tenderness. There is no rebound.  Musculoskeletal:  She exhibits tenderness. She exhibits no edema.  Neurological: She is alert and oriented to person, place, and time. Coordination normal.  Skin: Skin is warm and dry.  Left foot with ganglion cyst  Psychiatric: She has a normal mood and affect.   Vitals:   01/01/18 0951  BP: 122/82  Pulse: 85  Temp: 98.6 F (37 C)  TempSrc: Oral  SpO2: 96%  Weight: 181 lb (82.1 kg)  Height: 4\' 11"  (1.499 m)      Assessment & Plan:  Flu shot given at visit

## 2018-01-01 NOTE — Patient Instructions (Addendum)
We will need to see you back for a physical within the next couple of months.

## 2018-01-03 DIAGNOSIS — M67479 Ganglion, unspecified ankle and foot: Secondary | ICD-10-CM | POA: Insufficient documentation

## 2018-01-03 DIAGNOSIS — N6002 Solitary cyst of left breast: Secondary | ICD-10-CM | POA: Insufficient documentation

## 2018-01-03 NOTE — Assessment & Plan Note (Addendum)
Refill lexapro and buspar. Symptoms stable although still mild symptoms admitted to. Denies SI/HI.

## 2018-01-03 NOTE — Assessment & Plan Note (Signed)
Diagnostic mammogram already ordered. Added left breast US to evaluate the cysts better. Can drain if indicated based on size. Has not had malignant findings in the past.

## 2018-01-03 NOTE — Assessment & Plan Note (Signed)
Referral to podiatry for evaluation.

## 2018-01-13 ENCOUNTER — Other Ambulatory Visit: Payer: Self-pay | Admitting: Internal Medicine

## 2018-01-13 DIAGNOSIS — N6002 Solitary cyst of left breast: Secondary | ICD-10-CM

## 2018-01-15 DIAGNOSIS — M5417 Radiculopathy, lumbosacral region: Secondary | ICD-10-CM | POA: Diagnosis not present

## 2018-01-15 DIAGNOSIS — Z79899 Other long term (current) drug therapy: Secondary | ICD-10-CM | POA: Diagnosis not present

## 2018-01-15 DIAGNOSIS — Z5181 Encounter for therapeutic drug level monitoring: Secondary | ICD-10-CM | POA: Diagnosis not present

## 2018-01-15 DIAGNOSIS — M5030 Other cervical disc degeneration, unspecified cervical region: Secondary | ICD-10-CM | POA: Diagnosis not present

## 2018-01-15 DIAGNOSIS — M5136 Other intervertebral disc degeneration, lumbar region: Secondary | ICD-10-CM | POA: Diagnosis not present

## 2018-01-16 ENCOUNTER — Encounter: Payer: Self-pay | Admitting: Podiatry

## 2018-01-16 ENCOUNTER — Ambulatory Visit (INDEPENDENT_AMBULATORY_CARE_PROVIDER_SITE_OTHER): Payer: Medicare Other | Admitting: Podiatry

## 2018-01-16 ENCOUNTER — Ambulatory Visit (INDEPENDENT_AMBULATORY_CARE_PROVIDER_SITE_OTHER): Payer: Medicare Other

## 2018-01-16 VITALS — BP 125/87 | HR 89 | Resp 16

## 2018-01-16 DIAGNOSIS — M67471 Ganglion, right ankle and foot: Secondary | ICD-10-CM | POA: Diagnosis not present

## 2018-01-16 NOTE — Progress Notes (Signed)
Subjective:  [name], female    DOB: 04-04-1971,  MRN: [id number] HPI Chief Complaint  Patient presents with  . Toe Pain    Hallux right - soft knot x 2 months, "I popped it once but it come back", larger now    47 y.o. female presents with the above complaint.     Past Medical History:  Diagnosis Date  . Allergic rhinitis   . Anal fissure   . Anemia   . Anxiety   . Arthritis   . Asthma    uses inhaler as needed  . Chronic neck pain    pain mgmt in [place]  . Depression   . Gallbladder disease   . GERD (gastroesophageal reflux disease)   . Headache(784.0)   . HTN (hypertension)    BP has been normal recently per pt and not taking meds  . Hyperthyroidism   . Obesity   . Thyroid nodule    incidental on MRI neck 2014, s/p endo eval  . Uterine fibroid    Past Surgical History:  Procedure Laterality Date  . CERVICAL SPINE SURGERY     2 disc removed  . [place] Malformation  2003  . CHOLECYSTECTOMY  1993  . DILITATION & CURRETTAGE/HYSTROSCOPY WITH NOVASURE ABLATION N/A 03/15/2014   Procedure: DILATATION & CURETTAGE/HYSTEROSCOPY WITH NOVASURE ABLATION;  Surgeon: Lovenia Kim, MD;  Location: [facility];  Service: Gynecology;  Laterality: N/A;  . Laberal tear  2001   Right Shoulder  . 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 WISDOM TOOTH EXTRACTION  1992    Current Outpatient Medications:  .  albuterol (PROVENTIL) (2.5 MG/3ML) 0.083% nebulizer solution, USE 1 VIAL IN NEBULIZER EVERY 6 HOURS AS NEEDED FOR WHEEZING OR SHORTNESS OF BREATH, Disp: 150 mL, Rfl: 1 .  busPIRone (BUSPAR) 5 MG tablet, , Disp: , Rfl:  .  cyclobenzaprine (FLEXERIL) 10 MG tablet, Take 10 mg by mouth 3 (three) times daily as needed for muscle spasms., Disp: , Rfl:  .  diclofenac sodium (VOLTAREN) 1 % GEL, Apply 2 g topically 3 (three) times daily as needed (for pain)., Disp: , Rfl:  .  DILT-XR 120 MG 24 hr capsule, TAKE ONE CAPSULE BY MOUTH ONCE DAILY, Disp:  30 capsule, Rfl: 3 .  EQ STOOL SOFTENER/LAXATIVE 8.6-50 MG tablet, TAKE ONE TABLET BY MOUTH ONCE DAILY, Disp: 30 tablet, Rfl: 11 .  escitalopram (LEXAPRO) 20 MG tablet, Take 1 tablet (20 mg total) by mouth daily., Disp: 90 tablet, Rfl: 1 .  fluticasone (FLONASE) 50 MCG/ACT nasal spray, USE 2 SPRAYS IN EACH NOSTRIL ONCE DAILY, Disp: 16 g, Rfl: 3 .  gabapentin (NEURONTIN) 300 MG capsule, Take 1 capsule by mouth daily., Disp: , Rfl:  .  loratadine (CLARITIN) 10 MG tablet, TAKE ONE TABLET BY MOUTH ONCE DAILY, Disp: 30 tablet, Rfl: 11 .  losartan (COZAAR) 50 MG tablet, Take 1 tablet (50 mg total) by mouth daily., Disp: 90 tablet, Rfl: 1 .  metaxalone (SKELAXIN) 800 MG tablet, Take 800 mg by mouth 3 (three) times daily., Disp: , Rfl:  .  ondansetron (ZOFRAN) 4 MG tablet, Take 1 tablet (4 mg total) by mouth every 6 (six) hours., Disp: 12 tablet, Rfl: 0 .  Oxycodone HCl 10 MG TABS, Take 1 tablet by mouth every 8 (eight) hours as needed (for pain). , Disp: , Rfl:  .  PROAIR HFA 108 (90 Base) MCG/ACT inhaler, INHALE TWO PUFFS BY MOUTH  EVERY 6 HOURS AS NEEDED FOR WHEEZING OR SHORTNESS  OF BREATH, Disp: 18 each, Rfl: 5 .  PROCTO-MED HC 2.5 % rectal cream, APPLY CREAM TOPICALLY RECTALLY TWO TIMES DAILY, Disp: 30 g, Rfl: 11 .  promethazine (PHENERGAN) 12.5 MG tablet, Take 12.5 mg by mouth every 6 (six) hours as needed for nausea or vomiting., Disp: , Rfl:  .  ranitidine (ZANTAC) 150 MG tablet, Take 1 tablet (150 mg total) by mouth 2 (two) times daily., Disp: 60 tablet, Rfl: 3 .  SENNA-S 8.6-50 MG tablet, TAKE ONE TABLET BY MOUTH ONCE DAILY, Disp: 30 tablet, Rfl: 0 .  tiZANidine (ZANAFLEX) 4 MG tablet, Take 4 mg by mouth every 8 (eight) hours as needed for muscle spasms. , Disp: , Rfl:  .  tranexamic acid (LYSTEDA) 650 MG TABS tablet, Take by mouth., Disp: , Rfl:   Allergies  Allergen Reactions  . Morphine And Related     Lowers BP too much   Review of Systems  All other systems reviewed and are  negative.  Objective:   Vitals:   01/16/18 1537  BP: 125/87  Pulse: 89  Resp: 16    General: Well developed, nourished, in no acute distress, alert and oriented x3   Dermatological: Skin is warm, dry and supple bilateral. Nails x 10 are well maintained; remaining integument appears unremarkable at this time. There are no open sores, no preulcerative lesions, no rash or signs of infection present.  Vascular: Dorsalis Pedis artery and Posterior Tibial artery pedal pulses are 2/4 bilateral with immedate capillary fill time. Pedal hair growth present. No varicosities and no lower extremity edema present bilateral.   Neruologic: Grossly intact via light touch bilateral. Vibratory intact via tuning fork bilateral. Protective threshold with Semmes Wienstein monofilament intact to all pedal sites bilateral. Patellar and Achilles deep tendon reflexes 2+ bilateral. No Babinski or clonus noted bilateral.   Musculoskeletal: No gross boney pedal deformities bilateral. No pain, crepitus, or limitation noted with foot and ankle range of motion bilateral. Muscular strength 5/5 in all groups tested bilateral.  Gait: Unassisted, Nonantalgic.    Radiographs:  No acute findings no signs of osteoarthritis of the hallux interphalangeal joint right.  Assessment & Plan:   Assessment: Mucoid cyst/ganglion cyst hallux medial aspect right.  Plan: Local anesthetic was utilized today to numb the surrounding tissue a total of 1 cc of half percent plain Marcaine was utilized.  Areas prepped and draped as normal sterile fashion.  I then performed a aspiration of the lesion receiving less than 1 cc of clear gelatinous fluid and then wrapped with a dry sterile compressive dressing she will leave this on until tomorrow.  Should this recur she will notify us immediately we did discuss the need for surgical intervention to excise and possibly fused IP joint.  She understands this and then will follow up with me as  needed.     Emily [name] T. [place], [place]

## 2018-01-17 ENCOUNTER — Ambulatory Visit
Admission: RE | Admit: 2018-01-17 | Discharge: 2018-01-17 | Disposition: A | Discharge status: Home / Self Care | Payer: Medicare Other | Source: Ambulatory Visit | Attending: Internal Medicine | Admitting: Internal Medicine

## 2018-01-17 ENCOUNTER — Other Ambulatory Visit: Payer: Self-pay | Admitting: Internal Medicine

## 2018-01-17 DIAGNOSIS — N6002 Solitary cyst of left breast: Secondary | ICD-10-CM

## 2018-01-17 DIAGNOSIS — N6012 Diffuse cystic mastopathy of left breast: Secondary | ICD-10-CM | POA: Diagnosis not present

## 2018-01-17 DIAGNOSIS — R922 Inconclusive mammogram: Secondary | ICD-10-CM | POA: Diagnosis not present

## 2018-01-29 ENCOUNTER — Other Ambulatory Visit: Payer: Self-pay | Admitting: Nurse Practitioner

## 2018-01-29 DIAGNOSIS — Z01812 Encounter for preprocedural laboratory examination: Secondary | ICD-10-CM | POA: Diagnosis not present

## 2018-01-29 DIAGNOSIS — R1013 Epigastric pain: Secondary | ICD-10-CM

## 2018-02-03 DIAGNOSIS — N809 Endometriosis, unspecified: Secondary | ICD-10-CM | POA: Diagnosis not present

## 2018-02-03 DIAGNOSIS — Z79899 Other long term (current) drug therapy: Secondary | ICD-10-CM | POA: Diagnosis not present

## 2018-02-03 DIAGNOSIS — N736 Female pelvic peritoneal adhesions (postinfective): Secondary | ICD-10-CM | POA: Diagnosis not present

## 2018-02-03 DIAGNOSIS — I10 Essential (primary) hypertension: Secondary | ICD-10-CM | POA: Diagnosis not present

## 2018-02-03 DIAGNOSIS — K219 Gastro-esophageal reflux disease without esophagitis: Secondary | ICD-10-CM | POA: Diagnosis not present

## 2018-02-03 DIAGNOSIS — N803 Endometriosis of pelvic peritoneum: Secondary | ICD-10-CM | POA: Diagnosis not present

## 2018-02-03 DIAGNOSIS — G43909 Migraine, unspecified, not intractable, without status migrainosus: Secondary | ICD-10-CM | POA: Diagnosis not present

## 2018-02-03 DIAGNOSIS — Z885 Allergy status to narcotic agent status: Secondary | ICD-10-CM | POA: Diagnosis not present

## 2018-02-03 DIAGNOSIS — J45909 Unspecified asthma, uncomplicated: Secondary | ICD-10-CM | POA: Diagnosis not present

## 2018-02-03 DIAGNOSIS — D219 Benign neoplasm of connective and other soft tissue, unspecified: Secondary | ICD-10-CM | POA: Diagnosis not present

## 2018-02-03 DIAGNOSIS — N946 Dysmenorrhea, unspecified: Secondary | ICD-10-CM | POA: Diagnosis not present

## 2018-02-03 DIAGNOSIS — Z7951 Long term (current) use of inhaled steroids: Secondary | ICD-10-CM | POA: Diagnosis not present

## 2018-02-03 HISTORY — PX: LAPAROSCOPIC TOTAL HYSTERECTOMY: SUR800

## 2018-02-04 DIAGNOSIS — G43909 Migraine, unspecified, not intractable, without status migrainosus: Secondary | ICD-10-CM | POA: Diagnosis not present

## 2018-02-04 DIAGNOSIS — I10 Essential (primary) hypertension: Secondary | ICD-10-CM | POA: Diagnosis not present

## 2018-02-04 DIAGNOSIS — J45909 Unspecified asthma, uncomplicated: Secondary | ICD-10-CM | POA: Diagnosis not present

## 2018-02-04 DIAGNOSIS — K219 Gastro-esophageal reflux disease without esophagitis: Secondary | ICD-10-CM | POA: Diagnosis not present

## 2018-02-04 DIAGNOSIS — Z885 Allergy status to narcotic agent status: Secondary | ICD-10-CM | POA: Diagnosis not present

## 2018-02-04 DIAGNOSIS — Z7951 Long term (current) use of inhaled steroids: Secondary | ICD-10-CM | POA: Diagnosis not present

## 2018-02-04 DIAGNOSIS — N803 Endometriosis of pelvic peritoneum: Secondary | ICD-10-CM | POA: Diagnosis not present

## 2018-02-04 DIAGNOSIS — Z79899 Other long term (current) drug therapy: Secondary | ICD-10-CM | POA: Diagnosis not present

## 2018-02-25 ENCOUNTER — Other Ambulatory Visit: Payer: Self-pay | Admitting: Internal Medicine

## 2018-02-26 ENCOUNTER — Telehealth: Payer: Self-pay

## 2018-02-26 NOTE — Telephone Encounter (Signed)
PA started on CoverMyMeds KEY: [place]

## 2018-03-13 DIAGNOSIS — M7918 Myalgia, other site: Secondary | ICD-10-CM | POA: Diagnosis not present

## 2018-03-13 DIAGNOSIS — M5417 Radiculopathy, lumbosacral region: Secondary | ICD-10-CM | POA: Diagnosis not present

## 2018-03-13 DIAGNOSIS — M5030 Other cervical disc degeneration, unspecified cervical region: Secondary | ICD-10-CM | POA: Diagnosis not present

## 2018-03-13 DIAGNOSIS — M5136 Other intervertebral disc degeneration, lumbar region: Secondary | ICD-10-CM | POA: Diagnosis not present

## 2018-04-01 ENCOUNTER — Ambulatory Visit: Payer: Medicare Other | Admitting: Podiatry

## 2018-04-02 DIAGNOSIS — R140 Abdominal distension (gaseous): Secondary | ICD-10-CM | POA: Diagnosis not present

## 2018-04-02 DIAGNOSIS — Z8719 Personal history of other diseases of the digestive system: Secondary | ICD-10-CM | POA: Diagnosis not present

## 2018-04-02 DIAGNOSIS — R1084 Generalized abdominal pain: Secondary | ICD-10-CM | POA: Diagnosis not present

## 2018-04-02 DIAGNOSIS — K5900 Constipation, unspecified: Secondary | ICD-10-CM | POA: Diagnosis not present

## 2018-04-04 ENCOUNTER — Other Ambulatory Visit: Payer: Self-pay | Admitting: Internal Medicine

## 2018-04-23 ENCOUNTER — Telehealth: Payer: Self-pay | Admitting: *Deleted

## 2018-04-23 NOTE — Telephone Encounter (Signed)
I told pt to go into a stiff non-bendable soled shoe, rest and ice the area protecting the skin from the ice with a sock, and take OTC medications she can take for a headache or bodyaches. Pt states understanding.

## 2018-04-23 NOTE — Telephone Encounter (Signed)
Pt asked what to do for her foot pain and swelling in the joint below the big toe until her appt tomorrow.

## 2018-04-24 ENCOUNTER — Ambulatory Visit (INDEPENDENT_AMBULATORY_CARE_PROVIDER_SITE_OTHER): Payer: Medicare Other

## 2018-04-24 ENCOUNTER — Ambulatory Visit (INDEPENDENT_AMBULATORY_CARE_PROVIDER_SITE_OTHER): Payer: Medicare Other | Admitting: Podiatry

## 2018-04-24 ENCOUNTER — Encounter: Payer: Self-pay | Admitting: Podiatry

## 2018-04-24 DIAGNOSIS — M779 Enthesopathy, unspecified: Secondary | ICD-10-CM

## 2018-04-24 DIAGNOSIS — M778 Other enthesopathies, not elsewhere classified: Secondary | ICD-10-CM

## 2018-04-24 MED ORDER — MELOXICAM 15 MG PO TABS: 15.0000 mg | ORAL_TABLET | Freq: Every day | ORAL | 3 refills | Status: DC

## 2018-04-24 NOTE — Progress Notes (Signed)
She presents today for follow-up of the first metatarsal phalangeal joint states that he drained the cyst and removed surgically and then did fine but the joint is becoming painful she refers to the first metatarsophalangeal joint of the right foot.  Objective: Vital signs are stable alert and oriented x3.  Radiographs taken today demonstrate no major acute findings.  Some joint space narrowing mild bunion deformity.  Otherwise there is no erythema cellulitis drainage or odor on physical exam.  Assessment: Capsulitis first metatarsophalangeal joint of the right foot.  Plan: After sterile Betadine skin prep I injected 20 mg Kenalog 5 mg Marcaine first metatarsophalangeal joint of the right foot.  Should this not resolve her symptoms we will consider other therapies.  I also wrote a prescription for meloxicam 15 mg 1 p.o. daily.

## 2018-05-08 ENCOUNTER — Encounter: Payer: Medicare Other | Admitting: Internal Medicine

## 2018-05-08 DIAGNOSIS — M5136 Other intervertebral disc degeneration, lumbar region: Secondary | ICD-10-CM | POA: Diagnosis not present

## 2018-05-14 ENCOUNTER — Encounter: Payer: Medicare Other | Admitting: Internal Medicine

## 2018-05-27 ENCOUNTER — Other Ambulatory Visit: Payer: Self-pay | Admitting: Internal Medicine

## 2018-06-10 ENCOUNTER — Ambulatory Visit: Payer: Medicare Other | Admitting: Podiatry

## 2018-06-11 ENCOUNTER — Encounter: Payer: Medicare Other | Admitting: Internal Medicine

## 2018-06-17 ENCOUNTER — Ambulatory Visit (INDEPENDENT_AMBULATORY_CARE_PROVIDER_SITE_OTHER): Payer: Medicare Other | Admitting: Internal Medicine

## 2018-06-17 ENCOUNTER — Other Ambulatory Visit (INDEPENDENT_AMBULATORY_CARE_PROVIDER_SITE_OTHER): Payer: Medicare Other

## 2018-06-17 ENCOUNTER — Encounter: Payer: Self-pay | Admitting: Internal Medicine

## 2018-06-17 VITALS — BP 110/70 | HR 82 | Temp 99.0°F | Ht 59.0 in | Wt 181.0 lb

## 2018-06-17 DIAGNOSIS — Z0001 Encounter for general adult medical examination with abnormal findings: Secondary | ICD-10-CM

## 2018-06-17 DIAGNOSIS — I10 Essential (primary) hypertension: Secondary | ICD-10-CM | POA: Diagnosis not present

## 2018-06-17 DIAGNOSIS — M545 Low back pain, unspecified: Secondary | ICD-10-CM

## 2018-06-17 DIAGNOSIS — M79672 Pain in left foot: Secondary | ICD-10-CM | POA: Diagnosis not present

## 2018-06-17 DIAGNOSIS — F418 Other specified anxiety disorders: Secondary | ICD-10-CM

## 2018-06-17 DIAGNOSIS — J4520 Mild intermittent asthma, uncomplicated: Secondary | ICD-10-CM

## 2018-06-17 DIAGNOSIS — F411 Generalized anxiety disorder: Secondary | ICD-10-CM | POA: Diagnosis not present

## 2018-06-17 DIAGNOSIS — R140 Abdominal distension (gaseous): Secondary | ICD-10-CM

## 2018-06-17 LAB — LIPID PANEL
Cholesterol: 157 mg/dL (ref 0–200)
HDL: 30.3 mg/dL — ABNORMAL LOW (ref >39.00)
LDL Cholesterol: 105 mg/dL — ABNORMAL HIGH (ref 0–99)
NonHDL: 126.94
Total CHOL/HDL Ratio: 5
Triglycerides: 108 mg/dL (ref 0.0–149.0)
VLDL: 21.6 mg/dL (ref 0.0–40.0)

## 2018-06-17 LAB — COMPREHENSIVE METABOLIC PANEL
ALT: 11 U/L (ref 0–35)
AST: 13 U/L (ref 0–37)
Albumin: 4.5 g/dL (ref 3.5–5.2)
Alkaline Phosphatase: 92 U/L (ref 39–117)
BUN: 9 mg/dL (ref 6–23)
CO2: 26 mEq/L (ref 19–32)
Calcium: 9.6 mg/dL (ref 8.4–10.5)
Chloride: 104 mEq/L (ref 96–112)
Creatinine, Ser: 0.8 mg/dL (ref 0.40–1.20)
GFR: 98.82 mL/min (ref >60.00)
Glucose, Bld: 129 mg/dL — ABNORMAL HIGH (ref 70–99)
Potassium: 4.1 mEq/L (ref 3.5–5.1)
Sodium: 136 mEq/L (ref 135–145)
Total Bilirubin: 0.7 mg/dL (ref 0.2–1.2)
Total Protein: 7.9 g/dL (ref 6.0–8.3)

## 2018-06-17 LAB — CBC
HCT: 42.3 % (ref 36.0–46.0)
Hemoglobin: 14.4 g/dL (ref 12.0–15.0)
MCHC: 33.9 g/dL (ref 30.0–36.0)
MCV: 87.5 fl (ref 78.0–100.0)
Platelets: 269 10*3/uL (ref 150.0–400.0)
RBC: 4.84 Mil/uL (ref 3.87–5.11)
RDW: 13.4 % (ref 11.5–15.5)
WBC: 10.7 10*3/uL — ABNORMAL HIGH (ref 4.0–10.5)

## 2018-06-17 LAB — HEMOGLOBIN A1C: Hgb A1c MFr Bld: 5.4 % (ref 4.6–6.5)

## 2018-06-17 NOTE — Assessment & Plan Note (Signed)
Flu shot yearly. Pneumonia not indicated. Shingrix not indicated. Tetanus up to date. Colonoscopy not indicated. Mammogram with gyn, pap smear with gyn and overdue. Counseled about sun safety and mole surveillance. Counseled about the dangers of distracted driving. Given 10 year screening recommendations.

## 2018-06-17 NOTE — Assessment & Plan Note (Signed)
BP at goal on diltiazem and losartan. Checking CMP and adjust as needed.

## 2018-06-17 NOTE — Assessment & Plan Note (Signed)
Not well controlled, she is doing some counseling now but not regular. She is taking lexapro and buspar and declines wanting to change. We talked about how smoking can increase anxiety and stopping would help (she is smoking more currently) and how she should consider some anger management classes to help with her overall anger and frustration and then some depression once the anger fades.

## 2018-06-17 NOTE — Assessment & Plan Note (Signed)
Uses albuterol prn and no flare recently. Usually flared by allergies.

## 2018-06-17 NOTE — Patient Instructions (Addendum)
We are checking the labs today and will call you back about the results.   Come back with our wellness nurse Sharee Pimple to have a wellness visit (free from insurance) to see if there are any changes we can do to help you. '  You could even consider doing some anger management classes to help you develop ways to handle your feelings.  Work on stopping smoking as this can help with some back pain and lung function.   Health Maintenance, Female Adopting a healthy lifestyle and getting preventive care can go a long way to promote health and wellness. Talk with your health care provider about what schedule of regular examinations is right for you. This is a good chance for you to check in with your provider about disease prevention and staying healthy. In between checkups, there are plenty of things you can do on your own. Experts have done a lot of research about which lifestyle changes and preventive measures are most likely to keep you healthy. Ask your health care provider for more information. Weight and diet Eat a healthy diet  Be sure to include plenty of vegetables, fruits, low-fat dairy products, and lean protein.  Do not eat a lot of foods high in solid fats, added sugars, or salt.  Get regular exercise. This is one of the most important things you can do for your health. ? Most adults should exercise for at least 150 minutes each week. The exercise should increase your heart rate and make you sweat (moderate-intensity exercise). ? Most adults should also do strengthening exercises at least twice a week. This is in addition to the moderate-intensity exercise.  Maintain a healthy weight  Body mass index (BMI) is a measurement that can be used to identify possible weight problems. It estimates body fat based on height and weight. Your health care provider can help determine your BMI and help you achieve or maintain a healthy weight.  For females 74 years of age and older: ? A BMI below 18.5  is considered underweight. ? A BMI of 18.5 to 24.9 is normal. ? A BMI of 25 to 29.9 is considered overweight. ? A BMI of 30 and above is considered obese.  Watch levels of cholesterol and blood lipids  You should start having your blood tested for lipids and cholesterol at 47 years of age, then have this test every 5 years.  You may need to have your cholesterol levels checked more often if: ? Your lipid or cholesterol levels are high. ? You are older than 47 years of age. ? You are at high risk for heart disease.  Cancer screening Lung Cancer  Lung cancer screening is recommended for adults 67-34 years old who are at high risk for lung cancer because of a history of smoking.  A yearly low-dose CT scan of the lungs is recommended for people who: ? Currently smoke. ? Have quit within the past 15 years. ? Have at least a 30-pack-year history of smoking. A pack year is smoking an average of one pack of cigarettes a day for 1 year.  Yearly screening should continue until it has been 15 years since you quit.  Yearly screening should stop if you develop a health problem that would prevent you from having lung cancer treatment.  Breast Cancer  Practice breast self-awareness. This means understanding how your breasts normally appear and feel.  It also means doing regular breast self-exams. Let your health care provider know about any changes, no  matter how small.  If you are in your 20s or 30s, you should have a clinical breast exam (CBE) by a health care provider every 1-3 years as part of a regular health exam.  If you are 35 or older, have a CBE every year. Also consider having a breast X-ray (mammogram) every year.  If you have a family history of breast cancer, talk to your health care provider about genetic screening.  If you are at high risk for breast cancer, talk to your health care provider about having an MRI and a mammogram every year.  Breast cancer gene (BRCA)  assessment is recommended for women who have family members with BRCA-related cancers. BRCA-related cancers include: ? Breast. ? Ovarian. ? Tubal. ? Peritoneal cancers.  Results of the assessment will determine the need for genetic counseling and BRCA1 and BRCA2 testing.  Cervical Cancer Your health care provider may recommend that you be screened regularly for cancer of the pelvic organs (ovaries, uterus, and vagina). This screening involves a pelvic examination, including checking for microscopic changes to the surface of your cervix (Pap test). You may be encouraged to have this screening done every 3 years, beginning at age 3.  For women ages 76-65, health care providers may recommend pelvic exams and Pap testing every 3 years, or they may recommend the Pap and pelvic exam, combined with testing for human papilloma virus (HPV), every 5 years. Some types of HPV increase your risk of cervical cancer. Testing for HPV may also be done on women of any age with unclear Pap test results.  Other health care providers may not recommend any screening for nonpregnant women who are considered low risk for pelvic cancer and who do not have symptoms. Ask your health care provider if a screening pelvic exam is right for you.  If you have had past treatment for cervical cancer or a condition that could lead to cancer, you need Pap tests and screening for cancer for at least 20 years after your treatment. If Pap tests have been discontinued, your risk factors (such as having a new sexual partner) need to be reassessed to determine if screening should resume. Some women have medical problems that increase the chance of getting cervical cancer. In these cases, your health care provider may recommend more frequent screening and Pap tests.  Colorectal Cancer  This type of cancer can be detected and often prevented.  Routine colorectal cancer screening usually begins at 47 years of age and continues through 47  years of age.  Your health care provider may recommend screening at an earlier age if you have risk factors for colon cancer.  Your health care provider may also recommend using home test kits to check for hidden blood in the stool.  A small camera at the end of a tube can be used to examine your colon directly (sigmoidoscopy or colonoscopy). This is done to check for the earliest forms of colorectal cancer.  Routine screening usually begins at age 88.  Direct examination of the colon should be repeated every 5-10 years through 47 years of age. However, you may need to be screened more often if early forms of precancerous polyps or small growths are found.  Skin Cancer  Check your skin from head to toe regularly.  Tell your health care provider about any new moles or changes in moles, especially if there is a change in a mole's shape or color.  Also tell your health care provider if you have  a mole that is larger than the size of a pencil eraser.  Always use sunscreen. Apply sunscreen liberally and repeatedly throughout the day.  Protect yourself by wearing long sleeves, pants, a wide-brimmed hat, and sunglasses whenever you are outside.  Heart disease, diabetes, and high blood pressure  High blood pressure causes heart disease and increases the risk of stroke. High blood pressure is more likely to develop in: ? People who have blood pressure in the high end of the normal range (130-139/85-89 mm Hg). ? People who are overweight or obese. ? People who are African American.  If you are 36-36 years of age, have your blood pressure checked every 3-5 years. If you are 5 years of age or older, have your blood pressure checked every year. You should have your blood pressure measured twice-once when you are at a hospital or clinic, and once when you are not at a hospital or clinic. Record the average of the two measurements. To check your blood pressure when you are not at a hospital or  clinic, you can use: ? An automated blood pressure machine at a pharmacy. ? A home blood pressure monitor.  If you are between 66 years and 66 years old, ask your health care provider if you should take aspirin to prevent strokes.  Have regular diabetes screenings. This involves taking a blood sample to check your fasting blood sugar level. ? If you are at a normal weight and have a low risk for diabetes, have this test once every three years after 47 years of age. ? If you are overweight and have a high risk for diabetes, consider being tested at a younger age or more often. Preventing infection Hepatitis B  If you have a higher risk for hepatitis B, you should be screened for this virus. You are considered at high risk for hepatitis B if: ? You were born in a country where hepatitis B is common. Ask your health care provider which countries are considered high risk. ? Your parents were born in a high-risk country, and you have not been immunized against hepatitis B (hepatitis B vaccine). ? You have HIV or AIDS. ? You use needles to inject street drugs. ? You live with someone who has hepatitis B. ? You have had sex with someone who has hepatitis B. ? You get hemodialysis treatment. ? You take certain medicines for conditions, including cancer, organ transplantation, and autoimmune conditions.  Hepatitis C  Blood testing is recommended for: ? Everyone born from [birth year] through 1965. ? Anyone with known risk factors for hepatitis C.  Sexually transmitted infections (STIs)  You should be screened for sexually transmitted infections (STIs) including gonorrhea and chlamydia if: ? You are sexually active and are younger than 47 years of age. ? You are older than 47 years of age and your health care provider tells you that you are at risk for this type of infection. ? Your sexual activity has changed since you were last screened and you are at an increased risk for chlamydia or gonorrhea.  Ask your health care provider if you are at risk.  If you do not have HIV, but are at risk, it may be recommended that you take a prescription medicine daily to prevent HIV infection. This is called pre-exposure prophylaxis (PrEP). You are considered at risk if: ? You are sexually active and do not regularly use condoms or know the HIV status of your partner(s). ? You take drugs by injection. ?  You are sexually active with a partner who has HIV.  Talk with your health care provider about whether you are at high risk of being infected with HIV. If you choose to begin PrEP, you should first be tested for HIV. You should then be tested every 3 months for as long as you are taking PrEP. Pregnancy  If you are premenopausal and you may become pregnant, ask your health care provider about preconception counseling.  If you may become pregnant, take 400 to 800 micrograms (mcg) of folic acid every day.  If you want to prevent pregnancy, talk to your health care provider about birth control (contraception). Osteoporosis and menopause  Osteoporosis is a disease in which the bones lose minerals and strength with aging. This can result in serious bone fractures. Your risk for osteoporosis can be identified using a bone density scan.  If you are 70 years of age or older, or if you are at risk for osteoporosis and fractures, ask your health care provider if you should be screened.  Ask your health care provider whether you should take a calcium or vitamin D supplement to lower your risk for osteoporosis.  Menopause may have certain physical symptoms and risks.  Hormone replacement therapy may reduce some of these symptoms and risks. Talk to your health care provider about whether hormone replacement therapy is right for you. Follow these instructions at home:  Schedule regular health, dental, and eye exams.  Stay current with your immunizations.  Do not use any tobacco products including cigarettes,  chewing tobacco, or electronic cigarettes.  If you are pregnant, do not drink alcohol.  If you are breastfeeding, limit how much and how often you drink alcohol.  Limit alcohol intake to no more than 1 drink per day for nonpregnant women. One drink equals 12 ounces of beer, 5 ounces of wine, or 1 ounces of hard liquor.  Do not use street drugs.  Do not share needles.  Ask your health care provider for help if you need support or information about quitting drugs.  Tell your health care provider if you often feel depressed.  Tell your health care provider if you have ever been abused or do not feel safe at home. This information is not intended to replace advice given to you by your health care provider. Make sure you discuss any questions you have with your health care provider. Document Released: 05/21/2011 Document Revised: 04/12/2016 Document Reviewed: 08/09/2015 Elsevier Interactive Patient Education  [employer].

## 2018-06-17 NOTE — Progress Notes (Signed)
   Subjective:    [name], female    DOB: 01/20/1971, 47 y.o.   MRN: [id number]  HPI The patient is a 47 YO female coming in for physical.   HPI #2: She is also having many concerns including worsening anger with anxiety (she is seeing therapist and they are helping some but they are now sick and she cannot see them anymore, she is taking lexapro 20 mg daily and it is helping some, using buspar as well, not safe for benzos given her chronic opioid usage), and possible left foot swelling (bite on her left foot she woke up with about 1 week ago, she has used peroxide and alcohol on it topically, did have some redness and significant swelling, overall is going down some) and her back pain (she needs to go back to specialist, she is frustrated that she has been dealing with this for 18 years and no progress has been made even though she has had several surgeries  PMH, [facility], social history reviewed and updated.   Review of Systems  Constitutional: Positive for activity change. Negative for appetite change, chills, fatigue, fever and unexpected weight change.  HENT: Negative.   Eyes: Negative.   Respiratory: Negative.   Cardiovascular: Negative.   Gastrointestinal: Positive for abdominal distention and diarrhea. Negative for constipation, nausea, rectal pain and vomiting.  Musculoskeletal: Positive for arthralgias, back pain and myalgias. Negative for gait problem and joint swelling.  Skin: Negative.   Neurological: Negative.   Psychiatric/Behavioral: Positive for decreased concentration, dysphoric mood and sleep disturbance. Negative for self-injury and suicidal ideas. The patient is nervous/anxious.       Objective:   Physical Exam  Constitutional: She is oriented to person, place, and time. She appears well-developed and well-nourished.  HENT:  Head: Normocephalic and atraumatic.  Eyes: EOM are normal.  Neck: Normal range of motion.  Cardiovascular: Normal rate and  regular rhythm.  Pulmonary/Chest: Effort normal and breath sounds normal. No respiratory distress. She has no wheezes. She has no rales.  Abdominal: Soft. Bowel sounds are normal. She exhibits no distension. There is tenderness. There is no rebound.  Minimal diffuse tenderness  Musculoskeletal: She exhibits tenderness. She exhibits no edema.  Low back pain  Neurological: She is alert and oriented to person, place, and time. Coordination normal.  Skin: Skin is warm and dry.  Psychiatric: Her behavior is normal.   Vitals:   06/17/18 1048  BP: 110/70  Pulse: 82  Temp: 99 F (37.2 C)  TempSrc: Oral  SpO2: 96%  Weight: 181 lb (82.1 kg)  Height: 4\' 11"  (1.499 m)      Assessment & Plan:

## 2018-06-17 NOTE — Assessment & Plan Note (Signed)
She is taking opioids for this pain. She will go back to the back specialist. We talked about how this may not go away and she may need to focus on function rather than being pain free. She is not exercising and we talked about how this can help. Smoking has also been linked to increasing back pain and we talked about the importance of quitting.

## 2018-06-17 NOTE — Assessment & Plan Note (Signed)
Taking buspar, is not a candidate for benzos with concurrent opioid usage.

## 2018-06-17 NOTE — Assessment & Plan Note (Signed)
Seeing gi and was exacerbated by hysterectomy.

## 2018-06-17 NOTE — Assessment & Plan Note (Signed)
Two areas which could be compatible with bite or small abscess which appear to be healing. No indication for antibiotics today. She is advised that using alcohol or peroxide on healing wounds can delay healing or increase scar formation. She will use cortisone or antibiotic ointment on them if needed.

## 2018-06-20 DIAGNOSIS — M549 Dorsalgia, unspecified: Secondary | ICD-10-CM | POA: Diagnosis not present

## 2018-06-20 DIAGNOSIS — M4726 Other spondylosis with radiculopathy, lumbar region: Secondary | ICD-10-CM | POA: Diagnosis not present

## 2018-06-20 DIAGNOSIS — M5432 Sciatica, left side: Secondary | ICD-10-CM | POA: Diagnosis not present

## 2018-06-24 ENCOUNTER — Ambulatory Visit: Payer: Medicare Other | Admitting: Podiatry

## 2018-06-26 ENCOUNTER — Other Ambulatory Visit: Payer: Self-pay | Admitting: Orthopedic Surgery

## 2018-06-26 DIAGNOSIS — M5136 Other intervertebral disc degeneration, lumbar region: Secondary | ICD-10-CM | POA: Diagnosis not present

## 2018-06-26 DIAGNOSIS — M4726 Other spondylosis with radiculopathy, lumbar region: Secondary | ICD-10-CM

## 2018-06-26 DIAGNOSIS — Z79899 Other long term (current) drug therapy: Secondary | ICD-10-CM | POA: Diagnosis not present

## 2018-06-26 DIAGNOSIS — M7918 Myalgia, other site: Secondary | ICD-10-CM | POA: Diagnosis not present

## 2018-06-26 DIAGNOSIS — Z5181 Encounter for therapeutic drug level monitoring: Secondary | ICD-10-CM | POA: Diagnosis not present

## 2018-06-26 DIAGNOSIS — M5417 Radiculopathy, lumbosacral region: Secondary | ICD-10-CM | POA: Diagnosis not present

## 2018-06-26 DIAGNOSIS — M5030 Other cervical disc degeneration, unspecified cervical region: Secondary | ICD-10-CM | POA: Diagnosis not present

## 2018-07-01 ENCOUNTER — Ambulatory Visit (INDEPENDENT_AMBULATORY_CARE_PROVIDER_SITE_OTHER): Payer: Medicare Other | Admitting: Podiatry

## 2018-07-01 ENCOUNTER — Encounter: Payer: Self-pay | Admitting: Podiatry

## 2018-07-01 DIAGNOSIS — M67471 Ganglion, right ankle and foot: Secondary | ICD-10-CM

## 2018-07-02 NOTE — Progress Notes (Signed)
She presents today states that the capsulitis of the first metatarsophalangeal joint has resolved but the cyst has returned as she points to the hallux right.  Objective: Vital signs are stable she is alert and oriented x3.  Pulses are palpable.  Neurologic sensorium is intact.  Ganglion cyst is noted to the medial aspect of the hallux interphalangeal joint of the right foot mildly tender on palpation.  Does not demonstrate any signs of infection.  Assessment: Ganglion cyst right IPJ.  Plan: After local anesthetic was administered Betadine was applied to the skin and drainage of approximately 1 cc from the cyst was performed via aspiration.  Tolerated procedure well post compression dressing follow-up with me in the near future for possible hallux interphalangeal joint fusion and excision of the cyst.

## 2018-07-08 ENCOUNTER — Ambulatory Visit
Admission: RE | Admit: 2018-07-08 | Discharge: 2018-07-08 | Disposition: A | Discharge status: Home / Self Care | Payer: Medicare Other | Source: Ambulatory Visit | Attending: Orthopedic Surgery | Admitting: Orthopedic Surgery

## 2018-07-08 DIAGNOSIS — M4726 Other spondylosis with radiculopathy, lumbar region: Secondary | ICD-10-CM

## 2018-07-08 DIAGNOSIS — M545 Low back pain: Secondary | ICD-10-CM | POA: Diagnosis not present

## 2018-07-11 DIAGNOSIS — M47816 Spondylosis without myelopathy or radiculopathy, lumbar region: Secondary | ICD-10-CM | POA: Diagnosis not present

## 2018-07-11 DIAGNOSIS — M5412 Radiculopathy, cervical region: Secondary | ICD-10-CM | POA: Diagnosis not present

## 2018-07-11 DIAGNOSIS — G894 Chronic pain syndrome: Secondary | ICD-10-CM | POA: Diagnosis not present

## 2018-07-22 ENCOUNTER — Ambulatory Visit
Admission: RE | Admit: 2018-07-22 | Discharge: 2018-07-22 | Disposition: A | Discharge status: Home / Self Care | Payer: Medicare Other | Source: Ambulatory Visit | Attending: Internal Medicine | Admitting: Internal Medicine

## 2018-07-22 DIAGNOSIS — N6002 Solitary cyst of left breast: Secondary | ICD-10-CM

## 2018-07-22 DIAGNOSIS — N6310 Unspecified lump in the right breast, unspecified quadrant: Secondary | ICD-10-CM | POA: Diagnosis not present

## 2018-07-28 DIAGNOSIS — M47816 Spondylosis without myelopathy or radiculopathy, lumbar region: Secondary | ICD-10-CM | POA: Diagnosis not present

## 2018-07-28 DIAGNOSIS — M5136 Other intervertebral disc degeneration, lumbar region: Secondary | ICD-10-CM | POA: Diagnosis not present

## 2018-07-28 DIAGNOSIS — M5030 Other cervical disc degeneration, unspecified cervical region: Secondary | ICD-10-CM | POA: Diagnosis not present

## 2018-07-28 DIAGNOSIS — M5417 Radiculopathy, lumbosacral region: Secondary | ICD-10-CM | POA: Diagnosis not present

## 2018-07-28 NOTE — Progress Notes (Signed)
Subjective:   Emily [name] is a 47 y.o. female who presents for Medicare Annual (Subsequent) preventive examination.  Review of Systems:  No ROS.  Medicare Wellness Visit. Additional risk factors are reflected in the social history.  Cardiac Risk Factors include: advanced age (>9men, >62 women);hypertension;obesity (BMI >30kg/m2) Sleep patterns: has interrupted sleep, has restless sleep, gets up 1-2 times nightly to void and sleeps 4-5 hours nightly. Patient reports insomnia issues, discussed recommended sleep tips and stress reduction tips, education was was provided via emmi.  Home Safety/Smoke Alarms: Feels safe in home. Smoke alarms in place.  Living environment; residence and Firearm Safety: 1-story house/ trailer, no firearms. Lives with family, no needs for DME, good support system Seat Belt Safety/Bike Helmet: Wears seat belt.     Objective:     Vitals: BP 136/84   Pulse 83   Resp 17   Ht 4\' 11"  (1.499 m)   Wt 180 lb (81.6 kg)   LMP 07/20/2017   SpO2 98%   BMI 36.36 kg/m   Body mass index is 36.36 kg/m.  Advanced Directives 07/29/2018 09/09/2017 08/07/2017 05/09/2015 03/15/2014  Does Patient Have a Medical Advance Directive? No No Yes No Patient does not have advance directive;Patient would not like information  Type of Advance Directive - - [place];Living will - -  Would patient like information on creating a medical advance directive? Yes (ED - Information included in AVS) No - Patient declined - - -    Tobacco Social History   Tobacco Use  Smoking Status Current Every Day Smoker  . Packs/day: 1.00  . Years: 18.00  . Pack years: 18.00  . Types: Cigarettes  Smokeless Tobacco Never Used  Tobacco Comment   Tobacco info given 07/30/17     Ready to quit: Not Answered Counseling given: Not Answered Comment: Tobacco info given 07/30/17  Past Medical History:  Diagnosis Date  . Allergic rhinitis   . Anal fissure   . Anemia   . Anxiety    . Arthritis   . Asthma    uses inhaler as needed  . Chronic neck pain    pain mgmt in [place]  . Depression   . Gallbladder disease   . GERD (gastroesophageal reflux disease)   . Headache(784.0)   . HTN (hypertension)    BP has been normal recently per pt and not taking meds  . Hyperthyroidism   . Obesity   . Thyroid nodule    incidental on MRI neck 2014, s/p endo eval  . Uterine fibroid    Past Surgical History:  Procedure Laterality Date  . CERVICAL SPINE SURGERY     2 disc removed  . [place] Malformation  2003  . CHOLECYSTECTOMY  1993  . DILITATION & CURRETTAGE/HYSTROSCOPY WITH NOVASURE ABLATION N/A 03/15/2014   Procedure: DILATATION & CURETTAGE/HYSTEROSCOPY WITH NOVASURE ABLATION;  Surgeon: Lovenia Kim, MD;  Location: [facility];  Service: Gynecology;  Laterality: N/A;  . Laberal tear  2001   Right Shoulder  . LAPAROSCOPIC TOTAL HYSTERECTOMY  02/03/2018  . 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 WISDOM TOOTH EXTRACTION  1992   Family History  Problem Relation Age of Onset  . Breast cancer Mother        over 47 when dx  . Diverticulitis Mother   . Arthritis Mother   . Hyperlipidemia Mother   . Hypertension Mother   . Pancreatic cancer Paternal Grandmother   .  Breast cancer Paternal Aunt        x 2 pat aunts  . Irritable bowel syndrome Daughter   . Hypotension Daughter   . Alcohol abuse Son   . Alcohol abuse Paternal Uncle   . Alcohol abuse Maternal Uncle        x several  . Colon cancer Other        maternal grandmothers sister   Social History   Socioeconomic History  . Marital status: Divorced    Spouse name: Not on file  . Number of children: 2  . Years of education: Not on file  . Highest education level: Not on file  Occupational History  . Occupation: disabled  Social Needs  . Financial resource strain: Not hard at all  . Food insecurity:    Worry: Never true    Inability: Never true  . Transportation needs:     Medical: No    Non-medical: No  Tobacco Use  . Smoking status: Current Every Day Smoker    Packs/day: 1.00    Years: 18.00    Pack years: 18.00    Types: Cigarettes  . Smokeless tobacco: Never Used  . Tobacco comment: Tobacco info given 07/30/17  Substance and Sexual Activity  . Alcohol use: No    Alcohol/week: 0.0 standard drinks  . Drug use: No  . Sexual activity: Yes  Lifestyle  . Physical activity:    Days per week: 0 days    Minutes per session: 0 min  . Stress: Only a little  Relationships  . Social connections:    Talks on phone: More than three times a week    Gets together: More than three times a week    Attends religious service: Never    Active member of club or organization: Yes    Attends meetings of clubs or organizations: More than 4 times per year    Relationship status: Living with partner  Other Topics Concern  . Not on file  Social History Narrative   Lives w/fiance and daughter.   On disability.    Outpatient Encounter Medications as of 07/29/2018  Medication Sig  . albuterol (PROVENTIL) (2.5 MG/3ML) 0.083% nebulizer solution USE 1 VIAL IN NEBULIZER EVERY 6 HOURS AS NEEDED FOR WHEEZING OR SHORTNESS OF BREATH  . busPIRone (BUSPAR) 5 MG tablet   . cyclobenzaprine (FLEXERIL) 10 MG tablet Take 10 mg by mouth 3 (three) times daily as needed for muscle spasms.  . diclofenac sodium (VOLTAREN) 1 % GEL Apply 2 g topically 3 (three) times daily as needed (for pain).  Marland Kitchen dicyclomine (BENTYL) 20 MG tablet Take 20 mg by mouth 3 (three) times daily.  Marland Kitchen DILT-XR 120 MG 24 hr capsule TAKE 1 CAPSULE BY MOUTH ONCE DAILY  . escitalopram (LEXAPRO) 20 MG tablet Take 1 tablet (20 mg total) by mouth daily.  . fluticasone (FLONASE) 50 MCG/ACT nasal spray USE 2 SPRAY(S) IN EACH NOSTRIL ONCE DAILY  . gabapentin (NEURONTIN) 300 MG capsule Take 1 capsule by mouth daily.  Marland Kitchen loratadine (CLARITIN) 10 MG tablet TAKE ONE TABLET BY MOUTH ONCE DAILY  . losartan (COZAAR) 50 MG tablet TAKE  1 TABLET BY MOUTH ONCE DAILY  . meloxicam (MOBIC) 15 MG tablet Take 1 tablet (15 mg total) by mouth daily.  . metaxalone (SKELAXIN) 800 MG tablet Take 800 mg by mouth 3 (three) times daily.  . ondansetron (ZOFRAN) 4 MG tablet Take 1 tablet (4 mg total) by mouth every 6 (six) hours.  Marland Kitchen  Oxycodone HCl 10 MG TABS Take 1 tablet by mouth every 8 (eight) hours as needed (for pain).   Marland Kitchen PROAIR HFA 108 (90 Base) MCG/ACT inhaler INHALE TWO PUFFS BY MOUTH EVERY 6 HOURS AS NEEDED FOR WHEEZING OR SHORTNESS  OF BREATH  . PROCTO-MED HC 2.5 % rectal cream APPLY CREAM TOPICALLY RECTALLY TWO TIMES DAILY  . promethazine (PHENERGAN) 12.5 MG tablet Take 12.5 mg by mouth every 6 (six) hours as needed for nausea or vomiting.  . ranitidine (ZANTAC) 150 MG tablet TAKE 1 TABLET BY MOUTH TWICE DAILY  . SENNA-S 8.6-50 MG tablet TAKE 1 TABLET BY MOUTH ONCE DAILY  . tiZANidine (ZANAFLEX) 4 MG tablet Take 4 mg by mouth every 8 (eight) hours as needed for muscle spasms.   . [DISCONTINUED] predniSONE (STERAPRED UNI-PAK 21 TAB) 10 MG (21) TBPK tablet TAKE AS DIRECTED ON PACK   No facility-administered encounter medications on file as of 07/29/2018.     Activities of Daily Living In your present state of health, do you have any difficulty performing the following activities: 07/29/2018  Hearing? N  Vision? N  Difficulty concentrating or making decisions? N  Walking or climbing stairs? N  Dressing or bathing? N  Doing errands, shopping? N  Preparing Food and eating ? N  Using the Toilet? N  In the past six months, have you accidently leaked urine? N  Do you have problems with loss of bowel control? N  Managing your Medications? N  Managing your Finances? N  Housekeeping or managing your Housekeeping? N  Some recent data might be hidden    Patient Care Team: Hoyt Koch, MD as PCP - General (Internal Medicine) Zonia Kief, MD (Rehabilitation) Renato Shin, MD (Endocrinology) Brien Few, MD  (Obstetrics and Gynecology)    Assessment:   This is a routine wellness examination for Contrina. Physical assessment deferred to PCP.   Exercise Activities and Dietary recommendations Current Exercise Habits: The patient does not participate in regular exercise at present, Exercise limited by: orthopedic condition(s)  Diet (meal preparation, eat out, water intake, caffeinated beverages, dairy products, fruits and vegetables): in general, a "healthy" diet  , on average, 1-2 meals per day Reports poor appetite  Reviewed heart healthy diet. Encouraged patient to increase daily water and to supplement with ensure, samples and coupons provided. Discussed weight loss strategies. Relevant patient education assigned to patient using Emmi.  Goals    . Attending meditation / other stress management classes     Will seek counseling for anxiety and worry and overall depression that moves in and out  Stress Stress-related medical problems are becoming increasingly common. The body has a built-in physical response to stressful situations. Faced with pressure, challenge or danger, we need to react quickly. Our bodies release hormones such as cortisol and adrenaline to help do this. These hormones are part of the "fight or flight" response and affect the metabolic rate, heart rate and blood pressure, resulting in a heightened, stressed state that prepares the body for optimum performance in dealing with a stressful situation. It is likely that early man required these mechanisms to stay alive, but usually modern stresses do not call for this, and the same hormones released in today's world can damage health and reduce coping ability. CAUSES  Pressure to perform at work, at school or in sports.  Threats of physical violence.  Money worries.  Arguments.  Family conflicts.  Divorce or separation from significant other.  Bereavement.  New job or unemployment.  Changes in location.  Alcohol or  drug abuse. SOMETIMES, THERE IS NO PARTICULAR REASON FOR DEVELOPING STRESS. Almost all people are at risk of being stressed at some time in their lives. It is important to know that some stress is temporary and some is long term.  Temporary stress will go away when a situation is resolved. Most people can cope with short periods of stress, and it can often be relieved by relaxing, taking a walk or getting any type of exercise, chatting through issues with friends, or having a good night's sleep.  Chronic (long-term, continuous) stress is much harder to deal with. It can be psychologically and emotionally damaging. It can be harmful both for an individual and for friends and family. SYMPTOMS Everyone reacts to stress differently. There are some common effects that help [place] recognize it. In times of extreme stress, people may:  Shake uncontrollably.  Breathe faster and deeper than normal (hyperventilate).  Vomit.  For people with asthma, stress can trigger an attack.  For some people, stress may trigger migraine headaches, ulcers, and body pain. PHYSICAL EFFECTS OF STRESS MAY INCLUDE:  Loss of energy.  Skin problems.  Aches and pains resulting from tense muscles, including neck ache, backache and tension headaches.  Increased pain from arthritis and other conditions.  Irregular heart beat (palpitations).  Periods of irritability or anger.  Apathy or depression.  Anxiety (feeling uptight or worrying).  Unusual behavior.  Loss of appetite.  Comfort eating.  Lack of concentration.  Loss of, or decreased, sex-drive.  Increased smoking, drinking, or recreational drug use.  For women, missed periods.  Ulcers, joint pain, and muscle pain. Post-traumatic stress is the stress caused by any serious accident, strong emotional damage, or extremely difficult or violent experience such as rape or war. Post-traumatic stress victims can experience mixtures of emotions such as fear,  shame, depression, guilt or anger. It may include recurrent memories or images that may be haunting. These feelings can last for weeks, months or even years after the traumatic event that triggered them. Specialized treatment, possibly with medicines and psychological therapies, is available. If stress is causing physical symptoms, severe distress or making it difficult for you to function as normal, it is worth seeing your caregiver. It is important to remember that although stress is a usual part of life, extreme or prolonged stress can lead to other illnesses that will need treatment. It is better to visit a doctor sooner rather than later. Stress has been linked to the development of high blood pressure and heart disease, as well as insomnia and depression. There is no diagnostic test for stress since everyone reacts to it differently. But a caregiver will be able to spot the physical symptoms, such as:  Headaches.  Shingles.  Ulcers. Emotional distress such as intense worry, low mood or irritability should be detected when the doctor asks pertinent questions to identify any underlying problems that might be the cause. In case there are physical reasons for the symptoms, the doctor may also want to do some tests to exclude certain conditions. If you feel that you are suffering from stress, try to identify the aspects of your life that are causing it. Sometimes you may not be able to change or avoid them, but even a small change can have a positive ripple effect. A simple lifestyle change can make all the difference. STRATEGIES THAT CAN HELP DEAL WITH STRESS:  Delegating or sharing responsibilities.  Avoiding confrontations.  Learning to be more assertive.  Regular exercise.  Avoid using alcohol or street drugs to cope.  Eating a healthy, balanced diet, rich in fruit and vegetables and proteins.  Finding humor or absurdity in stressful situations.  Never taking on more than you know you  can handle comfortably.  Organizing your time better to get as much done as possible.  Talking to friends or family and sharing your thoughts and fears.  Listening to music or relaxation tapes.  Relaxation techniques like deep breathing, meditation, and yoga.  Tensing and then relaxing your muscles, starting at the toes and working up to the head and neck. If you think that you would benefit from help, either in identifying the things that are causing your stress or in learning techniques to help you relax, see a caregiver who is capable of helping you with this. Rather than relying on medications, it is usually better to try and identify the things in your life that are causing stress and try to deal with them. There are many techniques of managing stress including counseling, psychotherapy, aromatherapy, yoga, and exercise. Your caregiver can help you determine what is best for you. Document Released: 01/26/2003 Document Revised: 11/10/2013 Document Reviewed: 12/23/2007 [facility] Patient Information 2015 [place], [place]. This information is not intended to replace advice given to you by your health care provider. Make sure you discuss any questions you have with your health care provider.     . Patient Stated     I want to go fishing as much as possible. Enjoy nature, love family and worship God.        Fall Risk Fall Risk  07/29/2018 05/09/2015 11/04/2013  Falls in the past year? No No No   Depression Screen PHQ 2/9 Scores 07/29/2018 06/17/2018 05/09/2015 11/04/2013  PHQ - 2 Score 2 3 3 1   PHQ- 9 Score 11 14 10  -     Cognitive Function       Ad8 score reviewed for issues:  Issues making decisions: no  Less interest in hobbies / activities: no  Repeats questions, stories (family complaining): no  Trouble using ordinary gadgets (microwave, computer, phone):no  Forgets the month or year: no  Mismanaging finances: no  Remembering appts: no  Daily problems with thinking  and/or memory: no Ad8 score is= 0  Immunization History  Administered Date(s) Administered  . Influenza,inj,Quad PF,6+ Mos 11/04/2013, 12/16/2015, 08/22/2016, 01/01/2018  . Td 03/22/2010   Screening Tests Health Maintenance  Topic Date Due  . HIV Screening  05/02/1986  . INFLUENZA VACCINE  06/19/2018  . TETANUS/TDAP  03/22/2020      Plan:      Continue doing brain stimulating activities (puzzles, reading, adult coloring books, staying active) to keep memory sharp.   Continue to eat heart healthy diet (full of fruits, vegetables, whole grains, lean protein, water--limit salt, fat, and sugar intake) and increase physical activity as tolerated.  I have personally reviewed and noted the following in the patient's chart:   . Medical and social history . Use of alcohol, tobacco or illicit drugs  . Current medications and supplements . Functional ability and status . Nutritional status . Physical activity . Advanced directives . List of other physicians . Vitals . Screenings to include cognitive, depression, and falls . Referrals and appointments  In addition, I have reviewed and discussed with patient certain preventive protocols, quality metrics, and best practice recommendations. A written personalized care plan for preventive services as well as general preventive health recommendations were provided to patient.  Michiel Cowboy, RN  07/29/2018

## 2018-07-29 ENCOUNTER — Ambulatory Visit (INDEPENDENT_AMBULATORY_CARE_PROVIDER_SITE_OTHER): Payer: Medicare Other | Admitting: *Deleted

## 2018-07-29 VITALS — BP 136/84 | HR 83 | Resp 17 | Ht 59.0 in | Wt 180.0 lb

## 2018-07-29 DIAGNOSIS — Z0000 Encounter for general adult medical examination without abnormal findings: Secondary | ICD-10-CM

## 2018-07-29 DIAGNOSIS — Z23 Encounter for immunization: Secondary | ICD-10-CM

## 2018-07-29 NOTE — Progress Notes (Signed)
Medical screening examination/treatment/procedure(s) were performed by non-physician practitioner and as supervising physician I was immediately available for consultation/collaboration. I agree with above. Sadler Teschner A Arielis Leonhart, MD 

## 2018-07-29 NOTE — Patient Instructions (Addendum)
[employer] Line at [phone]  1-800-QUIT-NOW 306 292 9459). [employer]nz  www.auntbertha.com or down load app on smart phone  Aunt Berenice Primas website lists multiple social resources for individuals such as: food, health, money, house hold goods, transit, medical supplies, job training and legal services.   Continue doing brain stimulating activities (puzzles, reading, adult coloring books, staying active) to keep memory sharp.   Continue to eat heart healthy diet (full of fruits, vegetables, whole grains, lean protein, water--limit salt, fat, and sugar intake) and increase physical activity as tolerated.   [name] , Thank you for taking time to come for your Medicare Wellness Visit. I appreciate your ongoing commitment to your health goals. Please review the following plan we discussed and let me know if I can assist you in the future.   These are the goals we discussed: Goals    . Attending meditation / other stress management classes     Will seek counseling for anxiety and worry and overall depression that moves in and out  Stress Stress-related medical problems are becoming increasingly common. The body has a built-in physical response to stressful situations. Faced with pressure, challenge or danger, we need to react quickly. Our bodies release hormones such as cortisol and adrenaline to help do this. These hormones are part of the "fight or flight" response and affect the metabolic rate, heart rate and blood pressure, resulting in a heightened, stressed state that prepares the body for optimum performance in dealing with a stressful situation. It is likely that early man required these mechanisms to stay alive, but usually modern stresses do not call for this, and the same hormones released in today's world can damage health and reduce coping ability. CAUSES  Pressure to  perform at work, at school or in sports.  Threats of physical violence.  Money worries.  Arguments.  Family conflicts.  Divorce or separation from significant other.  Bereavement.  New job or unemployment.  Changes in location.  Alcohol or drug abuse. SOMETIMES, THERE IS NO PARTICULAR REASON FOR DEVELOPING STRESS. Almost all people are at risk of being stressed at some time in their lives. It is important to know that some stress is temporary and some is long term.  Temporary stress will go away when a situation is resolved. Most people can cope with short periods of stress, and it can often be relieved by relaxing, taking a walk or getting any type of exercise, chatting through issues with friends, or having a good night's sleep.  Chronic (long-term, continuous) stress is much harder to deal with. It can be psychologically and emotionally damaging. It can be harmful both for an individual and for friends and family. SYMPTOMS Everyone reacts to stress differently. There are some common effects that help [place] recognize it. In times of extreme stress, people may:  Shake uncontrollably.  Breathe faster and deeper than normal (hyperventilate).  Vomit.  For people with asthma, stress can trigger an attack.  For some people, stress may trigger migraine headaches, ulcers, and body pain. PHYSICAL EFFECTS OF STRESS MAY INCLUDE:  Loss of energy.  Skin problems.  Aches and pains resulting from tense muscles, including neck ache, backache and tension headaches.  Increased pain from arthritis and other conditions.  Irregular heart beat (palpitations).  Periods of irritability or anger.  Apathy or depression.  Anxiety (feeling uptight or worrying).  Unusual behavior.  Loss of appetite.  Comfort eating.  Lack of concentration.  Loss of, or decreased, sex-drive.  Increased smoking, drinking, or  recreational drug use.  For women, missed periods.  Ulcers, joint pain,  and muscle pain. Post-traumatic stress is the stress caused by any serious accident, strong emotional damage, or extremely difficult or violent experience such as rape or war. Post-traumatic stress victims can experience mixtures of emotions such as fear, shame, depression, guilt or anger. It may include recurrent memories or images that may be haunting. These feelings can last for weeks, months or even years after the traumatic event that triggered them. Specialized treatment, possibly with medicines and psychological therapies, is available. If stress is causing physical symptoms, severe distress or making it difficult for you to function as normal, it is worth seeing your caregiver. It is important to remember that although stress is a usual part of life, extreme or prolonged stress can lead to other illnesses that will need treatment. It is better to visit a doctor sooner rather than later. Stress has been linked to the development of high blood pressure and heart disease, as well as insomnia and depression. There is no diagnostic test for stress since everyone reacts to it differently. But a caregiver will be able to spot the physical symptoms, such as:  Headaches.  Shingles.  Ulcers. Emotional distress such as intense worry, low mood or irritability should be detected when the doctor asks pertinent questions to identify any underlying problems that might be the cause. In case there are physical reasons for the symptoms, the doctor may also want to do some tests to exclude certain conditions. If you feel that you are suffering from stress, try to identify the aspects of your life that are causing it. Sometimes you may not be able to change or avoid them, but even a small change can have a positive ripple effect. A simple lifestyle change can make all the difference. STRATEGIES THAT CAN HELP DEAL WITH STRESS:  Delegating or sharing responsibilities.  Avoiding confrontations.  Learning to be  more assertive.  Regular exercise.  Avoid using alcohol or street drugs to cope.  Eating a healthy, balanced diet, rich in fruit and vegetables and proteins.  Finding humor or absurdity in stressful situations.  Never taking on more than you know you can handle comfortably.  Organizing your time better to get as much done as possible.  Talking to friends or family and sharing your thoughts and fears.  Listening to music or relaxation tapes.  Relaxation techniques like deep breathing, meditation, and yoga.  Tensing and then relaxing your muscles, starting at the toes and working up to the head and neck. If you think that you would benefit from help, either in identifying the things that are causing your stress or in learning techniques to help you relax, see a caregiver who is capable of helping you with this. Rather than relying on medications, it is usually better to try and identify the things in your life that are causing stress and try to deal with them. There are many techniques of managing stress including counseling, psychotherapy, aromatherapy, yoga, and exercise. Your caregiver can help you determine what is best for you. Document Released: 01/26/2003 Document Revised: 11/10/2013 Document Reviewed: 12/23/2007 [facility] Patient Information 2015 [place], [place]. This information is not intended to replace advice given to you by your health care provider. Make sure you discuss any questions you have with your health care provider.     . Patient Stated     I want to go fishing as much as possible. Enjoy nature, love family and worship God.  This is a list of the screening recommended for you and due dates:  Health Maintenance  Topic Date Due  . HIV Screening  05/02/1986  . Flu Shot  06/19/2018  . Tetanus Vaccine  03/22/2020     Influenza Virus Vaccine (Flucelvax) What is this medicine? INFLUENZA VIRUS VACCINE (in floo EN zuh VAHY ruhs vak SEEN) helps to reduce the  risk of getting influenza also known as the flu. The vaccine only helps protect you against some strains of the flu. This medicine may be used for other purposes; ask your health care provider or pharmacist if you have questions. COMMON BRAND NAME(S): FLUCELVAX What should I tell my health care provider before I take this medicine? They need to know if you have any of these conditions: -bleeding disorder like hemophilia -fever or infection -Guillain-Barre syndrome or other neurological problems -immune system problems -infection with the human immunodeficiency virus (HIV) or AIDS -low blood platelet counts -multiple sclerosis -an unusual or allergic reaction to influenza virus vaccine, other medicines, foods, dyes or preservatives -pregnant or trying to get pregnant -breast-feeding How should I use this medicine? This vaccine is for injection into a muscle. It is given by a health care professional. A copy of Vaccine Information Statements will be given before each vaccination. Read this sheet carefully each time. The sheet may change frequently. Talk to your pediatrician regarding the use of this medicine in children. Special care may be needed. Overdosage: If you think you've taken too much of this medicine contact a poison control center or emergency room at once. Overdosage: If you think you have taken too much of this medicine contact a poison control center or emergency room at once. NOTE: This medicine is only for you. Do not share this medicine with others. What if I miss a dose? This does not apply. What may interact with this medicine? -chemotherapy or radiation therapy -medicines that lower your immune system like etanercept, anakinra, infliximab, and adalimumab -medicines that treat or prevent blood clots like warfarin -phenytoin -steroid medicines like prednisone or cortisone -theophylline -vaccines This list may not describe all possible interactions. Give your health  care provider a list of all the medicines, herbs, non-prescription drugs, or dietary supplements you use. Also tell them if you smoke, drink alcohol, or use illegal drugs. Some items may interact with your medicine. What should I watch for while using this medicine? Report any side effects that do not go away within 3 days to your doctor or health care professional. Call your health care provider if any unusual symptoms occur within 6 weeks of receiving this vaccine. You may still catch the flu, but the illness is not usually as bad. You cannot get the flu from the vaccine. The vaccine will not protect against colds or other illnesses that may cause fever. The vaccine is needed every year. What side effects may I notice from receiving this medicine? Side effects that you should report to your doctor or health care professional as soon as possible: -allergic reactions like skin rash, itching or hives, swelling of the face, lips, or tongue Side effects that usually do not require medical attention (Report these to your doctor or health care professional if they continue or are bothersome.): -fever -headache -muscle aches and pains -pain, tenderness, redness, or swelling at the injection site -tiredness This list may not describe all possible side effects. Call your doctor for medical advice about side effects. You may report side effects to FDA at 1-800-FDA-1088. Where  should I keep my medicine? The vaccine will be given by a health care professional in a clinic, pharmacy, doctor's office, or other health care setting. You will not be given vaccine doses to store at home. NOTE: This sheet is a summary. It may not cover all possible information. If you have questions about this medicine, talk to your doctor, pharmacist, or health care provider.  2018 Elsevier/Gold Standard (2011-10-17 14:06:47)

## 2018-08-06 DIAGNOSIS — M47816 Spondylosis without myelopathy or radiculopathy, lumbar region: Secondary | ICD-10-CM | POA: Diagnosis not present

## 2018-08-14 DIAGNOSIS — R140 Abdominal distension (gaseous): Secondary | ICD-10-CM | POA: Diagnosis not present

## 2018-08-27 DIAGNOSIS — M47816 Spondylosis without myelopathy or radiculopathy, lumbar region: Secondary | ICD-10-CM | POA: Diagnosis not present

## 2018-09-05 DIAGNOSIS — K219 Gastro-esophageal reflux disease without esophagitis: Secondary | ICD-10-CM | POA: Diagnosis not present

## 2018-09-05 DIAGNOSIS — R140 Abdominal distension (gaseous): Secondary | ICD-10-CM | POA: Diagnosis not present

## 2018-09-05 DIAGNOSIS — K581 Irritable bowel syndrome with constipation: Secondary | ICD-10-CM | POA: Diagnosis not present

## 2018-09-05 DIAGNOSIS — R1084 Generalized abdominal pain: Secondary | ICD-10-CM | POA: Diagnosis not present

## 2018-09-23 ENCOUNTER — Other Ambulatory Visit: Payer: Self-pay | Admitting: Internal Medicine

## 2018-09-23 DIAGNOSIS — R1013 Epigastric pain: Secondary | ICD-10-CM

## 2018-09-24 DIAGNOSIS — M7918 Myalgia, other site: Secondary | ICD-10-CM | POA: Diagnosis not present

## 2018-09-24 DIAGNOSIS — M5030 Other cervical disc degeneration, unspecified cervical region: Secondary | ICD-10-CM | POA: Diagnosis not present

## 2018-09-24 DIAGNOSIS — M5136 Other intervertebral disc degeneration, lumbar region: Secondary | ICD-10-CM | POA: Diagnosis not present

## 2018-09-24 DIAGNOSIS — M47816 Spondylosis without myelopathy or radiculopathy, lumbar region: Secondary | ICD-10-CM | POA: Diagnosis not present

## 2018-10-02 ENCOUNTER — Other Ambulatory Visit: Payer: Self-pay | Admitting: Internal Medicine

## 2018-10-06 DIAGNOSIS — J45909 Unspecified asthma, uncomplicated: Secondary | ICD-10-CM | POA: Diagnosis not present

## 2018-10-06 DIAGNOSIS — M47816 Spondylosis without myelopathy or radiculopathy, lumbar region: Secondary | ICD-10-CM | POA: Diagnosis not present

## 2018-10-06 DIAGNOSIS — M47817 Spondylosis without myelopathy or radiculopathy, lumbosacral region: Secondary | ICD-10-CM | POA: Diagnosis not present

## 2018-10-06 DIAGNOSIS — Z79899 Other long term (current) drug therapy: Secondary | ICD-10-CM | POA: Diagnosis not present

## 2018-10-06 DIAGNOSIS — I10 Essential (primary) hypertension: Secondary | ICD-10-CM | POA: Diagnosis not present

## 2018-10-06 DIAGNOSIS — Z885 Allergy status to narcotic agent status: Secondary | ICD-10-CM | POA: Diagnosis not present

## 2018-10-06 DIAGNOSIS — Z91048 Other nonmedicinal substance allergy status: Secondary | ICD-10-CM | POA: Diagnosis not present

## 2018-10-22 DIAGNOSIS — Z91048 Other nonmedicinal substance allergy status: Secondary | ICD-10-CM | POA: Diagnosis not present

## 2018-10-22 DIAGNOSIS — J45909 Unspecified asthma, uncomplicated: Secondary | ICD-10-CM | POA: Diagnosis not present

## 2018-10-22 DIAGNOSIS — M47817 Spondylosis without myelopathy or radiculopathy, lumbosacral region: Secondary | ICD-10-CM | POA: Diagnosis not present

## 2018-10-22 DIAGNOSIS — Z885 Allergy status to narcotic agent status: Secondary | ICD-10-CM | POA: Diagnosis not present

## 2018-10-22 DIAGNOSIS — M47816 Spondylosis without myelopathy or radiculopathy, lumbar region: Secondary | ICD-10-CM | POA: Diagnosis not present

## 2018-10-22 DIAGNOSIS — I10 Essential (primary) hypertension: Secondary | ICD-10-CM | POA: Diagnosis not present

## 2018-11-06 DIAGNOSIS — M5412 Radiculopathy, cervical region: Secondary | ICD-10-CM | POA: Diagnosis not present

## 2018-11-06 DIAGNOSIS — G8929 Other chronic pain: Secondary | ICD-10-CM | POA: Diagnosis not present

## 2018-11-25 ENCOUNTER — Other Ambulatory Visit: Payer: Self-pay | Admitting: Internal Medicine

## 2018-11-25 DIAGNOSIS — M5417 Radiculopathy, lumbosacral region: Secondary | ICD-10-CM | POA: Diagnosis not present

## 2018-11-25 DIAGNOSIS — F418 Other specified anxiety disorders: Secondary | ICD-10-CM

## 2018-11-25 DIAGNOSIS — M5030 Other cervical disc degeneration, unspecified cervical region: Secondary | ICD-10-CM | POA: Diagnosis not present

## 2018-11-25 DIAGNOSIS — Z5181 Encounter for therapeutic drug level monitoring: Secondary | ICD-10-CM | POA: Diagnosis not present

## 2018-11-25 DIAGNOSIS — Z79899 Other long term (current) drug therapy: Secondary | ICD-10-CM | POA: Diagnosis not present

## 2018-11-25 DIAGNOSIS — M5136 Other intervertebral disc degeneration, lumbar region: Secondary | ICD-10-CM | POA: Diagnosis not present

## 2018-12-01 ENCOUNTER — Other Ambulatory Visit: Payer: Self-pay | Admitting: Internal Medicine

## 2018-12-01 ENCOUNTER — Telehealth: Payer: Self-pay | Admitting: Internal Medicine

## 2018-12-01 ENCOUNTER — Ambulatory Visit (INDEPENDENT_AMBULATORY_CARE_PROVIDER_SITE_OTHER): Payer: Medicare Other | Admitting: Internal Medicine

## 2018-12-01 ENCOUNTER — Encounter: Payer: Self-pay | Admitting: Internal Medicine

## 2018-12-01 DIAGNOSIS — J4521 Mild intermittent asthma with (acute) exacerbation: Secondary | ICD-10-CM | POA: Diagnosis not present

## 2018-12-01 MED ORDER — FAMOTIDINE 20 MG PO TABS: 20.0000 mg | ORAL_TABLET | Freq: Every day | ORAL | 3 refills | Status: DC

## 2018-12-01 MED ORDER — PREDNISONE 20 MG PO TABS: 40.0000 mg | ORAL_TABLET | Freq: Every day | ORAL | 0 refills | Status: DC

## 2018-12-01 MED ORDER — DOXYCYCLINE HYCLATE 100 MG PO TABS: 100.0000 mg | ORAL_TABLET | Freq: Two times a day (BID) | ORAL | 0 refills | Status: DC

## 2018-12-01 MED ORDER — BENZONATATE 200 MG PO CAPS: 200.0000 mg | ORAL_CAPSULE | Freq: Three times a day (TID) | ORAL | 0 refills | Status: DC | PRN

## 2018-12-01 NOTE — Patient Instructions (Signed)
We have sent in tessalon perles to use 3 times a day if needed.   We have sent in prednisone to take 2 pills daily for 5 days.   We have sent in doxycycline to take 1 pill twice a day for 1 week.

## 2018-12-01 NOTE — Progress Notes (Signed)
   Subjective:   [name], female    DOB: 03-26-1971, 49 y.o.   MRN: [id number]  HPI The patient is a 48 y.o. female coming in for cold symptoms. Started 1-2 weeks ago. Has concurrent asthma. Main symptoms are: cough. SOB, drainage, sinus pressure, fevers and chills. Denies muscle aches or headaches. Overall it is worsening. Has tried flonase, claritin, albuterol nebulizer and inhaler. She is not smoking much since feeling sick but previous was smoking same as usual. Denies sick contacts. Is having a lot of SOB and using albuterol nebulizer 4-7 times per day.    Review of Systems  Constitutional: Positive for activity change, appetite change and chills. Negative for fatigue, fever and unexpected weight change.  HENT: Positive for congestion, postnasal drip, rhinorrhea and sinus pressure. Negative for ear discharge, ear pain, sinus pain, sneezing, sore throat, tinnitus, trouble swallowing and voice change.   Eyes: Negative.   Respiratory: Positive for cough, shortness of breath and wheezing. Negative for chest tightness.   Cardiovascular: Negative.   Gastrointestinal: Negative.   Musculoskeletal: Positive for myalgias.  Neurological: Negative.     Objective:  Physical Exam Constitutional:      Appearance: She is well-developed.  HENT:     Head: Normocephalic and atraumatic.     Comments: Oropharynx with redness and clear drainage, nose with swollen turbinates, TMs normal bilaterally.  Neck:     Musculoskeletal: Normal range of motion.     Thyroid: No thyromegaly.  Cardiovascular:     Rate and Rhythm: Normal rate and regular rhythm.  Pulmonary:     Effort: Pulmonary effort is normal. No respiratory distress.     Breath sounds: Wheezing present. No rales.  Abdominal:     Palpations: Abdomen is soft.  Lymphadenopathy:     Cervical: No cervical adenopathy.  Skin:    General: Skin is warm and dry.  Neurological:     Mental Status: She is alert and oriented to  person, place, and time.     Vitals:   12/01/18 1056  BP: 110/70  Pulse: 83  Temp: 98.3 F (36.8 C)  TempSrc: Oral  SpO2: 94%  Weight: 185 lb (83.9 kg)  Height: 4\' 11"  (1.499 m)    Assessment & Plan:

## 2018-12-01 NOTE — Telephone Encounter (Signed)
Copied from [place] [id number]. Topic: Quick Communication - See Telephone Encounter >> Dec 01, 2018  3:08 PM Bea Graff, NT wrote: CRM for notification. See Telephone encounter for: 12/01/18. Pt states that her insurance will not cover the benzonatate (TESSALON) 200 MG capsule and she would like to see if something else can be ordered.

## 2018-12-01 NOTE — Assessment & Plan Note (Addendum)
With flare today and rx for prednisone, doxycycline and tessalon perles. Continue albuterol prn and flonase and claritin.

## 2018-12-02 NOTE — Telephone Encounter (Signed)
This is the safest option for cough and can use over the counter tussin instead.

## 2018-12-02 NOTE — Telephone Encounter (Signed)
Patient informed of MD response and stated understanding  

## 2019-01-21 DIAGNOSIS — M5030 Other cervical disc degeneration, unspecified cervical region: Secondary | ICD-10-CM | POA: Diagnosis not present

## 2019-01-21 DIAGNOSIS — M5417 Radiculopathy, lumbosacral region: Secondary | ICD-10-CM | POA: Diagnosis not present

## 2019-01-21 DIAGNOSIS — M130 Polyarthritis, unspecified: Secondary | ICD-10-CM | POA: Diagnosis not present

## 2019-01-21 DIAGNOSIS — M5136 Other intervertebral disc degeneration, lumbar region: Secondary | ICD-10-CM | POA: Diagnosis not present

## 2019-02-03 ENCOUNTER — Other Ambulatory Visit: Payer: Self-pay | Admitting: Internal Medicine

## 2019-03-19 DIAGNOSIS — M5136 Other intervertebral disc degeneration, lumbar region: Secondary | ICD-10-CM | POA: Diagnosis not present

## 2019-03-19 DIAGNOSIS — M5417 Radiculopathy, lumbosacral region: Secondary | ICD-10-CM | POA: Diagnosis not present

## 2019-03-19 DIAGNOSIS — M5030 Other cervical disc degeneration, unspecified cervical region: Secondary | ICD-10-CM | POA: Diagnosis not present

## 2019-03-19 DIAGNOSIS — M130 Polyarthritis, unspecified: Secondary | ICD-10-CM | POA: Diagnosis not present

## 2019-03-22 ENCOUNTER — Other Ambulatory Visit: Payer: Self-pay | Admitting: Internal Medicine

## 2019-03-25 ENCOUNTER — Other Ambulatory Visit: Payer: Self-pay | Admitting: Podiatry

## 2019-04-24 DIAGNOSIS — M5136 Other intervertebral disc degeneration, lumbar region: Secondary | ICD-10-CM | POA: Diagnosis not present

## 2019-04-24 DIAGNOSIS — M5030 Other cervical disc degeneration, unspecified cervical region: Secondary | ICD-10-CM | POA: Diagnosis not present

## 2019-04-24 DIAGNOSIS — M130 Polyarthritis, unspecified: Secondary | ICD-10-CM | POA: Diagnosis not present

## 2019-04-24 DIAGNOSIS — M5417 Radiculopathy, lumbosacral region: Secondary | ICD-10-CM | POA: Diagnosis not present

## 2019-05-18 ENCOUNTER — Telehealth: Payer: Self-pay

## 2019-05-18 NOTE — Telephone Encounter (Signed)
Copied from [place] [id number]. Topic: Appointment Scheduling - Scheduling Inquiry for Clinic >> May 18, 2019  4:00 PM Rainey Pines A wrote: Patient thinks she has pink eye and a sinus infection for 4 days now. Patient would like to inform provider she is having a rf surgery on 05/27/2019 and cannot get surgery done if she has an infection. Patient would like to schedule an appointment . >> May 18, 2019  4:09 PM Para Skeans A wrote: [facility] appointment has been made for 6/30 with Dr.[name].

## 2019-05-18 NOTE — Telephone Encounter (Signed)
Noted  

## 2019-05-19 ENCOUNTER — Ambulatory Visit (INDEPENDENT_AMBULATORY_CARE_PROVIDER_SITE_OTHER): Payer: Medicare Other | Admitting: Internal Medicine

## 2019-05-19 ENCOUNTER — Encounter: Payer: Self-pay | Admitting: Internal Medicine

## 2019-05-19 ENCOUNTER — Other Ambulatory Visit: Payer: Self-pay | Admitting: Podiatry

## 2019-05-19 DIAGNOSIS — J301 Allergic rhinitis due to pollen: Secondary | ICD-10-CM | POA: Diagnosis not present

## 2019-05-19 MED ORDER — MONTELUKAST SODIUM 10 MG PO TABS: 10.0000 mg | ORAL_TABLET | Freq: Every day | ORAL | 3 refills | Status: DC

## 2019-05-19 MED ORDER — OLOPATADINE HCL 0.2 % OP SOLN: 1.0000 [drp] | Freq: Two times a day (BID) | OPHTHALMIC | 0 refills | Status: DC | PRN

## 2019-05-19 NOTE — Progress Notes (Signed)
Virtual Visit via Video Note  I connected with Emily [name] on 05/19/19 at  8:40 AM EDT by a video enabled telemedicine application and verified that I am speaking with the correct person using two identifiers.  The patient and the provider were at separate locations throughout the entire encounter.   I discussed the limitations of evaluation and management by telemedicine and the availability of in person appointments. The patient expressed understanding and agreed to proceed.  History of Present Illness: The patient is a 48 y.o. female with visit for concerns about pink eye/allergies. Started about 1 week ago when she was doing a lot of outdoor exposure. She got running eyes and crusting in the morning. Mild itching. Denies redness or pain in the eye. She had gotten a piece of bark in the eye while outdoors but denies pain or scratch on the eye. Denies change in vision. Taking claritin and flonase which is helping some with allergy symptoms. She feels like she needs something slightly better. Has no fevers or chills. No sore throat or cough. Denies SOB. Denies change in asthma. Overall it is stable but not improving. Has tried claritin and flonase  Observations/Objective: Appearance: normal, breathing appears normal, casual grooming, abdomen does appear distended, throat with clear drainage, eyes with mild draining, mental status is A and O times 3  Assessment and Plan: See problem oriented charting  Follow Up Instructions: rx singulair and flonase  I discussed the assessment and treatment plan with the patient. The patient was provided an opportunity to ask questions and all were answered. The patient agreed with the plan and demonstrated an understanding of the instructions.   The patient was advised to call back or seek an in-person evaluation if the symptoms worsen or if the condition fails to improve as anticipated.  Hoyt Koch, MD

## 2019-05-19 NOTE — Assessment & Plan Note (Signed)
Rx for pataday eye drops and singulair to help with allergies. Does not sound to be covid-19.

## 2019-05-26 ENCOUNTER — Telehealth: Payer: Self-pay

## 2019-05-26 NOTE — Telephone Encounter (Signed)
Copied from [place] [id number]. Topic: General - Inquiry >> May 26, 2019  2:55 PM Scherrie Gerlach wrote: Reason for CRM: pt states her eye is still draining, not bad, and swollen on the corner.  Pt had tele visit last week. Pt not sure if she should come in office or do another virtual vsit.  They cancelled her oblation surgery until this is better PLease advise

## 2019-05-26 NOTE — Telephone Encounter (Signed)
Patient informed and stated understanding.

## 2019-05-26 NOTE — Telephone Encounter (Signed)
This should not delay her surgery so she will have to ask her surgeon. She is cleared from my perspective.

## 2019-05-26 NOTE — Telephone Encounter (Signed)
Patient informed of MD response. States that it is getting better just very slowly was wondering how long it could take for the draining and swelling to go away, just trying to figure out when she can reschedule her oblation surgery.

## 2019-05-26 NOTE — Telephone Encounter (Signed)
This is just allergies and not dangerous. Has she been using the eye drops we sent in?

## 2019-06-09 ENCOUNTER — Other Ambulatory Visit: Payer: Self-pay | Admitting: Podiatry

## 2019-06-09 DIAGNOSIS — M5136 Other intervertebral disc degeneration, lumbar region: Secondary | ICD-10-CM | POA: Diagnosis not present

## 2019-06-09 DIAGNOSIS — M47816 Spondylosis without myelopathy or radiculopathy, lumbar region: Secondary | ICD-10-CM | POA: Diagnosis not present

## 2019-06-09 DIAGNOSIS — M5030 Other cervical disc degeneration, unspecified cervical region: Secondary | ICD-10-CM | POA: Diagnosis not present

## 2019-06-09 DIAGNOSIS — Z79899 Other long term (current) drug therapy: Secondary | ICD-10-CM | POA: Diagnosis not present

## 2019-06-09 DIAGNOSIS — M130 Polyarthritis, unspecified: Secondary | ICD-10-CM | POA: Diagnosis not present

## 2019-06-09 DIAGNOSIS — Z5181 Encounter for therapeutic drug level monitoring: Secondary | ICD-10-CM | POA: Diagnosis not present

## 2019-06-12 IMAGING — MG DIGITAL DIAGNOSTIC BILATERAL MAMMOGRAM WITH TOMO AND CAD
8 series · 8 of 24 positions shown · non-contrast
Comparison: Previous exam(s).

CLINICAL DATA: History of cyst aspiration of the left 2 o'clock
breast. Patient feels fullness in the same area.

EXAM:
DIGITAL DIAGNOSTIC BILATERAL MAMMOGRAM WITH CAD AND TOMO
ULTRASOUND LEFT BREAST

[L CC synth-2D]
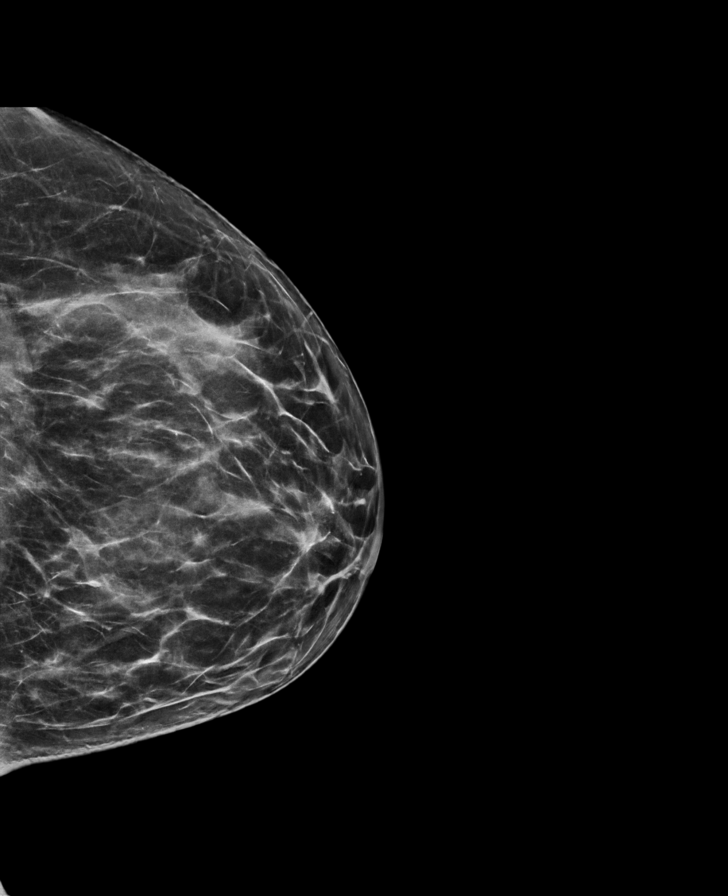

[R CC synth-2D]
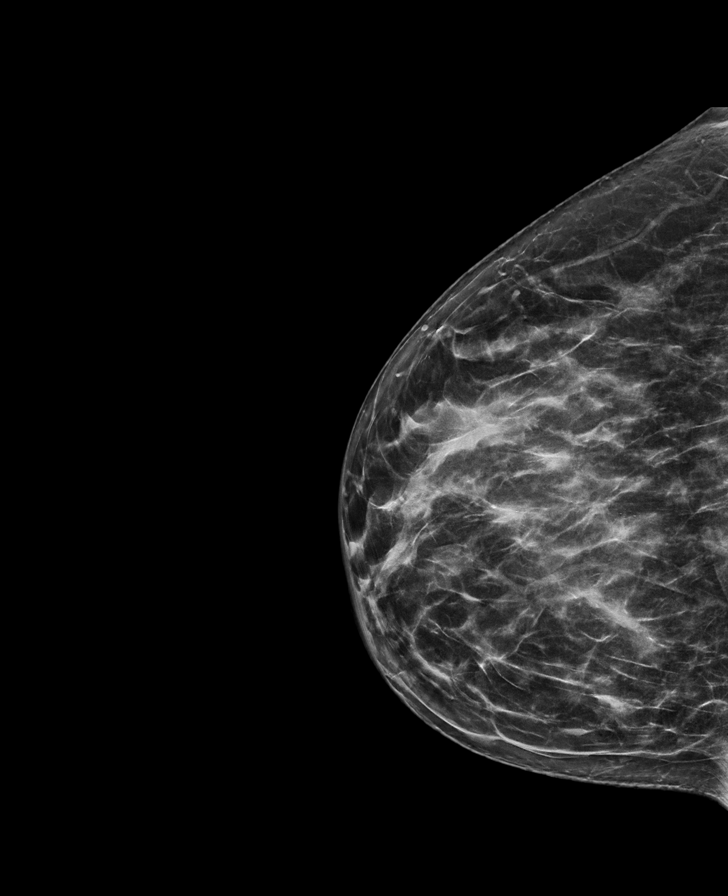

[R MLO synth-2D]
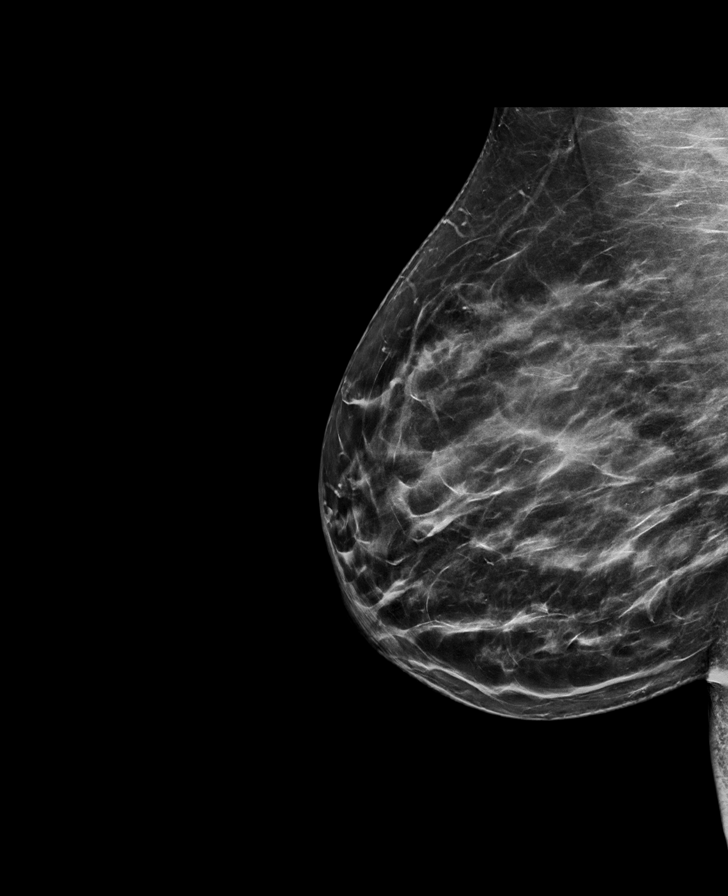

[L MLO synth-2D]
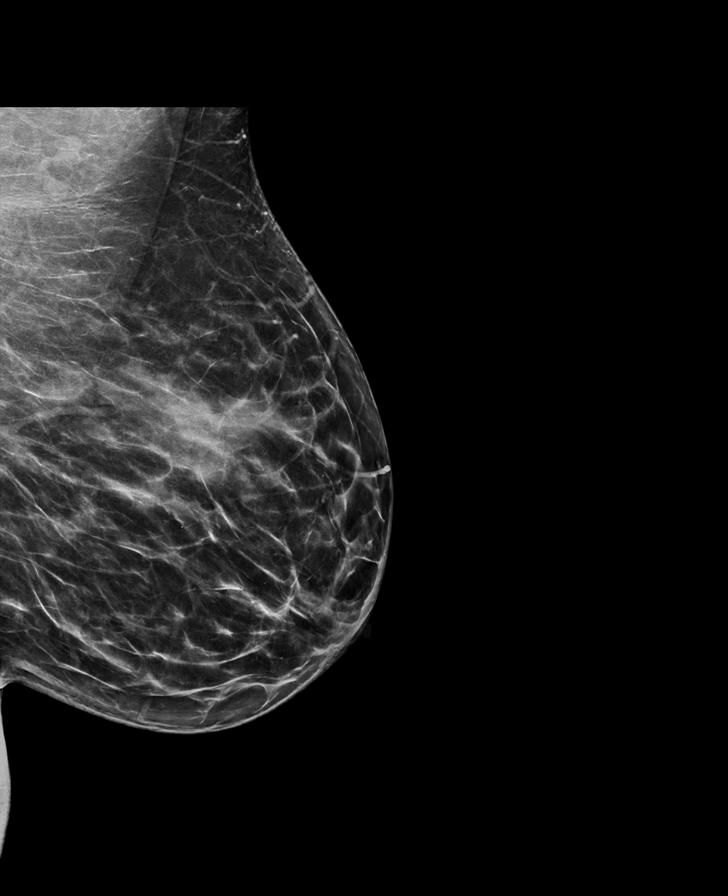

[L CC tomo · tomo slice 33/64.0]
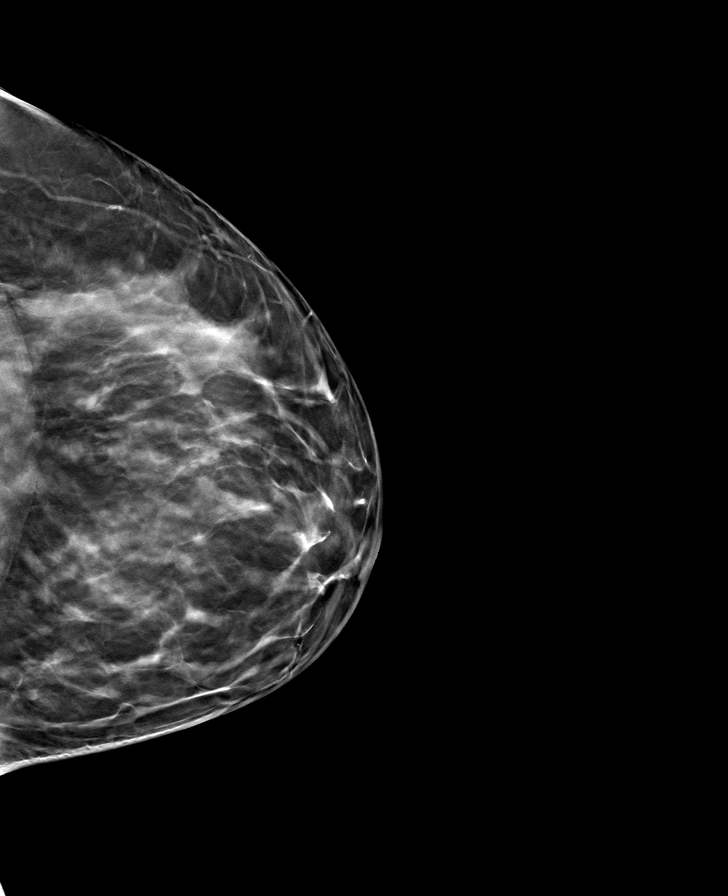

[R MLO tomo · tomo slice 35/69.0]
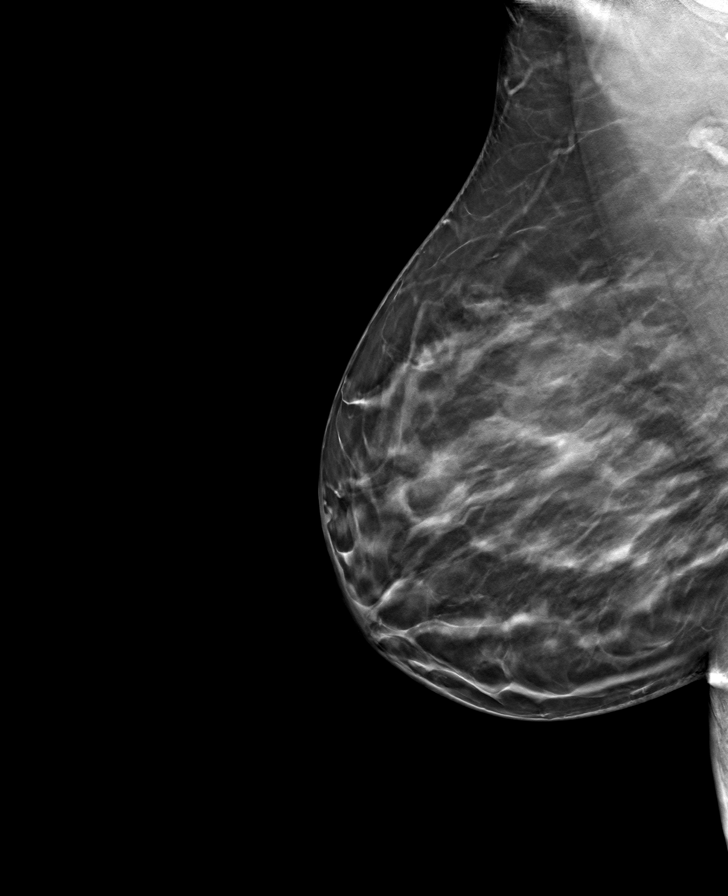

[R CC tomo · tomo slice 33/65.0]
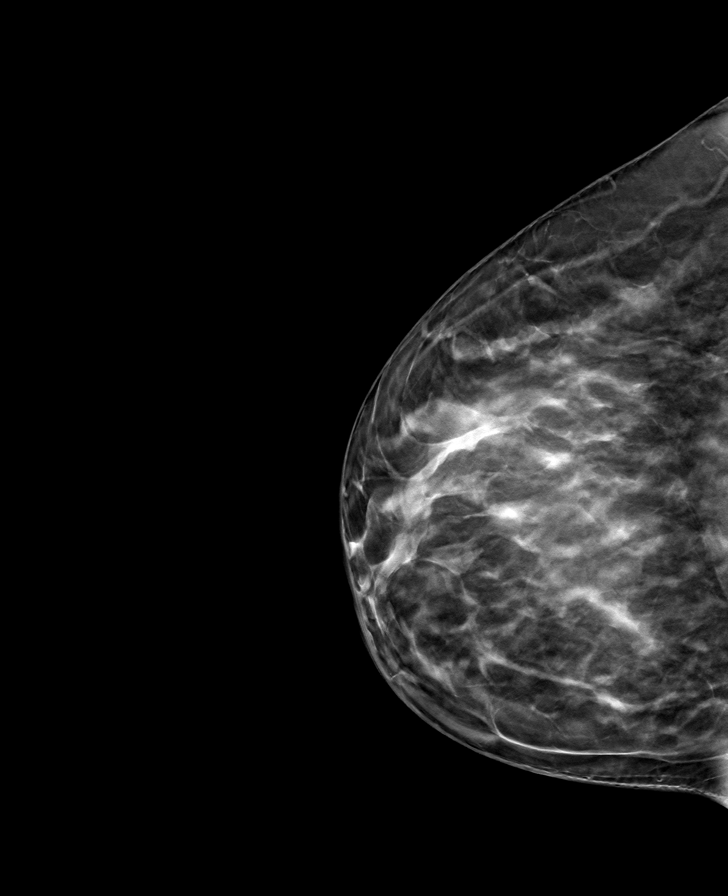

[L MLO tomo · tomo slice 35/69.0]
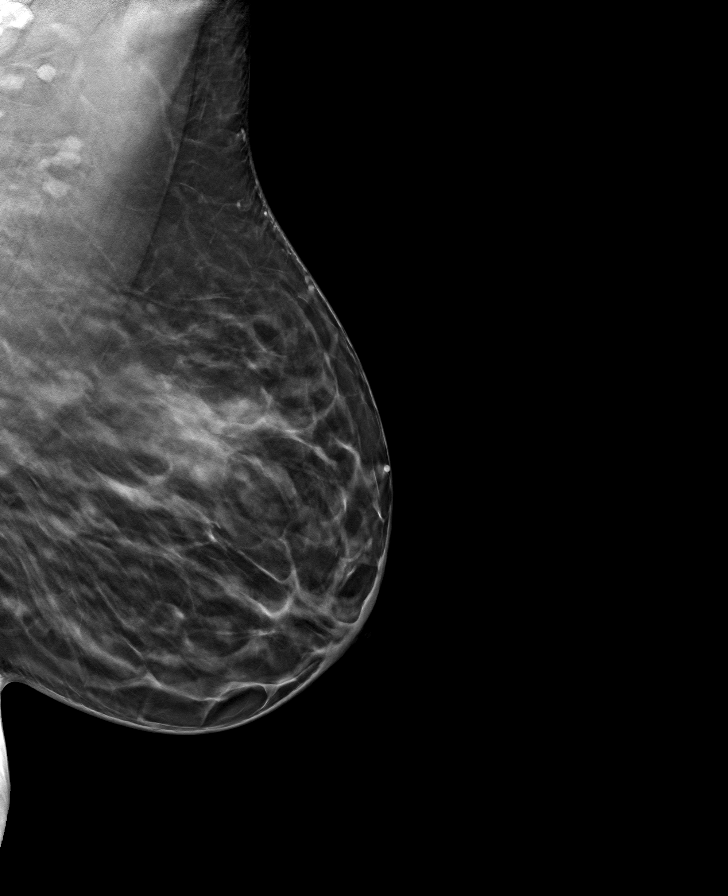

[8 of 24 positions shown; findings below may reference images not displayed]

ACR Breast Density Category c: The breast tissue is heterogeneously
dense, which may obscure small masses.
FINDINGS: Mammographically, there are no suspicious masses, areas of
architectural distortion or microcalcifications in either breast.
There is a few mm residual circumscribed low-density mass in the
left breast 2 o'clock, middle depth, likely residual or recurrent
breast cyst, post ultrasound-guided aspiration.

Mammographic images were processed with CAD.

On physical exam, no suspicious masses are palpated.

Targeted ultrasound is performed, showing left breast 2 o'clock 10
cm from the nipple benign-appearing cluster of cysts measuring
collectively 1.8 x 0.5 x 1.8 cm. This finding corresponds to the
area of fullness described by the patient.
IMPRESSION: No mammographic evidence of malignancy in the right breast.

Left breast o'clock probably benign cluster of cysts, for which
short-term follow-up is recommended.

RECOMMENDATION:
Diagnostic ultrasound of the left breast in 6 months.

I have discussed the findings and recommendations with the patient.
Results were also provided in writing at the conclusion of the
visit. If applicable, a reminder letter will be sent to the patient
regarding the next appointment.

BI-RADS CATEGORY  3: Probably benign.

## 2019-06-15 DIAGNOSIS — Z885 Allergy status to narcotic agent status: Secondary | ICD-10-CM | POA: Diagnosis not present

## 2019-06-15 DIAGNOSIS — G43909 Migraine, unspecified, not intractable, without status migrainosus: Secondary | ICD-10-CM | POA: Diagnosis not present

## 2019-06-15 DIAGNOSIS — I10 Essential (primary) hypertension: Secondary | ICD-10-CM | POA: Diagnosis not present

## 2019-06-15 DIAGNOSIS — M47817 Spondylosis without myelopathy or radiculopathy, lumbosacral region: Secondary | ICD-10-CM | POA: Diagnosis not present

## 2019-06-15 DIAGNOSIS — E079 Disorder of thyroid, unspecified: Secondary | ICD-10-CM | POA: Diagnosis not present

## 2019-06-15 DIAGNOSIS — Z91048 Other nonmedicinal substance allergy status: Secondary | ICD-10-CM | POA: Diagnosis not present

## 2019-06-15 DIAGNOSIS — M47816 Spondylosis without myelopathy or radiculopathy, lumbar region: Secondary | ICD-10-CM | POA: Diagnosis not present

## 2019-06-15 DIAGNOSIS — J45909 Unspecified asthma, uncomplicated: Secondary | ICD-10-CM | POA: Diagnosis not present

## 2019-06-15 DIAGNOSIS — Z79899 Other long term (current) drug therapy: Secondary | ICD-10-CM | POA: Diagnosis not present

## 2019-06-29 DIAGNOSIS — Z791 Long term (current) use of non-steroidal anti-inflammatories (NSAID): Secondary | ICD-10-CM | POA: Diagnosis not present

## 2019-06-29 DIAGNOSIS — G8929 Other chronic pain: Secondary | ICD-10-CM | POA: Diagnosis not present

## 2019-06-29 DIAGNOSIS — Z91048 Other nonmedicinal substance allergy status: Secondary | ICD-10-CM | POA: Diagnosis not present

## 2019-06-29 DIAGNOSIS — I10 Essential (primary) hypertension: Secondary | ICD-10-CM | POA: Diagnosis not present

## 2019-06-29 DIAGNOSIS — E079 Disorder of thyroid, unspecified: Secondary | ICD-10-CM | POA: Diagnosis not present

## 2019-06-29 DIAGNOSIS — Z885 Allergy status to narcotic agent status: Secondary | ICD-10-CM | POA: Diagnosis not present

## 2019-06-29 DIAGNOSIS — Z808 Family history of malignant neoplasm of other organs or systems: Secondary | ICD-10-CM | POA: Diagnosis not present

## 2019-06-29 DIAGNOSIS — Z8249 Family history of ischemic heart disease and other diseases of the circulatory system: Secondary | ICD-10-CM | POA: Diagnosis not present

## 2019-06-29 DIAGNOSIS — Z79899 Other long term (current) drug therapy: Secondary | ICD-10-CM | POA: Diagnosis not present

## 2019-06-29 DIAGNOSIS — G43909 Migraine, unspecified, not intractable, without status migrainosus: Secondary | ICD-10-CM | POA: Diagnosis not present

## 2019-06-29 DIAGNOSIS — Z833 Family history of diabetes mellitus: Secondary | ICD-10-CM | POA: Diagnosis not present

## 2019-06-29 DIAGNOSIS — J45909 Unspecified asthma, uncomplicated: Secondary | ICD-10-CM | POA: Diagnosis not present

## 2019-06-29 DIAGNOSIS — M47816 Spondylosis without myelopathy or radiculopathy, lumbar region: Secondary | ICD-10-CM | POA: Diagnosis not present

## 2019-06-29 DIAGNOSIS — Z7951 Long term (current) use of inhaled steroids: Secondary | ICD-10-CM | POA: Diagnosis not present

## 2019-07-08 NOTE — Progress Notes (Signed)
Virtual Visit via Video Note  I connected with Emily [name] on 07/09/19 at  7:45 AM EDT by a video enabled telemedicine application and verified that I am speaking with the correct person using two identifiers.   I discussed the limitations of evaluation and management by telemedicine and the availability of in person appointments. The patient expressed understanding and agreed to proceed.  The patient is currently at home and I am in the office.    No referring provider.    History of Present Illness: This visit is an acute visit for cold symptoms. She has asthma.  Her daughter and son work at a nursing home and have had a similar symptoms.  They were evaluated for this and they were diagnosed with a viral upper respiratory infection.  Her symptoms started one week ago.   She is experiencing significant nasal congestion, postnasal drip and a cough related to the drainage.  She is able to bring up some mucus at times, but it has all been clear.  She has had some chest tightness and wheezing.  The chest tightness has improved since her symptoms began.  She states fatigue.  She denies any fevers or chills, headaches, shortness of breath and diarrhea.  She has been using her albuterol inhaler and nebulizer treatments and they are helping.  She is also been using a nasal decongestant.    Review of Systems  Constitutional: Positive for malaise/fatigue. Negative for fever.  HENT: Positive for congestion. Negative for ear pain, sinus pain and sore throat.        PND  Respiratory: Positive for cough (productive - clear) and wheezing. Negative for shortness of breath.        Tightness in chest - improved since her symptoms started  Cardiovascular: Negative for chest pain.  Gastrointestinal: Negative for diarrhea and nausea.  Musculoskeletal: Negative for myalgias.  Neurological: Negative for dizziness and headaches.        Social History   Socioeconomic History  . Marital status:  Divorced    Spouse name: Not on file  . Number of children: 2  . Years of education: Not on file  . Highest education level: Not on file  Occupational History  . Occupation: disabled  Social Needs  . Financial resource strain: Not hard at all  . Food insecurity    Worry: Never true    Inability: Never true  . Transportation needs    Medical: No    Non-medical: No  Tobacco Use  . Smoking status: Current Every Day Smoker    Packs/day: 1.00    Years: 18.00    Pack years: 18.00    Types: Cigarettes  . Smokeless tobacco: Never Used  . Tobacco comment: [facility] smoking cessation class resource provided 07/29/18  Substance and Sexual Activity  . Alcohol use: No    Alcohol/week: 0.0 standard drinks  . Drug use: No  . Sexual activity: Yes  Lifestyle  . Physical activity    Days per week: 0 days    Minutes per session: 0 min  . Stress: Only a little  Relationships  . Social connections    Talks on phone: More than three times a week    Gets together: More than three times a week    Attends religious service: Never    Active member of club or organization: Yes    Attends meetings of clubs or organizations: More than 4 times per year    Relationship status: Living with partner  Other Topics Concern  . Not on file  Social History Narrative   Lives w/fiance and daughter.   On disability.     Observations/Objective: Appears well in NAD Coughing throughout the appointment. Breathing without respiratory distress, no audible wheezing  Assessment and Plan:  See Problem List for Assessment and Plan of chronic medical problems.   Follow Up Instructions:    I discussed the assessment and treatment plan with the patient. The patient was provided an opportunity to ask questions and all were answered. The patient agreed with the plan and demonstrated an understanding of the instructions.   The patient was advised to call back or seek an in-person evaluation if the symptoms worsen or  if the condition fails to improve as anticipated.    Binnie Rail, MD

## 2019-07-09 ENCOUNTER — Encounter: Payer: Self-pay | Admitting: Internal Medicine

## 2019-07-09 ENCOUNTER — Ambulatory Visit (INDEPENDENT_AMBULATORY_CARE_PROVIDER_SITE_OTHER): Payer: Medicare Other | Admitting: Internal Medicine

## 2019-07-09 DIAGNOSIS — J4541 Moderate persistent asthma with (acute) exacerbation: Secondary | ICD-10-CM

## 2019-07-09 DIAGNOSIS — J069 Acute upper respiratory infection, unspecified: Secondary | ICD-10-CM

## 2019-07-09 DIAGNOSIS — J45901 Unspecified asthma with (acute) exacerbation: Secondary | ICD-10-CM | POA: Insufficient documentation

## 2019-07-09 MED ORDER — HYDROCOD POLST-CPM POLST ER 10-8 MG/5ML PO SUER: 5.0000 mL | Freq: Two times a day (BID) | ORAL | 0 refills | Status: DC | PRN

## 2019-07-09 MED ORDER — FLOVENT HFA 110 MCG/ACT IN AERO: 2.0000 | INHALATION_SPRAY | Freq: Two times a day (BID) | RESPIRATORY_TRACT | 1 refills | Status: DC

## 2019-07-09 MED ORDER — PROAIR HFA 108 (90 BASE) MCG/ACT IN AERS: INHALATION_SPRAY | RESPIRATORY_TRACT | 2 refills | Status: DC

## 2019-07-09 MED ORDER — PREDNISONE 10 MG PO TABS: ORAL_TABLET | ORAL | 0 refills | Status: DC

## 2019-07-09 MED ORDER — ALBUTEROL SULFATE (2.5 MG/3ML) 0.083% IN NEBU: INHALATION_SOLUTION | RESPIRATORY_TRACT | 1 refills | Status: DC

## 2019-07-09 NOTE — Assessment & Plan Note (Signed)
Asthma exacerbation secondary to viral URI Using her albuterol inhaler/nebulizer treatments-she will continue She did have an old Qvar on hand and started using that and that seemed to help-advised this is no longer on the market and will send in a prescription for Flovent Prednisone taper Prescription cough syrup Inhalers and nebulizers solution sent to pharmacy We will hold off on antibiotic since this does sound like a viral infection Advised her to call if her symptoms do not improve or if she has any questions

## 2019-07-09 NOTE — Assessment & Plan Note (Signed)
Viral URI with asthma exacerbation Her son and daughter have had similar symptoms and they were evaluated and both diagnosed with a viral infection No need for an antibiotic at this time We will treat asthma as above Symptomatic treatment, rest, fluids  She will call with any questions of her symptoms or not improving

## 2019-07-30 ENCOUNTER — Other Ambulatory Visit: Payer: Self-pay | Admitting: Internal Medicine

## 2019-07-30 DIAGNOSIS — Z1231 Encounter for screening mammogram for malignant neoplasm of breast: Secondary | ICD-10-CM

## 2019-08-03 ENCOUNTER — Telehealth: Payer: Self-pay

## 2019-08-03 DIAGNOSIS — M5412 Radiculopathy, cervical region: Secondary | ICD-10-CM | POA: Diagnosis not present

## 2019-08-03 DIAGNOSIS — N6320 Unspecified lump in the left breast, unspecified quadrant: Secondary | ICD-10-CM

## 2019-08-03 NOTE — Addendum Note (Signed)
Addended by: Pricilla Holm A on: 08/03/2019 12:00 PM   Modules accepted: Orders

## 2019-08-03 NOTE — Telephone Encounter (Signed)
Patient informed that referral has been placed.

## 2019-08-03 NOTE — Telephone Encounter (Signed)
Copied from [place] [id number]. Topic: General - Other >> Jul 30, 2019  1:17 PM Celene Kras A wrote: Reason for CRM: Pt called stating she is needing a referral for a mammogram with an ultrasound as her cyst is getting bigger in her breast on the left side. Please advise. >> Jul 30, 2019  1:42 PM Morey Hummingbird wrote: In march of 2019 mammogram suggested a repeat in 6 months, please advise

## 2019-08-03 NOTE — Telephone Encounter (Signed)
Ordered mammogram and ultrasound

## 2019-08-04 NOTE — Progress Notes (Signed)
Subjective:   Emily [name] is a 48 y.o. female who presents for Medicare Annual (Subsequent) preventive examination. I connected with patient by a telephone and verified that I am speaking with the correct person using two identifiers. Patient stated full name and DOB. Patient gave permission to continue with telephonic visit. Patient's location was at home and Nurse's location was at [place] office.  Review of Systems:   Cardiac Risk Factors include: advanced age (>61men, >107 women);hypertension Sleep patterns: has interrupted sleep, gets up 1-2 times nightly to void and sleeps hours varies nightly.    Home Safety/Smoke Alarms: Feels safe in home. Smoke alarms in place.  Living environment; residence and Firearm Safety: 1-story house/ trailer. Lives with significant other, no needs for DME, good support system Seat Belt Safety/Bike Helmet: Wears seat belt.     Objective:     Vitals: LMP 07/20/2017   There is no height or weight on file to calculate BMI.  Advanced Directives 08/05/2019 07/29/2018 09/09/2017 08/07/2017 05/09/2015 03/15/2014  Does Patient Have a Medical Advance Directive? No No No Yes No Patient does not have advance directive;Patient would not like information  Type of Advance Directive - - - [place];Living will - -  Does patient want to make changes to medical advance directive? No - Patient declined - - - - -  Would patient like information on creating a medical advance directive? - Yes (ED - Information included in AVS) No - Patient declined - - -    Tobacco Social History   Tobacco Use  Smoking Status Current Every Day Smoker  . Packs/day: 1.00  . Years: 18.00  . Pack years: 18.00  . Types: Cigarettes  Smokeless Tobacco Never Used  Tobacco Comment   [facility] smoking cessation class resource provided      Ready to quit: Not Answered Counseling given: Not Answered Comment: [facility] smoking cessation class resource provided   Past Medical  History:  Diagnosis Date  . Allergic rhinitis   . Anal fissure   . Anemia   . Anxiety   . Arthritis   . Asthma    uses inhaler as needed  . Chronic neck pain    pain mgmt in [place]  . Depression   . Gallbladder disease   . GERD (gastroesophageal reflux disease)   . Headache(784.0)   . HTN (hypertension)    BP has been normal recently per pt and not taking meds  . Hyperthyroidism   . Obesity   . Thyroid nodule    incidental on MRI neck 2014, s/p endo eval  . Uterine fibroid    Past Surgical History:  Procedure Laterality Date  . CERVICAL SPINE SURGERY     2 disc removed  . [place] Malformation  2003  . CHOLECYSTECTOMY  1993  . DILITATION & CURRETTAGE/HYSTROSCOPY WITH NOVASURE ABLATION N/A 03/15/2014   Procedure: DILATATION & CURETTAGE/HYSTEROSCOPY WITH NOVASURE ABLATION;  Surgeon: Lovenia Kim, MD;  Location: [facility];  Service: Gynecology;  Laterality: N/A;  . Laberal tear  2001   Right Shoulder  . LAPAROSCOPIC TOTAL HYSTERECTOMY  02/03/2018  . 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 WISDOM TOOTH EXTRACTION  1992   Family History  Problem Relation Age of Onset  . Breast cancer Mother        over 73 when dx  . Diverticulitis Mother   . Arthritis Mother   . Hyperlipidemia Mother   . Hypertension Mother   .  Pancreatic cancer Paternal Grandmother   . Breast cancer Paternal Aunt        x 2 pat aunts  . Irritable bowel syndrome Daughter   . Hypotension Daughter   . Alcohol abuse Son   . Alcohol abuse Paternal Uncle   . Alcohol abuse Maternal Uncle        x several  . Colon cancer Other        maternal grandmothers sister   Social History   Socioeconomic History  . Marital status: Divorced    Spouse name: Not on file  . Number of children: 2  . Years of education: Not on file  . Highest education level: Not on file  Occupational History  . Occupation: disabled  Social Needs  . Financial resource strain: Not hard at all  . Food  insecurity    Worry: Never true    Inability: Never true  . Transportation needs    Medical: No    Non-medical: No  Tobacco Use  . Smoking status: Current Every Day Smoker    Packs/day: 1.00    Years: 18.00    Pack years: 18.00    Types: Cigarettes  . Smokeless tobacco: Never Used  . Tobacco comment: [facility] smoking cessation class resource provided   Substance and Sexual Activity  . Alcohol use: No    Alcohol/week: 0.0 standard drinks  . Drug use: No  . Sexual activity: Yes  Lifestyle  . Physical activity    Days per week: 0 days    Minutes per session: 0 min  . Stress: Only a little  Relationships  . Social connections    Talks on phone: More than three times a week    Gets together: More than three times a week    Attends religious service: Never    Active member of club or organization: No    Attends meetings of clubs or organizations: Never    Relationship status: Living with partner  Other Topics Concern  . Not on file  Social History Narrative   Lives w/fiance and daughter.   On disability.    Outpatient Encounter Medications as of 08/05/2019  Medication Sig  . albuterol (PROVENTIL) (2.5 MG/3ML) 0.083% nebulizer solution USE 1 VIAL IN NEBULIZER EVERY 6 HOURS AS NEEDED FOR WHEEZING OR SHORTNESS OF BREATH  . busPIRone (BUSPAR) 7.5 MG tablet Take 1 tablet by mouth twice daily  . cyclobenzaprine (FLEXERIL) 10 MG tablet Take 10 mg by mouth 3 (three) times daily as needed for muscle spasms.  . diclofenac sodium (VOLTAREN) 1 % GEL Apply 2 g topically 3 (three) times daily as needed (for pain).  Marland Kitchen dicyclomine (BENTYL) 20 MG tablet Take 20 mg by mouth 3 (three) times daily.  Marland Kitchen DILT-XR 120 MG 24 hr capsule Take 1 capsule by mouth once daily  . escitalopram (LEXAPRO) 20 MG tablet Take 1 tablet (20 mg total) by mouth daily.  . famotidine (PEPCID) 20 MG tablet Take 1 tablet (20 mg total) by mouth at bedtime.  . fluticasone (FLONASE) 50 MCG/ACT nasal spray Use 2 spray(s) in each  nostril once daily  . fluticasone (FLOVENT HFA) 110 MCG/ACT inhaler Inhale 2 puffs into the lungs 2 (two) times daily.  Marland Kitchen gabapentin (NEURONTIN) 300 MG capsule Take 1 capsule by mouth daily.  Marland Kitchen loratadine (CLARITIN) 10 MG tablet TAKE 1 TABLET BY MOUTH ONCE DAILY  . losartan (COZAAR) 50 MG tablet Take 1 tablet by mouth once daily  . meloxicam (MOBIC) 15 MG tablet  Take 1 tablet by mouth once daily  . metaxalone (SKELAXIN) 800 MG tablet Take 800 mg by mouth 3 (three) times daily.  . montelukast (SINGULAIR) 10 MG tablet Take 1 tablet (10 mg total) by mouth at bedtime.  . Olopatadine HCl 0.2 % SOLN Apply 1-2 drops to eye 2 (two) times daily as needed (itching, running eyes).  . ondansetron (ZOFRAN) 4 MG tablet Take 1 tablet (4 mg total) by mouth every 6 (six) hours.  . Oxycodone HCl 10 MG TABS Take 1 tablet by mouth every 8 (eight) hours as needed (for pain).   Marland Kitchen PROAIR HFA 108 (90 Base) MCG/ACT inhaler INHALE 2 PUFFS INTO LUNGS EVERY 6 HOURS AS NEEDED FOR WHEEZING OR SHORTNESS OF BREATH  . PROCTOZONE-HC 2.5 % rectal cream APPLY CREAM TOPICALLY RECTALLY 2 TIMES DAILY  . promethazine (PHENERGAN) 12.5 MG tablet Take 12.5 mg by mouth every 6 (six) hours as needed for nausea or vomiting.  . SENNA-S 8.6-50 MG tablet TAKE 1 TABLET BY MOUTH ONCE DAILY  . tiZANidine (ZANAFLEX) 4 MG tablet Take 4 mg by mouth every 8 (eight) hours as needed for muscle spasms.   . [DISCONTINUED] Olopatadine HCl 0.2 % SOLN Apply 1-2 drops to eye 2 (two) times daily as needed (itching, running eyes).  . busPIRone (BUSPAR) 5 MG tablet   . [DISCONTINUED] busPIRone (BUSPAR) 7.5 MG tablet TAKE 1 TABLET BY MOUTH TWICE DAILY  . [DISCONTINUED] chlorpheniramine-HYDROcodone (TUSSIONEX PENNKINETIC ER) 10-8 MG/5ML SUER Take 5 mLs by mouth every 12 (twelve) hours as needed for cough. (Patient not taking: Reported on 08/05/2019)  . [DISCONTINUED] DILT-XR 120 MG 24 hr capsule Take 1 capsule by mouth once daily  . [DISCONTINUED] fluticasone  (FLONASE) 50 MCG/ACT nasal spray Use 2 spray(s) in each nostril once daily  . [DISCONTINUED] losartan (COZAAR) 50 MG tablet Take 1 tablet by mouth once daily  . [DISCONTINUED] predniSONE (DELTASONE) 10 MG tablet Take 4 tabs po qd x 2 days, then 3 tabs po qd x 2 days, then 2 tabs po qd x 2 days, then 1 tab po qd x 2 days (Patient not taking: Reported on 08/05/2019)   No facility-administered encounter medications on file as of 08/05/2019.     Activities of Daily Living In your present state of health, do you have any difficulty performing the following activities: 08/05/2019  Hearing? N  Vision? N  Difficulty concentrating or making decisions? N  Walking or climbing stairs? N  Dressing or bathing? N  Doing errands, shopping? N  Preparing Food and eating ? N  Using the Toilet? N  In the past six months, have you accidently leaked urine? N  Do you have problems with loss of bowel control? N  Managing your Medications? N  Managing your Finances? N  Housekeeping or managing your Housekeeping? N  Some recent data might be hidden    Patient Care Team: Hoyt Koch, MD as PCP - General (Internal Medicine) Zonia Kief, MD (Rehabilitation) Renato Shin, MD (Endocrinology) Brien Few, MD (Obstetrics and Gynecology)    Assessment:   This is a routine wellness examination for Emily [name]. Physical assessment deferred to PCP.  Exercise Activities and Dietary recommendations Current Exercise Habits: The patient does not participate in regular exercise at present([employer] senior exercise TV program resource provided), Exercise limited by: orthopedic condition(s)  Diet (meal preparation, eat out, water intake, caffeinated beverages, dairy products, fruits and vegetables): in general, a "healthy" diet  , on average, 1 meals per day   Reviewed heart  healthy diet. Encouraged patient to increase daily water and healthy fluid intake.  Discussed supplementing with Ensure and/or to make  smoothies to supplement nutrition.       Goals    . Patient Stated     I want to go fishing as much as possible. Enjoy nature, love family and worship God. Decrease the amount of soda I drink and increase the amount of water I drink.        Fall Risk Fall Risk  08/05/2019 07/29/2018 05/09/2015 11/04/2013  Falls in the past year? 0 No No No  Number falls in past yr: 0 - - -  Injury with Fall? 0 - - -    Depression Screen PHQ 2/9 Scores 08/05/2019 07/29/2018 06/17/2018 05/09/2015  PHQ - 2 Score 3 2 3 3   PHQ- 9 Score 13 11 14 10      Cognitive Function        Ad8 score reviewed for issues:  Issues making decisions: no  Less interest in hobbies / activities: no  Repeats questions, stories (family complaining): no  Trouble using ordinary gadgets (microwave, computer, phone):no  Forgets the month or year: no  Mismanaging finances: no  Remembering appts: no  Daily problems with thinking and/or memory: no Ad8 score is= 0  Immunization History  Administered Date(s) Administered  . Influenza,inj,Quad PF,6+ Mos 11/04/2013, 12/16/2015, 08/22/2016, 01/01/2018, 07/29/2018  . Td 03/22/2010   Screening Tests Health Maintenance  Topic Date Due  . HIV Screening  05/02/1986  . INFLUENZA VACCINE  02/18/2020 (Originally 06/20/2019)  . TETANUS/TDAP  03/22/2020      Plan:     Reviewed health maintenance screenings with patient today and relevant education, vaccines, and/or referrals were provided.   I have personally reviewed and noted the following in the patient's chart:   . Medical and social history . Use of alcohol, tobacco or illicit drugs  . Current medications and supplements . Functional ability and status . Nutritional status . Physical activity . Advanced directives . List of other physicians . Screenings to include cognitive, depression, and falls . Referrals and appointments  In addition, I have reviewed and discussed with patient certain preventive  protocols, quality metrics, and best practice recommendations. A written personalized care plan for preventive services as well as general preventive health recommendations were provided to patient.     Michiel Cowboy, RN  08/05/2019

## 2019-08-05 ENCOUNTER — Other Ambulatory Visit: Payer: Self-pay | Admitting: Internal Medicine

## 2019-08-05 ENCOUNTER — Ambulatory Visit (INDEPENDENT_AMBULATORY_CARE_PROVIDER_SITE_OTHER): Payer: Medicare Other | Admitting: *Deleted

## 2019-08-05 DIAGNOSIS — F418 Other specified anxiety disorders: Secondary | ICD-10-CM

## 2019-08-05 DIAGNOSIS — Z0000 Encounter for general adult medical examination without abnormal findings: Secondary | ICD-10-CM | POA: Diagnosis not present

## 2019-08-05 MED ORDER — OLOPATADINE HCL 0.2 % OP SOLN: 1.0000 [drp] | Freq: Two times a day (BID) | OPHTHALMIC | 0 refills | Status: DC | PRN

## 2019-08-05 NOTE — Patient Instructions (Addendum)
If you cannot attend class in person, you can still exercise at home. Video taped versions of [employer] classes are shown on [employer] (GTN) at 8 am and 1 pm Mondays through Fridays. You can also purchase a copy of the [employer] DVD by calling [place] (GTN) [employer]. GTN is available on [employer] channel 13 with a digital cable box and on [employer] channel 31. GTN is also available on [employer], channel 99. To view GTN, go to channel 99, press OK, select [place], then select GTN to start the channel.  Continue to eat heart healthy diet (full of fruits, vegetables, whole grains, lean protein, water--limit salt, fat, and sugar intake) and increase physical activity as tolerated.   [name] , Thank you for taking time to come for your Medicare Wellness Visit. I appreciate your ongoing commitment to your health goals. Please review the following plan we discussed and let me know if I can assist you in the future.   These are the goals we discussed: Goals    . Patient Stated     I want to go fishing as much as possible. Enjoy nature, love family and worship God. Decrease the amount of soda I drink and increase the amount of water I drink.        This is a list of the screening recommended for you and due dates:  Health Maintenance  Topic Date Due  . HIV Screening  05/02/1986  . Flu Shot  02/18/2020*  . Tetanus Vaccine  03/22/2020  *Topic was postponed. The date shown is not the original due date.    Health Maintenance, Female Adopting a healthy lifestyle and getting preventive care are important in promoting health and wellness. Ask your health care provider about:  The right schedule for you to have regular tests and exams.  Things you can do on your own to prevent diseases and keep yourself healthy. What should I know about diet, weight, and exercise? Eat a healthy diet   Eat a diet that includes plenty of vegetables, fruits,  low-fat dairy products, and lean protein.  Do not eat a lot of foods that are high in solid fats, added sugars, or sodium. Maintain a healthy weight Body mass index (BMI) is used to identify weight problems. It estimates body fat based on height and weight. Your health care provider can help determine your BMI and help you achieve or maintain a healthy weight. Get regular exercise Get regular exercise. This is one of the most important things you can do for your health. Most adults should:  Exercise for at least 150 minutes each week. The exercise should increase your heart rate and make you sweat (moderate-intensity exercise).  Do strengthening exercises at least twice a week. This is in addition to the moderate-intensity exercise.  Spend less time sitting. Even light physical activity can be beneficial. Watch cholesterol and blood lipids Have your blood tested for lipids and cholesterol at 48 years of age, then have this test every 5 years. Have your cholesterol levels checked more often if:  Your lipid or cholesterol levels are high.  You are older than 48 years of age.  You are at high risk for heart disease. What should I know about cancer screening? Depending on your health history and family history, you may need to have cancer screening at various ages. This may include screening for:  Breast cancer.  Cervical cancer.  Colorectal cancer.  Skin cancer.  Lung cancer.  What should I know about heart disease, diabetes, and high blood pressure? Blood pressure and heart disease  High blood pressure causes heart disease and increases the risk of stroke. This is more likely to develop in people who have high blood pressure readings, are of African descent, or are overweight.  Have your blood pressure checked: ? Every 3-5 years if you are 100-33 years of age. ? Every year if you are 41 years old or older. Diabetes Have regular diabetes screenings. This checks your fasting  blood sugar level. Have the screening done:  Once every three years after age 36 if you are at a normal weight and have a low risk for diabetes.  More often and at a younger age if you are overweight or have a high risk for diabetes. What should I know about preventing infection? Hepatitis B If you have a higher risk for hepatitis B, you should be screened for this virus. Talk with your health care provider to find out if you are at risk for hepatitis B infection. Hepatitis C Testing is recommended for:  Everyone born from [birth year] through 1965.  Anyone with known risk factors for hepatitis C. Sexually transmitted infections (STIs)  Get screened for STIs, including gonorrhea and chlamydia, if: ? You are sexually active and are younger than 48 years of age. ? You are older than 48 years of age and your health care provider tells you that you are at risk for this type of infection. ? Your sexual activity has changed since you were last screened, and you are at increased risk for chlamydia or gonorrhea. Ask your health care provider if you are at risk.  Ask your health care provider about whether you are at high risk for HIV. Your health care provider may recommend a prescription medicine to help prevent HIV infection. If you choose to take medicine to prevent HIV, you should first get tested for HIV. You should then be tested every 3 months for as long as you are taking the medicine. Pregnancy  If you are about to stop having your period (premenopausal) and you may become pregnant, seek counseling before you get pregnant.  Take 400 to 800 micrograms (mcg) of folic acid every day if you become pregnant.  Ask for birth control (contraception) if you want to prevent pregnancy. Osteoporosis and menopause Osteoporosis is a disease in which the bones lose minerals and strength with aging. This can result in bone fractures. If you are 37 years old or older, or if you are at risk for osteoporosis and  fractures, ask your health care provider if you should:  Be screened for bone loss.  Take a calcium or vitamin D supplement to lower your risk of fractures.  Be given hormone replacement therapy (HRT) to treat symptoms of menopause. Follow these instructions at home: Lifestyle  Do not use any products that contain nicotine or tobacco, such as cigarettes, e-cigarettes, and chewing tobacco. If you need help quitting, ask your health care provider.  Do not use street drugs.  Do not share needles.  Ask your health care provider for help if you need support or information about quitting drugs. Alcohol use  Do not drink alcohol if: ? Your health care provider tells you not to drink. ? You are pregnant, may be pregnant, or are planning to become pregnant.  If you drink alcohol: ? Limit how much you use to 0-1 drink a day. ? Limit intake if you are breastfeeding.  Be aware  of how much alcohol is in your drink. In the [place] one drink equals one 12 oz bottle of beer (355 mL), one 5 oz glass of Lindy Pennisi (148 mL), or one 1 oz glass of hard liquor (44 mL). General instructions  Schedule regular health, dental, and eye exams.  Stay current with your vaccines.  Tell your health care provider if: ? You often feel depressed. ? You have ever been abused or do not feel safe at home. Summary  Adopting a healthy lifestyle and getting preventive care are important in promoting health and wellness.  Follow your health care provider's instructions about healthy diet, exercising, and getting tested or screened for diseases.  Follow your health care provider's instructions on monitoring your cholesterol and blood pressure. This information is not intended to replace advice given to you by your health care provider. Make sure you discuss any questions you have with your health care provider. Document Released: 05/21/2011 Document Revised: 10/29/2018 Document Reviewed: 10/29/2018 Elsevier Patient  Education  2020 [employer].

## 2019-08-06 NOTE — Progress Notes (Signed)
Medical screening examination/treatment/procedure(s) were performed by non-physician practitioner and as supervising physician I was immediately available for consultation/collaboration. I agree with above. Aviv Lengacher A Verbie Babic, MD 

## 2019-08-10 ENCOUNTER — Other Ambulatory Visit: Payer: Self-pay | Admitting: Internal Medicine

## 2019-08-10 ENCOUNTER — Ambulatory Visit
Admission: RE | Admit: 2019-08-10 | Discharge: 2019-08-10 | Disposition: A | Discharge status: Home / Self Care | Payer: Medicare Other | Source: Ambulatory Visit | Attending: Internal Medicine | Admitting: Internal Medicine

## 2019-08-10 ENCOUNTER — Other Ambulatory Visit: Payer: Self-pay

## 2019-08-10 ENCOUNTER — Ambulatory Visit (INDEPENDENT_AMBULATORY_CARE_PROVIDER_SITE_OTHER): Payer: Medicare Other

## 2019-08-10 DIAGNOSIS — N6320 Unspecified lump in the left breast, unspecified quadrant: Secondary | ICD-10-CM

## 2019-08-10 DIAGNOSIS — Z1231 Encounter for screening mammogram for malignant neoplasm of breast: Secondary | ICD-10-CM

## 2019-08-10 DIAGNOSIS — Z23 Encounter for immunization: Secondary | ICD-10-CM

## 2019-08-10 DIAGNOSIS — N644 Mastodynia: Secondary | ICD-10-CM | POA: Diagnosis not present

## 2019-08-10 DIAGNOSIS — R921 Mammographic calcification found on diagnostic imaging of breast: Secondary | ICD-10-CM

## 2019-08-12 ENCOUNTER — Ambulatory Visit
Admission: RE | Admit: 2019-08-12 | Discharge: 2019-08-12 | Disposition: A | Discharge status: Home / Self Care | Payer: Medicare Other | Source: Ambulatory Visit | Attending: Internal Medicine | Admitting: Internal Medicine

## 2019-08-12 ENCOUNTER — Other Ambulatory Visit: Payer: Self-pay

## 2019-08-12 ENCOUNTER — Other Ambulatory Visit: Payer: Self-pay | Admitting: Diagnostic Radiology

## 2019-08-12 DIAGNOSIS — R921 Mammographic calcification found on diagnostic imaging of breast: Secondary | ICD-10-CM

## 2019-08-12 DIAGNOSIS — N6091 Unspecified benign mammary dysplasia of right breast: Secondary | ICD-10-CM | POA: Diagnosis not present

## 2019-08-24 ENCOUNTER — Ambulatory Visit: Payer: Self-pay | Admitting: Surgery

## 2019-08-24 DIAGNOSIS — N6081 Other benign mammary dysplasias of right breast: Secondary | ICD-10-CM | POA: Diagnosis not present

## 2019-08-24 DIAGNOSIS — N6091 Unspecified benign mammary dysplasia of right breast: Secondary | ICD-10-CM

## 2019-08-24 NOTE — H&P (Signed)
Emily [name] Documented: 08/24/2019 9:50 AM Location: [facility] Patient #: [id number] DOB: 07/08/1971 Married / Language: English / Race: Black or African American Female  History of Present Illness Emily Moores A. [name] MD; 08/24/2019 10:57 AM) Patient words: Patient is sent at the request of Dr. Shon Hale for an abnormal right breast mammogram. The patient was complaining of left breast pain. She underwent screening and subsequent diagnostic mammography which showed a cluster of right breast microcalcifications that were felt to be atypical measuring 12 mm in the upper central right breast. Core biopsy showed atypical ductal hyperplasia. She has 2 first-degree relatives with a history of breast cancer that included her mother. Her mother was in her 17s. She denies any breast pain, nipple discharge or breast mass.       Breast, right, needle core biopsy, lower outer - ATYPICAL DUCTAL HYPERPLASIA WITH CALCIFICATIONS. - SEE COMMENT.  The patient presents with new pinprick pain in the left breast at a site of previous cysts.  EXAM: DIGITAL DIAGNOSTIC BILATERAL MAMMOGRAM WITH CAD AND TOMO  ULTRASOUND LEFT BREAST  COMPARISON: Previous exam(s).  ACR Breast Density Category c: The breast tissue is heterogeneously dense, which may obscure small masses.  FINDINGS: There are new calcifications in a linear pattern seen in the left breast, slightly lateral to the nipple on the CC view at a posterior depth. These calcifications span up to 12 mm. No suspicious findings are otherwise seen in either breast.  Mammographic images were processed with CAD.  On physical exam, no suspicious lumps are identified.  Targeted ultrasound is performed, showing no sonographic correlate to the patient's pinprick pain in the left breast.  IMPRESSION: No cause for the patient's left-sided symptoms. New right breast calcifications as above.  RECOMMENDATION: Recommend stereotactic  biopsy of the new right breast calcifications.  I have discussed the findings and recommendations with the patient. If applicable, a reminder letter will be sent to the patient regarding the next appointment.  BI-RADS CATEGORY 4: Suspicious.   Electronically Signed By: Dorise Bullion III M.D On: 08/10/2019 14:06.  The patient is a 48 year old female.   Past Surgical History [name], CMA; 08/24/2019 9:50 AM) Breast Biopsy Right. Colon Polyp Removal - Colonoscopy Gallbladder Surgery - Laparoscopic Hysterectomy (not due to cancer) - Partial Spinal Surgery - Neck  Diagnostic Studies History [name], CMA; 08/24/2019 9:50 AM) Colonoscopy 1-5 years ago Mammogram within last year Pap Smear 1-5 years ago  Allergies [name], CMA; 08/24/2019 9:52 AM) Morphine Derivatives Allergies Reconciled  Medication History ([name], CMA; 08/24/2019 9:56 AM) oxyCODONE HCl (10MG  Tablet, Oral) Active. busPIRone HCl (7.5MG  Tablet, Oral) Active. Diclofenac Sodium (1% Gel, Transdermal) Active. Cyclobenzaprine HCl (10MG  Tablet, Oral) Active. Escitalopram Oxalate (20MG  Tablet, Oral) Active. Flovent HFA (110MCG/ACT Aerosol, Inhalation) Active. Fluticasone Propionate (50MCG/ACT Suspension, Nasal) Active. Gabapentin (300MG  Capsule, Oral) Active. Losartan Potassium (50MG  Tablet, Oral) Active. Meloxicam (15MG  Tablet, Oral) Active. Olopatadine HCl (0.2% Solution, Ophthalmic) Active. ProAir HFA (108 (90 Base)MCG/ACT Aerosol Soln, Inhalation) Active. Loratadine (10MG  Tablet, Oral) Active. Stool Softener/Laxative (50-8.6MG  Tablet, Oral) Active. Famotidine (20MG  Tablet, Oral) Active. Dilt-XR (120MG  Capsule ER 24HR, Oral) Active. Albuterol Sulfate ((2.5 MG/3ML)0.083% Nebulized Soln, Inhalation) Active. Doxycycline Hyclate (100MG  Tablet, Oral) Active. Medications Reconciled  Social History [name], CMA; 08/24/2019 9:50 AM) Alcohol use  Occasional alcohol use. Caffeine use Carbonated beverages, Coffee, Tea. No drug use Tobacco use Current every day smoker.  Family History [name], [place]; 08/24/2019 9:50 AM) Alcohol Abuse Family Members In [place], Son.  Arthritis Family Members In General, Mother. Bleeding disorder Daughter. Depression Daughter, Family Members In General. Hypertension Father, Mother. Ischemic Bowel Disease Daughter. Malignant Neoplasm Of Pancreas Family Members In General. Migraine Headache Daughter, Son. Respiratory Condition Daughter, Mother.  Pregnancy / Birth History [name], [place]; 08/24/2019 9:50 AM) Age at menarche 87 years. Contraceptive History Oral contraceptives. Gravida 2 Length (months) of breastfeeding 7-12  Other Problems [name], CMA; 08/24/2019 9:50 AM) Anxiety Disorder Arthritis Asthma Back Pain Cholelithiasis Depression Gastroesophageal Reflux Disease Heart murmur Hemorrhoids Migraine Headache Thyroid Disease     Review of Systems ([name]; 08/24/2019 9:50 AM) General Present- Fatigue and Night Sweats. Not Present- Appetite Loss, Chills, Fever, Weight Gain and Weight Loss. Skin Not Present- Change in Wart/Mole, Dryness, Hives, Jaundice, New Lesions, Non-Healing Wounds, Rash and Ulcer. HEENT Present- Seasonal Allergies. Not Present- Earache, Hearing Loss, Hoarseness, Nose Bleed, Oral Ulcers, Ringing in the Ears, Sinus Pain, Sore Throat, Visual Disturbances, Wears glasses/contact lenses and Yellow Eyes. Respiratory Not Present- Bloody sputum, Chronic Cough, Difficulty Breathing, Snoring and Wheezing. Breast Not Present- Breast Mass, Breast Pain, Nipple Discharge and Skin Changes. Cardiovascular Not Present- Chest Pain, Difficulty Breathing Lying Down, Leg Cramps, Palpitations, Rapid Heart Rate, Shortness of Breath and Swelling of Extremities. Gastrointestinal Present- Bloating, Hemorrhoids and Indigestion. Not Present-  Abdominal Pain, Bloody Stool, Change in Bowel Habits, Chronic diarrhea, Constipation, Difficulty Swallowing, Excessive gas, Gets full quickly at meals, Nausea, Rectal Pain and Vomiting. Neurological Present- Headaches. Not Present- Decreased Memory, Fainting, Numbness, Seizures, Tingling, Tremor, Trouble walking and Weakness. Psychiatric Present- Anxiety and Depression. Not Present- Bipolar, Change in Sleep Pattern, Fearful and Frequent crying. Endocrine Present- Heat Intolerance and Hot flashes. Not Present- Cold Intolerance, Excessive Hunger, Hair Changes and New Diabetes. Hematology Not Present- Blood Thinners, Easy Bruising, Excessive bleeding, Gland problems, HIV and Persistent Infections.  Vitals ([name] CMA; 08/24/2019 9:57 AM) 08/24/2019 9:56 AM Weight: 184.6 lb Height: 59in Body Surface Area: 1.78 m Body Mass Index: 37.28 kg/m  Temp.: 96.53F(Temporal)  Pulse: 95 (Regular)  BP: 142/82 (Sitting, Left Arm, Standard)        Physical Exam (Korrine Sicard A. Alicya Bena MD; 08/24/2019 10:58 AM)  General Mental Status-Alert. General Appearance-Consistent with stated age. Hydration-Well hydrated. Voice-Normal.  Head and Neck Head-normocephalic, atraumatic with no lesions or palpable masses. Trachea-midline. Thyroid Gland Characteristics - normal size and consistency.  Eye Eyeball - Bilateral-Extraocular movements intact. Sclera/Conjunctiva - Bilateral-No scleral icterus.  Chest and Lung Exam Chest and lung exam reveals -quiet, even and easy respiratory effort with no use of accessory muscles and on auscultation, normal breath sounds, no adventitious sounds and normal vocal resonance. Inspection Chest Wall - Normal. Back - normal.  Breast Note: Bruising right upper breast noted with small hematoma. No masses. Left breast is normal.  Cardiovascular Cardiovascular examination reveals -normal heart sounds, regular rate and rhythm with no murmurs  and normal pedal pulses bilaterally.  Neurologic Neurologic evaluation reveals -alert and oriented x 3 with no impairment of recent or remote memory. Mental Status-Normal.  Musculoskeletal Normal Exam - Left-Upper Extremity Strength Normal and Lower Extremity Strength Normal. Normal Exam - Right-Upper Extremity Strength Normal and Lower Extremity Strength Normal.  Lymphatic Head & Neck  General Head & Neck Lymphatics: Bilateral - Description - Normal. Axillary  General Axillary Region: Bilateral - Description - Normal. Tenderness - Non Tender.    Assessment & Plan (Thompson Mckim A. 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 She  is a one and 5 chance of being operated to DCIS or invasive cancer. Discussed high risk clinic in referral to that clinic.   Risk of lumpectomy include bleeding, infection, seroma, more surgery, use of seed/wire, wound care, cosmetic deformity and the need for other treatments, death , blood clots, death. Pt agrees to proceed.    Discussed nonoperative management as well and risk reduction strategies  Current Plans Pt Education - [facility] Breast Biopsy HCI: discussed with patient and provided information. The anatomy and the physiology was discussed. The pathophysiology and natural history of the disease was discussed. Options were discussed and recommendations were made. Technique, risks, benefits, & alternatives were discussed. Risks such as stroke, heart attack, bleeding, infection, death, and other risks discussed. Questions answered. The patient agrees to proceed. Risk of seroma, cosmetic deformity, bleeding, infection, the need for further surgery, general anesthesia risk and worsening of underlying medical problems discussed. Discussed COVID risk and testing

## 2019-08-24 NOTE — H&P (View-Only) (Signed)
Emily [name] Documented: 08/24/2019 9:50 AM Location: [facility] Patient #: [id number] DOB: 1970/12/10 Married / Language: English / Race: Black or African American Female  History of Present Illness Emily Moores A. Emily Dearman MD; 08/24/2019 10:57 AM) Patient words: Patient is sent at the request of Dr. Shon [name] for an abnormal right breast mammogram. The patient was complaining of left breast pain. She underwent screening and subsequent diagnostic mammography which showed a cluster of right breast microcalcifications that were felt to be atypical measuring 12 mm in the upper central right breast. Core biopsy showed atypical ductal hyperplasia. She has 2 first-degree relatives with a history of breast cancer that included her mother. Her mother was in her 29s. She denies any breast pain, nipple discharge or breast mass.       Breast, right, needle core biopsy, lower outer - ATYPICAL DUCTAL HYPERPLASIA WITH CALCIFICATIONS. - SEE COMMENT.  The patient presents with new pinprick pain in the left breast at a site of previous cysts.  EXAM: DIGITAL DIAGNOSTIC BILATERAL MAMMOGRAM WITH CAD AND TOMO  ULTRASOUND LEFT BREAST  COMPARISON: Previous exam(s).  ACR Breast Density Category c: The breast tissue is heterogeneously dense, which may obscure small masses.  FINDINGS: There are new calcifications in a linear pattern seen in the left breast, slightly lateral to the nipple on the CC view at a posterior depth. These calcifications span up to 12 mm. No suspicious findings are otherwise seen in either breast.  Mammographic images were processed with CAD.  On physical exam, no suspicious lumps are identified.  Targeted ultrasound is performed, showing no sonographic correlate to the patient's pinprick pain in the left breast.  IMPRESSION: No cause for the patient's left-sided symptoms. New right breast calcifications as above.  RECOMMENDATION: Recommend stereotactic  biopsy of the new right breast calcifications.  I have discussed the findings and recommendations with the patient. If applicable, a reminder letter will be sent to the patient regarding the next appointment.  BI-RADS CATEGORY 4: Suspicious.   Electronically Signed By: Emily [name] M.D On: 08/10/2019 14:06.  The patient is a 48 year old female.   Past Surgical History Emily [name], CMA; 08/24/2019 9:50 AM) Breast Biopsy Right. Colon Polyp Removal - Colonoscopy Gallbladder Surgery - Laparoscopic Hysterectomy (not due to cancer) - Partial Spinal Surgery - Neck  Diagnostic Studies History Emily [name], CMA; 08/24/2019 9:50 AM) Colonoscopy 1-5 years ago Mammogram within last year Pap Smear 1-5 years ago  Allergies Emily [name], CMA; 08/24/2019 9:52 AM) Morphine Derivatives Allergies Reconciled  Medication History (Emily [name], CMA; 08/24/2019 9:56 AM) oxyCODONE HCl (10MG  Tablet, Oral) Active. busPIRone HCl (7.5MG  Tablet, Oral) Active. Diclofenac Sodium (1% Gel, Transdermal) Active. Cyclobenzaprine HCl (10MG  Tablet, Oral) Active. Escitalopram Oxalate (20MG  Tablet, Oral) Active. Flovent HFA (110MCG/ACT Aerosol, Inhalation) Active. Fluticasone Propionate (50MCG/ACT Suspension, Nasal) Active. Gabapentin (300MG  Capsule, Oral) Active. Losartan Potassium (50MG  Tablet, Oral) Active. Meloxicam (15MG  Tablet, Oral) Active. Olopatadine HCl (0.2% Solution, Ophthalmic) Active. ProAir HFA (108 (90 Base)MCG/ACT Aerosol Soln, Inhalation) Active. Loratadine (10MG  Tablet, Oral) Active. Stool Softener/Laxative (50-8.6MG  Tablet, Oral) Active. Famotidine (20MG  Tablet, Oral) Active. Dilt-XR (120MG  Capsule ER 24HR, Oral) Active. Albuterol Sulfate ((2.5 MG/3ML)0.083% Nebulized Soln, Inhalation) Active. Doxycycline Hyclate (100MG  Tablet, Oral) Active. Medications Reconciled  Social History Emily [name], CMA; 08/24/2019 9:50 AM) Alcohol use  Occasional alcohol use. Caffeine use Carbonated beverages, Coffee, Tea. No drug use Tobacco use Current every day smoker.  Family History Emily [name], Emily [name]; 08/24/2019 9:50 AM) Alcohol Abuse Family Members In [place], Son.  Arthritis Family Members In General, Mother. Bleeding disorder Daughter. Depression Daughter, Family Members In General. Hypertension Father, Mother. Ischemic Bowel Disease Daughter. Malignant Neoplasm Of Pancreas Family Members In General. Migraine Headache Daughter, Son. Respiratory Condition Daughter, Mother.  Pregnancy / Birth History Emily [name], [place]; 08/24/2019 9:50 AM) Age at menarche 25 years. Contraceptive History Oral contraceptives. Gravida 2 Length (months) of breastfeeding 7-12  Other Problems Emily [name], CMA; 08/24/2019 9:50 AM) Anxiety Disorder Arthritis Asthma Back Pain Cholelithiasis Depression Gastroesophageal Reflux Disease Heart murmur Hemorrhoids Migraine Headache Thyroid Disease     Review of Systems ([place]; 08/24/2019 9:50 AM) General Present- Fatigue and Night Sweats. Not Present- Appetite Loss, Chills, Fever, Weight Gain and Weight Loss. Skin Not Present- Change in Wart/Mole, Dryness, Hives, Jaundice, New Lesions, Non-Healing Wounds, Rash and Ulcer. HEENT Present- Seasonal Allergies. Not Present- Earache, Hearing Loss, Hoarseness, Nose Bleed, Oral Ulcers, Ringing in the Ears, Sinus Pain, Sore Throat, Visual Disturbances, Wears glasses/contact lenses and Yellow Eyes. Respiratory Not Present- Bloody sputum, Chronic Cough, Difficulty Breathing, Snoring and Wheezing. Breast Not Present- Breast Mass, Breast Pain, Nipple Discharge and Skin Changes. Cardiovascular Not Present- Chest Pain, Difficulty Breathing Lying Down, Leg Cramps, Palpitations, Rapid Heart Rate, Shortness of Breath and Swelling of Extremities. Gastrointestinal Present- Bloating, Hemorrhoids and Indigestion. Not Present-  Abdominal Pain, Bloody Stool, Change in Bowel Habits, Chronic diarrhea, Constipation, Difficulty Swallowing, Excessive gas, Gets full quickly at meals, Nausea, Rectal Pain and Vomiting. Neurological Present- Headaches. Not Present- Decreased Memory, Fainting, Numbness, Seizures, Tingling, Tremor, Trouble walking and Weakness. Psychiatric Present- Anxiety and Depression. Not Present- Bipolar, Change in Sleep Pattern, Fearful and Frequent crying. Endocrine Present- Heat Intolerance and Hot flashes. Not Present- Cold Intolerance, Excessive Hunger, Hair Changes and New Diabetes. Hematology Not Present- Blood Thinners, Easy Bruising, Excessive bleeding, Gland problems, HIV and Persistent Infections.  Vitals (Emily [name] CMA; 08/24/2019 9:57 AM) 08/24/2019 9:56 AM Weight: 184.6 lb Height: 59in Body Surface Area: 1.78 m Body Mass Index: 37.28 kg/m  Temp.: 96.26F(Temporal)  Pulse: 95 (Regular)  BP: 142/82 (Sitting, Left Arm, Standard)        Physical Exam ([place] Baer A. Sander Speckman MD; 08/24/2019 10:58 AM)  General Mental Status-Alert. General Appearance-Consistent with stated age. Hydration-Well hydrated. Voice-Normal.  Head and Neck Head-normocephalic, atraumatic with no lesions or palpable masses. Trachea-midline. Thyroid Gland Characteristics - normal size and consistency.  Eye Eyeball - Bilateral-Extraocular movements intact. Sclera/Conjunctiva - Bilateral-No scleral icterus.  Chest and Lung Exam Chest and lung exam reveals -quiet, even and easy respiratory effort with no use of accessory muscles and on auscultation, normal breath sounds, no adventitious sounds and normal vocal resonance. Inspection Chest Wall - Normal. Back - normal.  Breast Note: Bruising right upper breast noted with small hematoma. No masses. Left breast is normal.  Cardiovascular Cardiovascular examination reveals -normal heart sounds, regular rate and rhythm with no murmurs  and normal pedal pulses bilaterally.  Neurologic Neurologic evaluation reveals -alert and oriented x 3 with no impairment of recent or remote memory. Mental Status-Normal.  Musculoskeletal Normal Exam - Left-Upper Extremity Strength Normal and Lower Extremity Strength Normal. Normal Exam - Right-Upper Extremity Strength Normal and Lower Extremity Strength Normal.  Lymphatic Head & Neck  General Head & Neck Lymphatics: Bilateral - Description - Normal. Axillary  General Axillary Region: Bilateral - Description - Normal. Tenderness - Non Tender.    Assessment & Plan (Maciej Schweitzer A. 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 She  is a one and 5 chance of being operated to DCIS or invasive cancer. Discussed high risk clinic in referral to that clinic.   Risk of lumpectomy include bleeding, infection, seroma, more surgery, use of seed/wire, wound care, cosmetic deformity and the need for other treatments, death , blood clots, death. Pt agrees to proceed.    Discussed nonoperative management as well and risk reduction strategies  Current Plans Pt Education - [facility] Breast Biopsy HCI: discussed with patient and provided information. The anatomy and the physiology was discussed. The pathophysiology and natural history of the disease was discussed. Options were discussed and recommendations were made. Technique, risks, benefits, & alternatives were discussed. Risks such as stroke, heart attack, bleeding, infection, death, and other risks discussed. Questions answered. The patient agrees to proceed. Risk of seroma, cosmetic deformity, bleeding, infection, the need for further surgery, general anesthesia risk and worsening of underlying medical problems discussed. Discussed COVID risk and testing

## 2019-08-27 ENCOUNTER — Telehealth: Payer: Self-pay | Admitting: Oncology

## 2019-08-27 NOTE — Telephone Encounter (Signed)
Received a new patient referral from Dr. Brantley Stage for the high risk breast clinic. [name] has been cld and scheduled to see Dr. Jana Hakim on 11/5 at 430pm w/labs at 4pm. Pt aware to arrive 15 minutes early. I also provided the office number for the pt to reach the breast navigators.

## 2019-08-31 ENCOUNTER — Other Ambulatory Visit: Payer: Self-pay | Admitting: Surgery

## 2019-08-31 DIAGNOSIS — N6091 Unspecified benign mammary dysplasia of right breast: Secondary | ICD-10-CM

## 2019-09-16 ENCOUNTER — Other Ambulatory Visit: Payer: Self-pay

## 2019-09-16 ENCOUNTER — Encounter (HOSPITAL_BASED_OUTPATIENT_CLINIC_OR_DEPARTMENT_OTHER): Payer: Self-pay | Admitting: *Deleted

## 2019-09-17 DIAGNOSIS — M5412 Radiculopathy, cervical region: Secondary | ICD-10-CM | POA: Diagnosis not present

## 2019-09-17 DIAGNOSIS — M5030 Other cervical disc degeneration, unspecified cervical region: Secondary | ICD-10-CM | POA: Diagnosis not present

## 2019-09-17 DIAGNOSIS — M47816 Spondylosis without myelopathy or radiculopathy, lumbar region: Secondary | ICD-10-CM | POA: Diagnosis not present

## 2019-09-17 DIAGNOSIS — M5136 Other intervertebral disc degeneration, lumbar region: Secondary | ICD-10-CM | POA: Diagnosis not present

## 2019-09-18 ENCOUNTER — Other Ambulatory Visit: Payer: Self-pay

## 2019-09-18 ENCOUNTER — Encounter (HOSPITAL_BASED_OUTPATIENT_CLINIC_OR_DEPARTMENT_OTHER)
Admission: RE | Admit: 2019-09-18 | Discharge: 2019-09-18 | Disposition: A | Discharge status: Home / Self Care | Payer: Medicare Other | Source: Ambulatory Visit | Attending: Surgery | Admitting: Surgery

## 2019-09-18 DIAGNOSIS — Z01812 Encounter for preprocedural laboratory examination: Secondary | ICD-10-CM | POA: Diagnosis not present

## 2019-09-18 LAB — CBC WITH DIFFERENTIAL/PLATELET
Abs Immature Granulocytes: 0.02 10*3/uL (ref 0.00–0.07)
Basophils Absolute: 0 10*3/uL (ref 0.0–0.1)
Basophils Relative: 0 %
Eosinophils Absolute: 0.2 10*3/uL (ref 0.0–0.5)
Eosinophils Relative: 2 %
HCT: 38.9 % (ref 36.0–46.0)
Hemoglobin: 13.3 g/dL (ref 12.0–15.0)
Immature Granulocytes: 0 %
Lymphocytes Relative: 37 %
Lymphs Abs: 3.5 10*3/uL (ref 0.7–4.0)
MCH: 30.5 pg (ref 26.0–34.0)
MCHC: 34.2 g/dL (ref 30.0–36.0)
MCV: 89.2 fL (ref 80.0–100.0)
Monocytes Absolute: 0.9 10*3/uL (ref 0.1–1.0)
Monocytes Relative: 10 %
Neutro Abs: 4.9 10*3/uL (ref 1.7–7.7)
Neutrophils Relative %: 51 %
Platelets: 254 10*3/uL (ref 150–400)
RBC: 4.36 MIL/uL (ref 3.87–5.11)
RDW: 11.3 % — ABNORMAL LOW (ref 11.5–15.5)
WBC: 9.5 10*3/uL (ref 4.0–10.5)
nRBC: 0 % (ref 0.0–0.2)

## 2019-09-18 LAB — COMPREHENSIVE METABOLIC PANEL
ALT: 15 U/L (ref 0–44)
AST: 19 U/L (ref 15–41)
Albumin: 3.8 g/dL (ref 3.5–5.0)
Alkaline Phosphatase: 84 U/L (ref 38–126)
Anion gap: 11 (ref 5–15)
BUN: 6 mg/dL (ref 6–20)
CO2: 23 mmol/L (ref 22–32)
Calcium: 9.1 mg/dL (ref 8.9–10.3)
Chloride: 104 mmol/L (ref 98–111)
Creatinine, Ser: 0.83 mg/dL (ref 0.44–1.00)
GFR calc Af Amer: >60 mL/min (ref >60)
GFR calc non Af Amer: >60 mL/min (ref >60)
Glucose, Bld: 122 mg/dL — ABNORMAL HIGH (ref 70–99)
Potassium: 3.8 mmol/L (ref 3.5–5.1)
Sodium: 138 mmol/L (ref 135–145)
Total Bilirubin: 0.6 mg/dL (ref 0.3–1.2)
Total Protein: 7.1 g/dL (ref 6.5–8.1)

## 2019-09-18 NOTE — Progress Notes (Signed)

## 2019-09-19 ENCOUNTER — Other Ambulatory Visit (HOSPITAL_COMMUNITY)
Admission: RE | Admit: 2019-09-19 | Discharge: 2019-09-19 | Disposition: A | Discharge status: Home / Self Care | Payer: Medicare Other | Source: Ambulatory Visit | Attending: Surgery | Admitting: Surgery

## 2019-09-19 DIAGNOSIS — Z20828 Contact with and (suspected) exposure to other viral communicable diseases: Secondary | ICD-10-CM | POA: Diagnosis not present

## 2019-09-19 DIAGNOSIS — Z01812 Encounter for preprocedural laboratory examination: Secondary | ICD-10-CM | POA: Diagnosis not present

## 2019-09-20 LAB — NOVEL CORONAVIRUS, NAA (HOSP ORDER, SEND-OUT TO REF LAB; TAT 18-24 HRS): SARS-CoV-2, NAA: NOT DETECTED

## 2019-09-21 NOTE — Progress Notes (Signed)
Anesthesia consult per Dr. Marcell Barlow, will proceed with surgery as scheduled.

## 2019-09-22 ENCOUNTER — Telehealth: Payer: Self-pay | Admitting: *Deleted

## 2019-09-22 ENCOUNTER — Ambulatory Visit
Admission: RE | Admit: 2019-09-22 | Discharge: 2019-09-22 | Disposition: A | Discharge status: Home / Self Care | Payer: Medicare Other | Source: Ambulatory Visit | Attending: Surgery | Admitting: Surgery

## 2019-09-22 ENCOUNTER — Other Ambulatory Visit: Payer: Self-pay

## 2019-09-22 DIAGNOSIS — R921 Mammographic calcification found on diagnostic imaging of breast: Secondary | ICD-10-CM | POA: Diagnosis not present

## 2019-09-22 DIAGNOSIS — N6091 Unspecified benign mammary dysplasia of right breast: Secondary | ICD-10-CM

## 2019-09-22 NOTE — Telephone Encounter (Signed)
This RN received VM from pt stating she is scheduled for surgery on 11/4 and inquired " does my appointment for the 5th need to be rescheduled "  Pt is scheduled for New Patient High Risk.  This RN sent an Urgent staff message to Seth Bake to reschedule pt per above.

## 2019-09-23 ENCOUNTER — Encounter (HOSPITAL_BASED_OUTPATIENT_CLINIC_OR_DEPARTMENT_OTHER): Payer: Self-pay | Admitting: Anesthesiology

## 2019-09-23 ENCOUNTER — Other Ambulatory Visit: Payer: Self-pay | Admitting: *Deleted

## 2019-09-23 ENCOUNTER — Encounter (HOSPITAL_BASED_OUTPATIENT_CLINIC_OR_DEPARTMENT_OTHER)
Admission: RE | Disposition: A | Discharge status: Home / Self Care | Payer: Self-pay | Source: Home / Self Care | Attending: Surgery

## 2019-09-23 ENCOUNTER — Ambulatory Visit
Admission: RE | Admit: 2019-09-23 | Discharge: 2019-09-23 | Disposition: A | Discharge status: Home / Self Care | Payer: Medicare Other | Source: Ambulatory Visit | Attending: Surgery | Admitting: Surgery

## 2019-09-23 ENCOUNTER — Ambulatory Visit (HOSPITAL_BASED_OUTPATIENT_CLINIC_OR_DEPARTMENT_OTHER): Payer: Medicare Other | Admitting: Anesthesiology

## 2019-09-23 ENCOUNTER — Other Ambulatory Visit: Payer: Self-pay

## 2019-09-23 ENCOUNTER — Ambulatory Visit (HOSPITAL_BASED_OUTPATIENT_CLINIC_OR_DEPARTMENT_OTHER)
Admission: RE | Admit: 2019-09-23 | Discharge: 2019-09-23 | Disposition: A | Discharge status: Home / Self Care | Payer: Medicare Other | Attending: Surgery | Admitting: Surgery

## 2019-09-23 DIAGNOSIS — F419 Anxiety disorder, unspecified: Secondary | ICD-10-CM | POA: Insufficient documentation

## 2019-09-23 DIAGNOSIS — M1991 Primary osteoarthritis, unspecified site: Secondary | ICD-10-CM | POA: Diagnosis not present

## 2019-09-23 DIAGNOSIS — N6091 Unspecified benign mammary dysplasia of right breast: Secondary | ICD-10-CM

## 2019-09-23 DIAGNOSIS — D241 Benign neoplasm of right breast: Secondary | ICD-10-CM | POA: Diagnosis not present

## 2019-09-23 DIAGNOSIS — Z7951 Long term (current) use of inhaled steroids: Secondary | ICD-10-CM | POA: Insufficient documentation

## 2019-09-23 DIAGNOSIS — Z885 Allergy status to narcotic agent status: Secondary | ICD-10-CM | POA: Diagnosis not present

## 2019-09-23 DIAGNOSIS — F329 Major depressive disorder, single episode, unspecified: Secondary | ICD-10-CM | POA: Insufficient documentation

## 2019-09-23 DIAGNOSIS — K219 Gastro-esophageal reflux disease without esophagitis: Secondary | ICD-10-CM | POA: Diagnosis not present

## 2019-09-23 DIAGNOSIS — Z791 Long term (current) use of non-steroidal anti-inflammatories (NSAID): Secondary | ICD-10-CM | POA: Insufficient documentation

## 2019-09-23 DIAGNOSIS — J45909 Unspecified asthma, uncomplicated: Secondary | ICD-10-CM | POA: Insufficient documentation

## 2019-09-23 DIAGNOSIS — Z79899 Other long term (current) drug therapy: Secondary | ICD-10-CM | POA: Diagnosis not present

## 2019-09-23 DIAGNOSIS — N6489 Other specified disorders of breast: Secondary | ICD-10-CM | POA: Diagnosis not present

## 2019-09-23 DIAGNOSIS — I10 Essential (primary) hypertension: Secondary | ICD-10-CM | POA: Diagnosis not present

## 2019-09-23 DIAGNOSIS — D509 Iron deficiency anemia, unspecified: Secondary | ICD-10-CM | POA: Diagnosis not present

## 2019-09-23 DIAGNOSIS — F17200 Nicotine dependence, unspecified, uncomplicated: Secondary | ICD-10-CM | POA: Diagnosis not present

## 2019-09-23 DIAGNOSIS — N6011 Diffuse cystic mastopathy of right breast: Secondary | ICD-10-CM | POA: Diagnosis not present

## 2019-09-23 DIAGNOSIS — R921 Mammographic calcification found on diagnostic imaging of breast: Secondary | ICD-10-CM | POA: Diagnosis not present

## 2019-09-23 HISTORY — DX: Other specified postprocedural states: Z98.890

## 2019-09-23 HISTORY — DX: Nausea with vomiting, unspecified: R11.2

## 2019-09-23 HISTORY — PX: BREAST LUMPECTOMY WITH RADIOACTIVE SEED LOCALIZATION: SHX6424

## 2019-09-23 SURGERY — BREAST LUMPECTOMY WITH RADIOACTIVE SEED LOCALIZATION
Anesthesia: General | Site: Breast | Laterality: Right

## 2019-09-23 MED ORDER — CALCIUM CHLORIDE 10 % IV SOLN
INTRAVENOUS | Status: AC
  Filled 2019-09-23: qty 10

## 2019-09-23 MED ORDER — CHLORHEXIDINE GLUCONATE CLOTH 2 % EX PADS: 6.0000 | MEDICATED_PAD | Freq: Once | CUTANEOUS | Status: DC

## 2019-09-23 MED ORDER — ACETAMINOPHEN 500 MG PO TABS
ORAL_TABLET | ORAL | Status: AC
  Filled 2019-09-23: qty 2

## 2019-09-23 MED ORDER — CELECOXIB 200 MG PO CAPS
ORAL_CAPSULE | ORAL | Status: AC
  Filled 2019-09-23: qty 1

## 2019-09-23 MED ORDER — CELECOXIB 200 MG PO CAPS
200.0000 mg | ORAL_CAPSULE | ORAL | Status: AC
  Administered 2019-09-23: 200 mg via ORAL

## 2019-09-23 MED ORDER — FENTANYL CITRATE (PF) 100 MCG/2ML IJ SOLN
INTRAMUSCULAR | Status: DC | PRN
  Administered 2019-09-23: 100 ug via INTRAVENOUS

## 2019-09-23 MED ORDER — FENTANYL CITRATE (PF) 100 MCG/2ML IJ SOLN: 25.0000 ug | INTRAMUSCULAR | Status: DC | PRN

## 2019-09-23 MED ORDER — ONDANSETRON HCL 4 MG/2ML IJ SOLN
INTRAMUSCULAR | Status: DC | PRN
  Administered 2019-09-23: 4 mg via INTRAVENOUS

## 2019-09-23 MED ORDER — LIDOCAINE 2% (20 MG/ML) 5 ML SYRINGE
INTRAMUSCULAR | Status: AC
  Filled 2019-09-23: qty 5

## 2019-09-23 MED ORDER — HYDROCODONE-ACETAMINOPHEN 5-325 MG PO TABS: 1.0000 | ORAL_TABLET | Freq: Four times a day (QID) | ORAL | 0 refills | Status: DC | PRN

## 2019-09-23 MED ORDER — GABAPENTIN 300 MG PO CAPS
300.0000 mg | ORAL_CAPSULE | ORAL | Status: AC
  Administered 2019-09-23: 300 mg via ORAL

## 2019-09-23 MED ORDER — FENTANYL CITRATE (PF) 100 MCG/2ML IJ SOLN: 50.0000 ug | INTRAMUSCULAR | Status: DC | PRN

## 2019-09-23 MED ORDER — CEFAZOLIN SODIUM-DEXTROSE 2-4 GM/100ML-% IV SOLN
INTRAVENOUS | Status: AC
  Filled 2019-09-23: qty 100

## 2019-09-23 MED ORDER — IBUPROFEN 800 MG PO TABS: 800.0000 mg | ORAL_TABLET | Freq: Three times a day (TID) | ORAL | 0 refills | Status: DC | PRN

## 2019-09-23 MED ORDER — DEXAMETHASONE SODIUM PHOSPHATE 10 MG/ML IJ SOLN
INTRAMUSCULAR | Status: AC
  Filled 2019-09-23: qty 1

## 2019-09-23 MED ORDER — GABAPENTIN 300 MG PO CAPS
ORAL_CAPSULE | ORAL | Status: AC
  Filled 2019-09-23: qty 1

## 2019-09-23 MED ORDER — PROPOFOL 10 MG/ML IV BOLUS
INTRAVENOUS | Status: DC | PRN
  Administered 2019-09-23: 180 mg via INTRAVENOUS

## 2019-09-23 MED ORDER — ONDANSETRON HCL 4 MG/2ML IJ SOLN
INTRAMUSCULAR | Status: AC
  Filled 2019-09-23: qty 2

## 2019-09-23 MED ORDER — DEXAMETHASONE SODIUM PHOSPHATE 4 MG/ML IJ SOLN
INTRAMUSCULAR | Status: DC | PRN
  Administered 2019-09-23: 10 mg via INTRAVENOUS

## 2019-09-23 MED ORDER — MIDAZOLAM HCL 5 MG/5ML IJ SOLN
INTRAMUSCULAR | Status: DC | PRN
  Administered 2019-09-23: 2 mg via INTRAVENOUS

## 2019-09-23 MED ORDER — DEXTROSE 5 % IV SOLN
3.0000 g | INTRAVENOUS | Status: AC
  Administered 2019-09-23: 2 g via INTRAVENOUS

## 2019-09-23 MED ORDER — BUPIVACAINE HCL (PF) 0.25 % IJ SOLN
INTRAMUSCULAR | Status: DC | PRN
  Administered 2019-09-23: 30 mL

## 2019-09-23 MED ORDER — MIDAZOLAM HCL 2 MG/2ML IJ SOLN: 1.0000 mg | INTRAMUSCULAR | Status: DC | PRN

## 2019-09-23 MED ORDER — FENTANYL CITRATE (PF) 100 MCG/2ML IJ SOLN
INTRAMUSCULAR | Status: AC
  Filled 2019-09-23: qty 2

## 2019-09-23 MED ORDER — LIDOCAINE 2% (20 MG/ML) 5 ML SYRINGE
INTRAMUSCULAR | Status: DC | PRN
  Administered 2019-09-23: 25 mg via INTRAVENOUS

## 2019-09-23 MED ORDER — PROPOFOL 10 MG/ML IV BOLUS
INTRAVENOUS | Status: AC
  Filled 2019-09-23: qty 20

## 2019-09-23 MED ORDER — ONDANSETRON HCL 4 MG/2ML IJ SOLN: 4.0000 mg | Freq: Once | INTRAMUSCULAR | Status: DC | PRN

## 2019-09-23 MED ORDER — EPHEDRINE SULFATE 50 MG/ML IJ SOLN
INTRAMUSCULAR | Status: DC | PRN
  Administered 2019-09-23: 10 mg via INTRAVENOUS

## 2019-09-23 MED ORDER — PHENYLEPHRINE 40 MCG/ML (10ML) SYRINGE FOR IV PUSH (FOR BLOOD PRESSURE SUPPORT)
PREFILLED_SYRINGE | INTRAVENOUS | Status: AC
  Filled 2019-09-23: qty 10

## 2019-09-23 MED ORDER — MIDAZOLAM HCL 2 MG/2ML IJ SOLN
INTRAMUSCULAR | Status: AC
  Filled 2019-09-23: qty 2

## 2019-09-23 MED ORDER — BUPIVACAINE HCL (PF) 0.25 % IJ SOLN
INTRAMUSCULAR | Status: AC
  Filled 2019-09-23: qty 60

## 2019-09-23 MED ORDER — ACETAMINOPHEN 500 MG PO TABS
1000.0000 mg | ORAL_TABLET | ORAL | Status: AC
  Administered 2019-09-23: 1000 mg via ORAL

## 2019-09-23 MED ORDER — LACTATED RINGERS IV SOLN
INTRAVENOUS | Status: DC
  Administered 2019-09-23 (×2): via INTRAVENOUS

## 2019-09-23 MED ORDER — PHENYLEPHRINE HCL (PRESSORS) 10 MG/ML IV SOLN
INTRAVENOUS | Status: DC | PRN
  Administered 2019-09-23 (×3): 80 ug via INTRAVENOUS

## 2019-09-23 SURGICAL SUPPLY — 52 items
ADH SKN CLS APL DERMABOND .7 (GAUZE/BANDAGES/DRESSINGS)
APL PRP STRL LF DISP 70% ISPRP (MISCELLANEOUS) ×1
APPLIER CLIP 9.375 MED OPEN (MISCELLANEOUS)
APR CLP MED 9.3 20 MLT OPN (MISCELLANEOUS)
BINDER BREAST LRG (GAUZE/BANDAGES/DRESSINGS) IMPLANT
BINDER BREAST MEDIUM (GAUZE/BANDAGES/DRESSINGS) IMPLANT
BINDER BREAST XLRG (GAUZE/BANDAGES/DRESSINGS) ×1 IMPLANT
BINDER BREAST XXLRG (GAUZE/BANDAGES/DRESSINGS) IMPLANT
BLADE SURG 15 STRL LF DISP TIS (BLADE) ×1 IMPLANT
BLADE SURG 15 STRL SS (BLADE) ×2
CANISTER SUC SOCK COL 7IN (MISCELLANEOUS) IMPLANT
CANISTER SUCT 1200ML W/VALVE (MISCELLANEOUS) ×1 IMPLANT
CHLORAPREP W/TINT 26 (MISCELLANEOUS) ×2 IMPLANT
CLIP APPLIE 9.375 MED OPEN (MISCELLANEOUS) IMPLANT
COVER BACK TABLE REUSABLE LG (DRAPES) ×2 IMPLANT
COVER MAYO STAND REUSABLE (DRAPES) ×2 IMPLANT
COVER PROBE W GEL 5X96 (DRAPES) ×2 IMPLANT
COVER WAND RF STERILE (DRAPES) IMPLANT
DECANTER SPIKE VIAL GLASS SM (MISCELLANEOUS) IMPLANT
DERMABOND ADVANCED (GAUZE/BANDAGES/DRESSINGS)
DERMABOND ADVANCED .7 DNX12 (GAUZE/BANDAGES/DRESSINGS) ×1 IMPLANT
DRAPE LAPAROSCOPIC ABDOMINAL (DRAPES) IMPLANT
DRAPE LAPAROTOMY 100X72 PEDS (DRAPES) ×2 IMPLANT
DRAPE UTILITY XL STRL (DRAPES) ×2 IMPLANT
ELECT COATED BLADE 2.86 ST (ELECTRODE) ×2 IMPLANT
ELECT REM PT RETURN 9FT ADLT (ELECTROSURGICAL) ×2
ELECTRODE REM PT RTRN 9FT ADLT (ELECTROSURGICAL) ×1 IMPLANT
GLOVE BIO SURGEON STRL SZ 6.5 (GLOVE) ×1 IMPLANT
GLOVE BIOGEL PI IND STRL 8 (GLOVE) ×1 IMPLANT
GLOVE BIOGEL PI INDICATOR 8 (GLOVE) ×1
GLOVE ECLIPSE 8.0 STRL XLNG CF (GLOVE) ×2 IMPLANT
GOWN STRL REUS W/ TWL LRG LVL3 (GOWN DISPOSABLE) ×2 IMPLANT
GOWN STRL REUS W/TWL LRG LVL3 (GOWN DISPOSABLE) ×4
HEMOSTAT ARISTA ABSORB 3G PWDR (HEMOSTASIS) IMPLANT
HEMOSTAT SNOW SURGICEL 2X4 (HEMOSTASIS) IMPLANT
KIT MARKER MARGIN INK (KITS) ×2 IMPLANT
NDL HYPO 25X1 1.5 SAFETY (NEEDLE) ×1 IMPLANT
NEEDLE HYPO 25X1 1.5 SAFETY (NEEDLE) ×2 IMPLANT
NS IRRIG 1000ML POUR BTL (IV SOLUTION) ×2 IMPLANT
PACK BASIN DAY SURGERY FS (CUSTOM PROCEDURE TRAY) ×2 IMPLANT
PENCIL BUTTON HOLSTER BLD 10FT (ELECTRODE) ×2 IMPLANT
SLEEVE SCD COMPRESS KNEE MED (MISCELLANEOUS) ×2 IMPLANT
SPONGE LAP 4X18 RFD (DISPOSABLE) ×3 IMPLANT
STRIP CLOSURE SKIN 1/2X4 (GAUZE/BANDAGES/DRESSINGS) ×1 IMPLANT
SUT MNCRL AB 4-0 PS2 18 (SUTURE) ×2 IMPLANT
SUT SILK 2 0 SH (SUTURE) IMPLANT
SUT VICRYL 3-0 CR8 SH (SUTURE) ×2 IMPLANT
SYR CONTROL 10ML LL (SYRINGE) ×2 IMPLANT
TOWEL GREEN STERILE FF (TOWEL DISPOSABLE) ×2 IMPLANT
TRAY FAXITRON CT DISP (TRAY / TRAY PROCEDURE) ×2 IMPLANT
TUBE CONNECTING 20X1/4 (TUBING) ×1 IMPLANT
YANKAUER SUCT BULB TIP NO VENT (SUCTIONS) ×1 IMPLANT

## 2019-09-23 NOTE — Interval H&P Note (Signed)
History and Physical Interval Note:  09/23/2019 9:44 AM  Emily [name]  has presented today for surgery, with the diagnosis of RIGHT ATYPICAL DUCTAL HYPERPLASIA.  The various methods of treatment have been discussed with the patient and family. After consideration of risks, benefits and other options for treatment, the patient has consented to  Procedure(s): RIGHT BREAST LUMPECTOMY WITH RADIOACTIVE SEED LOCALIZATION (Right) as a surgical intervention.  The patient's history has been reviewed, patient examined, no change in status, stable for surgery.  I have reviewed the patient's chart and labs.  Questions were answered to the patient's satisfaction.     [place]

## 2019-09-23 NOTE — Transfer of Care (Signed)
Immediate Anesthesia Transfer of Care Note  Patient: Emily [name]  Procedure(s) Performed: RIGHT BREAST LUMPECTOMY WITH RADIOACTIVE SEED LOCALIZATION (Right Breast)  Patient Location: PACU  Anesthesia Type:General  Level of Consciousness: drowsy and patient cooperative  Airway & Oxygen Therapy: Patient Spontanous Breathing and Patient connected to face mask oxygen  Post-op Assessment: Report given to RN and Post -op Vital signs reviewed and stable  Post vital signs: Reviewed and stable  Last Vitals:  Vitals Value Taken Time  BP 117/67 09/23/19 1110  Temp    Pulse 78 09/23/19 1111  Resp 10 09/23/19 1111  SpO2 100 % 09/23/19 1111  Vitals shown include unvalidated device data.  Last Pain:  Vitals:   09/23/19 0844  TempSrc: Oral  PainSc: 0-No pain      Patients Stated Pain Goal: 4 ([id number])  Complications: No apparent anesthesia complications

## 2019-09-23 NOTE — Discharge Instructions (Signed)
[facility] °Office Phone Number [phone] ° °BREAST BIOPSY/ PARTIAL MASTECTOMY: POST OP INSTRUCTIONS ° °Always review your discharge instruction sheet given to you by the facility where your surgery was performed. ° °IF YOU HAVE DISABILITY OR FAMILY LEAVE FORMS, YOU MUST BRING THEM TO THE OFFICE FOR PROCESSING.  DO NOT GIVE THEM TO YOUR DOCTOR. ° °1. A prescription for pain medication may be given to you upon discharge.  Take your pain medication as prescribed, if needed.  If narcotic pain medicine is not needed, then you may take acetaminophen (Tylenol) or ibuprofen (Advil) as needed. °2. Take your usually prescribed medications unless otherwise directed °3. If you need a refill on your pain medication, please contact your pharmacy.  They will contact our office to request authorization.  Prescriptions will not be filled after 5pm or on week-ends. °4. You should eat very light the first 24 hours after surgery, such as soup, crackers, pudding, etc.  Resume your normal diet the day after surgery. °5. Most patients will experience some swelling and bruising in the breast.  Ice packs and a good support bra will help.  Swelling and bruising can take several days to resolve.  °6. It is common to experience some constipation if taking pain medication after surgery.  Increasing fluid intake and taking a stool softener will usually help or prevent this problem from occurring.  A mild laxative (Milk of Magnesia or Miralax) should be taken according to package directions if there are no bowel movements after 48 hours. °7. Unless discharge instructions indicate otherwise, you may remove your bandages 24-48 hours after surgery, and you may shower at that time.  You may have steri-strips (small skin tapes) in place directly over the incision.  These strips should be left on the skin for 7-10 days.  If your surgeon used skin glue on the incision, you may shower in 24 hours.  The glue will flake off over the  next 2-3 weeks.  Any sutures or staples will be removed at the office during your follow-up visit. °8. ACTIVITIES:  You may resume regular daily activities (gradually increasing) beginning the next day.  Wearing a good support bra or sports bra minimizes pain and swelling.  You may have sexual intercourse when it is comfortable. °a. You may drive when you no longer are taking prescription pain medication, you can comfortably wear a seatbelt, and you can safely maneuver your car and apply brakes. °b. RETURN TO WORK:  ______________________________________________________________________________________ °9. You should see your doctor in the office for a follow-up appointment approximately two weeks after your surgery.  Your doctor’s nurse will typically make your follow-up appointment when she calls you with your pathology report.  Expect your pathology report 2-3 business days after your surgery.  You may call to check if you do not hear from us after three days. °10. OTHER INSTRUCTIONS: _______________________________________________________________________________________________ _____________________________________________________________________________________________________________________________________ °_____________________________________________________________________________________________________________________________________ °_____________________________________________________________________________________________________________________________________ ° °WHEN TO CALL YOUR DOCTOR: °1. Fever over 101.0 °2. Nausea and/or vomiting. °3. Extreme swelling or bruising. °4. Continued bleeding from incision. °5. Increased pain, redness, or drainage from the incision. ° °The clinic staff is available to answer your questions during regular business hours.  Please don’t hesitate to call and ask to speak to one of the nurses for clinical concerns.  If you have a medical emergency, go to the nearest  emergency room or call 911.  A surgeon from [facility] is always on call at the hospital. ° °For further questions, please visit [email or web address]  ° ° ° ° °  Post Anesthesia Home Care Instructions ° °Activity: °Get plenty of rest for the remainder of the day. A responsible individual must stay with you for 24 hours following the procedure.  °For the next 24 hours, DO NOT: °-Drive a car °-Operate machinery °-Drink alcoholic beverages °-Take any medication unless instructed by your physician °-Make any legal decisions or sign important papers. ° °Meals: °Start with liquid foods such as gelatin or soup. Progress to regular foods as tolerated. Avoid greasy, spicy, heavy foods. If nausea and/or vomiting occur, drink only clear liquids until the nausea and/or vomiting subsides. Call your physician if vomiting continues. ° °Special Instructions/Symptoms: °Your throat may feel dry or sore from the anesthesia or the breathing tube placed in your throat during surgery. If this causes discomfort, gargle with warm salt water. The discomfort should disappear within 24 hours. ° °If you had a scopolamine patch placed behind your ear for the management of post- operative nausea and/or vomiting: ° °1. The medication in the patch is effective for 72 hours, after which it should be removed.  Wrap patch in a tissue and discard in the trash. Wash hands thoroughly with soap and water. °2. You may remove the patch earlier than 72 hours if you experience unpleasant side effects which may include dry mouth, dizziness or visual disturbances. °3. Avoid touching the patch. Wash your hands with soap and water after contact with the patch. °  ° °

## 2019-09-23 NOTE — Op Note (Signed)
Preoperative diagnosis: Right breast atypical ductal hyperplasia lower outer quadrant  Postoperative diagnosis: Same  Procedure: Right breast seed localized lumpectomy  Surgeon: Erroll Luna MD  Anesthesia: LMA with 0.5% local Marcaine  Drains: None  Specimen: Right breast tissue is seen.  The clip had migrated away from the seed was no longer in the operative field.  Additional anterior, medial, lateral, superior, and inferior margins taken.  Discussed with radiologist and given the fact that the clip was displaced this was normal good necessary to remove.  EBL: Minimal   Indications for procedure: The patient presents for right breast lumpectomy after mammogram showed microcalcifications in the right breast.  Core biopsy showed atypical ductal hyperplasia.  We discussed the significance of this finding and the rationale for lumpectomy to exclude upgrade to a more invasive problem.  She has been referred to a high risk clinic as well.  She is agreed to lumpectomy.The procedure has been discussed with the patient. Alternatives to surgery have been discussed with the patient.  Risks of surgery include bleeding,  Infection,  Seroma formation, death,  and the need for further surgery.   The patient understands and wishes to proceed.    Description of procedure: The patient was met in the holding area.  Right side was marked as correct and neoprobe used to verify seed location.  She was taken back to the operating.  She is placed supine upon the OR table.  After induction of general esthesia the right breast was prepped and draped in sterile fashion and timeout was performed.  She received appropriate preoperative antibiotics.  The neoprobe to localize the seed in the right lower outer quadrant approximately 2 cm from the nipple areolar complex.  Curvilinear incision was made along this border.  It was quite deep.  I dissected down with the help of the films and neoprobe.  I excised the tissue  around the seed.  Faxitron revealed the seed to be the specimens with the microcalcifications.  The biopsy clip migrated away from this area.  I went ahead and excised the margins around it.  Again the clip was no longer present in a migrated beyond the immediate area of the calcifications and tissue.  After discussion with the radiologist we felt given the migration of the clip from the specimen, no further need to remove this was necessary without causing more tissue loss with no benefit.  Hemostasis was achieved.  All specimens were oriented and sent to pathology.  The seed was sent to pathology.  Local anesthetic was infiltrated into the lumpectomy cavity and this was closed with 3-0 Vicryl and 4-0 Monocryl.  A single Steri-Strip was used as dressing.  All counts were correct.  Breast binder placed.  The patient awoke extubated taken to recovery in satisfactory condition.

## 2019-09-23 NOTE — Anesthesia Preprocedure Evaluation (Addendum)
Anesthesia Evaluation  Patient identified by MRN, date of birth, ID band Patient awake    Reviewed: Allergy & Precautions, NPO status , Patient's Chart, lab work & pertinent test results  Airway Mallampati: II  TM Distance: >3 FB Neck ROM: Full    Dental  (+) Edentulous Upper, Partial Lower   Pulmonary Current Smoker and Patient abstained from smoking.,    breath sounds clear to auscultation       Cardiovascular hypertension,  Rhythm:Regular Rate:Normal     Neuro/Psych    GI/Hepatic   Endo/Other    Renal/GU      Musculoskeletal   Abdominal   Peds  Hematology   Anesthesia Other Findings   Reproductive/Obstetrics                            Anesthesia Physical Anesthesia Plan  ASA: III  Anesthesia Plan: General   Post-op Pain Management:    Induction: Intravenous  PONV Risk Score and Plan: Ondansetron and Dexamethasone  Airway Management Planned: LMA  Additional Equipment:   Intra-op Plan:   Post-operative Plan:   Informed Consent: I have reviewed the patients History and Physical, chart, labs and discussed the procedure including the risks, benefits and alternatives for the proposed anesthesia with the patient or authorized representative who has indicated his/her understanding and acceptance.     Dental advisory given  Plan Discussed with: CRNA  Anesthesia Plan Comments:         Anesthesia Quick Evaluation

## 2019-09-23 NOTE — Anesthesia Procedure Notes (Signed)
Procedure Name: LMA Insertion Date/Time: 09/23/2019 10:01 AM Performed by: Marrianne Mood, CRNA Pre-anesthesia Checklist: Patient identified, Emergency Drugs available, Suction available, Patient being monitored and Timeout performed Patient Re-evaluated:Patient Re-evaluated prior to induction Oxygen Delivery Method: Circle system utilized Preoxygenation: Pre-oxygenation with 100% oxygen Induction Type: IV induction Ventilation: Mask ventilation without difficulty LMA: LMA inserted LMA Size: 4.0 Number of attempts: 1 Airway Equipment and Method: Bite block Placement Confirmation: positive ETCO2 Tube secured with: Tape Dental Injury: Teeth and Oropharynx as per pre-operative assessment

## 2019-09-24 ENCOUNTER — Inpatient Hospital Stay: Payer: Medicare Other

## 2019-09-24 ENCOUNTER — Encounter (HOSPITAL_BASED_OUTPATIENT_CLINIC_OR_DEPARTMENT_OTHER): Payer: Self-pay | Admitting: Surgery

## 2019-09-24 ENCOUNTER — Inpatient Hospital Stay: Payer: Medicare Other | Attending: Oncology | Admitting: Oncology

## 2019-09-24 DIAGNOSIS — Z1239 Encounter for other screening for malignant neoplasm of breast: Secondary | ICD-10-CM | POA: Insufficient documentation

## 2019-09-24 DIAGNOSIS — Z9189 Other specified personal risk factors, not elsewhere classified: Secondary | ICD-10-CM | POA: Insufficient documentation

## 2019-09-24 DIAGNOSIS — N6091 Unspecified benign mammary dysplasia of right breast: Secondary | ICD-10-CM | POA: Insufficient documentation

## 2019-09-24 DIAGNOSIS — N6092 Unspecified benign mammary dysplasia of left breast: Secondary | ICD-10-CM | POA: Insufficient documentation

## 2019-09-24 NOTE — Anesthesia Postprocedure Evaluation (Signed)
Anesthesia Post Note  Patient: Occupational psychologist  Procedure(s) Performed: RIGHT BREAST LUMPECTOMY WITH RADIOACTIVE SEED LOCALIZATION (Right Breast)     Patient location during evaluation: PACU Anesthesia Type: General Level of consciousness: awake and alert Pain management: pain level controlled Vital Signs Assessment: post-procedure vital signs reviewed and stable Respiratory status: spontaneous breathing, nonlabored ventilation, respiratory function stable and patient connected to nasal cannula oxygen Cardiovascular status: blood pressure returned to baseline and stable Postop Assessment: no apparent nausea or vomiting Anesthetic complications: no    Last Vitals:  Vitals:   09/23/19 1130 09/23/19 1154  BP: 108/68 103/74  Pulse: 79 75  Resp: 18 18  Temp:  36.7 C  SpO2: 100% 94%    Last Pain:  Vitals:   09/24/19 1009  TempSrc:   PainSc: 3                  Emylee Decelle COKER

## 2019-09-28 ENCOUNTER — Telehealth: Payer: Self-pay | Admitting: Oncology

## 2019-09-28 ENCOUNTER — Other Ambulatory Visit: Payer: Self-pay | Admitting: Internal Medicine

## 2019-09-28 ENCOUNTER — Other Ambulatory Visit: Payer: Self-pay | Admitting: Podiatry

## 2019-09-28 LAB — SURGICAL PATHOLOGY

## 2019-09-28 NOTE — Telephone Encounter (Signed)
Ms. Greenan has been cld and rescheduled to see Dr. Jana Hakim on 12/14 at 4pm. Pt aware to arrive 15 minutes early.

## 2019-09-28 NOTE — Telephone Encounter (Signed)
Appointment scheduled.

## 2019-09-28 NOTE — Telephone Encounter (Signed)
Needs visit as this is not a chronic medication for him and was given for asthma flare. If still breathing problems needs to be virtual.

## 2019-09-30 ENCOUNTER — Encounter: Payer: Self-pay | Admitting: Internal Medicine

## 2019-09-30 ENCOUNTER — Ambulatory Visit (INDEPENDENT_AMBULATORY_CARE_PROVIDER_SITE_OTHER): Payer: Medicare Other | Admitting: Internal Medicine

## 2019-09-30 DIAGNOSIS — K5903 Drug induced constipation: Secondary | ICD-10-CM

## 2019-09-30 DIAGNOSIS — Z720 Tobacco use: Secondary | ICD-10-CM | POA: Diagnosis not present

## 2019-09-30 DIAGNOSIS — J4520 Mild intermittent asthma, uncomplicated: Secondary | ICD-10-CM | POA: Diagnosis not present

## 2019-09-30 DIAGNOSIS — T402X5A Adverse effect of other opioids, initial encounter: Secondary | ICD-10-CM | POA: Diagnosis not present

## 2019-09-30 DIAGNOSIS — J410 Simple chronic bronchitis: Secondary | ICD-10-CM | POA: Insufficient documentation

## 2019-09-30 MED ORDER — SENNOSIDES-DOCUSATE SODIUM 8.6-50 MG PO TABS: 1.0000 | ORAL_TABLET | Freq: Every day | ORAL | 11 refills | Status: DC

## 2019-09-30 MED ORDER — FLOVENT HFA 110 MCG/ACT IN AERO: 2.0000 | INHALATION_SPRAY | Freq: Two times a day (BID) | RESPIRATORY_TRACT | 6 refills | Status: DC

## 2019-09-30 MED ORDER — CHANTIX STARTING MONTH PAK 0.5 MG X 11 & 1 MG X 42 PO TABS: ORAL_TABLET | ORAL | 0 refills | Status: DC

## 2019-09-30 MED ORDER — VARENICLINE TARTRATE 1 MG PO TABS: 1.0000 mg | ORAL_TABLET | Freq: Two times a day (BID) | ORAL | 3 refills | Status: DC

## 2019-09-30 NOTE — Assessment & Plan Note (Signed)
Refill senokot-d to help with constipation. No clinical signs of obstruction on exam today.

## 2019-09-30 NOTE — Assessment & Plan Note (Signed)
Rx flovent and refill albuterol if needed. She is advised on usage and timeline for flovent to start working since she is not taking daily. Encouraged to take daily for allergy season to avoid albuterol usage.

## 2019-09-30 NOTE — Assessment & Plan Note (Signed)
Rx chantix to help her stop smoking as she wishes to make an attempt which was encouraged today. Talked to her about usage and potential side effects.

## 2019-09-30 NOTE — Progress Notes (Signed)
Virtual Visit via Video Note  I connected with Emily [name] on 09/30/19 at 10:40 AM EST by a video enabled telemedicine application and verified that I am speaking with the correct person using two identifiers.  The patient and the provider were at separate locations throughout the entire encounter.   I discussed the limitations of evaluation and management by telemedicine and the availability of in person appointments. The patient expressed understanding and agreed to proceed. The patient and the provider were the only parties present for the visit unless noted in HPI below.  History of Present Illness: The patient is a 48 y.o. 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  Observations/Objective: Appearance: normal, breathing appears normal, no coughing or dyspnea during visit, casual grooming, abdomen does not appear distended, throat normal, mental status is A and O times 3  Assessment and Plan: See problem oriented charting  Follow Up Instructions: refill flovent, rx chantix and counseled, refill senokot-d  I discussed the assessment and treatment plan with the patient. The patient was provided an opportunity to ask questions and all were answered. The patient agreed with the plan and demonstrated an understanding of the instructions.   The patient was advised to call back or seek an in-person evaluation if the symptoms worsen or if the  condition fails to improve as anticipated.  Hoyt Koch, MD

## 2019-11-02 ENCOUNTER — Inpatient Hospital Stay: Payer: Medicare Other | Attending: Oncology | Admitting: Oncology

## 2019-11-02 ENCOUNTER — Other Ambulatory Visit: Payer: Self-pay

## 2019-11-02 ENCOUNTER — Other Ambulatory Visit: Payer: Self-pay | Admitting: *Deleted

## 2019-11-02 DIAGNOSIS — F17210 Nicotine dependence, cigarettes, uncomplicated: Secondary | ICD-10-CM | POA: Diagnosis not present

## 2019-11-02 DIAGNOSIS — Z800 Family history of malignant neoplasm of digestive organs: Secondary | ICD-10-CM

## 2019-11-02 DIAGNOSIS — Z803 Family history of malignant neoplasm of breast: Secondary | ICD-10-CM

## 2019-11-02 DIAGNOSIS — N6091 Unspecified benign mammary dysplasia of right breast: Secondary | ICD-10-CM

## 2019-11-02 DIAGNOSIS — N6099 Unspecified benign mammary dysplasia of unspecified breast: Secondary | ICD-10-CM

## 2019-11-02 NOTE — Progress Notes (Signed)
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 She presented with pinprick pain in the left breast at a site of previous cysts. She underwent bilateral diagnostic mammography with tomography and left breast ultrasonography at [facility] on 08/10/2019 showing: breast density category C; no cause for patient's left-sided symptoms; new right breast calcifications.  Accordingly on 08/12/2019 she proceeded to biopsy of the right breast area in question. The pathology from this procedure ([id number]) showed: atypical ductal hyperplasia with calcifications.  She proceeded to right lumpectomy on 09/23/2019 under Dr. Brantley Stage. Pathology from the procedure ([id number]) showed: atypical ductal hyperplasia; lobular neoplasia; fibrocystic change; pseudoangiomatous stromal hyperplasia.  The patient's subsequent history is as detailed below.   INTERVAL HISTORY: Emily [name] was evaluated in the high risk breast cancer clinic on 11/02/2019 accompanied by her significant other Emily [name]: The patient has a history of migraines and occasional wheezing from asthma.  She denies, visual changes, nausea, vomiting, dizziness, or gait imbalance. There has been no phlegm production, or pleurisy, no chest pain or pressure, and no change in bowel or bladder habits. The patient denies  fever, rash, bleeding, unexplained fatigue or unexplained weight loss. A detailed review of systems was otherwise entirely negative.   PAST MEDICAL HISTORY: Past Medical History:  Diagnosis Date  . Allergic rhinitis   . Anal fissure   . Anemia   . Anxiety   . Arthritis   . Asthma    uses inhaler as needed  . Chronic neck pain    pain mgmt in [place]  . Depression   . Gallbladder disease   . GERD (gastroesophageal reflux disease)   . Headache(784.0)   . HTN (hypertension)    BP has been normal recently per pt and not taking meds  . Hyperthyroidism   . Obesity   . PONV (postoperative nausea and vomiting)   . Thyroid nodule    incidental on MRI neck 2014, s/p endo eval  . Uterine fibroid     PAST SURGICAL HISTORY: Past Surgical History:  Procedure Laterality Date  . BREAST LUMPECTOMY WITH RADIOACTIVE SEED LOCALIZATION Right 09/23/2019   Procedure: RIGHT BREAST LUMPECTOMY WITH RADIOACTIVE SEED LOCALIZATION;  Surgeon: Erroll Luna, MD;  Location: [facility];  Service: General;  Laterality: Right;  . CERVICAL SPINE SURGERY     2 disc removed  . [place] Malformation  2003  . CHOLECYSTECTOMY  1993  . DILITATION & CURRETTAGE/HYSTROSCOPY WITH NOVASURE ABLATION N/A 03/15/2014   Procedure: DILATATION & CURETTAGE/HYSTEROSCOPY WITH NOVASURE ABLATION;  Surgeon: Lovenia Kim, MD;  Location: [facility];  Service: Gynecology;  Laterality: N/A;  . Laberal tear  2001   Right Shoulder  . LAPAROSCOPIC TOTAL HYSTERECTOMY  02/03/2018  . 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 WISDOM TOOTH EXTRACTION  1992    FAMILY HISTORY: Family History  Problem Relation Age of Onset  . Breast cancer Mother  over 50 when dx  . Diverticulitis Mother   . Arthritis Mother   . Hyperlipidemia Mother   . Hypertension Mother   . Pancreatic cancer Paternal Grandmother   . Breast cancer Paternal Aunt        x 2 pat aunts  . Irritable bowel syndrome Daughter     . Hypotension Daughter   . Alcohol abuse Son   . Alcohol abuse Paternal Uncle   . Alcohol abuse Maternal Uncle        x several  . Colon cancer Other        maternal grandmothers sister   As of December 2020 the patient's father is 32 years old and her mother 54.  The patient's father's mother had pancreatic cancer and one of his sisters had breast cancer.  The patient's mother had breast cancer at age 62.  She had 6 brothers and 2 sisters with no history of cancer and her parents had no history of cancer.  The patient herself has one half brother, no full siblings.  GYNECOLOGIC HISTORY:  Patient's last menstrual period was 07/20/2017. Menarche: 48 years old Age at first live birth: 48 years old [place] P 2 LMP at the time of hysterectomy, Contraceptive oral contraceptives for several years with no complications HRT no  Hysterectomy? Yes, 02/03/2018 BSO?  No   SOCIAL HISTORY: (updated 10/2019)  Emily [name] is disabled because of her history of neck and shoulder surgery.  Her children are Emily [name] who lives in [place] and does odd jobs and her daughter Emily [name] who works as an Corporate treasurer in [place].  The patient has 1 grandchild.  She attends the [employer]For more than a dozen years she has lived with her significant other, Emily [name], who works in home remodeling.  He has no children of his own    ADVANCED DIRECTIVES: At the 11/02/2019 visit the patient was given the appropriate documents to complete and notarized at her discretion   HEALTH MAINTENANCE: Social History   Tobacco Use  . Smoking status: Current Every Day Smoker    Packs/day: 1.00    Years: 18.00    Pack years: 18.00    Types: Cigarettes  . Smokeless tobacco: Never Used  . Tobacco comment: [facility] smoking cessation class resource provided   Substance Use Topics  . Alcohol use: No    Alcohol/week: 0.0 standard drinks  . Drug use: No     Colonoscopy: 07/2017, repeat 10 years  PAP: Status post  hysterectomy  Bone density: n/a   Allergies  Allergen Reactions  . Other Hives    Dermabond  . Morphine And Related     Lowers BP too much    Current Outpatient Medications  Medication Sig Dispense Refill  . albuterol (PROVENTIL) (2.5 MG/3ML) 0.083% nebulizer solution USE 1 VIAL IN NEBULIZER EVERY 6 HOURS AS NEEDED FOR WHEEZING OR SHORTNESS OF BREATH 150 mL 1  . busPIRone (BUSPAR) 7.5 MG tablet Take 1 tablet by mouth twice daily 180 tablet 0  . cyclobenzaprine (FLEXERIL) 10 MG tablet Take 10 mg by mouth 3 (three) times daily as needed for muscle spasms.    . diclofenac sodium (VOLTAREN) 1 % GEL Apply 2 g topically 3 (three) times daily as needed (for pain).    Marland Kitchen dicyclomine (BENTYL) 20 MG tablet Take 20 mg by mouth 3 (three) times daily.  11  . DILT-XR 120 MG 24 hr capsule Take 1 capsule by mouth once daily 90 capsule 0  . escitalopram (LEXAPRO)  20 MG tablet Take 1 tablet (20 mg total) by mouth daily. 90 tablet 1  . famotidine (PEPCID) 20 MG tablet Take 1 tablet (20 mg total) by mouth at bedtime. 90 tablet 3  . fluticasone (FLONASE) 50 MCG/ACT nasal spray Use 2 spray(s) in each nostril once daily 16 g 0  . fluticasone (FLOVENT HFA) 110 MCG/ACT inhaler Inhale 2 puffs into the lungs 2 (two) times daily. 1 Inhaler 6  . gabapentin (NEURONTIN) 300 MG capsule Take 1 capsule by mouth daily.    Marland Kitchen HYDROcodone-acetaminophen (NORCO/VICODIN) 5-325 MG tablet Take 1 tablet by mouth every 6 (six) hours as needed for moderate pain. 15 tablet 0  . ibuprofen (ADVIL) 800 MG tablet Take 1 tablet (800 mg total) by mouth every 8 (eight) hours as needed. 30 tablet 0  . losartan (COZAAR) 50 MG tablet Take 1 tablet by mouth once daily 90 tablet 0  . meloxicam (MOBIC) 15 MG tablet Take 1 tablet by mouth once daily 60 tablet 0  . metaxalone (SKELAXIN) 800 MG tablet Take 800 mg by mouth 3 (three) times daily.    . montelukast (SINGULAIR) 10 MG tablet Take 1 tablet (10 mg total) by mouth at bedtime. 30 tablet 3  .  Olopatadine HCl 0.2 % SOLN Apply 1-2 drops to eye 2 (two) times daily as needed (itching, running eyes). 5 mL 0  . Oxycodone HCl 10 MG TABS Take 1 tablet by mouth every 8 (eight) hours as needed (for pain).     Marland Kitchen PROAIR HFA 108 (90 Base) MCG/ACT inhaler INHALE 2 PUFFS INTO LUNGS EVERY 6 HOURS AS NEEDED FOR WHEEZING OR SHORTNESS OF BREATH 18 g 2  . promethazine (PHENERGAN) 12.5 MG tablet Take 12.5 mg by mouth every 6 (six) hours as needed for nausea or vomiting.    . senna-docusate (SENNA-S) 8.6-50 MG tablet Take 1 tablet by mouth daily. 30 tablet 11  . tiZANidine (ZANAFLEX) 4 MG tablet Take 4 mg by mouth every 8 (eight) hours as needed for muscle spasms.     . varenicline (CHANTIX CONTINUING MONTH PAK) 1 MG tablet Take 1 tablet (1 mg total) by mouth 2 (two) times daily. 60 tablet 3  . varenicline (CHANTIX STARTING MONTH PAK) 0.5 MG X 11 & 1 MG X 42 tablet Take one 0.5 mg tablet by mouth once daily for 3 days, then increase to one 0.5 mg tablet twice daily for 4 days, then increase to one 1 mg tablet twice daily. 53 tablet 0   No current facility-administered medications for this visit.    OBJECTIVE: Middle-aged African-American woman in no acute distress  Vitals:   11/02/19 1625  BP: 136/77  Pulse: 78  Resp: 18  Temp: 98.7 F (37.1 C)  SpO2: 98%     Body mass index is 37.43 kg/m.   Wt Readings from Last 3 Encounters:  11/02/19 185 lb 4.8 oz (84.1 kg)  09/23/19 181 lb (82.1 kg)  12/01/18 185 lb (83.9 kg)      ECOG FS:1 - Symptomatic but completely ambulatory  Ocular: Sclerae unicteric, pupils round and equal Ear-nose-throat: Wearing a mask Lymphatic: No cervical or supraclavicular adenopathy Lungs no rales or rhonchi Heart regular rate and rhythm Abd soft, nontender, positive bowel sounds MSK no focal spinal tenderness, no joint edema Neuro: non-focal, well-oriented, appropriate affect Breasts: The right breast is status post lumpectomy, with no skin or nipple changes of  concern, no palpable masses.  The cosmetic result is excellent.  The left breast is  unremarkable.  Both axillae are benign.   LAB RESULTS:  CMP     Component Value Date/Time   NA 138 09/18/2019 1400   K 3.8 09/18/2019 1400   CL 104 09/18/2019 1400   CO2 23 09/18/2019 1400   GLUCOSE 122 (H) 09/18/2019 1400   BUN 6 09/18/2019 1400   CREATININE 0.83 09/18/2019 1400   CALCIUM 9.1 09/18/2019 1400   PROT 7.1 09/18/2019 1400   ALBUMIN 3.8 09/18/2019 1400   AST 19 09/18/2019 1400   ALT 15 09/18/2019 1400   ALKPHOS 84 09/18/2019 1400   BILITOT 0.6 09/18/2019 1400   GFRNONAA >60 09/18/2019 1400   GFRAA >60 09/18/2019 1400    No results found for: TOTALPROTELP, ALBUMINELP, A1GS, A2GS, BETS, BETA2SER, GAMS, MSPIKE, SPEI  No results found for: Nils Pyle, KAPLAMBRATIO  Lab Results  Component Value Date   WBC 9.5 09/18/2019   NEUTROABS 4.9 09/18/2019   HGB 13.3 09/18/2019   HCT 38.9 09/18/2019   MCV 89.2 09/18/2019   PLT 254 09/18/2019    @LASTCHEMISTRY @  No results found for: LABCA2  No components found for: [id number]  No results for input(s): INR in the last 168 hours.  No results found for: LABCA2  No results found for: [id number]  No results found for: [id number]  No results found for: [id number]  No results found for: CA2729  No components found for: HGQUANT  No results found for: CEA1 / No results found for: CEA1   No results found for: AFPTUMOR  No results found for: CHROMOGRNA  No results found for: PSA1  No visits with results within 3 Day(s) from this visit.  Latest known visit with results is:  Admission on 09/23/2019, Discharged on 09/23/2019  Component Date Value Ref Range Status  . WBC 09/18/2019 9.5  4.0 - 10.5 K/uL Final  . RBC 09/18/2019 4.36  3.87 - 5.11 MIL/uL Final  . Hemoglobin 09/18/2019 13.3  12.0 - 15.0 g/dL Final  . HCT 09/18/2019 38.9  36.0 - 46.0 % Final  . MCV 09/18/2019 89.2  80.0 - 100.0 fL Final  . MCH 09/18/2019 30.5   26.0 - 34.0 pg Final  . MCHC 09/18/2019 34.2  30.0 - 36.0 g/dL Final  . RDW 09/18/2019 11.3* 11.5 - 15.5 % Final  . Platelets 09/18/2019 254  150 - 400 K/uL Final  . nRBC 09/18/2019 0.0  0.0 - 0.2 % Final  . Neutrophils Relative % 09/18/2019 51  % Final  . Neutro Abs 09/18/2019 4.9  1.7 - 7.7 K/uL Final  . Lymphocytes Relative 09/18/2019 37  % Final  . Lymphs Abs 09/18/2019 3.5  0.7 - 4.0 K/uL Final  . Monocytes Relative 09/18/2019 10  % Final  . Monocytes Absolute 09/18/2019 0.9  0.1 - 1.0 K/uL Final  . Eosinophils Relative 09/18/2019 2  % Final  . Eosinophils Absolute 09/18/2019 0.2  0.0 - 0.5 K/uL Final  . Basophils Relative 09/18/2019 0  % Final  . Basophils Absolute 09/18/2019 0.0  0.0 - 0.1 K/uL Final  . Immature Granulocytes 09/18/2019 0  % Final  . Abs Immature Granulocytes 09/18/2019 0.02  0.00 - 0.07 K/uL Final   Performed at [facility], [place] [address], [place], [place] [address]  . Sodium 09/18/2019 138  135 - 145 mmol/L Final  . Potassium 09/18/2019 3.8  3.5 - 5.1 mmol/L Final  . Chloride 09/18/2019 104  98 - 111 mmol/L Final  . CO2 09/18/2019 23  22 - 32 mmol/L Final  .  Glucose, Bld 09/18/2019 122* 70 - 99 mg/dL Final  . BUN 09/18/2019 6  6 - 20 mg/dL Final  . Creatinine, Ser 09/18/2019 0.83  0.44 - 1.00 mg/dL Final  . Calcium 09/18/2019 9.1  8.9 - 10.3 mg/dL Final  . Total Protein 09/18/2019 7.1  6.5 - 8.1 g/dL Final  . Albumin 09/18/2019 3.8  3.5 - 5.0 g/dL Final  . AST 09/18/2019 19  15 - 41 U/L Final  . ALT 09/18/2019 15  0 - 44 U/L Final  . Alkaline Phosphatase 09/18/2019 84  38 - 126 U/L Final  . Total Bilirubin 09/18/2019 0.6  0.3 - 1.2 mg/dL Final  . GFR calc non Af Amer 09/18/2019 >60  >60 mL/min Final  . GFR calc Af Amer 09/18/2019 >60  >60 mL/min Final  . Anion gap 09/18/2019 11  5 - 15 Final   Performed at [facility] [address] [place], [place] [address]  . SURGICAL PATHOLOGY 09/23/2019    Final-Edited                    Value:SURGICAL PATHOLOGY CASE: [id number] PATIENT: Emily [name] Surgical Pathology Report     Clinical History: right atypical ductal hyperplasia (cm)   Seriously  FINAL MICROSCOPIC DIAGNOSIS:  A. BREAST, RIGHT, LUMPECTOMY: - Atypical ductal hyperplasia. - Lobular neoplasia (atypical lobular hyperplasia). - Fibrocystic change. - Pseudoangiomatous stromal hyperplasia ([place]). - Biopsy site.  B. BREAST, RIGHT INFERIOR MARGIN, EXCISION: - Atypical ductal hyperplasia. - Lobular neoplasia (atypical lobular hyperplasia). - Fibrocystic change. - Pseudoangiomatous stromal hyperplasia ([place]).  C. BREAST, RIGHT MEDIAL MARGIN, EXCISION: - Fibrocystic change. - Pseudoangiomatous stromal hyperplasia ([place]).  D. BREAST, RIGHT LATERAL MARGIN, EXCISION: - Atypical ductal hyperplasia. - Lobular neoplasia (atypical lobular hyperplasia). - Fibrocystic change. - Pseudoangiomatous stromal hyperplasia ([place]).  E. BREAST, RIGHT SUPERIOR MARGIN, EXCISION: - Fibrocystic cha                         nge.  F. BREAST, RIGHT ANTERIOR MARGIN, EXCISION: - Lobular neoplasia (atypical lobular hyperplasia). - Fibrocystic change. - Pseudoangiomatous stroma hyperplasia ([place]).  COMMENT:  A. Cytokeratin 5/6 is negative and E-cadherin is positive in areas of atypical ductal hyperplasia.  B. Cytokeratin 5/6 is negative and E-cadherin is positive in areas of atypical ductal hyperplasia. E-cadherin is patchy in areas of atypical lobular hyperplasia.  D. Cytokeratin 5/6 is positive and E-cadherin is patchy in areas of atypical ductal hyperplasia.   GROSS DESCRIPTION:  A: Specimen type: Right breast seed localization lumpectomy, received fresh.  The specimen is placed in formalin at 11:20 AM on 09/23/2019. Size: 4.7 cm at the superior-inferior axis, 3.5 cm at the lateral-medial axis and 2.3 cm at the anterior-posterior axis Orientation: The specimen is oriented with previously applied  inks (anterior green, inferior blue, lateral orange, medial yellow, posterior black, su                         perior red). Localized area: The localization seed is identified at the time of receipt. Cut surface: At the localized area there is a 1.2 cm ill-defined area of hemorrhage consistent with a biopsy site.  The specimen is x-rayed at the time of gross examination and a biopsy clip is not present. The remainder of the breast tissue consist of soft yellow adipose tissue and focally dense white fibrous tissue.  Margins: The margins are inked as previously described.  The localization seed  is located towards the inferior portion of the specimen. Prognostic indicators: Obtained from paraffin blocks if needed Block summary: 10 blocks submitted 1-8 = sections at and surrounding the localization seed to include the inferior, lateral, medial, anterior and posterior margins 9, 10 = sections away from the other after localized area to include the superior, anterior and posterior margins  B: Received fresh is a 3.5 x 2.5 x 1.6 cm portion of fibroadipose tissue clinical identified                          as right breast inferior margin.  One broad surface shows cautery artifact and the opposite is inked black.  The cut surfaces consist of soft yellow adipose tissue and focally dense white fibrous tissue.  The specimen is entirely submitted in 7 cassettes.  C: Received fresh is a 2.8 x 2.2 x 1.2 cm portion of fibroadipose tissue clinic identified as right breast medial margin.  One broad surface shows cautery artifact and the opposite is inked black.  The cut surfaces consist of soft yellow adipose tissue and focally dense white fibrous tissue.  The specimen is sectioned and entirely submitted in 5 cassettes.  D: Received fresh is a 4.3 x 3.1 x 1.5 cm portion of fibroadipose tissue clinically identified as right breast lateral margin.  One broad surface shows cautery artifact and the  opposite is inked black.  The cut surfaces consist of soft yellow adipose tissue and focally dense white fibrous tissue.  The specimen is sectioned and entirely submitted in 8 cassettes.                           E: Received fresh is a 2.7 x 2.1 x 1.5 cm portion of fibroadipose tissue clinic identified as right breast superior margin.  One broad surface shows cautery artifact and the opposite is inked black prior to sectioning.  The cut surfaces consist of soft yellow adipose tissue and focally dense white fibrous tissue.  The specimen is sectioned and entirely submitted in 5 cassettes.  F: Received fresh are 3 portions of fibroadipose tissue which together measure 3.2 x 2.7 x 1.5 cm.  One broad surface of each shows cautery artifact and the opposite is inked black prior to sectioning.  The cut surfaces consist of soft yellow adipose tissue and focally dense white fibrous tissue.  The specimen is sectioned and entirely submitted in 6 cassettes. [facility] 09/24/2019)  Final Diagnosis performed by Vicente Males, MD.   Electronically signed 09/28/2019 Technical component performed at [facility], [place] [address] [place], [place] [address].  Professional component perfo                         rmed at [facility], [place] [address], [place], [place] [address].  Immunohistochemistry Technical component (if applicable) was performed at [facility]. [address] [place], [place], [place] [address].   IMMUNOHISTOCHEMISTRY DISCLAIMER (if applicable): Some of these immunohistochemical stains may have been developed and the performance characteristics determine by [facility]. Some may not have been cleared or approved by the U.S. Food and Drug Administration. The FDA has determined that such clearance or approval is not necessary. This test is used for clinical purposes. It should not be regarded as investigational or for research.  This laboratory is certified under the Clinical Laboratory Improvement Amendments  of 1988 (CLIA-88) as qualified to perform high complexity clinical laboratory testing.  The controls stained appropriately.     (this displays the last labs from the last 3 days)  No results found for: TOTALPROTELP, ALBUMINELP, A1GS, A2GS, BETS, BETA2SER, GAMS, MSPIKE, SPEI (this displays SPEP labs)  No results found for: KPAFRELGTCHN, LAMBDASER, KAPLAMBRATIO (kappa/lambda light chains)  No results found for: HGBA, HGBA2QUANT, HGBFQUANT, HGBSQUAN (Hemoglobinopathy evaluation)   No results found for: LDH  Lab Results  Component Value Date   IRON 37 (L) 07/05/2017   TIBC 380 07/05/2017   IRONPCTSAT 10 (L) 07/05/2017   (Iron and TIBC)  Lab Results  Component Value Date   FERRITIN 5.3 (L) 07/05/2017    Urinalysis    Component Value Date/Time   COLORURINE AMBER (A) 09/09/2017 0506   APPEARANCEUR CLOUDY (A) 09/09/2017 0506   LABSPEC 1.017 09/09/2017 0506   PHURINE 6.0 09/09/2017 0506   GLUCOSEU NEGATIVE 09/09/2017 0506   GLUCOSEU NEGATIVE 07/25/2010 1126   HGBUR LARGE (A) 09/09/2017 0506   BILIRUBINUR NEGATIVE 09/09/2017 0506   KETONESUR NEGATIVE 09/09/2017 0506   PROTEINUR 100 (A) 09/09/2017 0506   UROBILINOGEN 0.2 07/25/2010 1126   NITRITE NEGATIVE 09/09/2017 0506   LEUKOCYTESUR SMALL (A) 09/09/2017 0506     STUDIES: No results found.  ELIGIBLE FOR AVAILABLE RESEARCH PROTOCOL: no  ASSESSMENT: 48 y.o. [place] woman status post right lumpectomy 09/23/2019 showing atypical ductal hyperplasia and atypical lobular hyperplasia  (1) genetics testing pending  (2) at high risk for breast cancer:  (a) discussed intensified screening  (b) started tamoxifen 11/03/2019  PLAN:  We spent the better part of today's 50-minute appointment discussing the biology of breast cancer in general, and the specifics of atypical lobular hyperplasia [ALH] and atypical ductal hyperplasia  [ADH]more  specifically. Pam understands atypical hyperplasia means, first, relatively unrestricted growth of glandular breast cells and, in addition, some morphologically different features from simple hyperplasia.  The words lobular and ductal referred to the milk labs (lobules) and milk tubes (ducts) where the hyperplasia is taking place.  Atypical hyperplasia is not breast cancer but can be associated with invasive cancer and for that reason lumpectomy was necessary.  More importantly, atypical hyperplasia is also a marker of breast cancer risk. The risk of developing breast cancer in patients with this condition can be as high as 3 % per year. Cancer can develop in either breast. One half of those tumors would be invasive.  In addition, other factors that increase her breast cancer risk include family history, in this case 2 relatives on the father's side and the patient's mother.  Increased breast density, in this case category C, also increases the risk of breast cancer.    There are 2 major strategies to deal with this problem.  One of them is intensified screening.  The other one is risk reduction strategies.  In terms of risk reduction, Myda can consider bilateral mastectomies.  This would involve major surgery, likely requiring reconstruction, which may or may not be successful in the patient's eyes..  It may or may not be covered by her insurance.  More importantly there is no evidence of a survival advantage with this approach.  This is discouraged in the setting  The other, more reasonable risk reduction approach would be to take anti-estrogens for 5 years.  Options include tamoxifen, raloxifene/Evista, or an aromatase inhibitor. Any of these agents if taken for 5 years would cut the risk of developing a new breast cancer in half.  Since when it is under 56, tamoxifen would be the drug of choice  We then discussed the possible toxicities, side effects and complications of the tamoxifen group as  opposed to the aromatase inhibitors.  She is interested in tamoxifen and she is a good candidate for it because she is status post hysterectomy and because she took systemic estrogens for many years with no clotting issues.  I have gone ahead and placed the prescription in for her  We then discussed intensified screening. This would mean adding a yearly MRI to yearly mammography with tomography. This approach greatly increases sensitivity. Any breast cancer that may develop should be found at the earliest possible stage.  The main concern with this approach is the possibility of "false positives " requiring repeated studies, biopsies, etc.  Also there are issues regarding cost, which are prohibitive for many patients.  At this point the patient will not be pursuing this option.  The plan then is to proceed with tamoxifen.  I am going to set the patient up for a virtual visit with me in about 68months to make sure she is tolerating it well.  If she is tolerating it well then I will see her again next November after her mammography.  Emily [name] has a good understanding of this plan.  She agrees with it.  She knows the goal of treatment in this case is prevention.  She will call with any other issues that may develop before her next visit here.  Chauncey Cruel, MD   11/02/2019 6:01 PM Medical Oncology and Hematology [facility] [place], [place] [address] Tel. [phone]    Fax. [phone]   This document serves as a record of services personally performed by Lurline Del, MD. It was created on his behalf by Wilburn Mylar, a trained medical scribe. The creation of this record is based on the scribe's personal observations and the provider's statements to them.   I, Lurline Del MD, have reviewed the above documentation for accuracy and completeness, and I agree with the above.

## 2019-11-03 ENCOUNTER — Telehealth: Payer: Self-pay | Admitting: Oncology

## 2019-11-03 NOTE — Telephone Encounter (Signed)
I talk with patient regarding video visit °

## 2019-11-20 HISTORY — PX: BREAST LUMPECTOMY: SHX2

## 2019-12-02 DIAGNOSIS — M5030 Other cervical disc degeneration, unspecified cervical region: Secondary | ICD-10-CM | POA: Diagnosis not present

## 2019-12-02 DIAGNOSIS — M47816 Spondylosis without myelopathy or radiculopathy, lumbar region: Secondary | ICD-10-CM | POA: Diagnosis not present

## 2019-12-02 DIAGNOSIS — M5136 Other intervertebral disc degeneration, lumbar region: Secondary | ICD-10-CM | POA: Diagnosis not present

## 2019-12-02 DIAGNOSIS — M5412 Radiculopathy, cervical region: Secondary | ICD-10-CM | POA: Diagnosis not present

## 2019-12-04 ENCOUNTER — Other Ambulatory Visit: Payer: Self-pay | Admitting: Podiatry

## 2019-12-04 ENCOUNTER — Other Ambulatory Visit: Payer: Self-pay | Admitting: Internal Medicine

## 2019-12-08 ENCOUNTER — Other Ambulatory Visit: Payer: Self-pay | Admitting: Internal Medicine

## 2019-12-08 ENCOUNTER — Other Ambulatory Visit: Payer: Self-pay | Admitting: Podiatry

## 2019-12-16 DIAGNOSIS — I10 Essential (primary) hypertension: Secondary | ICD-10-CM | POA: Diagnosis not present

## 2019-12-16 DIAGNOSIS — M47816 Spondylosis without myelopathy or radiculopathy, lumbar region: Secondary | ICD-10-CM | POA: Diagnosis not present

## 2019-12-16 DIAGNOSIS — Z79899 Other long term (current) drug therapy: Secondary | ICD-10-CM | POA: Diagnosis not present

## 2019-12-30 DIAGNOSIS — M47817 Spondylosis without myelopathy or radiculopathy, lumbosacral region: Secondary | ICD-10-CM | POA: Diagnosis not present

## 2019-12-30 DIAGNOSIS — Z7951 Long term (current) use of inhaled steroids: Secondary | ICD-10-CM | POA: Diagnosis not present

## 2019-12-30 DIAGNOSIS — J45909 Unspecified asthma, uncomplicated: Secondary | ICD-10-CM | POA: Diagnosis not present

## 2019-12-30 DIAGNOSIS — I10 Essential (primary) hypertension: Secondary | ICD-10-CM | POA: Diagnosis not present

## 2019-12-30 DIAGNOSIS — M47816 Spondylosis without myelopathy or radiculopathy, lumbar region: Secondary | ICD-10-CM | POA: Diagnosis not present

## 2019-12-30 DIAGNOSIS — Z79899 Other long term (current) drug therapy: Secondary | ICD-10-CM | POA: Diagnosis not present

## 2020-01-11 NOTE — Progress Notes (Signed)
[place]  Telephone:([phone] Fax:([phone]) [phone]     ID: Emily [name] DOB: January 20, 1971  MR#: [id number]  [id number]  Patient Care Team: Hoyt Koch, MD as PCP - General (Internal Medicine) Zonia Kief, MD (Rehabilitation) Renato Shin, MD (Endocrinology) Brien Few, MD (Obstetrics and Gynecology) Laray Anger, DO (Anesthesiology) Erroll Luna, MD as Consulting Physician (General Surgery) Chauncey Cruel, MD OTHER MD:  I connected with [name] on 01/12/20 at 10:30 AM EST by video enabled telemedicine visit and verified that I am speaking with the correct person using two identifiers.   I discussed the limitations, risks, security and privacy concerns of performing an evaluation and management service by telemedicine and the availability of in-person appointments. I also discussed with the patient that there may be a patient responsible charge related to this service. The patient expressed understanding and agreed to proceed.   Other persons participating in the visit and their role in the encounter: None  Patient's location: home  Provider's location: [place]: high risk for breast cancer, atypical ductal hyperplasia  CURRENT TREATMENT: tamoxifen   INTERVAL HISTORY: Emily [name] was evaluated today for follow up of her high risk for breast cancer.  Interval history is unremarkable.  She occasionally has shooting pains associated with her right-sided breast surgery but these are rare and she has no other symptoms of concern to her.  Supposedly she was started on tamoxifen at her last visit.  However she has not started that medication.  Since her last visit, she underwent bilateral lumbar medical branch block (MBB) and radiofrequency ablation (RFA) under Dr. Asa Lente. The right was performed on 12/16/2019 and the left on 12/30/2019.   REVIEW OF SYSTEMS: A detailed review of  systems today was otherwise stable   HISTORY OF CURRENT ILLNESS: From the original intake note:  Emily [name] has a history of benign cysts in the bilateral breasts since 2016. She presented with pinprick pain in the left breast at a site of previous cysts. She underwent bilateral diagnostic mammography with tomography and left breast ultrasonography at [facility] on 08/10/2019 showing: breast density category C; no cause for patient's left-sided symptoms; new right breast calcifications.  Accordingly on 08/12/2019 she proceeded to biopsy of the right breast area in question. The pathology from this procedure ([id number]) showed: atypical ductal hyperplasia with calcifications.  She proceeded to right lumpectomy on 09/23/2019 under Dr. Brantley Stage. Pathology from the procedure ([id number]) showed: atypical ductal hyperplasia; lobular neoplasia; fibrocystic change; pseudoangiomatous stromal hyperplasia.  The patient's subsequent history is as detailed below.   PAST MEDICAL HISTORY: Past Medical History:  Diagnosis Date  . Allergic rhinitis   . Anal fissure   . Anemia   . Anxiety   . Arthritis   . Asthma    uses inhaler as needed  . Chronic neck pain    pain mgmt in [place]  . Depression   . Gallbladder disease   . GERD (gastroesophageal reflux disease)   . Headache(784.0)   . HTN (hypertension)    BP has been normal recently per pt and not taking meds  . Hyperthyroidism   . Obesity   . PONV (postoperative nausea and vomiting)   . Thyroid nodule    incidental on MRI neck 2014, s/p endo eval  . Uterine fibroid     PAST SURGICAL HISTORY: Past Surgical History:  Procedure Laterality Date  . BREAST LUMPECTOMY WITH RADIOACTIVE SEED LOCALIZATION Right 09/23/2019  Procedure: RIGHT BREAST LUMPECTOMY WITH RADIOACTIVE SEED LOCALIZATION;  Surgeon: Erroll Luna, MD;  Location: [facility];  Service: General;  Laterality: Right;  . CERVICAL SPINE SURGERY      2 disc removed  . [place] Malformation  2003  . CHOLECYSTECTOMY  1993  . DILITATION & CURRETTAGE/HYSTROSCOPY WITH NOVASURE ABLATION N/A 03/15/2014   Procedure: DILATATION & CURETTAGE/HYSTEROSCOPY WITH NOVASURE ABLATION;  Surgeon: Lovenia Kim, MD;  Location: [facility];  Service: Gynecology;  Laterality: N/A;  . Laberal tear  2001   Right Shoulder  . LAPAROSCOPIC TOTAL HYSTERECTOMY  02/03/2018  . 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 WISDOM TOOTH EXTRACTION  1992    FAMILY HISTORY: Family History  Problem Relation Age of Onset  . Breast cancer Mother        over 51 when dx  . Diverticulitis Mother   . Arthritis Mother   . Hyperlipidemia Mother   . Hypertension Mother   . Pancreatic cancer Paternal Grandmother   . Breast cancer Paternal Aunt        x 2 pat aunts  . Irritable bowel syndrome Daughter   . Hypotension Daughter   . Alcohol abuse Son   . Alcohol abuse Paternal Uncle   . Alcohol abuse Maternal Uncle        x several  . Colon cancer Other        maternal grandmothers sister   As of December 2020 the patient's father is 57 years old and her mother 74.  The patient's father's mother had pancreatic cancer and one of his sisters had breast cancer.  The patient's mother had breast cancer at age 10.  She had 6 brothers and 2 sisters with no history of cancer and her parents had no history of cancer.  The patient herself has one half brother, no full siblings.   GYNECOLOGIC HISTORY:  Patient's last menstrual period was 07/20/2017. Menarche: 49 years old Age at first live birth: 49 years old [place] P 2 LMP at the time of hysterectomy, Contraceptive oral contraceptives for several years with no complications HRT no  Hysterectomy? Yes, 02/03/2018 BSO?  No   SOCIAL HISTORY: (updated 10/2019)  Emily [name] is disabled because of her history of neck and shoulder surgery.  Her children are Emily [name] who lives in [place] and does odd jobs and her  daughter Emily [name] who works as an Corporate treasurer in [place].  The patient has 1 grandchild.  She attends the [employer]For more than a dozen years she has lived with her significant other, Emily [name], who works in home remodeling.  He has no children of his own    ADVANCED DIRECTIVES: At the 11/02/2019 visit the patient was given the appropriate documents to complete and notarized at her discretion   HEALTH MAINTENANCE: Social History   Tobacco Use  . Smoking status: Current Every Day Smoker    Packs/day: 1.00    Years: 18.00    Pack years: 18.00    Types: Cigarettes  . Smokeless tobacco: Never Used  . Tobacco comment: [facility] smoking cessation class resource provided   Substance Use Topics  . Alcohol use: No    Alcohol/week: 0.0 standard drinks  . Drug use: No     Colonoscopy: 07/2017, repeat 10 years  PAP: Status post hysterectomy  Bone density: n/a   Allergies  Allergen Reactions  . Other Hives    Dermabond  . Morphine And Related  Lowers BP too much    Current Outpatient Medications  Medication Sig Dispense Refill  . albuterol (PROVENTIL) (2.5 MG/3ML) 0.083% nebulizer solution USE 1 VIAL IN NEBULIZER EVERY 6 HOURS AS NEEDED FOR WHEEZING OR SHORTNESS OF BREATH 150 mL 1  . busPIRone (BUSPAR) 7.5 MG tablet Take 1 tablet by mouth twice daily 180 tablet 0  . cyclobenzaprine (FLEXERIL) 10 MG tablet Take 10 mg by mouth 3 (three) times daily as needed for muscle spasms.    . diclofenac sodium (VOLTAREN) 1 % GEL Apply 2 g topically 3 (three) times daily as needed (for pain).    Marland Kitchen dicyclomine (BENTYL) 20 MG tablet Take 20 mg by mouth 3 (three) times daily.  11  . DILT-XR 120 MG 24 hr capsule Take 1 capsule by mouth once daily 90 capsule 0  . escitalopram (LEXAPRO) 20 MG tablet Take 1 tablet (20 mg total) by mouth daily. 90 tablet 1  . famotidine (PEPCID) 20 MG tablet TAKE 1 TABLET BY MOUTH AT BEDTIME 90 tablet 0  . fluticasone (FLONASE) 50 MCG/ACT nasal spray Use 2 spray(s)  in each nostril once daily 16 g 0  . fluticasone (FLOVENT HFA) 110 MCG/ACT inhaler Inhale 2 puffs into the lungs 2 (two) times daily. 1 Inhaler 6  . gabapentin (NEURONTIN) 300 MG capsule Take 1 capsule by mouth daily.    Marland Kitchen HYDROcodone-acetaminophen (NORCO/VICODIN) 5-325 MG tablet Take 1 tablet by mouth every 6 (six) hours as needed for moderate pain. 15 tablet 0  . ibuprofen (ADVIL) 800 MG tablet Take 1 tablet (800 mg total) by mouth every 8 (eight) hours as needed. 30 tablet 0  . losartan (COZAAR) 50 MG tablet Take 1 tablet by mouth once daily 90 tablet 0  . meloxicam (MOBIC) 15 MG tablet Take 1 tablet by mouth once daily 60 tablet 0  . metaxalone (SKELAXIN) 800 MG tablet Take 800 mg by mouth 3 (three) times daily.    . montelukast (SINGULAIR) 10 MG tablet Take 1 tablet (10 mg total) by mouth at bedtime. 30 tablet 3  . Olopatadine HCl 0.2 % SOLN Apply 1-2 drops to eye 2 (two) times daily as needed (itching, running eyes). 5 mL 0  . Oxycodone HCl 10 MG TABS Take 1 tablet by mouth every 8 (eight) hours as needed (for pain).     Marland Kitchen PROAIR HFA 108 (90 Base) MCG/ACT inhaler INHALE 2 PUFFS INTO LUNGS EVERY 6 HOURS AS NEEDED FOR WHEEZING OR SHORTNESS OF BREATH 18 g 2  . promethazine (PHENERGAN) 12.5 MG tablet Take 12.5 mg by mouth every 6 (six) hours as needed for nausea or vomiting.    . senna-docusate (SENNA-S) 8.6-50 MG tablet Take 1 tablet by mouth daily. 30 tablet 11  . tamoxifen (NOLVADEX) 20 MG tablet Take 1 tablet (20 mg total) by mouth daily. 90 tablet 4  . tiZANidine (ZANAFLEX) 4 MG tablet Take 4 mg by mouth every 8 (eight) hours as needed for muscle spasms.     . varenicline (CHANTIX CONTINUING MONTH PAK) 1 MG tablet Take 1 tablet (1 mg total) by mouth 2 (two) times daily. 60 tablet 3  . varenicline (CHANTIX STARTING MONTH PAK) 0.5 MG X 11 & 1 MG X 42 tablet Take one 0.5 mg tablet by mouth once daily for 3 days, then increase to one 0.5 mg tablet twice daily for 4 days, then increase to one 1 mg  tablet twice daily. 53 tablet 0   No current facility-administered medications for this visit.  OBJECTIVE: Middle-aged African-American woman who appears stated age  There were no vitals filed for this visit.   There is no height or weight on file to calculate BMI.   Wt Readings from Last 3 Encounters:  11/02/19 185 lb 4.8 oz (84.1 kg)  09/23/19 181 lb (82.1 kg)  12/01/18 185 lb (83.9 kg)      ECOG FS:1 - Symptomatic but completely ambulatory  Televisit    LAB RESULTS:  CMP     Component Value Date/Time   NA 138 09/18/2019 1400   K 3.8 09/18/2019 1400   CL 104 09/18/2019 1400   CO2 23 09/18/2019 1400   GLUCOSE 122 (H) 09/18/2019 1400   BUN 6 09/18/2019 1400   CREATININE 0.83 09/18/2019 1400   CALCIUM 9.1 09/18/2019 1400   PROT 7.1 09/18/2019 1400   ALBUMIN 3.8 09/18/2019 1400   AST 19 09/18/2019 1400   ALT 15 09/18/2019 1400   ALKPHOS 84 09/18/2019 1400   BILITOT 0.6 09/18/2019 1400   GFRNONAA >60 09/18/2019 1400   GFRAA >60 09/18/2019 1400    No results found for: TOTALPROTELP, ALBUMINELP, A1GS, A2GS, BETS, BETA2SER, GAMS, MSPIKE, SPEI  No results found for: Nils Pyle, KAPLAMBRATIO  Lab Results  Component Value Date   WBC 9.5 09/18/2019   NEUTROABS 4.9 09/18/2019   HGB 13.3 09/18/2019   HCT 38.9 09/18/2019   MCV 89.2 09/18/2019   PLT 254 09/18/2019   No results found for: LABCA2  No components found for: [id number]  No results for input(s): INR in the last 168 hours.  No results found for: LABCA2  No results found for: [id number]  No results found for: [id number]  No results found for: [id number]  No results found for: CA2729  No components found for: HGQUANT  No results found for: CEA1 / No results found for: CEA1   No results found for: AFPTUMOR  No results found for: CHROMOGRNA  No results found for: HGBA, HGBA2QUANT, HGBFQUANT, HGBSQUAN (Hemoglobinopathy evaluation)   No results found for: LDH  Lab Results  Component Value  Date   IRON 37 (L) 07/05/2017   TIBC 380 07/05/2017   IRONPCTSAT 10 (L) 07/05/2017   (Iron and TIBC)  Lab Results  Component Value Date   FERRITIN 5.3 (L) 07/05/2017    Urinalysis    Component Value Date/Time   COLORURINE AMBER (A) 09/09/2017 0506   APPEARANCEUR CLOUDY (A) 09/09/2017 0506   LABSPEC 1.017 09/09/2017 0506   PHURINE 6.0 09/09/2017 0506   GLUCOSEU NEGATIVE 09/09/2017 0506   GLUCOSEU NEGATIVE 07/25/2010 1126   HGBUR LARGE (A) 09/09/2017 0506   BILIRUBINUR NEGATIVE 09/09/2017 0506   KETONESUR NEGATIVE 09/09/2017 0506   PROTEINUR 100 (A) 09/09/2017 0506   UROBILINOGEN 0.2 07/25/2010 1126   NITRITE NEGATIVE 09/09/2017 0506   LEUKOCYTESUR SMALL (A) 09/09/2017 0506    STUDIES: No results found.    ELIGIBLE FOR AVAILABLE RESEARCH PROTOCOL: no  ASSESSMENT: 49 y.o. [place] woman status post right lumpectomy 09/23/2019 showing atypical ductal hyperplasia and atypical lobular hyperplasia  (1) genetics testing pending  (2) at high risk for breast cancer:  (a) discussed intensified screening  (b) started tamoxifen 01/11/2020   (i) status post hysterectomy   (ii) took oral contraceptives for many years with no clotting complications  PLAN: I reviewed outlining the situation with her and she understands she does not have breast cancer but is at increased risk of developing breast cancer.  We again discussed intensified screening.  We then went into antiestrogens.  She would be a good candidate for tamoxifen since she is status post hysterectomy.  She also took oral contraceptives for almost 2 decades with no clotting complications.  We discussed the possible toxicities side effects and complications of this agent in detail.  She is willing to start.  I have put in the prescription.  She will call if she has any problems.  Otherwise I will see her again in early May  She knows to call for any other issue that may develop before the next visit.  Total encounter  time 20 minutes.Chauncey Cruel, MD   01/12/2020 10:48 AM Medical Oncology and Hematology [facility] [place], [place] [address] Tel. [phone]    Fax. [phone]   This document serves as a record of services personally performed by Lurline Del, MD. It was created on his behalf by Wilburn Mylar, a trained medical scribe. The creation of this record is based on the scribe's personal observations and the provider's statements to them.   I, Lurline Del MD, have reviewed the above documentation for accuracy and completeness, and I agree with the above.   *Total Encounter Time as defined by the Centers for Medicare and Medicaid Services includes, in addition to the face-to-face time of a patient visit (documented in the note above) non-face-to-face time: obtaining and reviewing outside history, ordering and reviewing medications, tests or procedures, care coordination (communications with other health care professionals or caregivers) and documentation in the medical record.

## 2020-01-12 ENCOUNTER — Inpatient Hospital Stay: Payer: Medicare Other | Attending: Oncology | Admitting: Oncology

## 2020-01-12 DIAGNOSIS — F418 Other specified anxiety disorders: Secondary | ICD-10-CM

## 2020-01-12 DIAGNOSIS — N6091 Unspecified benign mammary dysplasia of right breast: Secondary | ICD-10-CM

## 2020-01-12 DIAGNOSIS — I10 Essential (primary) hypertension: Secondary | ICD-10-CM | POA: Diagnosis not present

## 2020-01-12 DIAGNOSIS — Z9189 Other specified personal risk factors, not elsewhere classified: Secondary | ICD-10-CM | POA: Diagnosis not present

## 2020-01-12 DIAGNOSIS — Z720 Tobacco use: Secondary | ICD-10-CM | POA: Diagnosis not present

## 2020-01-12 DIAGNOSIS — N6092 Unspecified benign mammary dysplasia of left breast: Secondary | ICD-10-CM

## 2020-01-12 DIAGNOSIS — Z1239 Encounter for other screening for malignant neoplasm of breast: Secondary | ICD-10-CM | POA: Diagnosis not present

## 2020-01-12 MED ORDER — TAMOXIFEN CITRATE 20 MG PO TABS: 20.0000 mg | ORAL_TABLET | Freq: Every day | ORAL | 4 refills | Status: DC

## 2020-01-15 DIAGNOSIS — Z09 Encounter for follow-up examination after completed treatment for conditions other than malignant neoplasm: Secondary | ICD-10-CM | POA: Diagnosis not present

## 2020-01-15 DIAGNOSIS — Z9189 Other specified personal risk factors, not elsewhere classified: Secondary | ICD-10-CM | POA: Diagnosis not present

## 2020-02-02 DIAGNOSIS — M5030 Other cervical disc degeneration, unspecified cervical region: Secondary | ICD-10-CM | POA: Diagnosis not present

## 2020-02-02 DIAGNOSIS — M5412 Radiculopathy, cervical region: Secondary | ICD-10-CM | POA: Diagnosis not present

## 2020-02-02 DIAGNOSIS — M47816 Spondylosis without myelopathy or radiculopathy, lumbar region: Secondary | ICD-10-CM | POA: Diagnosis not present

## 2020-02-02 DIAGNOSIS — M5136 Other intervertebral disc degeneration, lumbar region: Secondary | ICD-10-CM | POA: Diagnosis not present

## 2020-02-08 ENCOUNTER — Ambulatory Visit (INDEPENDENT_AMBULATORY_CARE_PROVIDER_SITE_OTHER): Payer: Medicare Other | Admitting: Family

## 2020-02-08 DIAGNOSIS — J45909 Unspecified asthma, uncomplicated: Secondary | ICD-10-CM | POA: Diagnosis not present

## 2020-02-08 MED ORDER — PREDNISONE 20 MG PO TABS: 40.0000 mg | ORAL_TABLET | Freq: Every day | ORAL | 0 refills | Status: DC

## 2020-02-08 MED ORDER — MONTELUKAST SODIUM 10 MG PO TABS: 10.0000 mg | ORAL_TABLET | Freq: Every day | ORAL | 3 refills | Status: DC

## 2020-02-08 MED ORDER — AMOXICILLIN-POT CLAVULANATE 875-125 MG PO TABS: 1.0000 | ORAL_TABLET | Freq: Two times a day (BID) | ORAL | 0 refills | Status: AC

## 2020-02-08 MED ORDER — LORATADINE 10 MG PO TABS: 10.0000 mg | ORAL_TABLET | Freq: Every day | ORAL | 3 refills | Status: DC

## 2020-02-08 NOTE — Progress Notes (Signed)
Emily [name] is a 49 y.o. female with the following history as recorded in EpicCare:  Patient Active Problem List   Diagnosis Date Noted  . Tobacco abuse 09/30/2019  . Constipation due to opioid therapy 09/30/2019  . Breast cancer screening, high risk patient 09/24/2019  . At high risk for breast cancer 09/24/2019  . 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 Left foot pain 06/17/2018  . Anxiety state 05/06/2017  . Iron deficiency anemia 12/16/2015  . Pain in left wrist 10/05/2015  . Hemorrhoids 06/14/2015  . Abdominal distension (gaseous) 06/14/2015  . Trigger index finger of right hand 05/17/2015  . Encounter for general adult medical examination with abnormal findings 05/12/2015  . Bilateral carpal tunnel syndrome 03/29/2015  . Left thyroid nodule   . Back pain, lumbosacral   . Depression with anxiety 03/22/2010  . MIGRAINE HEADACHE 03/22/2010  . Essential hypertension 03/22/2010  . Allergic rhinitis 03/22/2010  . Asthma 03/22/2010  . GERD 03/22/2010  . ARTHRITIS, SHOULDER 03/22/2010  . Major depressive disorder, single episode 03/22/2010    Current Outpatient Medications  Medication Sig Dispense Refill  . albuterol (PROVENTIL) (2.5 MG/3ML) 0.083% nebulizer solution USE 1 VIAL IN NEBULIZER EVERY 6 HOURS AS NEEDED FOR WHEEZING OR SHORTNESS OF BREATH 150 mL 1  . amoxicillin-clavulanate (AUGMENTIN) 875-125 MG tablet Take 1 tablet by mouth 2 (two) times daily for 10 days. 20 tablet 0  . busPIRone (BUSPAR) 7.5 MG tablet Take 1 tablet by mouth twice daily 180 tablet 0  . cyclobenzaprine (FLEXERIL) 10 MG tablet Take 10 mg by mouth 3 (three) times daily as needed for muscle spasms.    . diclofenac sodium (VOLTAREN) 1 % GEL Apply 2 g topically 3 (three) times daily as needed (for pain).    Marland Kitchen dicyclomine (BENTYL) 20 MG tablet Take 20 mg by mouth 3 (three) times daily.  11  . DILT-XR 120 MG 24 hr capsule Take 1 capsule by mouth once daily 90 capsule 0   . escitalopram (LEXAPRO) 20 MG tablet Take 1 tablet (20 mg total) by mouth daily. 90 tablet 1  . famotidine (PEPCID) 20 MG tablet TAKE 1 TABLET BY MOUTH AT BEDTIME 90 tablet 0  . fluticasone (FLONASE) 50 MCG/ACT nasal spray Use 2 spray(s) in each nostril once daily 16 g 0  . fluticasone (FLOVENT HFA) 110 MCG/ACT inhaler Inhale 2 puffs into the lungs 2 (two) times daily. 1 Inhaler 6  . gabapentin (NEURONTIN) 300 MG capsule Take 1 capsule by mouth daily.    Marland Kitchen HYDROcodone-acetaminophen (NORCO/VICODIN) 5-325 MG tablet Take 1 tablet by mouth every 6 (six) hours as needed for moderate pain. 15 tablet 0  . ibuprofen (ADVIL) 800 MG tablet Take 1 tablet (800 mg total) by mouth every 8 (eight) hours as needed. 30 tablet 0  . loratadine (CLARITIN) 10 MG tablet Take 1 tablet (10 mg total) by mouth daily. 30 tablet 3  . losartan (COZAAR) 50 MG tablet Take 1 tablet by mouth once daily 90 tablet 0  . meloxicam (MOBIC) 15 MG tablet Take 1 tablet by mouth once daily 60 tablet 0  . metaxalone (SKELAXIN) 800 MG tablet Take 800 mg by mouth 3 (three) times daily.    . montelukast (SINGULAIR) 10 MG tablet Take 1 tablet (10 mg total) by mouth at bedtime. 30 tablet 3  . Olopatadine HCl 0.2 % SOLN Apply 1-2 drops to eye 2 (two) times daily as needed (itching, running eyes). 5 mL 0  . Oxycodone  HCl 10 MG TABS Take 1 tablet by mouth every 8 (eight) hours as needed (for pain).     . predniSONE (DELTASONE) 20 MG tablet Take 2 tablets (40 mg total) by mouth daily with breakfast. 10 tablet 0  . PROAIR HFA 108 (90 Base) MCG/ACT inhaler INHALE 2 PUFFS INTO LUNGS EVERY 6 HOURS AS NEEDED FOR WHEEZING OR SHORTNESS OF BREATH 18 g 2  . promethazine (PHENERGAN) 12.5 MG tablet Take 12.5 mg by mouth every 6 (six) hours as needed for nausea or vomiting.    . senna-docusate (SENNA-S) 8.6-50 MG tablet Take 1 tablet by mouth daily. 30 tablet 11  . tamoxifen (NOLVADEX) 20 MG tablet Take 1 tablet (20 mg total) by mouth daily. 90 tablet 4  .  tiZANidine (ZANAFLEX) 4 MG tablet Take 4 mg by mouth every 8 (eight) hours as needed for muscle spasms.     . varenicline (CHANTIX CONTINUING MONTH PAK) 1 MG tablet Take 1 tablet (1 mg total) by mouth 2 (two) times daily. 60 tablet 3  . varenicline (CHANTIX STARTING MONTH PAK) 0.5 MG X 11 & 1 MG X 42 tablet Take one 0.5 mg tablet by mouth once daily for 3 days, then increase to one 0.5 mg tablet twice daily for 4 days, then increase to one 1 mg tablet twice daily. 53 tablet 0   No current facility-administered medications for this visit.    Allergies: Other and Morphine and related  Past Medical History:  Diagnosis Date  . Allergic rhinitis   . Anal fissure   . Anemia   . Anxiety   . Arthritis   . Asthma    uses inhaler as needed  . Chronic neck pain    pain mgmt in [place]  . Depression   . Gallbladder disease   . GERD (gastroesophageal reflux disease)   . Headache(784.0)   . HTN (hypertension)    BP has been normal recently per pt and not taking meds  . Hyperthyroidism   . Obesity   . PONV (postoperative nausea and vomiting)   . Thyroid nodule    incidental on MRI neck 2014, s/p endo eval  . Uterine fibroid     Past Surgical History:  Procedure Laterality Date  . BREAST LUMPECTOMY WITH RADIOACTIVE SEED LOCALIZATION Right 09/23/2019   Procedure: RIGHT BREAST LUMPECTOMY WITH RADIOACTIVE SEED LOCALIZATION;  Surgeon: Erroll Luna, MD;  Location: [facility];  Service: General;  Laterality: Right;  . CERVICAL SPINE SURGERY     2 disc removed  . [place] Malformation  2003  . CHOLECYSTECTOMY  1993  . DILITATION & CURRETTAGE/HYSTROSCOPY WITH NOVASURE ABLATION N/A 03/15/2014   Procedure: DILATATION & CURETTAGE/HYSTEROSCOPY WITH NOVASURE ABLATION;  Surgeon: Lovenia Kim, MD;  Location: [facility];  Service: Gynecology;  Laterality: N/A;  . Laberal tear  2001   Right Shoulder  . LAPAROSCOPIC TOTAL HYSTERECTOMY  02/03/2018  . MOUTH SURGERY     fissal malformation ( tooth  root in her sinus)  . WISDOM TOOTH EXTRACTION  1992    Family History  Problem Relation Age of Onset  . Breast cancer Mother        over 97 when dx  . Diverticulitis Mother   . Arthritis Mother   . Hyperlipidemia Mother   . Hypertension Mother   . Pancreatic cancer Paternal Grandmother   . Breast cancer Paternal Aunt        x 2 pat aunts  . Irritable bowel syndrome Daughter   .  Hypotension Daughter   . Alcohol abuse Son   . Alcohol abuse Paternal Uncle   . Alcohol abuse Maternal Uncle        x several  . Colon cancer Other        maternal grandmothers sister    Social History   Tobacco Use  . Smoking status: Current Every Day Smoker    Packs/day: 1.00    Years: 18.00    Pack years: 18.00    Types: Cigarettes  . Smokeless tobacco: Never Used  . Tobacco comment: [facility] smoking cessation class resource provided   Substance Use Topics  . Alcohol use: No    Alcohol/week: 0.0 standard drinks    Subjective:   I connected with Emily [name] on 02/08/20 at  3:00 PM EDT by a video enabled telemedicine application and verified that I am speaking with the correct person using two identifiers.   I discussed the limitations of evaluation and management by telemedicine and the availability of in person appointments. The patient expressed understanding and agreed to proceed. Provider in office/ patient is at home; provider and patient are only 2 people on video call.   Concerned for bronchitis/ asthma flare; started last Thursday; having to use albuterol at least 6-7 x per day; has not been using her daily preventive asthma medication; notes that symptoms are worse at night;    Objective:  There were no vitals filed for this visit.  General: Well developed, well nourished, in no acute distress  Skin : Warm and dry.  Head: Normocephalic and atraumatic  Lungs: Respirations unlabored; cough with wheeze noted; Neurologic: Alert and oriented; speech intact; face symmetrical;  moves all extremities well; CNII-XII intact without focal deficit   Assessment:  1. Acute asthmatic bronchitis     Plan:  Patient will switch to nebulizer 4 x per day for the next 2-3 days; re-start Flovent and use daily for at least remainder of allergy season;  Rx for Prednisone and Augmentin- use as directed; increase fluids, rest and follow-up worse, no better.  No follow-ups on file.  No orders of the defined types were placed in this encounter.   Requested Prescriptions   Signed Prescriptions Disp Refills  . montelukast (SINGULAIR) 10 MG tablet 30 tablet 3    Sig: Take 1 tablet (10 mg total) by mouth at bedtime.  Marland Kitchen loratadine (CLARITIN) 10 MG tablet 30 tablet 3    Sig: Take 1 tablet (10 mg total) by mouth daily.  Marland Kitchen amoxicillin-clavulanate (AUGMENTIN) 875-125 MG tablet 20 tablet 0    Sig: Take 1 tablet by mouth 2 (two) times daily for 10 days.  . predniSONE (DELTASONE) 20 MG tablet 10 tablet 0    Sig: Take 2 tablets (40 mg total) by mouth daily with breakfast.

## 2020-02-17 ENCOUNTER — Ambulatory Visit (INDEPENDENT_AMBULATORY_CARE_PROVIDER_SITE_OTHER): Payer: Medicare Other | Admitting: Internal Medicine

## 2020-02-17 VITALS — BP 120/70 | HR 100 | Temp 97.1°F | Resp 16 | Ht 59.0 in | Wt 182.4 lb

## 2020-02-17 DIAGNOSIS — J40 Bronchitis, not specified as acute or chronic: Secondary | ICD-10-CM

## 2020-02-17 MED ORDER — DM-GUAIFENESIN ER 30-600 MG PO TB12: 1.0000 | ORAL_TABLET | Freq: Two times a day (BID) | ORAL | 0 refills | Status: DC

## 2020-02-17 MED ORDER — BENZONATATE 100 MG PO CAPS: 100.0000 mg | ORAL_CAPSULE | Freq: Three times a day (TID) | ORAL | 0 refills | Status: DC | PRN

## 2020-02-17 NOTE — Progress Notes (Signed)
Respiratory Clinic Note   Patient has f/u from televisit with PCP. Patient was diagnosed with acute asthmatic bronchitis on 3/22. She was prescribed Prednisone and Augmentin. She is still completing Augmentin course. Reports that she felt better until finished steroids. She is having cough that she feels like she can't break up or get anything up. She also endorses postnasal drip, wheezing, headache, fatigue. Afebrile at home. Denies body aches and chills. These symptoms have been occurring almost 2 weeks.   Positive review of systems for Covid infection are documented above in HPI.   Physical exam:  Vitals:   02/17/20 1810  BP: 120/70  Pulse: 100  Resp: 16  Temp: (!) 97.1 F (36.2 C)  SpO2: 93%   General appearance: Adequately nourished; no acute distress, increased work of breathing is present.   Lymphatic: No lymphadenopathy about the head, neck, axilla. Eyes: No conjunctival inflammation or lid edema is present. There is no scleral icterus. Ears:  External ear exam shows no significant lesions or deformities.   Nose:  External nasal examination shows no deformity or inflammation. Nasal mucosa are pink and moist without lesions, exudates Oral exam:  Lips and gums are healthy appearing. There is no oropharyngeal erythema or exudate. Neck:  No thyromegaly, masses, tenderness noted.    Heart:  Normal rate and regular rhythm. S1 and S2 normal without gallop, murmur, click, rub .  Lungs: Chest clear to auscultation without wheezes, rhonchi, rales, rubs. Occasional dry cough. Speaking in full sentences without SOB. No accessory muscle use.  Abdomen: Bowel sounds are normal. Abdomen is soft and nontender with no organomegaly, hernias, masses. GU: Deferred  Extremities:  No cyanosis, clubbing, edema  Neurologic exam :Grossly intact. Normal gait.  Skin: Warm & dry w/o tenting. No significant lesions or rash.  1. Bronchitis Improving. Lung exam is benign. Will add cough suppressant and  decongestant. Follow up with PCP prn. Return precautions discussed.  - benzonatate (TESSALON) 100 MG capsule; Take 1 capsule (100 mg total) by mouth 3 (three) times daily as needed for cough.  Dispense: 30 capsule; Refill: 0 - dextromethorphan-guaiFENesin (MUCINEX DM) 30-600 MG 12hr tablet; Take 1 tablet by mouth 2 (two) times daily.  Dispense: 30 tablet; Refill: 0   Phill Myron, D.O. 02/17/2020, 6:11 PM

## 2020-03-24 ENCOUNTER — Other Ambulatory Visit: Payer: Self-pay | Admitting: Surgery

## 2020-03-24 DIAGNOSIS — Z9189 Other specified personal risk factors, not elsewhere classified: Secondary | ICD-10-CM

## 2020-03-30 DIAGNOSIS — Z79899 Other long term (current) drug therapy: Secondary | ICD-10-CM | POA: Diagnosis not present

## 2020-03-30 DIAGNOSIS — Z5181 Encounter for therapeutic drug level monitoring: Secondary | ICD-10-CM | POA: Diagnosis not present

## 2020-03-30 DIAGNOSIS — M5412 Radiculopathy, cervical region: Secondary | ICD-10-CM | POA: Diagnosis not present

## 2020-03-30 DIAGNOSIS — G8929 Other chronic pain: Secondary | ICD-10-CM | POA: Diagnosis not present

## 2020-04-10 ENCOUNTER — Other Ambulatory Visit: Payer: Self-pay | Admitting: Internal Medicine

## 2020-04-10 DIAGNOSIS — F418 Other specified anxiety disorders: Secondary | ICD-10-CM

## 2020-04-16 ENCOUNTER — Other Ambulatory Visit: Payer: Self-pay | Admitting: Family

## 2020-04-22 ENCOUNTER — Ambulatory Visit
Admission: RE | Admit: 2020-04-22 | Discharge: 2020-04-22 | Disposition: A | Discharge status: Home / Self Care | Payer: Medicare Other | Source: Ambulatory Visit | Attending: Surgery | Admitting: Surgery

## 2020-04-22 ENCOUNTER — Other Ambulatory Visit: Payer: Self-pay

## 2020-04-22 DIAGNOSIS — Z9189 Other specified personal risk factors, not elsewhere classified: Secondary | ICD-10-CM

## 2020-04-22 DIAGNOSIS — Z803 Family history of malignant neoplasm of breast: Secondary | ICD-10-CM | POA: Diagnosis not present

## 2020-04-22 DIAGNOSIS — N6313 Unspecified lump in the right breast, lower outer quadrant: Secondary | ICD-10-CM | POA: Diagnosis not present

## 2020-04-22 MED ORDER — GADOBUTROL 1 MMOL/ML IV SOLN
9.0000 mL | Freq: Once | INTRAVENOUS | Status: AC | PRN
  Administered 2020-04-22: 9 mL via INTRAVENOUS

## 2020-05-03 ENCOUNTER — Other Ambulatory Visit: Payer: Self-pay | Admitting: Surgery

## 2020-05-03 DIAGNOSIS — R9389 Abnormal findings on diagnostic imaging of other specified body structures: Secondary | ICD-10-CM

## 2020-05-09 ENCOUNTER — Ambulatory Visit
Admission: RE | Admit: 2020-05-09 | Discharge: 2020-05-09 | Disposition: A | Discharge status: Home / Self Care | Payer: Medicare Other | Source: Ambulatory Visit | Attending: Surgery | Admitting: Surgery

## 2020-05-09 ENCOUNTER — Other Ambulatory Visit: Payer: Self-pay

## 2020-05-09 ENCOUNTER — Other Ambulatory Visit (HOSPITAL_COMMUNITY): Payer: Self-pay | Admitting: Diagnostic Radiology

## 2020-05-09 DIAGNOSIS — N6313 Unspecified lump in the right breast, lower outer quadrant: Secondary | ICD-10-CM | POA: Diagnosis not present

## 2020-05-09 DIAGNOSIS — R9389 Abnormal findings on diagnostic imaging of other specified body structures: Secondary | ICD-10-CM

## 2020-05-09 DIAGNOSIS — N6341 Unspecified lump in right breast, subareolar: Secondary | ICD-10-CM | POA: Diagnosis not present

## 2020-05-15 ENCOUNTER — Other Ambulatory Visit: Payer: Self-pay | Admitting: Internal Medicine

## 2020-05-22 ENCOUNTER — Other Ambulatory Visit: Payer: Self-pay | Admitting: Internal Medicine

## 2020-05-30 DIAGNOSIS — M7918 Myalgia, other site: Secondary | ICD-10-CM | POA: Diagnosis not present

## 2020-05-30 DIAGNOSIS — M5136 Other intervertebral disc degeneration, lumbar region: Secondary | ICD-10-CM | POA: Diagnosis not present

## 2020-05-30 DIAGNOSIS — M5412 Radiculopathy, cervical region: Secondary | ICD-10-CM | POA: Diagnosis not present

## 2020-05-30 DIAGNOSIS — M5030 Other cervical disc degeneration, unspecified cervical region: Secondary | ICD-10-CM | POA: Diagnosis not present

## 2020-05-31 ENCOUNTER — Ambulatory Visit: Payer: Self-pay | Admitting: Surgery

## 2020-05-31 DIAGNOSIS — Z9189 Other specified personal risk factors, not elsewhere classified: Secondary | ICD-10-CM | POA: Diagnosis not present

## 2020-05-31 DIAGNOSIS — N6310 Unspecified lump in the right breast, unspecified quadrant: Secondary | ICD-10-CM

## 2020-05-31 DIAGNOSIS — N6081 Other benign mammary dysplasias of right breast: Secondary | ICD-10-CM | POA: Diagnosis not present

## 2020-05-31 NOTE — H&P (Signed)
Emily [name] Appointment: 05/31/2020 3:30 PM Location: [facility] Patient #: [id number] DOB: Oct 01, 1971 Married / Language: English / Race: Black or African American Female  History of Present Illness Emily [name] A. Emily Awan MD; 05/31/2020 5:41 PM) Patient words: Patient returns for follow-up for high risk for breast cancer. She underwent an magnetic resonance imaging which showed an area in her right breast 6 mm in size which was 3.8 cm anterior from her previous lumpectomy for atypical ductal hyperplasia 6 months ago. 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 She is here today to discuss lumpectomy given her history. She has no other complaints today. She has numerous questions about her high risk condition which we talked about today. She was agreeable to start tamoxifen but has not yet done so.  Diagnosis Breast, right, needle core biopsy, central - DUCTAL PROLIFERATION - SEE COMMENT Microscopic Comment There is a focus of epithelial proliferation which is positive for E-cadherin but negative for cytokeratin 5/6. 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 Drs. Emily [name] reviewed the case and agree with the above diagnosis. These results were called to [facility] on May 11, 2020. Emily Sheller MD Pathologist, [facility] [employer].  The patient is a 49 year old female.   Allergies [facility] Teressa [name], CMA; 05/31/2020 3:40 PM) Morphine Derivatives Allergies Reconciled  Medication History (Emily [name], CMA; 05/31/2020 3:43 PM) Montelukast Sodium (10MG  Tablet, Oral) Active. busPIRone HCl (7.5MG  Tablet, Oral) Active. Dilt-XR (120MG  Capsule ER 24HR, Oral) Active. Escitalopram Oxalate (20MG  Tablet, Oral) Active. Fluticasone Propionate (50MCG/ACT Suspension,  Nasal) Active. Losartan Potassium (50MG  Tablet, Oral) Active. Procto-Med HC (2.5% Cream, External) Active. Loratadine (10MG  Tablet, Oral) Active. Albuterol Sulfate ((2.5 MG/3ML)0.083% Nebulized Soln, Inhalation) Active. Cyclobenzaprine HCl (10MG  Tablet, Oral) Active. Diclofenac Sodium (1% Gel, Transdermal) Active. tiZANidine HCl (4MG  Tablet, Oral) Active. Methocarbamol (Oral) Specific strength unknown - Active. oxyCODONE HCl (10MG  Tablet, Oral) Active. Famotidine (20MG  Tablet, Oral) Active. Dicyclomine HCl (Oral) Specific strength unknown - Active. Olopatadine HCl (Ophthalmic) Specific strength unknown - Active. Medications Reconciled    Vitals (Emily [name] CMA; 05/31/2020 3:43 PM) 05/31/2020 3:43 PM Weight: 179.13 lb Height: 59in Body Surface Area: 1.76 m Body Mass Index: 36.18 kg/m  Temp.: 97.45F  Pulse: 96 (Regular)         Physical Exam (Emily [name] A. Honour Schwieger MD; 05/31/2020 5:43 PM)  General Mental Status-Alert. General Appearance-Consistent with stated age. Hydration-Well hydrated. Voice-Normal.  Breast Note: Right breast scar noted. No other masses. No nipple discharge. Left breast is normal. Significant bilateral ptosis  Neurologic Neurologic evaluation reveals -alert and oriented x 3 with no impairment of recent or remote memory. Mental Status-Normal.  Musculoskeletal Normal Exam - Left-Upper Extremity Strength Normal and Lower Extremity Strength Normal. Normal Exam - Right-Upper Extremity Strength Normal and Lower Extremity Strength Normal.  Lymphatic Head & Neck  General Head & Neck Lymphatics: Bilateral - Description - Normal. Axillary  General Axillary Region: Bilateral - Description - Normal. Tenderness - Non Tender.    Assessment & Plan (Emily [name] A. 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  Brantley [name]. On exam, she does have a moderate-sized seroma. I was able to aspirate 55 cc of thin, red fluid from the cavity. She states she felt immediate relief. She will continue  to apply compression to the area. She is aware that she may need multiple aspirations if the area becomes symptomatic, but otherwise, we recommend allowing her body to take care of the fluid on its own. She will keep her follow-up appointment with Dr. Brantley [name] as planned.   BREAST MASS, RIGHT (N63.10) Impression: Recommend right breast she localized lumpectomy. Also discussed prophylactic bilateral mastectomies with reconstruction. Given her significant increased risk of breast cancer this would be an option for her especially since she has had a biopsy 6 months ago and said change. This may represent present before we discussed risk reducing surgery is possible adjunct to her versus tamoxifen. We discussed reconstruction as well. Lifetime risk over 20% for her and she is under the age of 16  She would like to pursue lumpectomy for now will think about her risk reducing mastectomy. Referral to plastic surgery to discuss possible reconstruction options if she decides to go that route. Risk of lumpectomy include bleeding, infection, seroma, more surgery, use of seed/wire, wound care, cosmetic deformity and the need for other treatments, death , blood clots, death. Pt agrees to proceed.  Current Plans Pt Education - [facility] Free Text Education/Instructions: discussed with patient and provided information.  AT HIGH RISK FOR BREAST CANCER (Z91.89) Impression: Patient will follow up with oncology. She will come back here in one year. Discussed the role of chemotherapy prevention, magnetic resonance imaging use and weight reduction as well as alcohol cessation in smoking cessation if applicable. Return in 1 year  total time 20 minutes

## 2020-06-03 ENCOUNTER — Other Ambulatory Visit: Payer: Self-pay | Admitting: Surgery

## 2020-06-03 DIAGNOSIS — E041 Nontoxic single thyroid nodule: Secondary | ICD-10-CM

## 2020-06-15 ENCOUNTER — Other Ambulatory Visit: Payer: Self-pay

## 2020-06-15 ENCOUNTER — Ambulatory Visit
Admission: RE | Admit: 2020-06-15 | Discharge: 2020-06-15 | Disposition: A | Discharge status: Home / Self Care | Payer: Medicare Other | Source: Ambulatory Visit | Attending: Surgery | Admitting: Surgery

## 2020-06-15 DIAGNOSIS — E041 Nontoxic single thyroid nodule: Secondary | ICD-10-CM | POA: Diagnosis not present

## 2020-06-27 DIAGNOSIS — M5412 Radiculopathy, cervical region: Secondary | ICD-10-CM | POA: Diagnosis not present

## 2020-06-30 DIAGNOSIS — Z803 Family history of malignant neoplasm of breast: Secondary | ICD-10-CM | POA: Diagnosis not present

## 2020-06-30 DIAGNOSIS — Z9189 Other specified personal risk factors, not elsewhere classified: Secondary | ICD-10-CM | POA: Diagnosis not present

## 2020-06-30 DIAGNOSIS — N6459 Other signs and symptoms in breast: Secondary | ICD-10-CM | POA: Diagnosis not present

## 2020-06-30 DIAGNOSIS — N6091 Unspecified benign mammary dysplasia of right breast: Secondary | ICD-10-CM | POA: Diagnosis not present

## 2020-07-12 ENCOUNTER — Telehealth: Payer: Self-pay | Admitting: Internal Medicine

## 2020-07-12 DIAGNOSIS — D0510 Intraductal carcinoma in situ of unspecified breast: Secondary | ICD-10-CM | POA: Diagnosis not present

## 2020-07-12 DIAGNOSIS — Z803 Family history of malignant neoplasm of breast: Secondary | ICD-10-CM | POA: Diagnosis not present

## 2020-07-12 DIAGNOSIS — Z800 Family history of malignant neoplasm of digestive organs: Secondary | ICD-10-CM | POA: Diagnosis not present

## 2020-07-12 DIAGNOSIS — N6091 Unspecified benign mammary dysplasia of right breast: Secondary | ICD-10-CM | POA: Diagnosis not present

## 2020-07-12 NOTE — Telephone Encounter (Signed)
Cancelled patient appointment, she will be recovering from breast surgery on 9.17.21 and will not be able to attend.  Please call her at a later date to reschedule

## 2020-07-13 DIAGNOSIS — R799 Abnormal finding of blood chemistry, unspecified: Secondary | ICD-10-CM | POA: Diagnosis not present

## 2020-07-13 DIAGNOSIS — K219 Gastro-esophageal reflux disease without esophagitis: Secondary | ICD-10-CM | POA: Diagnosis not present

## 2020-07-13 DIAGNOSIS — E041 Nontoxic single thyroid nodule: Secondary | ICD-10-CM | POA: Diagnosis not present

## 2020-07-13 DIAGNOSIS — I10 Essential (primary) hypertension: Secondary | ICD-10-CM | POA: Diagnosis not present

## 2020-07-13 DIAGNOSIS — R6889 Other general symptoms and signs: Secondary | ICD-10-CM | POA: Diagnosis not present

## 2020-07-14 ENCOUNTER — Ambulatory Visit: Payer: Medicare Other | Attending: Surgery | Admitting: Physical Therapy

## 2020-07-14 ENCOUNTER — Other Ambulatory Visit: Payer: Self-pay

## 2020-07-14 DIAGNOSIS — R293 Abnormal posture: Secondary | ICD-10-CM | POA: Insufficient documentation

## 2020-07-14 DIAGNOSIS — C50911 Malignant neoplasm of unspecified site of right female breast: Secondary | ICD-10-CM | POA: Diagnosis not present

## 2020-07-14 NOTE — Therapy (Signed)
[place], [place], [address] Phone: [phone]   Fax:  [phone]  Physical Therapy Evaluation  Patient Details  Name: Emily [name] MRN: [id number] Date of Birth: 08/13/1971 Referring Provider (PT): Dr Jackson Latino   Encounter Date: 07/14/2020   PT End of Session - 07/14/20 1720    Visit Number 1    Number of Visits 2    Date for PT Re-Evaluation 09/19/20    PT Start Time 9242    PT Stop Time 1100    PT Time Calculation (min) 45 min    Activity Tolerance Patient tolerated treatment well    Behavior During Therapy [facility] for tasks assessed/performed           Past Medical History:  Diagnosis Date  . Allergic rhinitis   . Anal fissure   . Anemia   . Anxiety   . Arthritis   . Asthma    uses inhaler as needed  . Chronic neck pain    pain mgmt in [place]  . Depression   . Gallbladder disease   . GERD (gastroesophageal reflux disease)   . Headache(784.0)   . HTN (hypertension)    BP has been normal recently per pt and not taking meds  . Hyperthyroidism   . Obesity   . PONV (postoperative nausea and vomiting)   . Thyroid nodule    incidental on MRI neck 2014, s/p endo eval  . Uterine fibroid     Past Surgical History:  Procedure Laterality Date  . BREAST LUMPECTOMY WITH RADIOACTIVE SEED LOCALIZATION Right 09/23/2019   Procedure: RIGHT BREAST LUMPECTOMY WITH RADIOACTIVE SEED LOCALIZATION;  Surgeon: Erroll Luna, MD;  Location: [facility];  Service: General;  Laterality: Right;  . CERVICAL SPINE SURGERY     2 disc removed  . [place] Malformation  2003  . CHOLECYSTECTOMY  1993  . DILITATION & CURRETTAGE/HYSTROSCOPY WITH NOVASURE ABLATION N/A 03/15/2014   Procedure: DILATATION & CURETTAGE/HYSTEROSCOPY WITH NOVASURE ABLATION;  Surgeon: Lovenia Kim, MD;  Location: [facility];  Service: Gynecology;  Laterality: N/A;  . Laberal tear  2001   Right Shoulder  . LAPAROSCOPIC TOTAL  HYSTERECTOMY  02/03/2018  . 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 WISDOM TOOTH EXTRACTION  1992    There were no vitals filed for this visit.    Subjective Assessment - 07/14/20 1023    Subjective Pt plans to have surgery on her breast next week .  She is also enrolled in [facility] through [name] because she wants to "get her life back"    Pertinent History right breast cancer with lumpectomy 09/23/2019 followed by 3 aspirations for seroma.  No chemo or radiation.  Recurrance of breast cancer in right breast.  Past history include cervical spine fusion November 24 2013, chiari malformation surgery in 2003, right shoulder surgery for labral tear Oct. 9 2001.  Pt also has back pain treated with radiofrequency ablation and steroid shots.  She also had had carpal tunnel surgery in both hands and trigger finger surgery on right index finger    Patient Stated Goals to learn what she needs to know for after surgery    Currently in Pain? Yes    Pain Score 4     Pain Location Back    Pain Orientation Lower    Pain Descriptors / Indicators Aching;Shooting    Pain Type Chronic pain    Pain Radiating  Towards down left leg    Pain Onset More than a month ago    Pain Frequency Constant   constant this past week   Aggravating Factors  walking, standing, sitting    Pain Relieving Factors rest , heat, ice, medication    Effect of Pain on Daily Activities affects everything              [facility] PT Assessment - 07/14/20 0001      Assessment   Medical Diagnosis right breast cancer     Referring Provider (PT) Dr Jackson Latino    Onset Date/Surgical Date 09/23/19    Hand Dominance Right      Precautions   Precautions Other (comment)    Precaution Comments at risk for lymphedema       Restrictions   Weight Bearing Restrictions No      Balance Screen   Has the patient fallen in the past 6 months No    Has the patient had a decrease in activity level because of a fear of  falling?  No    Is the patient reluctant to leave their home because of a fear of falling?  No      Home Social worker Private residence    Living Arrangements Spouse/significant other;Children    Available Help at Discharge Available PRN/intermittently      Prior Function   Level of [place] On disability    Leisure pt tries to [place] the yard and household chores as her body allows her .  walks her elderly dog       Cognition   Overall Cognitive Status Within Functional Limits for tasks assessed      Observation/Other Assessments   Observations Pt walks in to PT without device, she appears to move slowly with sit to stand due to back pain    Skin Integrity pt pre-op assessment , did not assess skin    Quick DASH  20.45      Sensation   Light Touch Not tested      Coordination   Gross Motor Movements are Fluid and Coordinated Yes      Posture/Postural Control   Posture/Postural Control Postural limitations    Postural Limitations Rounded Shoulders;Increased thoracic kyphosis      ROM / Strength   AROM / PROM / Strength AROM;Strength      AROM   Overall AROM  Deficits    AROM Assessment Site Shoulder;Cervical    Right Shoulder Extension 55 Degrees    Right Shoulder Flexion 150 Degrees    Right Shoulder ABduction 170 Degrees    Right Shoulder Internal Rotation 35 Degrees    Right Shoulder External Rotation 95 Degrees    Left Shoulder Extension 60 Degrees    Left Shoulder Flexion 160 Degrees    Left Shoulder ABduction 170 Degrees    Left Shoulder Internal Rotation 55 Degrees    Left Shoulder External Rotation 95 Degrees    Cervical Flexion 75%    Cervical Extension 75%    Cervical - Right Side Bend 75%    Cervical - Left Side Bend 75%    Cervical - Right Rotation 75%   pt demonstrated crepitus with neck rotation   Cervical - Left Rotation 75%      Strength   Overall Strength Within functional limits for tasks performed              LYMPHEDEMA/ONCOLOGY QUESTIONNAIRE - 07/14/20 0001  Lymphedema Assessments   Lymphedema Assessments Upper extremities      Right Upper Extremity Lymphedema   10 cm Proximal to Olecranon Process 33.5 cm    Olecranon Process 27 cm    15 cm Proximal to Ulnar Styloid Process 28.8 cm    Just Proximal to Ulnar Styloid Process 16 cm    Across Hand at [employer] 20 cm    At [place] of 2nd Digit 6.5 cm      Left Upper Extremity Lymphedema   10 cm Proximal to Olecranon Process 31.7 cm    Olecranon Process 27 cm    15 cm Proximal to Ulnar Styloid Process 27.7 cm    Just Proximal to Ulnar Styloid Process 15.8 cm    Across Hand at [employer] 20 cm    At [place] of 2nd Digit 6.5 cm           L-DEX FLOWSHEETS - 07/14/20 1300      L-DEX LYMPHEDEMA SCREENING   Measurement Type Unilateral    L-DEX MEASUREMENT EXTREMITY Upper Extremity    POSITION  Standing    DOMINANT SIDE Right    At Risk Side Right    BASELINE SCORE (UNILATERAL) 0.2    BASELINE RIGHT 0.2                Quick Dash - 07/14/20 0001    Open a tight or new jar Mild difficulty    Do heavy household chores (wash walls, wash floors) Moderate difficulty    Carry a shopping bag or briefcase Moderate difficulty    Wash your back Mild difficulty    Use a knife to cut food No difficulty    Recreational activities in which you take some force or impact through your arm, shoulder, or hand (golf, hammering, tennis) Moderate difficulty    During the past week, to what extent has your arm, shoulder or hand problem interfered with your normal social activities with family, friends, neighbors, or groups? Not at all    During the past week, to what extent has your arm, shoulder or hand problem limited your work or other regular daily activities Not at all    Arm, shoulder, or hand pain. None    Tingling (pins and needles) in your arm, shoulder, or hand Mild    Difficulty Sleeping No difficulty    DASH Score  20.45 %            Objective measurements completed on examination: See above findings.               PT Education - 07/14/20 1720    Education Details post op instructions as above    Person(s) Educated Patient    Methods Explanation;Handout    Comprehension Verbalized understanding                Breast Clinic Goals - 07/14/20 1727      Patient will be able to verbalize understanding of pertinent lymphedema risk reduction practices relevant to her diagnosis specifically related to skin care.   Period Days    Status Achieved      Patient will be able to return demonstrate and/or verbalize understanding of the post-op home exercise program related to regaining shoulder range of motion.   Time 1    Period Days    Status Achieved      Patient will be able to verbalize understanding of the importance of attending the postoperative After Breast Cancer Class for  further lymphedema risk reduction education and therapeutic exercise.   Status Achieved                 Plan - 07/14/20 1721    Clinical Impression Statement Pt comes to PT for pre-op baseline measurement of shoulder ROM and circumference as well as instruction in post op exercise and information about ABC class for lymphedema risk reduction.  She also received a baseline SOZO measurement and was scheduled for a 3 month lymphedema surveillance measurement    Personal Factors and Comorbidities Comorbidity 3+    Comorbidities previous lumpectomy, right shoulder labral tear, neck fusion, chronic back pain, obesity    Examination-Activity Limitations Sit;Stairs;Squat;Stand;Locomotion Level    Stability/Clinical Decision Making Stable/Uncomplicated    Clinical Decision Making Low    Rehab Potential Good    PT Frequency One time visit    PT Treatment/Interventions ADLs/Self Care Home Management;Therapeutic exercise;Therapeutic activities;Patient/family education    PT Next Visit Plan Reassess and  develop post op treatment plan    Consulted and Agree with Plan of Care Patient           Patient will benefit from skilled therapeutic intervention in order to improve the following deficits and impairments:  Decreased knowledge of precautions, Decreased knowledge of use of DME, Postural dysfunction, Pain, Obesity  Visit Diagnosis: Abnormal posture     Problem List Patient Active Problem List   Diagnosis Date Noted  . Tobacco abuse 09/30/2019  . Constipation due to opioid therapy 09/30/2019  . Breast cancer screening, high risk patient 09/24/2019  . At high risk for breast cancer 09/24/2019  . 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 Left foot pain 06/17/2018  . Anxiety state 05/06/2017  . Iron deficiency anemia 12/16/2015  . Pain in left wrist 10/05/2015  . Hemorrhoids 06/14/2015  . Abdominal distension (gaseous) 06/14/2015  . Trigger index finger of right hand 05/17/2015  . Encounter for general adult medical examination with abnormal findings 05/12/2015  . Bilateral carpal tunnel syndrome 03/29/2015  . Left thyroid nodule   . Back pain, lumbosacral   . Depression with anxiety 03/22/2010  . MIGRAINE HEADACHE 03/22/2010  . Essential hypertension 03/22/2010  . Allergic rhinitis 03/22/2010  . Asthma 03/22/2010  . GERD 03/22/2010  . ARTHRITIS, SHOULDER 03/22/2010  . Major depressive disorder, single episode 03/22/2010   Donato Heinz. Owens Shark PT  Norwood Levo 07/14/2020, 5:32 PM  [place], [place], [address] Phone: [phone]   Fax:  [phone]  Name: Treniyah Lynn MRN: [id number] Date of Birth: 1971/02/23

## 2020-07-14 NOTE — Patient Instructions (Signed)

## 2020-07-18 ENCOUNTER — Encounter: Payer: Self-pay | Admitting: Internal Medicine

## 2020-07-18 ENCOUNTER — Other Ambulatory Visit (INDEPENDENT_AMBULATORY_CARE_PROVIDER_SITE_OTHER): Payer: Medicare Other

## 2020-07-18 ENCOUNTER — Other Ambulatory Visit: Payer: Self-pay | Admitting: Family

## 2020-07-18 ENCOUNTER — Other Ambulatory Visit: Payer: Self-pay

## 2020-07-18 ENCOUNTER — Ambulatory Visit (INDEPENDENT_AMBULATORY_CARE_PROVIDER_SITE_OTHER): Payer: Medicare Other | Admitting: Internal Medicine

## 2020-07-18 VITALS — BP 114/72 | HR 83 | Temp 98.1°F | Ht 59.0 in | Wt 175.0 lb

## 2020-07-18 DIAGNOSIS — J301 Allergic rhinitis due to pollen: Secondary | ICD-10-CM

## 2020-07-18 DIAGNOSIS — R739 Hyperglycemia, unspecified: Secondary | ICD-10-CM

## 2020-07-18 DIAGNOSIS — F418 Other specified anxiety disorders: Secondary | ICD-10-CM | POA: Diagnosis not present

## 2020-07-18 DIAGNOSIS — I10 Essential (primary) hypertension: Secondary | ICD-10-CM | POA: Diagnosis not present

## 2020-07-18 DIAGNOSIS — J4520 Mild intermittent asthma, uncomplicated: Secondary | ICD-10-CM

## 2020-07-18 DIAGNOSIS — Z0001 Encounter for general adult medical examination with abnormal findings: Secondary | ICD-10-CM

## 2020-07-18 DIAGNOSIS — Z1159 Encounter for screening for other viral diseases: Secondary | ICD-10-CM

## 2020-07-18 DIAGNOSIS — E041 Nontoxic single thyroid nodule: Secondary | ICD-10-CM

## 2020-07-18 DIAGNOSIS — R7303 Prediabetes: Secondary | ICD-10-CM | POA: Insufficient documentation

## 2020-07-18 DIAGNOSIS — Z23 Encounter for immunization: Secondary | ICD-10-CM | POA: Diagnosis not present

## 2020-07-18 LAB — URINALYSIS, ROUTINE W REFLEX MICROSCOPIC
Bilirubin Urine: NEGATIVE
Ketones, ur: NEGATIVE
Nitrite: NEGATIVE
Specific Gravity, Urine: 1.02 (ref 1.000–1.030)
Total Protein, Urine: NEGATIVE
Urine Glucose: NEGATIVE
Urobilinogen, UA: 0.2 (ref 0.0–1.0)
pH: 6.5 (ref 5.0–8.0)

## 2020-07-18 LAB — BASIC METABOLIC PANEL
BUN: 7 mg/dL (ref 6–23)
CO2: 22 mEq/L (ref 19–32)
Calcium: 9.6 mg/dL (ref 8.4–10.5)
Chloride: 104 mEq/L (ref 96–112)
Creatinine, Ser: 0.77 mg/dL (ref 0.40–1.20)
GFR: 96.32 mL/min (ref >60.00)
Glucose, Bld: 109 mg/dL — ABNORMAL HIGH (ref 70–99)
Potassium: 4.2 mEq/L (ref 3.5–5.1)
Sodium: 135 mEq/L (ref 135–145)

## 2020-07-18 LAB — CBC WITH DIFFERENTIAL/PLATELET
Basophils Absolute: 0.1 K/uL (ref 0.0–0.1)
Basophils Relative: 0.6 % (ref 0.0–3.0)
Eosinophils Absolute: 0.1 K/uL (ref 0.0–0.7)
Eosinophils Relative: 1.2 % (ref 0.0–5.0)
HCT: 41.3 % (ref 36.0–46.0)
Hemoglobin: 14 g/dL (ref 12.0–15.0)
Lymphocytes Relative: 32.1 % (ref 12.0–46.0)
Lymphs Abs: 3.4 K/uL (ref 0.7–4.0)
MCHC: 33.8 g/dL (ref 30.0–36.0)
MCV: 87.9 fl (ref 78.0–100.0)
Monocytes Absolute: 1 K/uL (ref 0.1–1.0)
Monocytes Relative: 9.8 % (ref 3.0–12.0)
Neutro Abs: 5.9 K/uL (ref 1.4–7.7)
Neutrophils Relative %: 56.3 % (ref 43.0–77.0)
Platelets: 280 K/uL (ref 150.0–400.0)
RBC: 4.7 Mil/uL (ref 3.87–5.11)
RDW: 12.5 % (ref 11.5–15.5)
WBC: 10.5 K/uL (ref 4.0–10.5)

## 2020-07-18 LAB — HEPATIC FUNCTION PANEL
ALT: 13 U/L (ref 0–35)
AST: 22 U/L (ref 0–37)
Albumin: 4.4 g/dL (ref 3.5–5.2)
Alkaline Phosphatase: 101 U/L (ref 39–117)
Bilirubin, Direct: 0.1 mg/dL (ref 0.0–0.3)
Total Bilirubin: 0.6 mg/dL (ref 0.2–1.2)
Total Protein: 7.8 g/dL (ref 6.0–8.3)

## 2020-07-18 LAB — LIPID PANEL
Cholesterol: 163 mg/dL (ref 0–200)
HDL: 24.4 mg/dL — ABNORMAL LOW (ref >39.00)
LDL Cholesterol: 110 mg/dL — ABNORMAL HIGH (ref 0–99)
NonHDL: 138.75
Total CHOL/HDL Ratio: 7
Triglycerides: 146 mg/dL (ref 0.0–149.0)
VLDL: 29.2 mg/dL (ref 0.0–40.0)

## 2020-07-18 LAB — HEMOGLOBIN A1C: Hgb A1c MFr Bld: 5.5 % (ref 4.6–6.5)

## 2020-07-18 LAB — TSH: TSH: 0.46 u[IU]/mL (ref 0.35–4.50)

## 2020-07-18 MED ORDER — LOSARTAN POTASSIUM 50 MG PO TABS: 50.0000 mg | ORAL_TABLET | Freq: Every day | ORAL | 1 refills | Status: DC

## 2020-07-18 MED ORDER — FLUTICASONE PROPIONATE 50 MCG/ACT NA SUSP: 2.0000 | Freq: Every day | NASAL | 1 refills | Status: DC

## 2020-07-18 MED ORDER — FLOVENT HFA 110 MCG/ACT IN AERO: 2.0000 | INHALATION_SPRAY | Freq: Two times a day (BID) | RESPIRATORY_TRACT | 1 refills | Status: DC

## 2020-07-18 MED ORDER — ESCITALOPRAM OXALATE 20 MG PO TABS: 20.0000 mg | ORAL_TABLET | Freq: Every day | ORAL | 1 refills | Status: DC

## 2020-07-18 MED ORDER — ALBUTEROL SULFATE (2.5 MG/3ML) 0.083% IN NEBU: INHALATION_SOLUTION | RESPIRATORY_TRACT | 1 refills | Status: DC

## 2020-07-18 MED ORDER — MONTELUKAST SODIUM 10 MG PO TABS: ORAL_TABLET | ORAL | 1 refills | Status: DC

## 2020-07-18 NOTE — Patient Instructions (Signed)
Health Maintenance, Female Adopting a healthy lifestyle and getting preventive care are important in promoting health and wellness. Ask your health care provider about:  The right schedule for you to have regular tests and exams.  Things you can do on your own to prevent diseases and keep yourself healthy. What should I know about diet, weight, and exercise? Eat a healthy diet   Eat a diet that includes plenty of vegetables, fruits, low-fat dairy products, and lean protein.  Do not eat a lot of foods that are high in solid fats, added sugars, or sodium. Maintain a healthy weight Body mass index (BMI) is used to identify weight problems. It estimates body fat based on height and weight. Your health care provider can help determine your BMI and help you achieve or maintain a healthy weight. Get regular exercise Get regular exercise. This is one of the most important things you can do for your health. Most adults should:  Exercise for at least 150 minutes each week. The exercise should increase your heart rate and make you sweat (moderate-intensity exercise).  Do strengthening exercises at least twice a week. This is in addition to the moderate-intensity exercise.  Spend less time sitting. Even light physical activity can be beneficial. Watch cholesterol and blood lipids Have your blood tested for lipids and cholesterol at 49 years of age, then have this test every 5 years. Have your cholesterol levels checked more often if:  Your lipid or cholesterol levels are high.  You are older than 49 years of age.  You are at high risk for heart disease. What should I know about cancer screening? Depending on your health history and family history, you may need to have cancer screening at various ages. This may include screening for:  Breast cancer.  Cervical cancer.  Colorectal cancer.  Skin cancer.  Lung cancer. What should I know about heart disease, diabetes, and high blood  pressure? Blood pressure and heart disease  High blood pressure causes heart disease and increases the risk of stroke. This is more likely to develop in people who have high blood pressure readings, are of African descent, or are overweight.  Have your blood pressure checked: ? Every 3-5 years if you are 18-39 years of age. ? Every year if you are 40 years old or older. Diabetes Have regular diabetes screenings. This checks your fasting blood sugar level. Have the screening done:  Once every three years after age 40 if you are at a normal weight and have a low risk for diabetes.  More often and at a younger age if you are overweight or have a high risk for diabetes. What should I know about preventing infection? Hepatitis B If you have a higher risk for hepatitis B, you should be screened for this virus. Talk with your health care provider to find out if you are at risk for hepatitis B infection. Hepatitis C Testing is recommended for:  Everyone born from 1945 through 1965.  Anyone with known risk factors for hepatitis C. Sexually transmitted infections (STIs)  Get screened for STIs, including gonorrhea and chlamydia, if: ? You are sexually active and are younger than 49 years of age. ? You are older than 49 years of age and your health care provider tells you that you are at risk for this type of infection. ? Your sexual activity has changed since you were last screened, and you are at increased risk for chlamydia or gonorrhea. Ask your health care provider if   you are at risk.  Ask your health care provider about whether you are at high risk for HIV. Your health care provider may recommend a prescription medicine to help prevent HIV infection. If you choose to take medicine to prevent HIV, you should first get tested for HIV. You should then be tested every 3 months for as long as you are taking the medicine. Pregnancy  If you are about to stop having your period (premenopausal) and  you may become pregnant, seek counseling before you get pregnant.  Take 400 to 800 micrograms (mcg) of folic acid every day if you become pregnant.  Ask for birth control (contraception) if you want to prevent pregnancy. Osteoporosis and menopause Osteoporosis is a disease in which the bones lose minerals and strength with aging. This can result in bone fractures. If you are 65 years old or older, or if you are at risk for osteoporosis and fractures, ask your health care provider if you should:  Be screened for bone loss.  Take a calcium or vitamin D supplement to lower your risk of fractures.  Be given hormone replacement therapy (HRT) to treat symptoms of menopause. Follow these instructions at home: Lifestyle  Do not use any products that contain nicotine or tobacco, such as cigarettes, e-cigarettes, and chewing tobacco. If you need help quitting, ask your health care provider.  Do not use street drugs.  Do not share needles.  Ask your health care provider for help if you need support or information about quitting drugs. Alcohol use  Do not drink alcohol if: ? Your health care provider tells you not to drink. ? You are pregnant, may be pregnant, or are planning to become pregnant.  If you drink alcohol: ? Limit how much you use to 0-1 drink a day. ? Limit intake if you are breastfeeding.  Be aware of how much alcohol is in your drink. In the [place] one drink equals one 12 oz bottle of beer (355 mL), one 5 oz glass of wine (148 mL), or one 1 oz glass of hard liquor (44 mL). General instructions  Schedule regular health, dental, and eye exams.  Stay current with your vaccines.  Tell your health care provider if: ? You often feel depressed. ? You have ever been abused or do not feel safe at home. Summary  Adopting a healthy lifestyle and getting preventive care are important in promoting health and wellness.  Follow your health care provider's instructions about healthy  diet, exercising, and getting tested or screened for diseases.  Follow your health care provider's instructions on monitoring your cholesterol and blood pressure. This information is not intended to replace advice given to you by your health care provider. Make sure you discuss any questions you have with your health care provider. Document Revised: 10/29/2018 Document Reviewed: 10/29/2018 Elsevier Patient Education  2020 [employer]  

## 2020-07-18 NOTE — Progress Notes (Signed)
Subjective:  [name], female    DOB: 1971-11-05  Age: 49 y.o. MRN: [id number]  CC: Annual Exam, Hypertension, Asthma, and Allergic Rhinitis   This visit occurred during the SARS-CoV-2 public health emergency.  Safety protocols were in place, including screening questions prior to the visit, additional usage of staff PPE, and extensive cleaning of exam room while observing appropriate contact time as indicated for disinfecting solutions.    HPI Emily [name] presents for a CPX.  She tells me her blood pressure has been well controlled.  She is active and denies any recent episodes of headache, blurred vision, chest pain, shortness of breath, palpitations, edema, or fatigue.  She needs preop clearance for upcoming orthopedic surgery.  Outpatient Medications Prior to Visit  Medication Sig Dispense Refill  . busPIRone (BUSPAR) 5 MG tablet Take 1 tablet (5 mg total) by mouth 2 (two) times daily. Annual appt w/labs are due in see provider for future refills 60 tablet 0  . busPIRone (BUSPAR) 7.5 MG tablet Take 1 tablet by mouth twice daily 180 tablet 0  . dextromethorphan-guaiFENesin (MUCINEX DM) 30-600 MG 12hr tablet Take 1 tablet by mouth 2 (two) times daily. 30 tablet 0  . diclofenac sodium (VOLTAREN) 1 % GEL Apply 2 g topically 3 (three) times daily as needed (for pain).    Marland Kitchen diltiazem (DILT-XR) 120 MG 24 hr capsule Take 1 capsule (120 mg total) by mouth daily. Annual appt w/labs are due in see provider for future refills 30 capsule 0  . famotidine (PEPCID) 20 MG tablet TAKE 1 TABLET BY MOUTH AT BEDTIME 90 tablet 0  . gabapentin (NEURONTIN) 300 MG capsule Take 1 capsule by mouth daily.    . Olopatadine HCl 0.2 % SOLN Apply 1-2 drops to eye 2 (two) times daily as needed (itching, running eyes). 5 mL 0  . PROAIR HFA 108 (90 Base) MCG/ACT inhaler INHALE 2 PUFFS INTO LUNGS EVERY 6 HOURS AS NEEDED FOR WHEEZING OR SHORTNESS OF BREATH 18 g 2  . PROCTO-MED HC 2.5  % rectal cream APPLY CREAM TOPICALLY RECTALLY 2 TIMES DAILY 28 g 0  . promethazine (PHENERGAN) 12.5 MG tablet Take 12.5 mg by mouth every 6 (six) hours as needed for nausea or vomiting.     . senna-docusate (SENNA-S) 8.6-50 MG tablet Take 1 tablet by mouth daily. 30 tablet 11  . tamoxifen (NOLVADEX) 20 MG tablet Take 1 tablet (20 mg total) by mouth daily. 90 tablet 4  . albuterol (PROVENTIL) (2.5 MG/3ML) 0.083% nebulizer solution USE 1 VIAL IN NEBULIZER EVERY 6 HOURS AS NEEDED FOR WHEEZING OR SHORTNESS OF BREATH 150 mL 1  . cyclobenzaprine (FLEXERIL) 10 MG tablet Take 10 mg by mouth 3 (three) times daily as needed for muscle spasms.    Marland Kitchen dicyclomine (BENTYL) 20 MG tablet Take 20 mg by mouth 3 (three) times daily.   11  . escitalopram (LEXAPRO) 20 MG tablet Take 1 tablet (20 mg total) by mouth daily. Annual appt w/labs are due in see provider for future refills 30 tablet 0  . fluticasone (FLONASE) 50 MCG/ACT nasal spray Place 2 sprays into both nostrils daily. Annual appt w/labs are due in see provider for future refills 16 g 0  . fluticasone (FLOVENT HFA) 110 MCG/ACT inhaler Inhale 2 puffs into the lungs 2 (two) times daily. 1 Inhaler 6  . HYDROcodone-acetaminophen (NORCO/VICODIN) 5-325 MG tablet Take 1 tablet by mouth every 6 (six) hours as needed for moderate pain. 15 tablet 0  .  ibuprofen (ADVIL) 800 MG tablet Take 1 tablet (800 mg total) by mouth every 8 (eight) hours as needed. 30 tablet 0  . loratadine (CLARITIN) 10 MG tablet Take 1 tablet (10 mg total) by mouth daily. 30 tablet 3  . losartan (COZAAR) 50 MG tablet Take 1 tablet (50 mg total) by mouth daily. Annual appt w/labs are due in see provider for future refills 30 tablet 0  . meloxicam (MOBIC) 15 MG tablet Take 1 tablet by mouth once daily 60 tablet 0  . metaxalone (SKELAXIN) 800 MG tablet Take 800 mg by mouth 3 (three) times daily.     . montelukast (SINGULAIR) 10 MG tablet TAKE 1 TABLET BY MOUTH EVERYDAY AT BEDTIME 90 tablet 1  .  Oxycodone HCl 10 MG TABS Take 1 tablet by mouth every 8 (eight) hours as needed (for pain).     Marland Kitchen tiZANidine (ZANAFLEX) 4 MG tablet Take 4 mg by mouth every 8 (eight) hours as needed for muscle spasms.     . varenicline (CHANTIX CONTINUING MONTH PAK) 1 MG tablet Take 1 tablet (1 mg total) by mouth 2 (two) times daily. 60 tablet 3  . varenicline (CHANTIX STARTING MONTH PAK) 0.5 MG X 11 & 1 MG X 42 tablet Take one 0.5 mg tablet by mouth once daily for 3 days, then increase to one 0.5 mg tablet twice daily for 4 days, then increase to one 1 mg tablet twice daily. 53 tablet 0  . benzonatate (TESSALON) 100 MG capsule Take 1 capsule (100 mg total) by mouth 3 (three) times daily as needed for cough. (Patient not taking: Reported on 07/18/2020) 30 capsule 0  . predniSONE (DELTASONE) 20 MG tablet Take 2 tablets (40 mg total) by mouth daily with breakfast. (Patient not taking: Reported on 07/18/2020) 10 tablet 0   No facility-administered medications prior to visit.    ROS Review of Systems  Constitutional: Negative.  Negative for appetite change, diaphoresis, fatigue and unexpected weight change.  HENT: Positive for congestion, postnasal drip and rhinorrhea. Negative for facial swelling, nosebleeds, sinus pressure, sinus pain and sore throat.   Eyes: Negative for visual disturbance.  Respiratory: Positive for wheezing (rare). Negative for cough, chest tightness and shortness of breath.   Cardiovascular: Negative for chest pain, palpitations and leg swelling.  Gastrointestinal: Negative for abdominal pain, constipation, diarrhea, nausea and vomiting.  Endocrine: Negative.   Genitourinary: Negative.   Musculoskeletal: Positive for arthralgias and back pain. Negative for myalgias.  Skin: Negative.  Negative for color change and pallor.  Hematological: Negative for adenopathy. Does not bruise/bleed easily.  Psychiatric/Behavioral: Positive for dysphoric mood. The patient is not nervous/anxious.      Objective:  BP 114/72   Pulse 83   Temp 98.1 F (36.7 C) (Oral)   Ht 4\' 11"  (1.499 m)   Wt 175 lb (79.4 kg)   LMP 07/20/2017   SpO2 95%   BMI 35.35 kg/m   BP Readings from Last 3 Encounters:  07/18/20 114/72  02/17/20 120/70  11/02/19 136/77    Wt Readings from Last 3 Encounters:  07/18/20 175 lb (79.4 kg)  02/17/20 182 lb 6.4 oz (82.7 kg)  11/02/19 185 lb 4.8 oz (84.1 kg)    Physical Exam Vitals reviewed.  Constitutional:      Appearance: Normal appearance.  HENT:     Nose: Nose normal.     Mouth/Throat:     Mouth: Mucous membranes are moist.  Eyes:     General: No scleral icterus.  Conjunctiva/sclera: Conjunctivae normal.  Cardiovascular:     Rate and Rhythm: Normal rate and regular rhythm.     Pulses: Normal pulses.     Heart sounds: No murmur heard.  No gallop.   Pulmonary:     Effort: Pulmonary effort is normal.     Breath sounds: No stridor. No wheezing, rhonchi or rales.  Abdominal:     General: Abdomen is flat.     Palpations: There is no mass.     Tenderness: There is no abdominal tenderness. There is no guarding.  Musculoskeletal:        General: Normal range of motion.     Cervical back: Neck supple.     Right lower leg: No edema.     Left lower leg: No edema.  Lymphadenopathy:     Cervical: No cervical adenopathy.  Skin:    General: Skin is warm and dry.     Coloration: Skin is not pale.  Neurological:     General: No focal deficit present.     Mental Status: She is alert.  Psychiatric:        Mood and Affect: Mood normal.        Behavior: Behavior normal.     Lab Results  Component Value Date   WBC 10.5 07/18/2020   HGB 14.0 07/18/2020   HCT 41.3 07/18/2020   PLT 280.0 07/18/2020   GLUCOSE 109 (H) 07/18/2020   CHOL 163 07/18/2020   TRIG 146.0 07/18/2020   HDL 24.40 (L) 07/18/2020   LDLDIRECT 74.0 06/14/2015   LDLCALC 110 (H) 07/18/2020   ALT 13 07/18/2020   AST 22 07/18/2020   NA 135 07/18/2020   K 4.2 07/18/2020    CL 104 07/18/2020   CREATININE 0.77 07/18/2020   BUN 7 07/18/2020   CO2 22 07/18/2020   TSH 0.46 07/18/2020   HGBA1C 5.5 07/18/2020    US THYROID  Result Date: 06/16/2020 CLINICAL DATA:  Thyroid nodule previous left thyroid nodule biopsy 10/11/2015 EXAM: THYROID ULTRASOUND TECHNIQUE: Ultrasound examination of the thyroid gland and adjacent soft tissues was performed. COMPARISON:  10/11/2015, 10/07/2015 FINDINGS: Parenchymal Echotexture: Mildly heterogenous Isthmus: 4 mm Right lobe: 5.0 x 1.8 x 2.2 cm Left lobe: 5.0 x 1.9 x 2.6 cm _________________________________________________________ Estimated total number of nodules >/= 1 cm: 2 Number of spongiform nodules >/=  2 cm not described below (TR1): 0 Number of mixed cystic and solid nodules >/= 1.5 cm not described below ([place]): 0 _________________________________________________________ Nodule # 2: Location: Right; Mid Maximum size: 1.1 cm; Other 2 dimensions: 0.9 x 0.5 cm Composition: solid/almost completely solid (2) Echogenicity: hypoechoic (2) Shape: not taller-than-wide (0) Margins: smooth (0) Echogenic foci: none (0) ACR TI-RADS total points: 4. ACR TI-RADS risk category: TR4 (4-6 points). ACR TI-RADS recommendations: *Given size (>/= 1 - 1.4 cm) and appearance, a follow-up ultrasound in 1 year should be considered based on TI-RADS criteria. _________________________________________________________ The previously biopsied left mid thyroid mixed cystic/solid nodule measures 2.5 x 2.1 x 1.5 cm, previously 3.9 x 2.4 x 1.6 cm. Correlate with prior pathology. There are additional subcentimeter right thyroid hypoechoic nodules noted all measuring 8 mm or less in size. These would not meet criteria for any biopsy or follow-up and are not fully described by TI rads criteria. Nonspecific minor thyroid heterogeneity without hypervascularity. No regional adenopathy. IMPRESSION: 1.1 cm right mid thyroid TR 4 nodule meets criteria for follow-up in 1 year.  Previously biopsied left mid thyroid mixed cystic/solid nodule measures 2.5 cm, previously  3.9 cm. Correlate with prior pathology. No new or enlarging thyroid nodule by ultrasound. The above is in keeping with the ACR TI-RADS recommendations - J Am Coll Radiol 2017;14:587-595. Electronically Signed   By: Jerilynn Mages.  Shick M.D.   On: 06/16/2020 08:22    Assessment & Plan:   Emily [name] was seen today for annual exam, hypertension, asthma and allergic rhinitis .  Diagnoses and all orders for this visit:  Essential hypertension- Her blood pressure is adequately well controlled.  Electrolytes and renal function are normal.  She has a MICA score of 0.1%.  She is cleared for surgery. -     losartan (COZAAR) 50 MG tablet; Take 1 tablet (50 mg total) by mouth daily. -     Cancel: CBC with Differential/Platelet; Future -     Cancel: Hepatic function panel; Future -     Cancel: Urinalysis, Routine w reflex microscopic; Future -     Hepatic function panel; Future -     CBC with Differential/Platelet; Future -     Urinalysis, Routine w reflex microscopic; Future  Left thyroid nodule- She is euthyroid. -     Cancel: TSH; Future -     TSH; Future  Encounter for general adult medical examination with abnormal findings- Exam completed, labs reviewed- Her ASCVD risk score is less than 15% so I did not recommend a statin for CV risk reduction, vaccines reviewed and updated, cancer screenings are up-to-date, patient education was given. -     Cancel: Lipid panel; Future -     HIV Antibody (routine testing w rflx); Future -     Lipid panel; Future  Hyperglycemia- Her A1c is normal at 5.5%. -     BASIC METABOLIC PANEL WITH GFR; Future -     Cancel: Hemoglobin A1c; Future -     Hemoglobin A1c; Future  Need for hepatitis C screening test -     Hepatitis C antibody; Future  Depression with anxiety- Will continue the current dose of Lexapro. -     escitalopram (LEXAPRO) 20 MG tablet; Take 1 tablet (20 mg total) by  mouth daily.  Mild intermittent asthma without complication- Her symptoms are well controlled with the ICS and SABA. -     albuterol (PROVENTIL) (2.5 MG/3ML) 0.083% nebulizer solution; USE 1 VIAL IN NEBULIZER EVERY 6 HOURS AS NEEDED FOR WHEEZING OR SHORTNESS OF BREATH -     fluticasone (FLOVENT HFA) 110 MCG/ACT inhaler; Inhale 2 puffs into the lungs 2 (two) times daily. -     montelukast (SINGULAIR) 10 MG tablet; TAKE 1 TABLET BY MOUTH EVERYDAY AT BEDTIME  Seasonal allergic rhinitis due to pollen- Will continue Flonase nasal spray and montelukast. -     fluticasone (FLONASE) 50 MCG/ACT nasal spray; Place 2 sprays into both nostrils daily. Annual appt w/labs are due in see provider for future refills -     montelukast (SINGULAIR) 10 MG tablet; TAKE 1 TABLET BY MOUTH EVERYDAY AT BEDTIME  Other orders -     Tdap vaccine greater than or equal to 7yo IM -     Flu Vaccine QUAD 6+ mos PF IM (Fluarix Quad PF)   I have discontinued Emily [name]'s metaxalone, tiZANidine, Oxycodone HCl, cyclobenzaprine, dicyclomine, meloxicam, ibuprofen, HYDROcodone-acetaminophen, Chantix Starting Month Pak, varenicline, predniSONE, and benzonatate. I have also changed her escitalopram and losartan. Additionally, I am having her maintain her diclofenac sodium, promethazine, gabapentin, ProAir HFA, busPIRone, Olopatadine HCl, senna-docusate, famotidine, tamoxifen, dextromethorphan-guaiFENesin, diltiazem, busPIRone, Procto-Med HC, albuterol, fluticasone, Flovent HFA,  and montelukast.  Meds ordered this encounter  Medications  . albuterol (PROVENTIL) (2.5 MG/3ML) 0.083% nebulizer solution    Sig: USE 1 VIAL IN NEBULIZER EVERY 6 HOURS AS NEEDED FOR WHEEZING OR SHORTNESS OF BREATH    Dispense:  450 mL    Refill:  1  . fluticasone (FLONASE) 50 MCG/ACT nasal spray    Sig: Place 2 sprays into both nostrils daily. Annual appt w/labs are due in see provider for future refills    Dispense:  48 g    Refill:  1  .  fluticasone (FLOVENT HFA) 110 MCG/ACT inhaler    Sig: Inhale 2 puffs into the lungs 2 (two) times daily.    Dispense:  36 g    Refill:  1  . montelukast (SINGULAIR) 10 MG tablet    Sig: TAKE 1 TABLET BY MOUTH EVERYDAY AT BEDTIME    Dispense:  90 tablet    Refill:  1  . escitalopram (LEXAPRO) 20 MG tablet    Sig: Take 1 tablet (20 mg total) by mouth daily.    Dispense:  90 tablet    Refill:  1  . losartan (COZAAR) 50 MG tablet    Sig: Take 1 tablet (50 mg total) by mouth daily.    Dispense:  90 tablet    Refill:  1   In addition to time spent on CPE, I spent 50 minutes in preparing to see the patient by review of recent labs, imaging and procedures, obtaining and reviewing separately obtained history, communicating with the patient and family or caregiver, ordering medications, tests or procedures, and documenting clinical information in the EHR including the differential Dx, treatment, and any further evaluation and other management of 1. Essential hypertension, 2. Left thyroid nodule 3. Hyperglycemia 4. Depression with anxiety 5. Mild intermittent asthma without complication 7. Seasonal allergic rhinitis due to pollen     Follow-up: Return in about 6 months (around 01/16/2021).  Scarlette Calico, MD

## 2020-07-19 DIAGNOSIS — Z803 Family history of malignant neoplasm of breast: Secondary | ICD-10-CM | POA: Diagnosis not present

## 2020-07-19 DIAGNOSIS — Z800 Family history of malignant neoplasm of digestive organs: Secondary | ICD-10-CM | POA: Diagnosis not present

## 2020-07-19 LAB — HEPATITIS C ANTIBODY
Hepatitis C Ab: NONREACTIVE
SIGNAL TO CUT-OFF: 0.01 (ref <1.00)

## 2020-07-19 LAB — HIV ANTIBODY (ROUTINE TESTING W REFLEX): HIV 1&2 Ab, 4th Generation: NONREACTIVE

## 2020-07-20 DIAGNOSIS — J45909 Unspecified asthma, uncomplicated: Secondary | ICD-10-CM | POA: Diagnosis not present

## 2020-07-20 DIAGNOSIS — Z800 Family history of malignant neoplasm of digestive organs: Secondary | ICD-10-CM | POA: Diagnosis not present

## 2020-07-20 DIAGNOSIS — Z8261 Family history of arthritis: Secondary | ICD-10-CM | POA: Diagnosis not present

## 2020-07-20 DIAGNOSIS — Z86010 Personal history of colonic polyps: Secondary | ICD-10-CM | POA: Diagnosis not present

## 2020-07-20 DIAGNOSIS — Z79899 Other long term (current) drug therapy: Secondary | ICD-10-CM | POA: Diagnosis not present

## 2020-07-20 DIAGNOSIS — Z9109 Other allergy status, other than to drugs and biological substances: Secondary | ICD-10-CM | POA: Diagnosis not present

## 2020-07-20 DIAGNOSIS — Z8249 Family history of ischemic heart disease and other diseases of the circulatory system: Secondary | ICD-10-CM | POA: Diagnosis not present

## 2020-07-20 DIAGNOSIS — Z801 Family history of malignant neoplasm of trachea, bronchus and lung: Secondary | ICD-10-CM | POA: Diagnosis not present

## 2020-07-20 DIAGNOSIS — Z9049 Acquired absence of other specified parts of digestive tract: Secondary | ICD-10-CM | POA: Diagnosis not present

## 2020-07-20 DIAGNOSIS — Z885 Allergy status to narcotic agent status: Secondary | ICD-10-CM | POA: Diagnosis not present

## 2020-07-20 DIAGNOSIS — M1990 Unspecified osteoarthritis, unspecified site: Secondary | ICD-10-CM | POA: Diagnosis not present

## 2020-07-20 DIAGNOSIS — E079 Disorder of thyroid, unspecified: Secondary | ICD-10-CM | POA: Diagnosis not present

## 2020-07-20 DIAGNOSIS — Z9889 Other specified postprocedural states: Secondary | ICD-10-CM | POA: Diagnosis not present

## 2020-07-20 DIAGNOSIS — Z8711 Personal history of peptic ulcer disease: Secondary | ICD-10-CM | POA: Diagnosis not present

## 2020-07-20 DIAGNOSIS — Z803 Family history of malignant neoplasm of breast: Secondary | ICD-10-CM | POA: Diagnosis not present

## 2020-07-20 DIAGNOSIS — Z833 Family history of diabetes mellitus: Secondary | ICD-10-CM | POA: Diagnosis not present

## 2020-07-20 DIAGNOSIS — I10 Essential (primary) hypertension: Secondary | ICD-10-CM | POA: Diagnosis not present

## 2020-07-20 DIAGNOSIS — M47816 Spondylosis without myelopathy or radiculopathy, lumbar region: Secondary | ICD-10-CM | POA: Diagnosis not present

## 2020-07-21 ENCOUNTER — Encounter: Payer: Self-pay | Admitting: Internal Medicine

## 2020-07-26 DIAGNOSIS — Z885 Allergy status to narcotic agent status: Secondary | ICD-10-CM | POA: Diagnosis not present

## 2020-07-26 DIAGNOSIS — Z7951 Long term (current) use of inhaled steroids: Secondary | ICD-10-CM | POA: Diagnosis not present

## 2020-07-26 DIAGNOSIS — M1990 Unspecified osteoarthritis, unspecified site: Secondary | ICD-10-CM | POA: Diagnosis not present

## 2020-07-26 DIAGNOSIS — Z170 Estrogen receptor positive status [ER+]: Secondary | ICD-10-CM | POA: Diagnosis not present

## 2020-07-26 DIAGNOSIS — F17200 Nicotine dependence, unspecified, uncomplicated: Secondary | ICD-10-CM | POA: Diagnosis not present

## 2020-07-26 DIAGNOSIS — I10 Essential (primary) hypertension: Secondary | ICD-10-CM | POA: Diagnosis not present

## 2020-07-26 DIAGNOSIS — Z791 Long term (current) use of non-steroidal anti-inflammatories (NSAID): Secondary | ICD-10-CM | POA: Diagnosis not present

## 2020-07-26 DIAGNOSIS — C50911 Malignant neoplasm of unspecified site of right female breast: Secondary | ICD-10-CM | POA: Diagnosis not present

## 2020-07-26 DIAGNOSIS — N6091 Unspecified benign mammary dysplasia of right breast: Secondary | ICD-10-CM | POA: Diagnosis not present

## 2020-07-26 DIAGNOSIS — Z803 Family history of malignant neoplasm of breast: Secondary | ICD-10-CM | POA: Diagnosis not present

## 2020-07-26 DIAGNOSIS — R921 Mammographic calcification found on diagnostic imaging of breast: Secondary | ICD-10-CM | POA: Diagnosis not present

## 2020-07-26 DIAGNOSIS — D0511 Intraductal carcinoma in situ of right breast: Secondary | ICD-10-CM | POA: Diagnosis not present

## 2020-07-26 DIAGNOSIS — Z800 Family history of malignant neoplasm of digestive organs: Secondary | ICD-10-CM | POA: Diagnosis not present

## 2020-07-26 DIAGNOSIS — K219 Gastro-esophageal reflux disease without esophagitis: Secondary | ICD-10-CM | POA: Diagnosis not present

## 2020-07-26 DIAGNOSIS — N6081 Other benign mammary dysplasias of right breast: Secondary | ICD-10-CM | POA: Diagnosis not present

## 2020-07-26 DIAGNOSIS — J45909 Unspecified asthma, uncomplicated: Secondary | ICD-10-CM | POA: Diagnosis not present

## 2020-07-26 DIAGNOSIS — N6011 Diffuse cystic mastopathy of right breast: Secondary | ICD-10-CM | POA: Diagnosis not present

## 2020-07-26 DIAGNOSIS — Z91048 Other nonmedicinal substance allergy status: Secondary | ICD-10-CM | POA: Diagnosis not present

## 2020-08-05 ENCOUNTER — Ambulatory Visit: Payer: Self-pay

## 2020-08-09 DIAGNOSIS — C50011 Malignant neoplasm of nipple and areola, right female breast: Secondary | ICD-10-CM | POA: Diagnosis not present

## 2020-08-17 ENCOUNTER — Encounter: Payer: Self-pay | Admitting: Internal Medicine

## 2020-08-17 ENCOUNTER — Other Ambulatory Visit: Payer: Self-pay

## 2020-08-17 ENCOUNTER — Ambulatory Visit (INDEPENDENT_AMBULATORY_CARE_PROVIDER_SITE_OTHER): Payer: Medicare Other | Admitting: Internal Medicine

## 2020-08-17 ENCOUNTER — Telehealth: Payer: Self-pay | Admitting: Internal Medicine

## 2020-08-17 VITALS — BP 116/76 | HR 94 | Temp 98.1°F | Resp 16 | Ht 59.0 in | Wt 167.0 lb

## 2020-08-17 DIAGNOSIS — J4520 Mild intermittent asthma, uncomplicated: Secondary | ICD-10-CM

## 2020-08-17 DIAGNOSIS — J301 Allergic rhinitis due to pollen: Secondary | ICD-10-CM

## 2020-08-17 DIAGNOSIS — I10 Essential (primary) hypertension: Secondary | ICD-10-CM

## 2020-08-17 MED ORDER — MONTELUKAST SODIUM 10 MG PO TABS: ORAL_TABLET | ORAL | 3 refills | Status: DC

## 2020-08-17 MED ORDER — FLUTICASONE PROPIONATE 50 MCG/ACT NA SUSP: 2.0000 | Freq: Every day | NASAL | 1 refills | Status: DC

## 2020-08-17 MED ORDER — BUSPIRONE HCL 5 MG PO TABS
5.0000 mg | ORAL_TABLET | Freq: Two times a day (BID) | ORAL | 11 refills | Status: DC
Start: 2020-08-17 — End: 2020-09-12

## 2020-08-17 MED ORDER — GABAPENTIN 300 MG PO CAPS: 300.0000 mg | ORAL_CAPSULE | Freq: Every day | ORAL | 3 refills | Status: DC

## 2020-08-17 MED ORDER — FLOVENT HFA 110 MCG/ACT IN AERO: 2.0000 | INHALATION_SPRAY | Freq: Two times a day (BID) | RESPIRATORY_TRACT | 1 refills | Status: DC

## 2020-08-17 MED ORDER — OLOPATADINE HCL 0.2 % OP SOLN: 1.0000 [drp] | Freq: Two times a day (BID) | OPHTHALMIC | 2 refills | Status: DC | PRN

## 2020-08-17 NOTE — Patient Instructions (Signed)

## 2020-08-17 NOTE — Progress Notes (Signed)
Subjective:  [name], female    DOB: September 21, 1971  Age: 49 y.o. MRN: [id number]  CC: Hypertension  This visit occurred during the SARS-CoV-2 public health emergency.  Safety protocols were in place, including screening questions prior to the visit, additional usage of staff PPE, and extensive cleaning of exam room while observing appropriate contact time as indicated for disinfecting solutions.    HPI [employer] presents for a BP check - She quit taking her antihypertensives and tells me her blood pressure has been well controlled.  She was recently diagnosed with a recurrence of breast cancer.  She is also struggling with chronic low back pain and is seeing a pain specialist and may be having back surgery soon.  History Emily [name] has a past medical history of Allergic rhinitis, Anal fissure, Anemia, Anxiety, Arthritis, Asthma, Chronic neck pain, Depression, Gallbladder disease, GERD (gastroesophageal reflux disease), Headache(784.0), HTN (hypertension), Hyperthyroidism, Obesity, PONV (postoperative nausea and vomiting), Thyroid nodule, and Uterine fibroid.   She has a past surgical history that includes Cholecystectomy (1993); Chirai Malformation (2003); Laberal tear (2001); Wisdom tooth extraction (1992); Dilatation & currettage/hysteroscopy with novasure ablation (N/A, 03/15/2014); Mouth surgery; Cervical spine surgery; Laparoscopic total hysterectomy (02/03/2018); and Breast lumpectomy with radioactive seed localization (Right, 09/23/2019).   Her family history includes Alcohol abuse in her maternal uncle, paternal uncle, and son; Arthritis in her mother; Breast cancer in her mother and paternal aunt; Colon cancer in an other family member; Diverticulitis in her mother; Hyperlipidemia in her mother; Hypertension in her mother; Hypotension in her daughter; Irritable bowel syndrome in her daughter; Pancreatic cancer in her paternal grandmother.She reports that  she has been smoking cigarettes. She has a 18.00 pack-year smoking history. She has never used smokeless tobacco. She reports that she does not drink alcohol and does not use drugs.  Outpatient Medications Prior to Visit  Medication Sig Dispense Refill  . albuterol (PROVENTIL) (2.5 MG/3ML) 0.083% nebulizer solution USE 1 VIAL IN NEBULIZER EVERY 6 HOURS AS NEEDED FOR WHEEZING OR SHORTNESS OF BREATH 450 mL 1  . busPIRone (BUSPAR) 5 MG tablet Take 1 tablet (5 mg total) by mouth 2 (two) times daily. Annual appt w/labs are due in see provider for future refills 60 tablet 0  . busPIRone (BUSPAR) 7.5 MG tablet Take 1 tablet by mouth twice daily 180 tablet 0  . diclofenac sodium (VOLTAREN) 1 % GEL Apply 2 g topically 3 (three) times daily as needed (for pain).    Marland Kitchen escitalopram (LEXAPRO) 20 MG tablet Take 1 tablet (20 mg total) by mouth daily. 90 tablet 1  . famotidine (PEPCID) 20 MG tablet TAKE 1 TABLET BY MOUTH AT BEDTIME 90 tablet 0  . fluticasone (FLONASE) 50 MCG/ACT nasal spray Place 2 sprays into both nostrils daily. Annual appt w/labs are due in see provider for future refills 48 g 1  . fluticasone (FLOVENT HFA) 110 MCG/ACT inhaler Inhale 2 puffs into the lungs 2 (two) times daily. 36 g 1  . gabapentin (NEURONTIN) 300 MG capsule Take 1 capsule by mouth daily.    Marland Kitchen loratadine (CLARITIN) 10 MG tablet TAKE 1 TABLET BY MOUTH EVERY DAY 90 tablet 1  . montelukast (SINGULAIR) 10 MG tablet TAKE 1 TABLET BY MOUTH EVERYDAY AT BEDTIME 90 tablet 1  . Olopatadine HCl 0.2 % SOLN Apply 1-2 drops to eye 2 (two) times daily as needed (itching, running eyes). 5 mL 0  . PROAIR HFA 108 (90 Base) MCG/ACT inhaler INHALE 2 PUFFS INTO  LUNGS EVERY 6 HOURS AS NEEDED FOR WHEEZING OR SHORTNESS OF BREATH 18 g 2  . PROCTO-MED HC 2.5 % rectal cream APPLY CREAM TOPICALLY RECTALLY 2 TIMES DAILY 28 g 0  . senna-docusate (SENNA-S) 8.6-50 MG tablet Take 1 tablet by mouth daily. 30 tablet 11  . tamoxifen (NOLVADEX) 20 MG tablet Take 1  tablet (20 mg total) by mouth daily. 90 tablet 4  . dextromethorphan-guaiFENesin (MUCINEX DM) 30-600 MG 12hr tablet Take 1 tablet by mouth 2 (two) times daily. 30 tablet 0  . diltiazem (DILT-XR) 120 MG 24 hr capsule Take 1 capsule (120 mg total) by mouth daily. Annual appt w/labs are due in see provider for future refills 30 capsule 0  . losartan (COZAAR) 50 MG tablet Take 1 tablet (50 mg total) by mouth daily. 90 tablet 1  . promethazine (PHENERGAN) 12.5 MG tablet Take 12.5 mg by mouth every 6 (six) hours as needed for nausea or vomiting.      No facility-administered medications prior to visit.    ROS Review of Systems  Constitutional: Negative.  Negative for diaphoresis and fatigue.  HENT: Negative.   Eyes: Negative for visual disturbance.  Respiratory: Negative for cough, chest tightness, shortness of breath and wheezing.   Cardiovascular: Negative for chest pain, palpitations and leg swelling.  Gastrointestinal: Negative for abdominal pain, constipation, diarrhea, nausea and vomiting.  Endocrine: Negative.   Genitourinary: Negative.  Negative for difficulty urinating.  Musculoskeletal: Positive for back pain.  Skin: Negative.  Negative for color change and pallor.  Neurological: Negative.  Negative for dizziness, weakness and light-headedness.  Hematological: Negative for adenopathy. Does not bruise/bleed easily.  Psychiatric/Behavioral: Negative.     Objective:  BP 116/76   Pulse 94   Temp 98.1 F (36.7 C) (Oral)   Resp 16   Ht 4\' 11"  (1.499 m)   Wt 167 lb (75.8 kg)   LMP 07/20/2017   SpO2 95%   BMI 33.73 kg/m   Physical Exam Vitals reviewed.  HENT:     Nose: Nose normal.     Mouth/Throat:     Mouth: Mucous membranes are moist.  Eyes:     General: No scleral icterus.    Conjunctiva/sclera: Conjunctivae normal.  Cardiovascular:     Rate and Rhythm: Normal rate and regular rhythm.     Heart sounds: No murmur heard.   Pulmonary:     Effort: Pulmonary effort is  normal.     Breath sounds: No stridor. No wheezing, rhonchi or rales.  Abdominal:     General: Abdomen is protuberant. Bowel sounds are normal. There is no distension.     Palpations: Abdomen is soft. There is no hepatomegaly, splenomegaly or mass.  Musculoskeletal:        General: Normal range of motion.     Cervical back: Neck supple.     Right lower leg: No edema.     Left lower leg: No edema.  Lymphadenopathy:     Cervical: No cervical adenopathy.  Skin:    General: Skin is warm and dry.  Neurological:     General: No focal deficit present.     Lab Results  Component Value Date   WBC 10.5 07/18/2020   HGB 14.0 07/18/2020   HCT 41.3 07/18/2020   PLT 280.0 07/18/2020   GLUCOSE 109 (H) 07/18/2020   CHOL 163 07/18/2020   TRIG 146.0 07/18/2020   HDL 24.40 (L) 07/18/2020   LDLDIRECT 74.0 06/14/2015   LDLCALC 110 (H) 07/18/2020   ALT  13 07/18/2020   AST 22 07/18/2020   NA 135 07/18/2020   K 4.2 07/18/2020   CL 104 07/18/2020   CREATININE 0.77 07/18/2020   BUN 7 07/18/2020   CO2 22 07/18/2020   TSH 0.46 07/18/2020   HGBA1C 5.5 07/18/2020    Assessment & Plan:   Emily [name] was seen today for hypertension.  Diagnoses and all orders for this visit:  Essential hypertension- Her blood pressure is normal.  Medical therapy is not indicated.   I have discontinued Emily [name]'s promethazine, dextromethorphan-guaiFENesin, diltiazem, and losartan. I am also having her maintain her diclofenac sodium, gabapentin, ProAir HFA, busPIRone, Olopatadine HCl, senna-docusate, famotidine, tamoxifen, busPIRone, Procto-Med HC, albuterol, fluticasone, Flovent HFA, montelukast, escitalopram, and loratadine.  No orders of the defined types were placed in this encounter.    Follow-up: No follow-ups on file.  Scarlette Calico, MD

## 2020-08-17 NOTE — Telephone Encounter (Signed)
Patient states that she needs refills on all medications. They can be sent to [id number] - [place], [place] - [place]

## 2020-08-19 DIAGNOSIS — C50911 Malignant neoplasm of unspecified site of right female breast: Secondary | ICD-10-CM | POA: Diagnosis not present

## 2020-08-19 DIAGNOSIS — L7682 Other postprocedural complications of skin and subcutaneous tissue: Secondary | ICD-10-CM | POA: Diagnosis not present

## 2020-08-19 DIAGNOSIS — M5432 Sciatica, left side: Secondary | ICD-10-CM | POA: Diagnosis not present

## 2020-08-19 DIAGNOSIS — N6489 Other specified disorders of breast: Secondary | ICD-10-CM | POA: Diagnosis not present

## 2020-08-19 DIAGNOSIS — M545 Low back pain, unspecified: Secondary | ICD-10-CM | POA: Diagnosis not present

## 2020-08-19 DIAGNOSIS — M4726 Other spondylosis with radiculopathy, lumbar region: Secondary | ICD-10-CM | POA: Diagnosis not present

## 2020-08-25 DIAGNOSIS — G894 Chronic pain syndrome: Secondary | ICD-10-CM | POA: Diagnosis not present

## 2020-08-25 DIAGNOSIS — D509 Iron deficiency anemia, unspecified: Secondary | ICD-10-CM | POA: Diagnosis not present

## 2020-08-25 DIAGNOSIS — Z79899 Other long term (current) drug therapy: Secondary | ICD-10-CM | POA: Diagnosis not present

## 2020-08-25 DIAGNOSIS — N6091 Unspecified benign mammary dysplasia of right breast: Secondary | ICD-10-CM | POA: Diagnosis not present

## 2020-08-25 DIAGNOSIS — F17210 Nicotine dependence, cigarettes, uncomplicated: Secondary | ICD-10-CM | POA: Diagnosis not present

## 2020-08-25 DIAGNOSIS — J4520 Mild intermittent asthma, uncomplicated: Secondary | ICD-10-CM | POA: Diagnosis not present

## 2020-08-25 DIAGNOSIS — I10 Essential (primary) hypertension: Secondary | ICD-10-CM | POA: Diagnosis not present

## 2020-08-25 DIAGNOSIS — C50811 Malignant neoplasm of overlapping sites of right female breast: Secondary | ICD-10-CM | POA: Diagnosis not present

## 2020-08-25 DIAGNOSIS — Z170 Estrogen receptor positive status [ER+]: Secondary | ICD-10-CM | POA: Diagnosis not present

## 2020-08-25 DIAGNOSIS — C50911 Malignant neoplasm of unspecified site of right female breast: Secondary | ICD-10-CM | POA: Diagnosis not present

## 2020-08-25 DIAGNOSIS — G43009 Migraine without aura, not intractable, without status migrainosus: Secondary | ICD-10-CM | POA: Diagnosis not present

## 2020-08-25 DIAGNOSIS — N63 Unspecified lump in unspecified breast: Secondary | ICD-10-CM | POA: Diagnosis not present

## 2020-08-25 DIAGNOSIS — K219 Gastro-esophageal reflux disease without esophagitis: Secondary | ICD-10-CM | POA: Diagnosis not present

## 2020-08-30 DIAGNOSIS — M7138 Other bursal cyst, other site: Secondary | ICD-10-CM | POA: Diagnosis not present

## 2020-08-30 DIAGNOSIS — M4727 Other spondylosis with radiculopathy, lumbosacral region: Secondary | ICD-10-CM | POA: Diagnosis not present

## 2020-08-30 DIAGNOSIS — M4726 Other spondylosis with radiculopathy, lumbar region: Secondary | ICD-10-CM | POA: Diagnosis not present

## 2020-08-30 DIAGNOSIS — M5116 Intervertebral disc disorders with radiculopathy, lumbar region: Secondary | ICD-10-CM | POA: Diagnosis not present

## 2020-08-30 DIAGNOSIS — M5117 Intervertebral disc disorders with radiculopathy, lumbosacral region: Secondary | ICD-10-CM | POA: Diagnosis not present

## 2020-09-06 DIAGNOSIS — Z79899 Other long term (current) drug therapy: Secondary | ICD-10-CM | POA: Diagnosis not present

## 2020-09-06 DIAGNOSIS — C50911 Malignant neoplasm of unspecified site of right female breast: Secondary | ICD-10-CM | POA: Diagnosis not present

## 2020-09-06 DIAGNOSIS — Z01810 Encounter for preprocedural cardiovascular examination: Secondary | ICD-10-CM | POA: Diagnosis not present

## 2020-09-06 DIAGNOSIS — Z01812 Encounter for preprocedural laboratory examination: Secondary | ICD-10-CM | POA: Diagnosis not present

## 2020-09-06 DIAGNOSIS — Z01818 Encounter for other preprocedural examination: Secondary | ICD-10-CM | POA: Diagnosis not present

## 2020-09-09 DIAGNOSIS — M4316 Spondylolisthesis, lumbar region: Secondary | ICD-10-CM | POA: Diagnosis not present

## 2020-09-09 DIAGNOSIS — M5432 Sciatica, left side: Secondary | ICD-10-CM | POA: Diagnosis not present

## 2020-09-09 DIAGNOSIS — M48062 Spinal stenosis, lumbar region with neurogenic claudication: Secondary | ICD-10-CM | POA: Diagnosis not present

## 2020-09-09 DIAGNOSIS — M4726 Other spondylosis with radiculopathy, lumbar region: Secondary | ICD-10-CM | POA: Diagnosis not present

## 2020-09-10 ENCOUNTER — Other Ambulatory Visit: Payer: Self-pay | Admitting: Internal Medicine

## 2020-09-15 DIAGNOSIS — C50911 Malignant neoplasm of unspecified site of right female breast: Secondary | ICD-10-CM | POA: Diagnosis not present

## 2020-09-16 DIAGNOSIS — Z853 Personal history of malignant neoplasm of breast: Secondary | ICD-10-CM | POA: Diagnosis not present

## 2020-09-16 DIAGNOSIS — I808 Phlebitis and thrombophlebitis of other sites: Secondary | ICD-10-CM | POA: Diagnosis not present

## 2020-09-16 DIAGNOSIS — K279 Peptic ulcer, site unspecified, unspecified as acute or chronic, without hemorrhage or perforation: Secondary | ICD-10-CM | POA: Diagnosis not present

## 2020-09-16 DIAGNOSIS — M47812 Spondylosis without myelopathy or radiculopathy, cervical region: Secondary | ICD-10-CM | POA: Diagnosis not present

## 2020-09-16 DIAGNOSIS — N6489 Other specified disorders of breast: Secondary | ICD-10-CM | POA: Diagnosis not present

## 2020-09-16 DIAGNOSIS — D509 Iron deficiency anemia, unspecified: Secondary | ICD-10-CM | POA: Diagnosis not present

## 2020-09-16 DIAGNOSIS — Z79899 Other long term (current) drug therapy: Secondary | ICD-10-CM | POA: Diagnosis not present

## 2020-09-16 DIAGNOSIS — I10 Essential (primary) hypertension: Secondary | ICD-10-CM | POA: Diagnosis not present

## 2020-09-16 DIAGNOSIS — Z803 Family history of malignant neoplasm of breast: Secondary | ICD-10-CM | POA: Diagnosis not present

## 2020-09-16 DIAGNOSIS — N651 Disproportion of reconstructed breast: Secondary | ICD-10-CM | POA: Diagnosis not present

## 2020-09-16 DIAGNOSIS — Z7951 Long term (current) use of inhaled steroids: Secondary | ICD-10-CM | POA: Diagnosis not present

## 2020-09-16 DIAGNOSIS — J45909 Unspecified asthma, uncomplicated: Secondary | ICD-10-CM | POA: Diagnosis not present

## 2020-09-16 DIAGNOSIS — Z91048 Other nonmedicinal substance allergy status: Secondary | ICD-10-CM | POA: Diagnosis not present

## 2020-09-16 DIAGNOSIS — D360 Benign neoplasm of lymph nodes: Secondary | ICD-10-CM | POA: Diagnosis not present

## 2020-09-16 DIAGNOSIS — M19019 Primary osteoarthritis, unspecified shoulder: Secondary | ICD-10-CM | POA: Diagnosis not present

## 2020-09-16 DIAGNOSIS — M549 Dorsalgia, unspecified: Secondary | ICD-10-CM | POA: Diagnosis not present

## 2020-09-16 DIAGNOSIS — C50911 Malignant neoplasm of unspecified site of right female breast: Secondary | ICD-10-CM | POA: Diagnosis not present

## 2020-09-16 DIAGNOSIS — G894 Chronic pain syndrome: Secondary | ICD-10-CM | POA: Diagnosis not present

## 2020-09-16 DIAGNOSIS — N62 Hypertrophy of breast: Secondary | ICD-10-CM | POA: Diagnosis not present

## 2020-09-16 DIAGNOSIS — Z170 Estrogen receptor positive status [ER+]: Secondary | ICD-10-CM | POA: Diagnosis not present

## 2020-09-16 DIAGNOSIS — M5136 Other intervertebral disc degeneration, lumbar region: Secondary | ICD-10-CM | POA: Diagnosis not present

## 2020-09-16 DIAGNOSIS — K581 Irritable bowel syndrome with constipation: Secondary | ICD-10-CM | POA: Diagnosis not present

## 2020-09-16 DIAGNOSIS — E079 Disorder of thyroid, unspecified: Secondary | ICD-10-CM | POA: Diagnosis not present

## 2020-09-16 DIAGNOSIS — N6011 Diffuse cystic mastopathy of right breast: Secondary | ICD-10-CM | POA: Diagnosis not present

## 2020-09-16 DIAGNOSIS — K219 Gastro-esophageal reflux disease without esophagitis: Secondary | ICD-10-CM | POA: Diagnosis not present

## 2020-09-16 DIAGNOSIS — N6481 Ptosis of breast: Secondary | ICD-10-CM | POA: Diagnosis not present

## 2020-09-16 DIAGNOSIS — Z791 Long term (current) use of non-steroidal anti-inflammatories (NSAID): Secondary | ICD-10-CM | POA: Diagnosis not present

## 2020-09-16 DIAGNOSIS — D0511 Intraductal carcinoma in situ of right breast: Secondary | ICD-10-CM | POA: Diagnosis not present

## 2020-09-16 DIAGNOSIS — Z885 Allergy status to narcotic agent status: Secondary | ICD-10-CM | POA: Diagnosis not present

## 2020-09-22 DIAGNOSIS — M5416 Radiculopathy, lumbar region: Secondary | ICD-10-CM | POA: Diagnosis not present

## 2020-09-22 DIAGNOSIS — M4316 Spondylolisthesis, lumbar region: Secondary | ICD-10-CM | POA: Diagnosis not present

## 2020-09-23 DIAGNOSIS — C50911 Malignant neoplasm of unspecified site of right female breast: Secondary | ICD-10-CM | POA: Diagnosis not present

## 2020-09-26 DIAGNOSIS — N6489 Other specified disorders of breast: Secondary | ICD-10-CM | POA: Diagnosis not present

## 2020-09-29 DIAGNOSIS — I10 Essential (primary) hypertension: Secondary | ICD-10-CM | POA: Diagnosis not present

## 2020-09-29 DIAGNOSIS — Z170 Estrogen receptor positive status [ER+]: Secondary | ICD-10-CM | POA: Diagnosis not present

## 2020-09-29 DIAGNOSIS — G894 Chronic pain syndrome: Secondary | ICD-10-CM | POA: Diagnosis not present

## 2020-09-29 DIAGNOSIS — K219 Gastro-esophageal reflux disease without esophagitis: Secondary | ICD-10-CM | POA: Diagnosis not present

## 2020-09-29 DIAGNOSIS — D509 Iron deficiency anemia, unspecified: Secondary | ICD-10-CM | POA: Diagnosis not present

## 2020-09-29 DIAGNOSIS — Z87891 Personal history of nicotine dependence: Secondary | ICD-10-CM | POA: Diagnosis not present

## 2020-09-29 DIAGNOSIS — C50911 Malignant neoplasm of unspecified site of right female breast: Secondary | ICD-10-CM | POA: Diagnosis not present

## 2020-09-29 DIAGNOSIS — J4520 Mild intermittent asthma, uncomplicated: Secondary | ICD-10-CM | POA: Diagnosis not present

## 2020-10-04 ENCOUNTER — Other Ambulatory Visit: Payer: Self-pay | Admitting: Internal Medicine

## 2020-10-04 DIAGNOSIS — J4520 Mild intermittent asthma, uncomplicated: Secondary | ICD-10-CM

## 2020-10-04 MED ORDER — PROAIR HFA 108 (90 BASE) MCG/ACT IN AERS: INHALATION_SPRAY | RESPIRATORY_TRACT | 5 refills | Status: DC

## 2020-10-05 DIAGNOSIS — M5416 Radiculopathy, lumbar region: Secondary | ICD-10-CM | POA: Diagnosis not present

## 2020-10-07 DIAGNOSIS — M5412 Radiculopathy, cervical region: Secondary | ICD-10-CM | POA: Diagnosis not present

## 2020-10-07 DIAGNOSIS — M5030 Other cervical disc degeneration, unspecified cervical region: Secondary | ICD-10-CM | POA: Diagnosis not present

## 2020-10-07 DIAGNOSIS — M47816 Spondylosis without myelopathy or radiculopathy, lumbar region: Secondary | ICD-10-CM | POA: Diagnosis not present

## 2020-10-07 DIAGNOSIS — M5416 Radiculopathy, lumbar region: Secondary | ICD-10-CM | POA: Diagnosis not present

## 2020-10-07 DIAGNOSIS — Z5181 Encounter for therapeutic drug level monitoring: Secondary | ICD-10-CM | POA: Diagnosis not present

## 2020-10-07 DIAGNOSIS — Z79899 Other long term (current) drug therapy: Secondary | ICD-10-CM | POA: Diagnosis not present

## 2020-10-18 DIAGNOSIS — Z888 Allergy status to other drugs, medicaments and biological substances status: Secondary | ICD-10-CM | POA: Diagnosis not present

## 2020-10-18 DIAGNOSIS — F17210 Nicotine dependence, cigarettes, uncomplicated: Secondary | ICD-10-CM | POA: Diagnosis not present

## 2020-10-18 DIAGNOSIS — F32A Depression, unspecified: Secondary | ICD-10-CM | POA: Diagnosis not present

## 2020-10-18 DIAGNOSIS — I10 Essential (primary) hypertension: Secondary | ICD-10-CM | POA: Diagnosis not present

## 2020-10-18 DIAGNOSIS — Z170 Estrogen receptor positive status [ER+]: Secondary | ICD-10-CM | POA: Diagnosis not present

## 2020-10-18 DIAGNOSIS — C50911 Malignant neoplasm of unspecified site of right female breast: Secondary | ICD-10-CM | POA: Diagnosis not present

## 2020-10-18 DIAGNOSIS — Z803 Family history of malignant neoplasm of breast: Secondary | ICD-10-CM | POA: Diagnosis not present

## 2020-10-18 DIAGNOSIS — Z885 Allergy status to narcotic agent status: Secondary | ICD-10-CM | POA: Diagnosis not present

## 2020-10-18 DIAGNOSIS — Z9011 Acquired absence of right breast and nipple: Secondary | ICD-10-CM | POA: Diagnosis not present

## 2020-10-18 DIAGNOSIS — Z791 Long term (current) use of non-steroidal anti-inflammatories (NSAID): Secondary | ICD-10-CM | POA: Diagnosis not present

## 2020-10-18 DIAGNOSIS — G8929 Other chronic pain: Secondary | ICD-10-CM | POA: Diagnosis not present

## 2020-10-18 DIAGNOSIS — Z79899 Other long term (current) drug therapy: Secondary | ICD-10-CM | POA: Diagnosis not present

## 2020-10-18 DIAGNOSIS — M549 Dorsalgia, unspecified: Secondary | ICD-10-CM | POA: Diagnosis not present

## 2020-10-18 DIAGNOSIS — K219 Gastro-esophageal reflux disease without esophagitis: Secondary | ICD-10-CM | POA: Diagnosis not present

## 2020-10-19 DIAGNOSIS — M5416 Radiculopathy, lumbar region: Secondary | ICD-10-CM | POA: Diagnosis not present

## 2020-10-19 DIAGNOSIS — M4316 Spondylolisthesis, lumbar region: Secondary | ICD-10-CM | POA: Diagnosis not present

## 2020-10-24 DIAGNOSIS — C50311 Malignant neoplasm of lower-inner quadrant of right female breast: Secondary | ICD-10-CM | POA: Diagnosis not present

## 2020-10-24 DIAGNOSIS — Z9889 Other specified postprocedural states: Secondary | ICD-10-CM | POA: Diagnosis not present

## 2020-10-24 DIAGNOSIS — R922 Inconclusive mammogram: Secondary | ICD-10-CM | POA: Diagnosis not present

## 2020-10-27 DIAGNOSIS — M25611 Stiffness of right shoulder, not elsewhere classified: Secondary | ICD-10-CM | POA: Diagnosis not present

## 2020-10-27 DIAGNOSIS — C50911 Malignant neoplasm of unspecified site of right female breast: Secondary | ICD-10-CM | POA: Diagnosis not present

## 2020-10-27 DIAGNOSIS — Z9011 Acquired absence of right breast and nipple: Secondary | ICD-10-CM | POA: Diagnosis not present

## 2020-10-27 DIAGNOSIS — R29898 Other symptoms and signs involving the musculoskeletal system: Secondary | ICD-10-CM | POA: Diagnosis not present

## 2020-10-27 DIAGNOSIS — M6281 Muscle weakness (generalized): Secondary | ICD-10-CM | POA: Diagnosis not present

## 2020-10-31 ENCOUNTER — Ambulatory Visit: Payer: Medicare Other

## 2020-11-01 DIAGNOSIS — M4726 Other spondylosis with radiculopathy, lumbar region: Secondary | ICD-10-CM | POA: Diagnosis not present

## 2020-11-01 DIAGNOSIS — M48062 Spinal stenosis, lumbar region with neurogenic claudication: Secondary | ICD-10-CM | POA: Diagnosis not present

## 2020-11-01 DIAGNOSIS — M5416 Radiculopathy, lumbar region: Secondary | ICD-10-CM | POA: Diagnosis not present

## 2020-11-01 DIAGNOSIS — M4316 Spondylolisthesis, lumbar region: Secondary | ICD-10-CM | POA: Diagnosis not present

## 2020-11-01 DIAGNOSIS — Z4689 Encounter for fitting and adjustment of other specified devices: Secondary | ICD-10-CM | POA: Diagnosis not present

## 2020-11-01 DIAGNOSIS — M5432 Sciatica, left side: Secondary | ICD-10-CM | POA: Diagnosis not present

## 2020-11-04 DIAGNOSIS — R9431 Abnormal electrocardiogram [ECG] [EKG]: Secondary | ICD-10-CM | POA: Diagnosis not present

## 2020-11-04 DIAGNOSIS — Z01810 Encounter for preprocedural cardiovascular examination: Secondary | ICD-10-CM | POA: Diagnosis not present

## 2020-11-04 DIAGNOSIS — M4316 Spondylolisthesis, lumbar region: Secondary | ICD-10-CM | POA: Diagnosis not present

## 2020-11-04 DIAGNOSIS — Z01812 Encounter for preprocedural laboratory examination: Secondary | ICD-10-CM | POA: Diagnosis not present

## 2020-11-07 DIAGNOSIS — C50911 Malignant neoplasm of unspecified site of right female breast: Secondary | ICD-10-CM | POA: Diagnosis not present

## 2020-11-07 DIAGNOSIS — Z853 Personal history of malignant neoplasm of breast: Secondary | ICD-10-CM | POA: Diagnosis not present

## 2020-11-09 DIAGNOSIS — F17210 Nicotine dependence, cigarettes, uncomplicated: Secondary | ICD-10-CM | POA: Diagnosis not present

## 2020-11-09 DIAGNOSIS — M7138 Other bursal cyst, other site: Secondary | ICD-10-CM | POA: Diagnosis not present

## 2020-11-09 DIAGNOSIS — M4316 Spondylolisthesis, lumbar region: Secondary | ICD-10-CM | POA: Diagnosis not present

## 2020-11-09 DIAGNOSIS — I10 Essential (primary) hypertension: Secondary | ICD-10-CM | POA: Diagnosis not present

## 2020-11-09 DIAGNOSIS — M5432 Sciatica, left side: Secondary | ICD-10-CM | POA: Diagnosis not present

## 2020-11-09 DIAGNOSIS — M48062 Spinal stenosis, lumbar region with neurogenic claudication: Secondary | ICD-10-CM | POA: Diagnosis not present

## 2020-11-09 DIAGNOSIS — J45909 Unspecified asthma, uncomplicated: Secondary | ICD-10-CM | POA: Diagnosis not present

## 2020-11-09 DIAGNOSIS — Z853 Personal history of malignant neoplasm of breast: Secondary | ICD-10-CM | POA: Diagnosis not present

## 2020-11-09 DIAGNOSIS — M4726 Other spondylosis with radiculopathy, lumbar region: Secondary | ICD-10-CM | POA: Diagnosis not present

## 2020-11-09 DIAGNOSIS — Z9889 Other specified postprocedural states: Secondary | ICD-10-CM | POA: Diagnosis not present

## 2020-11-09 DIAGNOSIS — G8918 Other acute postprocedural pain: Secondary | ICD-10-CM | POA: Diagnosis not present

## 2020-11-09 DIAGNOSIS — M4326 Fusion of spine, lumbar region: Secondary | ICD-10-CM | POA: Diagnosis not present

## 2020-11-09 DIAGNOSIS — K219 Gastro-esophageal reflux disease without esophagitis: Secondary | ICD-10-CM | POA: Diagnosis not present

## 2020-11-09 DIAGNOSIS — M961 Postlaminectomy syndrome, not elsewhere classified: Secondary | ICD-10-CM | POA: Diagnosis not present

## 2020-11-09 DIAGNOSIS — Z981 Arthrodesis status: Secondary | ICD-10-CM | POA: Diagnosis not present

## 2020-11-10 DIAGNOSIS — M4726 Other spondylosis with radiculopathy, lumbar region: Secondary | ICD-10-CM | POA: Diagnosis not present

## 2020-11-10 DIAGNOSIS — M4326 Fusion of spine, lumbar region: Secondary | ICD-10-CM | POA: Diagnosis not present

## 2020-11-10 DIAGNOSIS — M7138 Other bursal cyst, other site: Secondary | ICD-10-CM | POA: Diagnosis not present

## 2020-11-10 DIAGNOSIS — K219 Gastro-esophageal reflux disease without esophagitis: Secondary | ICD-10-CM | POA: Diagnosis not present

## 2020-11-10 DIAGNOSIS — Z853 Personal history of malignant neoplasm of breast: Secondary | ICD-10-CM | POA: Diagnosis not present

## 2020-11-10 DIAGNOSIS — F17210 Nicotine dependence, cigarettes, uncomplicated: Secondary | ICD-10-CM | POA: Diagnosis not present

## 2020-11-10 DIAGNOSIS — M961 Postlaminectomy syndrome, not elsewhere classified: Secondary | ICD-10-CM | POA: Diagnosis not present

## 2020-11-10 DIAGNOSIS — M4316 Spondylolisthesis, lumbar region: Secondary | ICD-10-CM | POA: Diagnosis not present

## 2020-11-10 DIAGNOSIS — M48062 Spinal stenosis, lumbar region with neurogenic claudication: Secondary | ICD-10-CM | POA: Diagnosis not present

## 2020-11-10 DIAGNOSIS — J45909 Unspecified asthma, uncomplicated: Secondary | ICD-10-CM | POA: Diagnosis not present

## 2020-11-10 DIAGNOSIS — Z9889 Other specified postprocedural states: Secondary | ICD-10-CM | POA: Diagnosis not present

## 2020-11-10 DIAGNOSIS — Z981 Arthrodesis status: Secondary | ICD-10-CM | POA: Diagnosis not present

## 2020-11-10 DIAGNOSIS — R269 Unspecified abnormalities of gait and mobility: Secondary | ICD-10-CM | POA: Diagnosis not present

## 2020-11-10 DIAGNOSIS — I10 Essential (primary) hypertension: Secondary | ICD-10-CM | POA: Diagnosis not present

## 2020-11-11 DIAGNOSIS — I10 Essential (primary) hypertension: Secondary | ICD-10-CM | POA: Diagnosis not present

## 2020-11-11 DIAGNOSIS — Z9889 Other specified postprocedural states: Secondary | ICD-10-CM | POA: Diagnosis not present

## 2020-11-11 DIAGNOSIS — M48062 Spinal stenosis, lumbar region with neurogenic claudication: Secondary | ICD-10-CM | POA: Diagnosis not present

## 2020-11-11 DIAGNOSIS — M4316 Spondylolisthesis, lumbar region: Secondary | ICD-10-CM | POA: Diagnosis not present

## 2020-11-11 DIAGNOSIS — Z853 Personal history of malignant neoplasm of breast: Secondary | ICD-10-CM | POA: Diagnosis not present

## 2020-11-11 DIAGNOSIS — M961 Postlaminectomy syndrome, not elsewhere classified: Secondary | ICD-10-CM | POA: Diagnosis not present

## 2020-11-11 DIAGNOSIS — K219 Gastro-esophageal reflux disease without esophagitis: Secondary | ICD-10-CM | POA: Diagnosis not present

## 2020-11-11 DIAGNOSIS — J45909 Unspecified asthma, uncomplicated: Secondary | ICD-10-CM | POA: Diagnosis not present

## 2020-11-11 DIAGNOSIS — F17210 Nicotine dependence, cigarettes, uncomplicated: Secondary | ICD-10-CM | POA: Diagnosis not present

## 2020-11-11 DIAGNOSIS — Z981 Arthrodesis status: Secondary | ICD-10-CM | POA: Diagnosis not present

## 2020-11-11 DIAGNOSIS — M7138 Other bursal cyst, other site: Secondary | ICD-10-CM | POA: Diagnosis not present

## 2020-11-11 DIAGNOSIS — M4726 Other spondylosis with radiculopathy, lumbar region: Secondary | ICD-10-CM | POA: Diagnosis not present

## 2020-11-14 ENCOUNTER — Telehealth: Payer: Self-pay | Admitting: Internal Medicine

## 2020-11-14 NOTE — Telephone Encounter (Signed)
Kathlene November w/ [facility] called and was wondering if Dr. Okey Dupre would follow the patient for home health and sign orders.    [phone]

## 2020-11-16 NOTE — Telephone Encounter (Signed)
I cannot sign off on any home health orders for patient as there is no face to face within time frame. Would need face to face prior to orders.

## 2020-11-17 NOTE — Telephone Encounter (Signed)
Left detailed message informing Kathlene November @ [facility] of below. Called pt- OV scheduled 11/22/20 @ 2:40.

## 2020-11-22 ENCOUNTER — Telehealth (INDEPENDENT_AMBULATORY_CARE_PROVIDER_SITE_OTHER): Payer: Medicare Other | Admitting: Internal Medicine

## 2020-11-22 ENCOUNTER — Encounter: Payer: Self-pay | Admitting: Internal Medicine

## 2020-11-22 ENCOUNTER — Ambulatory Visit: Payer: Medicare Other | Admitting: Internal Medicine

## 2020-11-22 DIAGNOSIS — J4521 Mild intermittent asthma with (acute) exacerbation: Secondary | ICD-10-CM

## 2020-11-22 DIAGNOSIS — R300 Dysuria: Secondary | ICD-10-CM | POA: Diagnosis not present

## 2020-11-22 DIAGNOSIS — Z720 Tobacco use: Secondary | ICD-10-CM | POA: Diagnosis not present

## 2020-11-22 DIAGNOSIS — M545 Low back pain, unspecified: Secondary | ICD-10-CM

## 2020-11-22 MED ORDER — PREDNISONE 20 MG PO TABS: 40.0000 mg | ORAL_TABLET | Freq: Every day | ORAL | 0 refills | Status: DC

## 2020-11-22 MED ORDER — SULFAMETHOXAZOLE-TRIMETHOPRIM 800-160 MG PO TABS: 1.0000 | ORAL_TABLET | Freq: Two times a day (BID) | ORAL | 0 refills | Status: DC

## 2020-11-22 MED ORDER — POLYETHYLENE GLYCOL 3350 17 GM/SCOOP PO POWD: 17.0000 g | Freq: Two times a day (BID) | ORAL | 1 refills | Status: DC | PRN

## 2020-11-22 NOTE — Assessment & Plan Note (Signed)
Rx prednisone for possible flare. No antibiotic indicated.

## 2020-11-22 NOTE — Assessment & Plan Note (Signed)
Needs home health currently s/p surgery and needs shower chair which is ordered today. She already has walker and elevated toilet seat.

## 2020-11-22 NOTE — Progress Notes (Signed)
Virtual Visit via Video Note  I connected with Emily [name] on 11/22/20 at  2:40 PM EST by a video enabled telemedicine application and verified that I am speaking with the correct person using two identifiers.  The patient and the provider were at separate locations throughout the entire encounter. Patient location: home, Provider location: work   I discussed the limitations of evaluation and management by telemedicine and the availability of in person appointments. The patient expressed understanding and agreed to proceed. The patient and the provider were the only parties present for the visit unless noted in HPI below.  History of Present Illness: The patient is a 50 y.o. female with visit for surgery follow up for home health orders and several other concerns. Started hurting after surgery end of December with lumbar surgery. She is still in back brace. Home with walker and toilet seat but no shower chair and this is a struggle for her. Has been walking with walker in the neighborhood. Is also having uti (had some symptoms of this in the hospital, she is not having fevers or chills, having pressure midline and having to urinate all the time, feels like she has to go even when she has just gone) and constipation (is taking lyrica and oxycodone which she typically takes, no BM 5 days after surgery and some straining, she is not going regularly, typically takes senokot-d and this is not helping, she has doubled up and this has not helped), and asthma (she feels that the tube from surgery irritated her and she is coughing a lot, denies SOB except while coughing, has done several breathing treatments since surgery, feels throat was injured with surgery in October, 3 recent surgeries in the last several months).   Observations/Objective: Appearance: sitting up, breathing appears normal, some coughing during visit, casual grooming, abdomen does not appear distended, throat normal, memory normal,  mental status is A and O times 3  Assessment and Plan: See problem oriented charting  Follow Up Instructions: rx prednisone, bactrim, shower chair  I discussed the assessment and treatment plan with the patient. The patient was provided an opportunity to ask questions and all were answered. The patient agreed with the plan and demonstrated an understanding of the instructions.   The patient was advised to call back or seek an in-person evaluation if the symptoms worsen or if the condition fails to improve as anticipated.  Myrlene Broker, MD

## 2020-11-22 NOTE — Assessment & Plan Note (Signed)
Rx bactrim to cover for infection post-operatively.

## 2020-11-22 NOTE — Telephone Encounter (Signed)
For Emily [name] we will need to call her home health [facility] (there is a phone note with the information) and approve the verbal orders and also let them know that we have ordered her a shower chair (order in epic) to deliver to her.    Called Kathlene November who states he cannot take VO over the phone b/c he is not a Engineer, civil (consulting).  States there should be a fax with orders to sign.  Will locate fax & f/u as needed.

## 2020-11-22 NOTE — Assessment & Plan Note (Signed)
Advised to consider quitting as this can be impacting her breathing and coughing especially in the setting of recent surgery.

## 2020-11-23 DIAGNOSIS — G5603 Carpal tunnel syndrome, bilateral upper limbs: Secondary | ICD-10-CM | POA: Diagnosis not present

## 2020-11-23 DIAGNOSIS — I10 Essential (primary) hypertension: Secondary | ICD-10-CM | POA: Diagnosis not present

## 2020-11-23 DIAGNOSIS — K219 Gastro-esophageal reflux disease without esophagitis: Secondary | ICD-10-CM | POA: Diagnosis not present

## 2020-11-23 DIAGNOSIS — K649 Unspecified hemorrhoids: Secondary | ICD-10-CM | POA: Diagnosis not present

## 2020-11-23 DIAGNOSIS — E041 Nontoxic single thyroid nodule: Secondary | ICD-10-CM | POA: Diagnosis not present

## 2020-11-23 DIAGNOSIS — D509 Iron deficiency anemia, unspecified: Secondary | ICD-10-CM | POA: Diagnosis not present

## 2020-11-23 DIAGNOSIS — J309 Allergic rhinitis, unspecified: Secondary | ICD-10-CM | POA: Diagnosis not present

## 2020-11-23 DIAGNOSIS — Z4789 Encounter for other orthopedic aftercare: Secondary | ICD-10-CM | POA: Diagnosis not present

## 2020-11-23 DIAGNOSIS — M65321 Trigger finger, right index finger: Secondary | ICD-10-CM | POA: Diagnosis not present

## 2020-11-23 DIAGNOSIS — Z981 Arthrodesis status: Secondary | ICD-10-CM | POA: Diagnosis not present

## 2020-11-23 DIAGNOSIS — R300 Dysuria: Secondary | ICD-10-CM | POA: Diagnosis not present

## 2020-11-23 DIAGNOSIS — M1990 Unspecified osteoarthritis, unspecified site: Secondary | ICD-10-CM | POA: Diagnosis not present

## 2020-11-23 DIAGNOSIS — J45909 Unspecified asthma, uncomplicated: Secondary | ICD-10-CM | POA: Diagnosis not present

## 2020-11-23 NOTE — Telephone Encounter (Signed)
Vernona Rieger from [facility] asking for verbal order for  PT, 1W6 Also requesting order for shower bench and reacher be faxed to [phone]  Vernona Rieger phone [phone], ok to leave a message

## 2020-11-27 ENCOUNTER — Encounter: Payer: Self-pay | Admitting: Internal Medicine

## 2020-11-27 ENCOUNTER — Other Ambulatory Visit: Payer: Self-pay | Admitting: Internal Medicine

## 2020-11-27 DIAGNOSIS — M545 Low back pain, unspecified: Secondary | ICD-10-CM

## 2020-11-28 DIAGNOSIS — Z510 Encounter for antineoplastic radiation therapy: Secondary | ICD-10-CM | POA: Diagnosis not present

## 2020-11-28 DIAGNOSIS — C50911 Malignant neoplasm of unspecified site of right female breast: Secondary | ICD-10-CM | POA: Diagnosis not present

## 2020-11-28 DIAGNOSIS — Z170 Estrogen receptor positive status [ER+]: Secondary | ICD-10-CM | POA: Diagnosis not present

## 2020-11-30 ENCOUNTER — Other Ambulatory Visit: Payer: Self-pay | Admitting: Internal Medicine

## 2020-11-30 DIAGNOSIS — J4520 Mild intermittent asthma, uncomplicated: Secondary | ICD-10-CM

## 2020-11-30 NOTE — Telephone Encounter (Signed)
She is calling to make [place] aware that she also needs a reacher as well because she is short. She is also wondering if it can be sent to her house because [facility] doesn't come out to her house until Saturday

## 2020-12-01 DIAGNOSIS — C50911 Malignant neoplasm of unspecified site of right female breast: Secondary | ICD-10-CM | POA: Diagnosis not present

## 2020-12-01 DIAGNOSIS — Z170 Estrogen receptor positive status [ER+]: Secondary | ICD-10-CM | POA: Diagnosis not present

## 2020-12-01 DIAGNOSIS — Z510 Encounter for antineoplastic radiation therapy: Secondary | ICD-10-CM | POA: Diagnosis not present

## 2020-12-06 MED ORDER — FLUCONAZOLE 150 MG PO TABS: 150.0000 mg | ORAL_TABLET | Freq: Once | ORAL | 0 refills | Status: AC

## 2020-12-12 DIAGNOSIS — C50911 Malignant neoplasm of unspecified site of right female breast: Secondary | ICD-10-CM | POA: Diagnosis not present

## 2020-12-12 DIAGNOSIS — Z170 Estrogen receptor positive status [ER+]: Secondary | ICD-10-CM | POA: Diagnosis not present

## 2020-12-12 DIAGNOSIS — Z510 Encounter for antineoplastic radiation therapy: Secondary | ICD-10-CM | POA: Diagnosis not present

## 2020-12-13 DIAGNOSIS — C50911 Malignant neoplasm of unspecified site of right female breast: Secondary | ICD-10-CM | POA: Diagnosis not present

## 2020-12-13 DIAGNOSIS — M545 Low back pain, unspecified: Secondary | ICD-10-CM | POA: Diagnosis not present

## 2020-12-13 DIAGNOSIS — Z510 Encounter for antineoplastic radiation therapy: Secondary | ICD-10-CM | POA: Diagnosis not present

## 2020-12-13 DIAGNOSIS — M4326 Fusion of spine, lumbar region: Secondary | ICD-10-CM | POA: Diagnosis not present

## 2020-12-14 DIAGNOSIS — C50911 Malignant neoplasm of unspecified site of right female breast: Secondary | ICD-10-CM | POA: Diagnosis not present

## 2020-12-14 DIAGNOSIS — Z510 Encounter for antineoplastic radiation therapy: Secondary | ICD-10-CM | POA: Diagnosis not present

## 2020-12-15 DIAGNOSIS — Z510 Encounter for antineoplastic radiation therapy: Secondary | ICD-10-CM | POA: Diagnosis not present

## 2020-12-15 DIAGNOSIS — C50911 Malignant neoplasm of unspecified site of right female breast: Secondary | ICD-10-CM | POA: Diagnosis not present

## 2020-12-16 DIAGNOSIS — C50911 Malignant neoplasm of unspecified site of right female breast: Secondary | ICD-10-CM | POA: Diagnosis not present

## 2020-12-16 DIAGNOSIS — Z510 Encounter for antineoplastic radiation therapy: Secondary | ICD-10-CM | POA: Diagnosis not present

## 2020-12-20 ENCOUNTER — Telehealth: Payer: Self-pay | Admitting: Internal Medicine

## 2020-12-20 DIAGNOSIS — G8929 Other chronic pain: Secondary | ICD-10-CM | POA: Diagnosis not present

## 2020-12-20 DIAGNOSIS — R300 Dysuria: Secondary | ICD-10-CM | POA: Diagnosis not present

## 2020-12-20 DIAGNOSIS — Z170 Estrogen receptor positive status [ER+]: Secondary | ICD-10-CM | POA: Diagnosis not present

## 2020-12-20 DIAGNOSIS — Z510 Encounter for antineoplastic radiation therapy: Secondary | ICD-10-CM | POA: Diagnosis not present

## 2020-12-20 DIAGNOSIS — J45909 Unspecified asthma, uncomplicated: Secondary | ICD-10-CM | POA: Diagnosis not present

## 2020-12-20 DIAGNOSIS — L988 Other specified disorders of the skin and subcutaneous tissue: Secondary | ICD-10-CM | POA: Diagnosis not present

## 2020-12-20 DIAGNOSIS — G5603 Carpal tunnel syndrome, bilateral upper limbs: Secondary | ICD-10-CM | POA: Diagnosis not present

## 2020-12-20 DIAGNOSIS — Z4789 Encounter for other orthopedic aftercare: Secondary | ICD-10-CM | POA: Diagnosis not present

## 2020-12-20 DIAGNOSIS — M1990 Unspecified osteoarthritis, unspecified site: Secondary | ICD-10-CM | POA: Diagnosis not present

## 2020-12-20 DIAGNOSIS — E041 Nontoxic single thyroid nodule: Secondary | ICD-10-CM | POA: Diagnosis not present

## 2020-12-20 DIAGNOSIS — K649 Unspecified hemorrhoids: Secondary | ICD-10-CM | POA: Diagnosis not present

## 2020-12-20 DIAGNOSIS — K219 Gastro-esophageal reflux disease without esophagitis: Secondary | ICD-10-CM | POA: Diagnosis not present

## 2020-12-20 DIAGNOSIS — I10 Essential (primary) hypertension: Secondary | ICD-10-CM | POA: Diagnosis not present

## 2020-12-20 DIAGNOSIS — M549 Dorsalgia, unspecified: Secondary | ICD-10-CM | POA: Diagnosis not present

## 2020-12-20 DIAGNOSIS — D509 Iron deficiency anemia, unspecified: Secondary | ICD-10-CM | POA: Diagnosis not present

## 2020-12-20 DIAGNOSIS — C50911 Malignant neoplasm of unspecified site of right female breast: Secondary | ICD-10-CM | POA: Diagnosis not present

## 2020-12-20 DIAGNOSIS — Z981 Arthrodesis status: Secondary | ICD-10-CM | POA: Diagnosis not present

## 2020-12-20 DIAGNOSIS — M65321 Trigger finger, right index finger: Secondary | ICD-10-CM | POA: Diagnosis not present

## 2020-12-20 DIAGNOSIS — J309 Allergic rhinitis, unspecified: Secondary | ICD-10-CM | POA: Diagnosis not present

## 2020-12-20 NOTE — Telephone Encounter (Signed)
   Mickel Baas from [facility] calling to report patient discharged from [facility]; goals met

## 2020-12-21 DIAGNOSIS — Z510 Encounter for antineoplastic radiation therapy: Secondary | ICD-10-CM | POA: Diagnosis not present

## 2020-12-21 DIAGNOSIS — M47816 Spondylosis without myelopathy or radiculopathy, lumbar region: Secondary | ICD-10-CM | POA: Diagnosis not present

## 2020-12-21 DIAGNOSIS — G8929 Other chronic pain: Secondary | ICD-10-CM | POA: Diagnosis not present

## 2020-12-21 DIAGNOSIS — M5030 Other cervical disc degeneration, unspecified cervical region: Secondary | ICD-10-CM | POA: Diagnosis not present

## 2020-12-21 DIAGNOSIS — L988 Other specified disorders of the skin and subcutaneous tissue: Secondary | ICD-10-CM | POA: Diagnosis not present

## 2020-12-21 DIAGNOSIS — M5412 Radiculopathy, cervical region: Secondary | ICD-10-CM | POA: Diagnosis not present

## 2020-12-21 DIAGNOSIS — C50911 Malignant neoplasm of unspecified site of right female breast: Secondary | ICD-10-CM | POA: Diagnosis not present

## 2020-12-21 DIAGNOSIS — G894 Chronic pain syndrome: Secondary | ICD-10-CM | POA: Diagnosis not present

## 2020-12-21 DIAGNOSIS — M549 Dorsalgia, unspecified: Secondary | ICD-10-CM | POA: Diagnosis not present

## 2020-12-21 DIAGNOSIS — M5136 Other intervertebral disc degeneration, lumbar region: Secondary | ICD-10-CM | POA: Diagnosis not present

## 2020-12-21 DIAGNOSIS — Z170 Estrogen receptor positive status [ER+]: Secondary | ICD-10-CM | POA: Diagnosis not present

## 2020-12-21 DIAGNOSIS — Z981 Arthrodesis status: Secondary | ICD-10-CM | POA: Diagnosis not present

## 2020-12-21 DIAGNOSIS — M130 Polyarthritis, unspecified: Secondary | ICD-10-CM | POA: Diagnosis not present

## 2020-12-22 DIAGNOSIS — G8929 Other chronic pain: Secondary | ICD-10-CM | POA: Diagnosis not present

## 2020-12-22 DIAGNOSIS — L988 Other specified disorders of the skin and subcutaneous tissue: Secondary | ICD-10-CM | POA: Diagnosis not present

## 2020-12-22 DIAGNOSIS — Z510 Encounter for antineoplastic radiation therapy: Secondary | ICD-10-CM | POA: Diagnosis not present

## 2020-12-22 DIAGNOSIS — M549 Dorsalgia, unspecified: Secondary | ICD-10-CM | POA: Diagnosis not present

## 2020-12-22 DIAGNOSIS — C50911 Malignant neoplasm of unspecified site of right female breast: Secondary | ICD-10-CM | POA: Diagnosis not present

## 2020-12-22 DIAGNOSIS — Z170 Estrogen receptor positive status [ER+]: Secondary | ICD-10-CM | POA: Diagnosis not present

## 2020-12-23 ENCOUNTER — Telehealth: Payer: Self-pay | Admitting: Internal Medicine

## 2020-12-23 NOTE — Progress Notes (Signed)
  Chronic Care Management   Outreach Note  12/23/2020 Name: Emily [name] MRN: [id number] DOB: 1971/03/29  Referred by: Hoyt Koch, MD Reason for referral : No chief complaint on file.   An unsuccessful telephone outreach was attempted today. The patient was referred to the pharmacist for assistance with care management and care coordination.   Follow Up Plan:   Carley Perdue [employer] Scheduler

## 2020-12-26 DIAGNOSIS — L988 Other specified disorders of the skin and subcutaneous tissue: Secondary | ICD-10-CM | POA: Diagnosis not present

## 2020-12-26 DIAGNOSIS — G8929 Other chronic pain: Secondary | ICD-10-CM | POA: Diagnosis not present

## 2020-12-26 DIAGNOSIS — M549 Dorsalgia, unspecified: Secondary | ICD-10-CM | POA: Diagnosis not present

## 2020-12-26 DIAGNOSIS — C50911 Malignant neoplasm of unspecified site of right female breast: Secondary | ICD-10-CM | POA: Diagnosis not present

## 2020-12-26 DIAGNOSIS — Z510 Encounter for antineoplastic radiation therapy: Secondary | ICD-10-CM | POA: Diagnosis not present

## 2020-12-26 DIAGNOSIS — Z170 Estrogen receptor positive status [ER+]: Secondary | ICD-10-CM | POA: Diagnosis not present

## 2020-12-27 ENCOUNTER — Telehealth: Payer: Self-pay | Admitting: Internal Medicine

## 2020-12-27 DIAGNOSIS — M549 Dorsalgia, unspecified: Secondary | ICD-10-CM | POA: Diagnosis not present

## 2020-12-27 DIAGNOSIS — G8929 Other chronic pain: Secondary | ICD-10-CM | POA: Diagnosis not present

## 2020-12-27 DIAGNOSIS — Z510 Encounter for antineoplastic radiation therapy: Secondary | ICD-10-CM | POA: Diagnosis not present

## 2020-12-27 DIAGNOSIS — C50911 Malignant neoplasm of unspecified site of right female breast: Secondary | ICD-10-CM | POA: Diagnosis not present

## 2020-12-27 DIAGNOSIS — L988 Other specified disorders of the skin and subcutaneous tissue: Secondary | ICD-10-CM | POA: Diagnosis not present

## 2020-12-27 DIAGNOSIS — Z170 Estrogen receptor positive status [ER+]: Secondary | ICD-10-CM | POA: Diagnosis not present

## 2020-12-27 NOTE — Progress Notes (Signed)
  Chronic Care Management   Outreach Note  12/27/2020 Name: Emily [name] MRN: [id number] DOB: 1971-04-26  Referred by: Hoyt Koch, MD Reason for referral : No chief complaint on file.   A second unsuccessful telephone outreach was attempted today. The patient was referred to pharmacist for assistance with care management and care coordination.  Follow Up Plan:   Carley Perdue [employer] Scheduler

## 2020-12-28 DIAGNOSIS — Z170 Estrogen receptor positive status [ER+]: Secondary | ICD-10-CM | POA: Diagnosis not present

## 2020-12-28 DIAGNOSIS — C50911 Malignant neoplasm of unspecified site of right female breast: Secondary | ICD-10-CM | POA: Diagnosis not present

## 2020-12-28 DIAGNOSIS — G8929 Other chronic pain: Secondary | ICD-10-CM | POA: Diagnosis not present

## 2020-12-28 DIAGNOSIS — L988 Other specified disorders of the skin and subcutaneous tissue: Secondary | ICD-10-CM | POA: Diagnosis not present

## 2020-12-28 DIAGNOSIS — Z510 Encounter for antineoplastic radiation therapy: Secondary | ICD-10-CM | POA: Diagnosis not present

## 2020-12-28 DIAGNOSIS — M549 Dorsalgia, unspecified: Secondary | ICD-10-CM | POA: Diagnosis not present

## 2020-12-29 ENCOUNTER — Other Ambulatory Visit: Payer: Self-pay | Admitting: Internal Medicine

## 2020-12-29 DIAGNOSIS — Z510 Encounter for antineoplastic radiation therapy: Secondary | ICD-10-CM | POA: Diagnosis not present

## 2020-12-29 DIAGNOSIS — M549 Dorsalgia, unspecified: Secondary | ICD-10-CM | POA: Diagnosis not present

## 2020-12-29 DIAGNOSIS — C50911 Malignant neoplasm of unspecified site of right female breast: Secondary | ICD-10-CM | POA: Diagnosis not present

## 2020-12-29 DIAGNOSIS — G8929 Other chronic pain: Secondary | ICD-10-CM | POA: Diagnosis not present

## 2020-12-29 DIAGNOSIS — L988 Other specified disorders of the skin and subcutaneous tissue: Secondary | ICD-10-CM | POA: Diagnosis not present

## 2020-12-29 DIAGNOSIS — Z170 Estrogen receptor positive status [ER+]: Secondary | ICD-10-CM | POA: Diagnosis not present

## 2020-12-30 DIAGNOSIS — M549 Dorsalgia, unspecified: Secondary | ICD-10-CM | POA: Diagnosis not present

## 2020-12-30 DIAGNOSIS — L988 Other specified disorders of the skin and subcutaneous tissue: Secondary | ICD-10-CM | POA: Diagnosis not present

## 2020-12-30 DIAGNOSIS — Z170 Estrogen receptor positive status [ER+]: Secondary | ICD-10-CM | POA: Diagnosis not present

## 2020-12-30 DIAGNOSIS — G8929 Other chronic pain: Secondary | ICD-10-CM | POA: Diagnosis not present

## 2020-12-30 DIAGNOSIS — Z510 Encounter for antineoplastic radiation therapy: Secondary | ICD-10-CM | POA: Diagnosis not present

## 2020-12-30 DIAGNOSIS — C50911 Malignant neoplasm of unspecified site of right female breast: Secondary | ICD-10-CM | POA: Diagnosis not present

## 2020-12-30 NOTE — Telephone Encounter (Signed)
Ok to refill? Please advise.  

## 2021-01-02 DIAGNOSIS — Z170 Estrogen receptor positive status [ER+]: Secondary | ICD-10-CM | POA: Diagnosis not present

## 2021-01-02 DIAGNOSIS — G8929 Other chronic pain: Secondary | ICD-10-CM | POA: Diagnosis not present

## 2021-01-02 DIAGNOSIS — C50911 Malignant neoplasm of unspecified site of right female breast: Secondary | ICD-10-CM | POA: Diagnosis not present

## 2021-01-02 DIAGNOSIS — M549 Dorsalgia, unspecified: Secondary | ICD-10-CM | POA: Diagnosis not present

## 2021-01-02 DIAGNOSIS — L988 Other specified disorders of the skin and subcutaneous tissue: Secondary | ICD-10-CM | POA: Diagnosis not present

## 2021-01-02 DIAGNOSIS — Z510 Encounter for antineoplastic radiation therapy: Secondary | ICD-10-CM | POA: Diagnosis not present

## 2021-01-03 ENCOUNTER — Ambulatory Visit: Payer: Medicare Other

## 2021-01-03 DIAGNOSIS — M549 Dorsalgia, unspecified: Secondary | ICD-10-CM | POA: Diagnosis not present

## 2021-01-03 DIAGNOSIS — G8929 Other chronic pain: Secondary | ICD-10-CM | POA: Diagnosis not present

## 2021-01-03 DIAGNOSIS — Z510 Encounter for antineoplastic radiation therapy: Secondary | ICD-10-CM | POA: Diagnosis not present

## 2021-01-03 DIAGNOSIS — L988 Other specified disorders of the skin and subcutaneous tissue: Secondary | ICD-10-CM | POA: Diagnosis not present

## 2021-01-03 DIAGNOSIS — C50911 Malignant neoplasm of unspecified site of right female breast: Secondary | ICD-10-CM | POA: Diagnosis not present

## 2021-01-03 DIAGNOSIS — Z170 Estrogen receptor positive status [ER+]: Secondary | ICD-10-CM | POA: Diagnosis not present

## 2021-01-04 DIAGNOSIS — M549 Dorsalgia, unspecified: Secondary | ICD-10-CM | POA: Diagnosis not present

## 2021-01-04 DIAGNOSIS — N6091 Unspecified benign mammary dysplasia of right breast: Secondary | ICD-10-CM | POA: Diagnosis not present

## 2021-01-04 DIAGNOSIS — D509 Iron deficiency anemia, unspecified: Secondary | ICD-10-CM | POA: Diagnosis not present

## 2021-01-04 DIAGNOSIS — G8929 Other chronic pain: Secondary | ICD-10-CM | POA: Diagnosis not present

## 2021-01-04 DIAGNOSIS — I10 Essential (primary) hypertension: Secondary | ICD-10-CM | POA: Diagnosis not present

## 2021-01-04 DIAGNOSIS — L988 Other specified disorders of the skin and subcutaneous tissue: Secondary | ICD-10-CM | POA: Diagnosis not present

## 2021-01-04 DIAGNOSIS — C50911 Malignant neoplasm of unspecified site of right female breast: Secondary | ICD-10-CM | POA: Diagnosis not present

## 2021-01-04 DIAGNOSIS — Z510 Encounter for antineoplastic radiation therapy: Secondary | ICD-10-CM | POA: Diagnosis not present

## 2021-01-04 DIAGNOSIS — Z170 Estrogen receptor positive status [ER+]: Secondary | ICD-10-CM | POA: Diagnosis not present

## 2021-01-05 ENCOUNTER — Telehealth: Payer: Self-pay | Admitting: Internal Medicine

## 2021-01-05 NOTE — Telephone Encounter (Signed)
Error

## 2021-01-06 ENCOUNTER — Telehealth: Payer: Self-pay | Admitting: Internal Medicine

## 2021-01-06 NOTE — Progress Notes (Signed)
  Chronic Care Management   Note  01/06/2021 Name: Emily [name] MRN: [id number] DOB: [birth year]  Emily [name] is a 50 y.o. year old female who is a primary care patient of Hoyt Koch, MD. I reached out to [name] by phone today in response to a referral sent by [name] PCP, Hoyt Koch, MD.   [name] was given information about Chronic Care Management services today including:  1. CCM service includes personalized support from designated clinical staff supervised by her physician, including individualized plan of care and coordination with other care providers 2. 24/7 contact phone numbers for assistance for urgent and routine care needs. 3. Service will only be billed when office clinical staff spend 20 minutes or more in a month to coordinate care. 4. Only one practitioner may furnish and bill the service in a calendar month. 5. The patient may stop CCM services at any time (effective at the end of the month) by phone call to the office staff.   Patient agreed to services and verbal consent obtained.   Follow up plan:   Carley Perdue [employer] Scheduler

## 2021-01-10 ENCOUNTER — Other Ambulatory Visit: Payer: Self-pay | Admitting: Family

## 2021-01-12 DIAGNOSIS — R300 Dysuria: Secondary | ICD-10-CM | POA: Diagnosis not present

## 2021-01-12 DIAGNOSIS — K649 Unspecified hemorrhoids: Secondary | ICD-10-CM | POA: Diagnosis not present

## 2021-01-12 DIAGNOSIS — M1990 Unspecified osteoarthritis, unspecified site: Secondary | ICD-10-CM | POA: Diagnosis not present

## 2021-01-12 DIAGNOSIS — E041 Nontoxic single thyroid nodule: Secondary | ICD-10-CM | POA: Diagnosis not present

## 2021-01-12 DIAGNOSIS — K219 Gastro-esophageal reflux disease without esophagitis: Secondary | ICD-10-CM | POA: Diagnosis not present

## 2021-01-12 DIAGNOSIS — Z4789 Encounter for other orthopedic aftercare: Secondary | ICD-10-CM | POA: Diagnosis not present

## 2021-01-12 DIAGNOSIS — J45909 Unspecified asthma, uncomplicated: Secondary | ICD-10-CM

## 2021-01-12 DIAGNOSIS — J309 Allergic rhinitis, unspecified: Secondary | ICD-10-CM

## 2021-01-12 DIAGNOSIS — D509 Iron deficiency anemia, unspecified: Secondary | ICD-10-CM | POA: Diagnosis not present

## 2021-01-12 DIAGNOSIS — M65321 Trigger finger, right index finger: Secondary | ICD-10-CM | POA: Diagnosis not present

## 2021-01-12 DIAGNOSIS — Z981 Arthrodesis status: Secondary | ICD-10-CM

## 2021-01-12 DIAGNOSIS — I10 Essential (primary) hypertension: Secondary | ICD-10-CM | POA: Diagnosis not present

## 2021-01-12 DIAGNOSIS — F418 Other specified anxiety disorders: Secondary | ICD-10-CM

## 2021-01-12 DIAGNOSIS — G5603 Carpal tunnel syndrome, bilateral upper limbs: Secondary | ICD-10-CM | POA: Diagnosis not present

## 2021-01-16 ENCOUNTER — Ambulatory Visit (INDEPENDENT_AMBULATORY_CARE_PROVIDER_SITE_OTHER): Payer: Medicare Other | Admitting: Internal Medicine

## 2021-01-16 ENCOUNTER — Ambulatory Visit (INDEPENDENT_AMBULATORY_CARE_PROVIDER_SITE_OTHER): Payer: Medicare Other

## 2021-01-16 ENCOUNTER — Other Ambulatory Visit: Payer: Self-pay

## 2021-01-16 ENCOUNTER — Encounter: Payer: Self-pay | Admitting: Internal Medicine

## 2021-01-16 VITALS — BP 122/70 | HR 76 | Temp 98.9°F | Resp 18 | Ht 59.0 in | Wt 180.0 lb

## 2021-01-16 VITALS — BP 122/70 | HR 76 | Temp 98.9°F | Ht 59.02 in | Wt 180.0 lb

## 2021-01-16 DIAGNOSIS — N6091 Unspecified benign mammary dysplasia of right breast: Secondary | ICD-10-CM

## 2021-01-16 DIAGNOSIS — Z0000 Encounter for general adult medical examination without abnormal findings: Secondary | ICD-10-CM

## 2021-01-16 DIAGNOSIS — Z0001 Encounter for general adult medical examination with abnormal findings: Secondary | ICD-10-CM

## 2021-01-16 DIAGNOSIS — M545 Low back pain, unspecified: Secondary | ICD-10-CM

## 2021-01-16 DIAGNOSIS — I10 Essential (primary) hypertension: Secondary | ICD-10-CM | POA: Diagnosis not present

## 2021-01-16 DIAGNOSIS — F411 Generalized anxiety disorder: Secondary | ICD-10-CM | POA: Diagnosis not present

## 2021-01-16 DIAGNOSIS — J4520 Mild intermittent asthma, uncomplicated: Secondary | ICD-10-CM | POA: Diagnosis not present

## 2021-01-16 DIAGNOSIS — Z720 Tobacco use: Secondary | ICD-10-CM | POA: Diagnosis not present

## 2021-01-16 DIAGNOSIS — N6092 Unspecified benign mammary dysplasia of left breast: Secondary | ICD-10-CM

## 2021-01-16 MED ORDER — SILVER SULFADIAZINE 1 % EX CREA: 1.0000 "application " | TOPICAL_CREAM | Freq: Every day | CUTANEOUS | 0 refills | Status: DC
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
## 2021-01-16 NOTE — Patient Instructions (Addendum)
We do not need blood work today.  We have sent in silvadene cream to use on the chest daily. It is okay to use the coconut oil as well.

## 2021-01-16 NOTE — Patient Instructions (Signed)
[name] , Thank you for taking time to come for your Medicare Wellness Visit. I appreciate your ongoing commitment to your health goals. Please review the following plan we discussed and let me know if I can assist you in the future.   Screening recommendations/referrals: Colonoscopy: 08/07/2017; due every 10 years Mammogram: 09/2020 per patient  Bone Density: never done Recommended yearly ophthalmology/optometry visit for glaucoma screening and checkup Recommended yearly dental visit for hygiene and checkup  Vaccinations: Influenza vaccine: 07/18/2020 Pneumococcal vaccine: never done Tdap vaccine: 07/18/2020 Shingles vaccine: never done  Covid-19: 02/04/2020, 03/04/2020  Advanced directives: Advance directive discussed with you today. I have provided a copy for you to complete at home and have notarized. Once this is complete please bring a copy in to our office so we can scan it into your chart.  Conditions/risks identified: Yes; Reviewed health maintenance screenings with patient today and relevant education, vaccines, and/or referrals were provided. Please continue to do your personal lifestyle choices by: daily care of teeth and gums, regular physical activity (goal should be 5 days a week for 30 minutes), eat a healthy diet, avoid tobacco and drug use, limiting any alcohol intake, taking a low-dose aspirin (if not allergic or have been advised by your provider otherwise) and taking vitamins and minerals as recommended by your provider. Continue doing brain stimulating activities (puzzles, reading, adult coloring books, staying active) to keep memory sharp. Continue to eat heart healthy diet (full of fruits, vegetables, whole grains, lean protein, water--limit salt, fat, and sugar intake) and increase physical activity as tolerated.  Next appointment: Please schedule your next Medicare Wellness Visit with your Nurse Health Advisor in 1 year by calling [phone].   Preventive Care 40-64  Years, Female Preventive care refers to lifestyle choices and visits with your health care provider that can promote health and wellness. What does preventive care include?  A yearly physical exam. This is also called an annual well check.  Dental exams once or twice a year.  Routine eye exams. Ask your health care provider how often you should have your eyes checked.  Personal lifestyle choices, including:  Daily care of your teeth and gums.  Regular physical activity.  Eating a healthy diet.  Avoiding tobacco and drug use.  Limiting alcohol use.  Practicing safe sex.  Taking low-dose aspirin daily starting at age 85.  Taking vitamin and mineral supplements as recommended by your health care provider. What happens during an annual well check? The services and screenings done by your health care provider during your annual well check will depend on your age, overall health, lifestyle risk factors, and family history of disease. Counseling  Your health care provider may ask you questions about your:  Alcohol use.  Tobacco use.  Drug use.  Emotional well-being.  Home and relationship well-being.  Sexual activity.  Eating habits.  Work and work Statistician.  Method of birth control.  Menstrual cycle.  Pregnancy history. Screening  You may have the following tests or measurements:  Height, weight, and BMI.  Blood pressure.  Lipid and cholesterol levels. These may be checked every 5 years, or more frequently if you are over 92 years old.  Skin check.  Lung cancer screening. You may have this screening every year starting at age 61 if you have a 30-pack-year history of smoking and currently smoke or have quit within the past 15 years.  Fecal occult blood test (FOBT) of the stool. You may have this test every year starting  at age 26.  Flexible sigmoidoscopy or colonoscopy. You may have a sigmoidoscopy every 5 years or a colonoscopy every 10 years starting  at age 26.  Hepatitis C blood test.  Hepatitis B blood test.  Sexually transmitted disease (STD) testing.  Diabetes screening. This is done by checking your blood sugar (glucose) after you have not eaten for a while (fasting). You may have this done every 1-3 years.  Mammogram. This may be done every 1-2 years. Talk to your health care provider about when you should start having regular mammograms. This may depend on whether you have a family history of breast cancer.  BRCA-related cancer screening. This may be done if you have a family history of breast, ovarian, tubal, or peritoneal cancers.  Pelvic exam and Pap test. This may be done every 3 years starting at age 28. Starting at age 37, this may be done every 5 years if you have a Pap test in combination with an HPV test.  Bone density scan. This is done to screen for osteoporosis. You may have this scan if you are at high risk for osteoporosis. Discuss your test results, treatment options, and if necessary, the need for more tests with your health care provider. Vaccines  Your health care provider may recommend certain vaccines, such as:  Influenza vaccine. This is recommended every year.  Tetanus, diphtheria, and acellular pertussis (Tdap, Td) vaccine. You may need a Td booster every 10 years.  Zoster vaccine. You may need this after age 65.  Pneumococcal 13-valent conjugate (PCV13) vaccine. You may need this if you have certain conditions and were not previously vaccinated.  Pneumococcal polysaccharide (PPSV23) vaccine. You may need one or two doses if you smoke cigarettes or if you have certain conditions. Talk to your health care provider about which screenings and vaccines you need and how often you need them. This information is not intended to replace advice given to you by your health care provider. Make sure you discuss any questions you have with your health care provider. Document Released: 12/02/2015 Document Revised:  07/25/2016 Document Reviewed: 09/06/2015 Elsevier Interactive Patient Education  2017 [employer] Prevention in the Home Falls can cause injuries. They can happen to people of all ages. There are many things you can do to make your home safe and to help prevent falls. What can I do on the outside of my home?  Regularly fix the edges of walkways and driveways and fix any cracks.  Remove anything that might make you trip as you walk through a door, such as a raised step or threshold.  Trim any bushes or trees on the path to your home.  Use bright outdoor lighting.  Clear any walking paths of anything that might make someone trip, such as rocks or tools.  Regularly check to see if handrails are loose or broken. Make sure that both sides of any steps have handrails.  Any raised decks and porches should have guardrails on the edges.  Have any leaves, snow, or ice cleared regularly.  Use sand or salt on walking paths during winter.  Clean up any spills in your garage right away. This includes oil or grease spills. What can I do in the bathroom?  Use night lights.  Install grab bars by the toilet and in the tub and shower. Do not use towel bars as grab bars.  Use non-skid mats or decals in the tub or shower.  If you need to sit  down in the shower, use a plastic, non-slip stool.  Keep the floor dry. Clean up any water that spills on the floor as soon as it happens.  Remove soap buildup in the tub or shower regularly.  Attach bath mats securely with double-sided non-slip rug tape.  Do not have throw rugs and other things on the floor that can make you trip. What can I do in the bedroom?  Use night lights.  Make sure that you have a light by your bed that is easy to reach.  Do not use any sheets or blankets that are too big for your bed. They should not hang down onto the floor.  Have a firm chair that has side arms. You can use this for support while you get  dressed.  Do not have throw rugs and other things on the floor that can make you trip. What can I do in the kitchen?  Clean up any spills right away.  Avoid walking on wet floors.  Keep items that you use a lot in easy-to-reach places.  If you need to reach something above you, use a strong step stool that has a grab bar.  Keep electrical cords out of the way.  Do not use floor polish or wax that makes floors slippery. If you must use wax, use non-skid floor wax.  Do not have throw rugs and other things on the floor that can make you trip. What can I do with my stairs?  Do not leave any items on the stairs.  Make sure that there are handrails on both sides of the stairs and use them. Fix handrails that are broken or loose. Make sure that handrails are as long as the stairways.  Check any carpeting to make sure that it is firmly attached to the stairs. Fix any carpet that is loose or worn.  Avoid having throw rugs at the top or bottom of the stairs. If you do have throw rugs, attach them to the floor with carpet tape.  Make sure that you have a light switch at the top of the stairs and the bottom of the stairs. If you do not have them, ask someone to add them for you. What else can I do to help prevent falls?  Wear shoes that:  Do not have high heels.  Have rubber bottoms.  Are comfortable and fit you well.  Are closed at the toe. Do not wear sandals.  If you use a stepladder:  Make sure that it is fully opened. Do not climb a closed stepladder.  Make sure that both sides of the stepladder are locked into place.  Ask someone to hold it for you, if possible.  Clearly mark and make sure that you can see:  Any grab bars or handrails.  First and last steps.  Where the edge of each step is.  Use tools that help you move around (mobility aids) if they are needed. These include:  Canes.  Walkers.  Scooters.  Crutches.  Turn on the lights when you go into a  dark area. Replace any light bulbs as soon as they burn out.  Set up your furniture so you have a clear path. Avoid moving your furniture around.  If any of your floors are uneven, fix them.  If there are any pets around you, be aware of where they are.  Review your medicines with your doctor. Some medicines can make you feel dizzy. This can increase your chance of  falling. Ask your doctor what other things that you can do to help prevent falls. This information is not intended to replace advice given to you by your health care provider. Make sure you discuss any questions you have with your health care provider. Document Released: 09/01/2009 Document Revised: 04/12/2016 Document Reviewed: 12/10/2014 Elsevier Interactive Patient Education  2017 [employer].

## 2021-01-16 NOTE — Progress Notes (Signed)
Subjective:   Sharisse Rantz is a 50 y.o. female who presents for Medicare Annual (Subsequent) preventive examination.  Review of Systems    No ROS. Medicare Wellness Visit. Additional risk factors are reflected in social history. Cardiac Risk Factors include: dyslipidemia;family history of premature cardiovascular disease;obesity (BMI >30kg/m2)     Objective:    Today's Vitals   01/16/21 1144  BP: 122/70  Pulse: 76  Temp: 98.9 F (37.2 C)  SpO2: 97%  Weight: 180 lb (81.6 kg)  Height: 4' 11.02" (1.499 m)  PainSc: 4    Body mass index is 36.34 kg/m.  Advanced Directives 01/16/2021 07/14/2020 09/23/2019 09/16/2019 08/05/2019 07/29/2018 09/09/2017  Does Patient Have a Medical Advance Directive? No No No No No No No  Type of Advance Directive - - - - - - -  Does patient want to make changes to medical advance directive? - - - - No - Patient declined - -  Would patient like information on creating a medical advance directive? Yes (MAU/Ambulatory/Procedural Areas - Information given) Yes (MAU/Ambulatory/Procedural Areas - Information given) No - Patient declined Yes (MAU/Ambulatory/Procedural Areas - Information given) - Yes (ED - Information included in AVS) No - Patient declined    Current Medications (verified) Outpatient Encounter Medications as of 01/16/2021  Medication Sig  . albuterol (PROVENTIL) (2.5 MG/3ML) 0.083% nebulizer solution USE 1 VIAL IN NEBULIZER EVERY 6 HOURS AS NEEDED FOR WHEEZING OR SHORTNESS OF BREATH  . busPIRone (BUSPAR) 7.5 MG tablet Take 1 tablet by mouth twice daily  . cyclobenzaprine (FLEXERIL) 10 MG tablet Take 10 mg by mouth 3 (three) times daily as needed.  . diclofenac sodium (VOLTAREN) 1 % GEL Apply 2 g topically 3 (three) times daily as needed (for pain).  Marland Kitchen escitalopram (LEXAPRO) 20 MG tablet Take 1 tablet (20 mg total) by mouth daily.  . famotidine (PEPCID) 20 MG tablet TAKE 1 TABLET BY MOUTH AT BEDTIME  . fluticasone (FLONASE) 50  MCG/ACT nasal spray Place 2 sprays into both nostrils daily.  . fluticasone (FLOVENT HFA) 110 MCG/ACT inhaler Inhale 2 puffs into the lungs 2 (two) times daily.  Marland Kitchen gabapentin (NEURONTIN) 300 MG capsule Take 1 capsule (300 mg total) by mouth daily.  Marland Kitchen loratadine (CLARITIN) 10 MG tablet TAKE 1 TABLET BY MOUTH EVERY DAY-NOT COVERED  . montelukast (SINGULAIR) 10 MG tablet TAKE 1 TABLET BY MOUTH EVERYDAY AT BEDTIME  . Olopatadine HCl 0.2 % SOLN Apply 1-2 drops to eye 2 (two) times daily as needed (itching, running eyes).  . Oxycodone HCl 10 MG TABS Take 10 mg by mouth every 6 (six) hours as needed.  . polyethylene glycol powder (GLYCOLAX/MIRALAX) 17 GM/SCOOP powder Take 17 g by mouth 2 (two) times daily as needed.  . predniSONE (DELTASONE) 20 MG tablet Take 2 tablets (40 mg total) by mouth daily with breakfast.  . pregabalin (LYRICA) 150 MG capsule Take 150 mg by mouth 2 (two) times daily.  Marland Kitchen PROAIR HFA 108 (90 Base) MCG/ACT inhaler INHALE 2 PUFFS INTO LUNGS EVERY 6 HOURS AS NEEDED FOR WHEEZING OR SHORTNESS OF BREATH  . PROCTO-MED HC 2.5 % rectal cream APPLY CREAM TOPICALLY RECTALLY 2 TIMES DAILY  . senna-docusate (SENNA-S) 8.6-50 MG tablet Take 1 tablet by mouth daily.  . tamoxifen (NOLVADEX) 20 MG tablet Take 1 tablet (20 mg total) by mouth daily.  Marland Kitchen tiZANidine (ZANAFLEX) 4 MG tablet Take 4 mg by mouth as needed.   No facility-administered encounter medications on file as of 01/16/2021.  Allergies (verified) Other and Morphine and related   History: Past Medical History:  Diagnosis Date  . Allergic rhinitis   . Anal fissure   . Anemia   . Anxiety   . Arthritis   . Asthma    uses inhaler as needed  . Chronic neck pain    pain mgmt in [place]  . Depression   . Gallbladder disease   . GERD (gastroesophageal reflux disease)   . Headache(784.0)   . HTN (hypertension)    BP has been normal recently per pt and not taking meds  . Hyperthyroidism   . Obesity   . PONV (postoperative nausea  and vomiting)   . Thyroid nodule    incidental on MRI neck 2014, s/p endo eval  . Uterine fibroid    Past Surgical History:  Procedure Laterality Date  . BREAST LUMPECTOMY WITH RADIOACTIVE SEED LOCALIZATION Right 09/23/2019   Procedure: RIGHT BREAST LUMPECTOMY WITH RADIOACTIVE SEED LOCALIZATION;  Surgeon: Erroll Luna, MD;  Location: [facility];  Service: General;  Laterality: Right;  . CERVICAL SPINE SURGERY     2 disc removed  . [place] Malformation  2003  . CHOLECYSTECTOMY  1993  . DILITATION & CURRETTAGE/HYSTROSCOPY WITH NOVASURE ABLATION N/A 03/15/2014   Procedure: DILATATION & CURETTAGE/HYSTEROSCOPY WITH NOVASURE ABLATION;  Surgeon: Lovenia Kim, MD;  Location: [facility];  Service: Gynecology;  Laterality: N/A;  . Laberal tear  2001   Right Shoulder  . LAPAROSCOPIC TOTAL HYSTERECTOMY  02/03/2018  . 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 WISDOM TOOTH EXTRACTION  1992   Family History  Problem Relation Age of Onset  . Breast cancer Mother        over 58 when dx  . Diverticulitis Mother   . Arthritis Mother   . Hyperlipidemia Mother   . Hypertension Mother   . Pancreatic cancer Paternal Grandmother   . Breast cancer Paternal Aunt        x 2 pat aunts  . Irritable bowel syndrome Daughter   . Hypotension Daughter   . Alcohol abuse Son   . Alcohol abuse Paternal Uncle   . Alcohol abuse Maternal Uncle        x several  . Colon cancer Other        maternal grandmothers sister   Social History   Socioeconomic History  . Marital status: Divorced    Spouse name: Not on file  . Number of children: 2  . Years of education: Not on file  . Highest education level: Not on file  Occupational History  . Occupation: disabled  Tobacco Use  . Smoking status: Current Every Day Smoker    Packs/day: 1.00    Years: 18.00    Pack years: 18.00    Types: Cigarettes  . Smokeless tobacco: Never Used  . Tobacco comment: [facility] smoking  cessation class resource provided   Vaping Use  . Vaping Use: Never used  Substance and Sexual Activity  . Alcohol use: No    Alcohol/week: 0.0 standard drinks  . Drug use: No  . Sexual activity: Yes  Other Topics Concern  . Not on file  Social History Narrative   Lives w/fiance and daughter.   On disability.   Social Determinants of Health   Financial Resource Strain: Low Risk   . Difficulty of Paying Living Expenses: Not hard at all  Food Insecurity: No Food Insecurity  . Worried About Estate manager/land agent  of Food in the Last Year: Never true  . Ran Out of Food in the Last Year: Never true  Transportation Needs: No Transportation Needs  . Lack of Transportation (Medical): No  . Lack of Transportation (Non-Medical): No  Physical Activity: Inactive  . Days of Exercise per Week: 0 days  . Minutes of Exercise per Session: 0 min  Stress: No Stress Concern Present  . Feeling of Stress : Not at all  Social Connections: Socially Integrated  . Frequency of Communication with Friends and Family: More than three times a week  . Frequency of Social Gatherings with Friends and Family: Once a week  . Attends Religious Services: 1 to 4 times per year  . Active Member of Clubs or Organizations: No  . Attends Archivist Meetings: 1 to 4 times per year  . Marital Status: Living with partner    Tobacco Counseling Ready to quit: Not Answered Counseling given: Not Answered Comment: [facility] smoking cessation class resource provided    Clinical Intake:  Pre-visit preparation completed: Yes  Pain : 0-10 Pain Score: 4  Pain Type: Chronic pain Pain Location: Back Pain Orientation: Lower Pain Radiating Towards: n/a Pain Descriptors / Indicators: Constant,Pressure,Discomfort,Dull,Restless,Throbbing Pain Onset: More than a month ago Pain Frequency: Constant Pain Relieving Factors: Oxycodone, Lyrica, Muscle relaxers Effect of Pain on Daily Activities: Pain can diminish job performance,  lower motivation to exercise, and prevent you from completing daily tasks. Pain produces disability and affects the quality of life.  Pain Relieving Factors: Oxycodone, Lyrica, Muscle relaxers  BMI - recorded: 36.34 Nutritional Status: BMI > 30  Obese Nutritional Risks: None Diabetes: No  How often do you need to have someone help you when you read instructions, pamphlets, or other written materials from your doctor or pharmacy?: 1 - Never What is the last grade level you completed in school?: High School Graduate  Diabetic? no  Interpreter Needed?: No  Information entered by :: Lisette Abu, LPN.   Activities of Daily Living In your present state of health, do you have any difficulty performing the following activities: 01/16/2021  Hearing? N  Vision? N  Difficulty concentrating or making decisions? N  Walking or climbing stairs? N  Dressing or bathing? N  Doing errands, shopping? N  Preparing Food and eating ? N  Using the Toilet? N  In the past six months, have you accidently leaked urine? N  Do you have problems with loss of bowel control? N  Managing your Medications? N  Managing your Finances? N  Housekeeping or managing your Housekeeping? N  Some recent data might be hidden    Patient Care Team: Hoyt Koch, MD as PCP - General (Internal Medicine) Zonia Kief, MD (Rehabilitation) Renato Shin, MD (Endocrinology) Brien Few, MD (Obstetrics and Gynecology) Laray Anger, DO (Anesthesiology) Erroll Luna, MD as Consulting Physician (General Surgery) Charlton Haws, [facility] as Pharmacist (Pharmacist)  Indicate any recent Medical Services you may have received from other than [facility] providers in the past year (date may be approximate).     Assessment:   This is a routine wellness examination for Tache.  Hearing/Vision screen No exam data present  Dietary issues and exercise activities discussed: Current Exercise Habits: The  patient does not participate in regular exercise at present, Exercise limited by: orthopedic condition(s);respiratory conditions(s)  Goals    . Patient Stated     I want to go fishing as much as possible. Enjoy nature, love family and worship God. Decrease the  amount of soda I drink and increase the amount of water I drink.       Depression Screen PHQ 2/9 Scores 01/16/2021 07/18/2020 08/05/2019 07/29/2018 06/17/2018 05/09/2015 11/04/2013  PHQ - 2 Score 0 2 3 2 3 3 1   PHQ- 9 Score 0 9 13 11 14 10  -    Fall Risk Fall Risk  01/16/2021 08/05/2019 07/29/2018 05/09/2015 11/04/2013  Falls in the past year? 0 0 No No No  Number falls in past yr: 0 0 - - -  Injury with Fall? 0 0 - - -  Risk for fall due to : No Fall Risks - - - -    FALL RISK PREVENTION PERTAINING TO THE HOME:  Any stairs in or around the home? No  If so, are there any without handrails? No  Home free of loose throw rugs in walkways, pet beds, electrical cords, etc? Yes  Adequate lighting in your home to reduce risk of falls? Yes   ASSISTIVE DEVICES UTILIZED TO PREVENT FALLS:  Life alert? No  Use of a cane, walker or w/c? No  Grab bars in the bathroom? Yes  Shower chair or bench in shower? Yes  Elevated toilet seat or a handicapped toilet? Yes   TIMED UP AND GO:  Was the test performed? No .  Length of time to ambulate 10 feet: 0 sec.   Gait steady and fast with assistive device  Cognitive Function:        Immunizations Immunization History  Administered Date(s) Administered  . Influenza,inj,Quad PF,6+ Mos 11/04/2013, 12/16/2015, 08/22/2016, 01/01/2018, 07/29/2018, 08/10/2019, 07/18/2020  . Moderna Sars-Covid-2 Vaccination 02/04/2020, 03/04/2020  . Td 03/22/2010  . Tdap 07/18/2020    TDAP status: Up to date  Flu Vaccine status: Up to date  Pneumococcal vaccine status: Declined,  Education has been provided regarding the importance of this vaccine but patient still declined. Advised may receive this vaccine  at local pharmacy or Health Dept. Aware to provide a copy of the vaccination record if obtained from local pharmacy or Health Dept. Verbalized acceptance and understanding.   Covid-19 vaccine status: Completed vaccines  Qualifies for Shingles Vaccine? No   Zostavax completed No   Shingrix Completed?: No.    Education has been provided regarding the importance of this vaccine. Patient has been advised to call insurance company to determine out of pocket expense if they have not yet received this vaccine. Advised may also receive vaccine at local pharmacy or Health Dept. Verbalized acceptance and understanding.  Screening Tests Health Maintenance  Topic Date Due  . COVID-19 Vaccine (3 - Moderna risk 4-dose series) 04/01/2020  . COLONOSCOPY (Pts 45-28yrs Insurance coverage will need to be confirmed)  08/08/2027  . TETANUS/TDAP  07/18/2030  . INFLUENZA VACCINE  Completed  . Hepatitis C Screening  Completed  . HIV Screening  Completed    Health Maintenance  Health Maintenance Due  Topic Date Due  . COVID-19 Vaccine (3 - Moderna risk 4-dose series) 04/01/2020    Colorectal cancer screening: Type of screening: Colonoscopy. Completed 08/07/2017. Repeat every 10 years  Mammogram status: Completed 09/2020. Repeat every year  Bone density status: never done  Lung Cancer Screening: (Low Dose CT Chest recommended if Age 69-80 years, 30 pack-year currently smoking OR have quit w/in 15years.) does not qualify.   Lung Cancer Screening Referral: no  Additional Screening:  Hepatitis C Screening: does qualify; Completed yes  Vision Screening: Recommended annual ophthalmology exams for early detection of glaucoma and other disorders of  the eye. Is the patient up to date with their annual eye exam?  Yes  Who is the provider or what is the name of the office in which the patient attends annual eye exams? [facility] If pt is not established with a provider, would they like to be referred to a  provider to establish care? No .   Dental Screening: Recommended annual dental exams for proper oral hygiene  Community Resource Referral / Chronic Care Management: CRR required this visit?  No   CCM required this visit?  No      Plan:     I have personally reviewed and noted the following in the patient's chart:   . Medical and social history . Use of alcohol, tobacco or illicit drugs  . Current medications and supplements . Functional ability and status . Nutritional status . Physical activity . Advanced directives . List of other physicians . Hospitalizations, surgeries, and ER visits in previous 12 months . Vitals . Screenings to include cognitive, depression, and falls . Referrals and appointments  In addition, I have reviewed and discussed with patient certain preventive protocols, quality metrics, and best practice recommendations. A written personalized care plan for preventive services as well as general preventive health recommendations were provided to patient.     Sheral Flow, LPN   [id number]   Nurse Notes:  Patient is cogitatively intact. There were no vitals filed for this visit. There is no height or weight on file to calculate BMI. Medications reviewed with patient; opioid use noted for chronic back pain.

## 2021-01-17 ENCOUNTER — Encounter: Payer: Self-pay | Admitting: Internal Medicine

## 2021-01-17 NOTE — Assessment & Plan Note (Signed)
Advised to quit and she does not feel able to make an attempt at this time.

## 2021-01-17 NOTE — Assessment & Plan Note (Signed)
Stable without flare today. 

## 2021-01-17 NOTE — Assessment & Plan Note (Signed)
Taking buspar and lexapro. Overall satisfied with control.

## 2021-01-17 NOTE — Assessment & Plan Note (Signed)
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 She also has cream from rad/onc which has not helped.

## 2021-01-17 NOTE — Assessment & Plan Note (Signed)
BP at goal off medications currently.

## 2021-01-17 NOTE — Assessment & Plan Note (Signed)
Overall improving post surgery but she is feeling like her recovery is much slower than she had hoped.

## 2021-01-17 NOTE — Assessment & Plan Note (Signed)
Flu shot up to date. Covid-19 up to date although we do not have date of booster shot. Tetanus due 2031. Colonoscopy due 2028. Mammogram due yearly, pap smear not indicated. Counseled about sun safety and mole surveillance. Counseled about the dangers of distracted driving. Given 10 year screening recommendations.

## 2021-01-24 ENCOUNTER — Telehealth: Payer: Self-pay | Admitting: Internal Medicine

## 2021-01-24 DIAGNOSIS — C50911 Malignant neoplasm of unspecified site of right female breast: Secondary | ICD-10-CM | POA: Diagnosis not present

## 2021-01-24 NOTE — Telephone Encounter (Signed)
See below

## 2021-01-24 NOTE — Telephone Encounter (Signed)
1.Medication Requested: losartan (COZAAR) 50 MG tablet    2. Pharmacy (Name, Street, [place]): [id number] - [place], [place]  3. On Med List: No   4. Last Visit with PCP: 2.28.22  5. Next visit date with PCP: 8.29.22   Agent: Please be advised that RX refills may take up to 3 business days. We ask that you follow-up with your pharmacy.

## 2021-01-24 NOTE — Telephone Encounter (Signed)
BP at goal off meds last visit. Please decline.

## 2021-01-27 ENCOUNTER — Other Ambulatory Visit: Payer: Self-pay | Admitting: Internal Medicine

## 2021-01-27 DIAGNOSIS — F418 Other specified anxiety disorders: Secondary | ICD-10-CM

## 2021-02-09 DIAGNOSIS — M961 Postlaminectomy syndrome, not elsewhere classified: Secondary | ICD-10-CM | POA: Diagnosis not present

## 2021-02-09 DIAGNOSIS — M4726 Other spondylosis with radiculopathy, lumbar region: Secondary | ICD-10-CM | POA: Diagnosis not present

## 2021-02-09 DIAGNOSIS — M4326 Fusion of spine, lumbar region: Secondary | ICD-10-CM | POA: Diagnosis not present

## 2021-02-13 ENCOUNTER — Other Ambulatory Visit: Payer: Self-pay | Admitting: Internal Medicine

## 2021-02-13 DIAGNOSIS — I10 Essential (primary) hypertension: Secondary | ICD-10-CM

## 2021-02-17 ENCOUNTER — Telehealth: Payer: Self-pay | Admitting: Pharmacist

## 2021-02-17 NOTE — Progress Notes (Signed)
    Chronic Care Management Pharmacy Assistant   Name: Emily [name]  MRN: [id number] DOB: Oct 20, 1971  Reason for Encounter: Initial Questions for Initial Visit    Recent office visits:  01/16/21 Pricilla Holm, MD Annual   Recent consult visits:  01/04/21 [facility]  Hematology 12/21/20 Lorretta Harp  Pain Medicine 11/09/20 L4-L5 Laminectomy  09/29/20 Verdell Carmine, MD -Hematology  Hospital visits:  None in previous 6 months  Medications: Outpatient Encounter Medications as of 02/17/2021  Medication Sig Note  . albuterol (PROVENTIL) (2.5 MG/3ML) 0.083% nebulizer solution USE 1 VIAL IN NEBULIZER EVERY 6 HOURS AS NEEDED FOR WHEEZING OR SHORTNESS OF BREATH   . busPIRone (BUSPAR) 7.5 MG tablet Take 1 tablet by mouth twice daily   . diclofenac sodium (VOLTAREN) 1 % GEL Apply 2 g topically 3 (three) times daily as needed (for pain).   Marland Kitchen escitalopram (LEXAPRO) 20 MG tablet Take 1 tablet (20 mg total) by mouth daily.   . famotidine (PEPCID) 20 MG tablet TAKE 1 TABLET BY MOUTH AT BEDTIME   . fluticasone (FLONASE) 50 MCG/ACT nasal spray Place 2 sprays into both nostrils daily.   . fluticasone (FLOVENT HFA) 110 MCG/ACT inhaler Inhale 2 puffs into the lungs 2 (two) times daily.   Marland Kitchen gabapentin (NEURONTIN) 300 MG capsule Take 1 capsule (300 mg total) by mouth daily.   Marland Kitchen loratadine (CLARITIN) 10 MG tablet TAKE 1 TABLET BY MOUTH EVERY DAY-NOT COVERED   . montelukast (SINGULAIR) 10 MG tablet TAKE 1 TABLET BY MOUTH EVERYDAY AT BEDTIME   . Olopatadine HCl 0.2 % SOLN Apply 1-2 drops to eye 2 (two) times daily as needed (itching, running eyes).   . Oxycodone HCl 10 MG TABS Take 10 mg by mouth every 6 (six) hours as needed.   . polyethylene glycol powder (GLYCOLAX/MIRALAX) 17 GM/SCOOP powder Take 17 g by mouth 2 (two) times daily as needed.   . pregabalin (LYRICA) 150 MG capsule Take 150 mg by mouth 2 (two) times daily.   Marland Kitchen PROAIR HFA 108 (90 Base) MCG/ACT inhaler INHALE 2 PUFFS INTO  LUNGS EVERY 6 HOURS AS NEEDED FOR WHEEZING OR SHORTNESS OF BREATH   . PROCTO-MED HC 2.5 % rectal cream APPLY CREAM TOPICALLY RECTALLY 2 TIMES DAILY   . senna-docusate (SENNA-S) 8.6-50 MG tablet Take 1 tablet by mouth daily.   . silver sulfADIAZINE (SILVADENE) 1 % cream Apply 1 application topically daily.   . tamoxifen (NOLVADEX) 20 MG tablet Take 1 tablet (20 mg total) by mouth daily. 01/16/2021: Has not started yet  . tiZANidine (ZANAFLEX) 4 MG tablet Take 4 mg by mouth as needed.    No facility-administered encounter medications on file as of 02/17/2021.     Called and spoke with patient regarding appointment on Monday 02/20/21 at 11:00am . Patient stated she needs to reschedule appointment due to Death in family.   Rescheduled Patients appointment to April 28 th,2022 at 11:00am .    Georgiana Shore ,[place] Pharmacist Assistant [phone]     Time Spent : 27 Minutes

## 2021-02-20 ENCOUNTER — Telehealth: Payer: Medicare Other

## 2021-02-28 DIAGNOSIS — M130 Polyarthritis, unspecified: Secondary | ICD-10-CM | POA: Diagnosis not present

## 2021-02-28 DIAGNOSIS — Z5181 Encounter for therapeutic drug level monitoring: Secondary | ICD-10-CM | POA: Diagnosis not present

## 2021-02-28 DIAGNOSIS — M47816 Spondylosis without myelopathy or radiculopathy, lumbar region: Secondary | ICD-10-CM | POA: Diagnosis not present

## 2021-02-28 DIAGNOSIS — M5412 Radiculopathy, cervical region: Secondary | ICD-10-CM | POA: Diagnosis not present

## 2021-02-28 DIAGNOSIS — Z981 Arthrodesis status: Secondary | ICD-10-CM | POA: Diagnosis not present

## 2021-02-28 DIAGNOSIS — M5136 Other intervertebral disc degeneration, lumbar region: Secondary | ICD-10-CM | POA: Diagnosis not present

## 2021-02-28 DIAGNOSIS — G894 Chronic pain syndrome: Secondary | ICD-10-CM | POA: Diagnosis not present

## 2021-02-28 DIAGNOSIS — G8929 Other chronic pain: Secondary | ICD-10-CM | POA: Diagnosis not present

## 2021-02-28 DIAGNOSIS — Z79899 Other long term (current) drug therapy: Secondary | ICD-10-CM | POA: Diagnosis not present

## 2021-02-28 DIAGNOSIS — M5030 Other cervical disc degeneration, unspecified cervical region: Secondary | ICD-10-CM | POA: Diagnosis not present

## 2021-03-03 DIAGNOSIS — N61 Mastitis without abscess: Secondary | ICD-10-CM | POA: Diagnosis not present

## 2021-03-06 DIAGNOSIS — Z9011 Acquired absence of right breast and nipple: Secondary | ICD-10-CM | POA: Diagnosis not present

## 2021-03-06 DIAGNOSIS — N6489 Other specified disorders of breast: Secondary | ICD-10-CM | POA: Diagnosis not present

## 2021-03-06 DIAGNOSIS — R922 Inconclusive mammogram: Secondary | ICD-10-CM | POA: Diagnosis not present

## 2021-03-06 DIAGNOSIS — N6310 Unspecified lump in the right breast, unspecified quadrant: Secondary | ICD-10-CM | POA: Diagnosis not present

## 2021-03-06 DIAGNOSIS — C50911 Malignant neoplasm of unspecified site of right female breast: Secondary | ICD-10-CM | POA: Diagnosis not present

## 2021-03-06 DIAGNOSIS — N6459 Other signs and symptoms in breast: Secondary | ICD-10-CM | POA: Diagnosis not present

## 2021-03-06 DIAGNOSIS — L7634 Postprocedural seroma of skin and subcutaneous tissue following other procedure: Secondary | ICD-10-CM | POA: Diagnosis not present

## 2021-03-06 DIAGNOSIS — Z853 Personal history of malignant neoplasm of breast: Secondary | ICD-10-CM | POA: Diagnosis not present

## 2021-03-06 DIAGNOSIS — I10 Essential (primary) hypertension: Secondary | ICD-10-CM | POA: Diagnosis not present

## 2021-03-07 ENCOUNTER — Other Ambulatory Visit: Payer: Self-pay | Admitting: Internal Medicine

## 2021-03-10 DIAGNOSIS — N6489 Other specified disorders of breast: Secondary | ICD-10-CM | POA: Diagnosis not present

## 2021-03-10 DIAGNOSIS — N6001 Solitary cyst of right breast: Secondary | ICD-10-CM | POA: Diagnosis not present

## 2021-03-15 ENCOUNTER — Telehealth: Payer: Self-pay

## 2021-03-15 DIAGNOSIS — I972 Postmastectomy lymphedema syndrome: Secondary | ICD-10-CM | POA: Diagnosis not present

## 2021-03-15 DIAGNOSIS — R600 Localized edema: Secondary | ICD-10-CM | POA: Diagnosis not present

## 2021-03-15 DIAGNOSIS — C50911 Malignant neoplasm of unspecified site of right female breast: Secondary | ICD-10-CM | POA: Diagnosis not present

## 2021-03-15 DIAGNOSIS — Z9011 Acquired absence of right breast and nipple: Secondary | ICD-10-CM | POA: Diagnosis not present

## 2021-03-15 DIAGNOSIS — M79621 Pain in right upper arm: Secondary | ICD-10-CM | POA: Diagnosis not present

## 2021-03-15 NOTE — Progress Notes (Signed)
Chronic Care Management Pharmacy Assistant   Name: Emily [name]  MRN: [id number] DOB: May 20, 1971   Reason for Encounter: Initial Questions Appointment: Telephone 03/16/21 - 11 am   Recent office visits:       11/22/20 [name] (PCP) - Back pain, lumbosacral. Med changes            start Polyethylene Glycol 3350 prn, prednisone daily & Bactrim 2x       Day. Shower stool ordered.     01/16/21 [name] (PCP) - 6 mo f/u for general exam. Start silver         sulfadiazine. Increase from 5 mg to 7.5 mg. Stop prednisone. F/u        in 6 mo.  Recent consult visits:      03/06/21 [name] (Breast Surgery) - Edema of breast. Continue taking         your antibiotic.Discuss anti-estrogen medications with your                  medical oncologist       12/21/20 [name] [facility]) - lumbar fusion. F/u 2 months.  01/04/21 [name] ( [place]) - f/u Invasive ductal carcinoma of breast  12/21/20 [name] [facility]) - lumbar fusion. F/u 2 months.  11/04/20 [name] - (Cardiology) - preprocedural laboratory examination  11/01/20 [name] (Ortho Surgeon) - Spondylolisthesis, lumbar region. Encounter for fitting and adjustment of other specified devices  10/18/20 [facility] (Oncology) - Malignant neoplasm of right female breast  10/07/20 [name] ([facility]) - Lumbar radiculopathy, chronic   09/29/20 [name] (Hematology/Oncology) - Mild intermittent asthma, uncomplicated, Malignant neoplasm of unspecified site of right female breast ([place]), HTN, Estrogen receptor, Iron.  09/26/20 [name] (General Surgery) - Other specified disorders of breast  09/22/20 [name] (Physical Med & Rehab) - Radiculopathy, lumbar region, Spondylolisthesis, lumbar region.  09/15/20 [facility] - Malignant neoplasm of unspecified site of right female breast   Hospital visits:  None in previous 6 months  Medications: Outpatient Encounter Medications as of 03/15/2021  Medication Sig Note  . albuterol  (PROVENTIL) (2.5 MG/3ML) 0.083% nebulizer solution USE 1 VIAL IN NEBULIZER EVERY 6 HOURS AS NEEDED FOR WHEEZING OR SHORTNESS OF BREATH   . busPIRone (BUSPAR) 7.5 MG tablet Take 1 tablet by mouth twice daily   . diclofenac sodium (VOLTAREN) 1 % GEL Apply 2 g topically 3 (three) times daily as needed (for pain).   Marland Kitchen escitalopram (LEXAPRO) 20 MG tablet Take 1 tablet (20 mg total) by mouth daily.   . famotidine (PEPCID) 20 MG tablet TAKE 1 TABLET BY MOUTH AT BEDTIME   . fluticasone (FLONASE) 50 MCG/ACT nasal spray Place 2 sprays into both nostrils daily.   . fluticasone (FLOVENT HFA) 110 MCG/ACT inhaler Inhale 2 puffs into the lungs 2 (two) times daily.   Marland Kitchen gabapentin (NEURONTIN) 300 MG capsule Take 1 capsule (300 mg total) by mouth daily.   Marland Kitchen loratadine (CLARITIN) 10 MG tablet TAKE 1 TABLET BY MOUTH EVERY DAY-NOT COVERED   . montelukast (SINGULAIR) 10 MG tablet TAKE 1 TABLET BY MOUTH EVERYDAY AT BEDTIME   . Olopatadine HCl 0.2 % SOLN Apply 1-2 drops to eye 2 (two) times daily as needed (itching, running eyes).   . Oxycodone HCl 10 MG TABS Take 10 mg by mouth every 6 (six) hours as needed.   . polyethylene glycol powder (GLYCOLAX/MIRALAX) 17 GM/SCOOP powder Take 17 g by mouth 2 (two) times daily as needed.   . pregabalin (LYRICA) 150 MG  capsule Take 150 mg by mouth 2 (two) times daily.   Marland Kitchen PROAIR HFA 108 (90 Base) MCG/ACT inhaler INHALE 2 PUFFS INTO LUNGS EVERY 6 HOURS AS NEEDED FOR WHEEZING OR SHORTNESS OF BREATH   . PROCTO-MED HC 2.5 % rectal cream APPLY CREAM TOPICALLY RECTALLY 2 TIMES DAILY   . senna-docusate (SENNA-S) 8.6-50 MG tablet Take 1 tablet by mouth daily.   . silver sulfADIAZINE (SILVADENE) 1 % cream Apply 1 application topically daily.   . tamoxifen (NOLVADEX) 20 MG tablet Take 1 tablet (20 mg total) by mouth daily. 01/16/2021: Has not started yet  . tiZANidine (ZANAFLEX) 4 MG tablet Take 4 mg by mouth as needed.    No facility-administered encounter medications on file as of  03/15/2021.    Have you seen any other providers since your last visit?  Patient states she recently seen her Radiation, and pain management doctors. She also states she is going to see to the lymphodema clinic today.  Any changes in your medications or health?  Patient states no changes in medications.  Any side effects from any medications?  Patient states no side effects at this time.  Do you have an symptoms or problems not managed by your medications?  Patient states no problems at this time.  Any concerns about your health right now?  Patient states she would like to regain her full energy level.  Has your provider asked that you check blood pressure, blood sugar, or follow special diet at home?  Patient states she checks her BP atleast 2x a week but doesn't record readings. She last took it on 03/13/21 and it was 128/70. She does not check her glucose or follow a Diet paln.  Do you get any type of exercise on a regular basis?  Patient states she currently does physical therapy.  Can you think of a goal you would like to reach for your health?  Patient states she would love to go back to work.  Do you have any problems getting your medications?  Patient states not at this time but sometimes they lose her prescriptions when time for refills.  Is there anything that you would like to discuss during the appointment?  Patient is interesting in learning more about [facility].  Please bring medications and supplements to appointment   Star Rating Drugs: N/a  Orinda Kenner, RMA Clinical Pharmacists Assistant [phone]  Time Spent: [id number]

## 2021-03-16 ENCOUNTER — Ambulatory Visit (INDEPENDENT_AMBULATORY_CARE_PROVIDER_SITE_OTHER): Payer: Medicare Other | Admitting: Pharmacist

## 2021-03-16 ENCOUNTER — Other Ambulatory Visit: Payer: Self-pay

## 2021-03-16 ENCOUNTER — Telehealth: Payer: Self-pay | Admitting: Pharmacist

## 2021-03-16 DIAGNOSIS — J4520 Mild intermittent asthma, uncomplicated: Secondary | ICD-10-CM | POA: Diagnosis not present

## 2021-03-16 DIAGNOSIS — Z170 Estrogen receptor positive status [ER+]: Secondary | ICD-10-CM | POA: Diagnosis not present

## 2021-03-16 DIAGNOSIS — I10 Essential (primary) hypertension: Secondary | ICD-10-CM

## 2021-03-16 DIAGNOSIS — Z720 Tobacco use: Secondary | ICD-10-CM

## 2021-03-16 DIAGNOSIS — C50919 Malignant neoplasm of unspecified site of unspecified female breast: Secondary | ICD-10-CM | POA: Diagnosis not present

## 2021-03-16 DIAGNOSIS — M545 Low back pain, unspecified: Secondary | ICD-10-CM

## 2021-03-16 DIAGNOSIS — K649 Unspecified hemorrhoids: Secondary | ICD-10-CM

## 2021-03-16 DIAGNOSIS — M79604 Pain in right leg: Secondary | ICD-10-CM | POA: Diagnosis not present

## 2021-03-16 DIAGNOSIS — M5459 Other low back pain: Secondary | ICD-10-CM | POA: Diagnosis not present

## 2021-03-16 DIAGNOSIS — F418 Other specified anxiety disorders: Secondary | ICD-10-CM

## 2021-03-16 DIAGNOSIS — F411 Generalized anxiety disorder: Secondary | ICD-10-CM

## 2021-03-16 DIAGNOSIS — K219 Gastro-esophageal reflux disease without esophagitis: Secondary | ICD-10-CM

## 2021-03-16 DIAGNOSIS — J301 Allergic rhinitis due to pollen: Secondary | ICD-10-CM
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
## 2021-03-16 NOTE — Progress Notes (Signed)
    Chronic Care Management Pharmacy Assistant   Name: Emily [name]  MRN: [id number] DOB: 06-08-1971    Reason for Encounter: Chart Review    Recent office visits:  None ID  Recent consult visits: None ID  Hospital visits:  None in previous 6 months  Medications: Outpatient Encounter Medications as of 03/16/2021  Medication Sig Note  . albuterol (PROVENTIL) (2.5 MG/3ML) 0.083% nebulizer solution USE 1 VIAL IN NEBULIZER EVERY 6 HOURS AS NEEDED FOR WHEEZING OR SHORTNESS OF BREATH   . busPIRone (BUSPAR) 7.5 MG tablet Take 1 tablet by mouth twice daily   . diclofenac sodium (VOLTAREN) 1 % GEL Apply 2 g topically 3 (three) times daily as needed (for pain).   Marland Kitchen escitalopram (LEXAPRO) 20 MG tablet Take 1 tablet (20 mg total) by mouth daily.   . famotidine (PEPCID) 20 MG tablet TAKE 1 TABLET BY MOUTH AT BEDTIME   . fluticasone (FLONASE) 50 MCG/ACT nasal spray Place 2 sprays into both nostrils daily.   . fluticasone (FLOVENT HFA) 110 MCG/ACT inhaler Inhale 2 puffs into the lungs 2 (two) times daily.   Marland Kitchen gabapentin (NEURONTIN) 300 MG capsule Take 1 capsule (300 mg total) by mouth daily.   Marland Kitchen loratadine (CLARITIN) 10 MG tablet TAKE 1 TABLET BY MOUTH EVERY DAY-NOT COVERED   . montelukast (SINGULAIR) 10 MG tablet TAKE 1 TABLET BY MOUTH EVERYDAY AT BEDTIME   . Olopatadine HCl 0.2 % SOLN Apply 1-2 drops to eye 2 (two) times daily as needed (itching, running eyes).   . Oxycodone HCl 10 MG TABS Take 10 mg by mouth every 6 (six) hours as needed.   . polyethylene glycol powder (GLYCOLAX/MIRALAX) 17 GM/SCOOP powder Take 17 g by mouth 2 (two) times daily as needed.   . pregabalin (LYRICA) 150 MG capsule Take 150 mg by mouth 2 (two) times daily.   Marland Kitchen PROAIR HFA 108 (90 Base) MCG/ACT inhaler INHALE 2 PUFFS INTO LUNGS EVERY 6 HOURS AS NEEDED FOR WHEEZING OR SHORTNESS OF BREATH   . PROCTO-MED HC 2.5 % rectal cream APPLY CREAM TOPICALLY RECTALLY 2 TIMES DAILY   . senna-docusate (SENNA-S) 8.6-50 MG  tablet Take 1 tablet by mouth daily.   . silver sulfADIAZINE (SILVADENE) 1 % cream Apply 1 application topically daily.   . tamoxifen (NOLVADEX) 20 MG tablet Take 1 tablet (20 mg total) by mouth daily. 01/16/2021: Has not started yet  . tiZANidine (ZANAFLEX) 4 MG tablet Take 4 mg by mouth as needed.    No facility-administered encounter medications on file as of 03/16/2021.   Pharmacist Review  The patient had an initial televisit with Clinical pharmacist Charlene Brooke on 03/16/21. Upon completion of the visit the patient has agreed to try [facility] services for their dispensing and delivery of medications. Per clinical pharmacist request I completed an on-boarding form with the list of the patients medications, current pharmacy, demographics, allergies and insurance information. The form was then forward to clinical pharmacist for review.   [place] Pharmacist Assistant [phone]  Time spent:60

## 2021-03-17 ENCOUNTER — Encounter: Payer: Self-pay | Admitting: Pharmacist

## 2021-03-17 ENCOUNTER — Other Ambulatory Visit: Payer: Self-pay | Admitting: Internal Medicine

## 2021-03-17 DIAGNOSIS — J4520 Mild intermittent asthma, uncomplicated: Secondary | ICD-10-CM

## 2021-03-17 DIAGNOSIS — F418 Other specified anxiety disorders: Secondary | ICD-10-CM

## 2021-03-17 MED ORDER — HYDROCORTISONE (PERIANAL) 2.5 % EX CREA: TOPICAL_CREAM | Freq: Two times a day (BID) | CUTANEOUS | 0 refills | Status: DC

## 2021-03-17 MED ORDER — PROPRANOLOL HCL 10 MG PO TABS: 10.0000 mg | ORAL_TABLET | Freq: Every day | ORAL | 0 refills | Status: DC | PRN

## 2021-03-17 MED ORDER — NICOTINE 21 MG/24HR TD PT24: 21.0000 mg | MEDICATED_PATCH | Freq: Every day | TRANSDERMAL | 0 refills | Status: DC

## 2021-03-17 MED ORDER — ESCITALOPRAM OXALATE 20 MG PO TABS: 1.0000 | ORAL_TABLET | Freq: Every day | ORAL | 1 refills | Status: DC

## 2021-03-17 MED ORDER — LORATADINE 10 MG PO TABS: 10.0000 mg | ORAL_TABLET | Freq: Every day | ORAL | 0 refills | Status: DC

## 2021-03-17 MED ORDER — OLOPATADINE HCL 0.2 % OP SOLN: 1.0000 [drp] | Freq: Two times a day (BID) | OPHTHALMIC | 2 refills | Status: AC | PRN

## 2021-03-17 MED ORDER — BUSPIRONE HCL 7.5 MG PO TABS: 7.5000 mg | ORAL_TABLET | Freq: Two times a day (BID) | ORAL | 0 refills | Status: DC

## 2021-03-17 MED ORDER — FAMOTIDINE 20 MG PO TABS: 20.0000 mg | ORAL_TABLET | Freq: Every day | ORAL | 0 refills | Status: DC

## 2021-03-17 MED ORDER — NICOTINE POLACRILEX 2 MG MT LOZG: 2.0000 mg | LOZENGE | OROMUCOSAL | 0 refills | Status: DC | PRN

## 2021-03-17 NOTE — Patient Instructions (Addendum)
Visit Information  Phone number for Pharmacist: [phone]  Thank you for meeting with me to discuss your medications! I look forward to working with you to achieve your health care goals. Below is a summary of what we talked about during the visit:  Goals Addressed            This Visit's Progress   . Manage My Medicine       Timeframe:  Long-Range Goal Priority:  High Start Date:     03/17/21                        Expected End Date: 06/17/21                      Follow Up Date 04/17/21   - call for medicine refill 2 or 3 days before it runs out - call if I am sick and can't take my medicine - keep a list of all the medicines I take; vitamins and herbals too  -Utilize [facility] for medication synchronization, packaging and delivery -Try propranolol 10 mg as needed for physical symptoms of anxiety    Why is this important?   . These steps will help you keep on track with your medicines.   Notes:     . Stop or Cut Down Tobacco Use       Timeframe:  Long-Range Goal Priority:  High Start Date:       03/17/21                      Expected End Date:    06/17/21                   Follow Up Date 04/17/21   - change or avoid triggers like smoky places, drinking alcohol and other smokers - cut down number of cigarettes by one-half - use Quit [employer] Now  -start Nicotine patch 21 mg x 6 weeks, then 14 mg x 2 weeks, then 7 mg x 2 weeks -Start Nicotine lozenge 2 mg PRN for breakthrough cravings (up to every 1-2 hrs while awake)   Why is this important?    To stop or cut down it is important to have support from a person or group of people who you can count on.   You will also need to think about the things that make you feel like smoking, then plan for how to handle them.    Notes:        Emily [name] was given information about Chronic Care Management services today including:  1. CCM service includes personalized support from designated clinical staff  supervised by her physician, including individualized plan of care and coordination with other care providers 2. 24/7 contact phone numbers for assistance for urgent and routine care needs. 3. Standard insurance, coinsurance, copays and deductibles apply for chronic care management only during months in which we provide at least 20 minutes of these services. Most insurances cover these services at 100%, however patients may be responsible for any copay, coinsurance and/or deductible if applicable. This service may help you avoid the need for more expensive face-to-face services. 4. Only one practitioner may furnish and bill the service in a calendar month. 5. The patient may stop CCM services at any time (effective at the end of the month) by phone call to the office staff.  Patient agreed to services and verbal consent obtained.  Patient verbalizes understanding of instructions provided today and agrees to view in [place].  Telephone follow up appointment with pharmacy team member scheduled for: 1 month  Charlene Brooke, PharmD, [place], CPP Clinical Pharmacist [facility] [phone]  Managing the Challenge of Quitting Smoking Quitting smoking is a physical and mental challenge. You will face cravings, withdrawal symptoms, and temptation. Before quitting, work with your health care provider to make a plan that can help you manage quitting. Preparation can help you quit and keep you from giving in. How to manage lifestyle changes Managing stress Stress can make you want to smoke, and wanting to smoke may cause stress. It is important to find ways to manage your stress. You might try some of the following:  Practice relaxation techniques. ? Breathe slowly and deeply, in through your nose and out through your mouth. ? Listen to music. ? Soak in a bath or take a shower. ? Imagine a peaceful place or vacation.  Get some support. ? Talk with family or friends about  your stress. ? Join a support group. ? Talk with a counselor or therapist.  Get some physical activity. ? Go for a walk, run, or bike ride. ? Play a favorite sport. ? Practice yoga.   Medicines Talk with your health care provider about medicines that might help you deal with cravings and make quitting easier for you. Relationships Social situations can be difficult when you are quitting smoking. To manage this, you can:  Avoid parties and other social situations where people might be smoking.  Avoid alcohol.  Leave right away if you have the urge to smoke.  Explain to your family and friends that you are quitting smoking. Ask for support and let them know you might be a bit grumpy.  Plan activities where smoking is not an option. General instructions Be aware that many people gain weight after they quit smoking. However, not everyone does. To keep from gaining weight, have a plan in place before you quit and stick to the plan after you quit. Your plan should include:  Having healthy snacks. When you have a craving, it may help to: ? Eat popcorn, carrots, celery, or other cut vegetables. ? Chew sugar-free gum.  Changing how you eat. ? Eat small portion sizes at meals. ? Eat 4-6 small meals throughout the day instead of 1-2 large meals a day. ? Be mindful when you eat. Do not watch television or do other things that might distract you as you eat.  Exercising regularly. ? Make time to exercise each day. If you do not have time for a long workout, do short bouts of exercise for 5-10 minutes several times a day. ? Do some form of strengthening exercise, such as weight lifting. ? Do some exercise that gets your heart beating and causes you to breathe deeply, such as walking fast, running, swimming, or biking. This is very important.  Drinking plenty of water or other low-calorie or no-calorie drinks. Drink 6-8 glasses of water daily.   How to recognize withdrawal symptoms Your body  and mind may experience discomfort as you try to get used to not having nicotine in your system. These effects are called withdrawal symptoms. They may include:  Feeling hungrier than normal.  Having trouble concentrating.  Feeling irritable or restless.  Having trouble sleeping.  Feeling depressed.  Craving a cigarette. To manage withdrawal symptoms:  Avoid places, people, and activities that trigger your cravings.  Remember why you  want to quit.  Get plenty of sleep.  Avoid coffee and other caffeinated drinks. These may worsen some of your symptoms. These symptoms may surprise you. But be assured that they are normal to have when quitting smoking. How to manage cravings Come up with a plan for how to deal with your cravings. The plan should include the following:  A definition of the specific situation you want to deal with.  An alternative action you will take.  A clear idea for how this action will help.  The name of someone who might help you with this. Cravings usually last for 5-10 minutes. Consider taking the following actions to help you with your plan to deal with cravings:  Keep your mouth busy. ? Chew sugar-free gum. ? Suck on hard candies or a straw. ? Brush your teeth.  Keep your hands and body busy. ? Change to a different activity right away. ? Squeeze or play with a ball. ? Do an activity or a hobby, such as making bead jewelry, practicing needlepoint, or working with wood. ? Mix up your normal routine. ? Take a short exercise break. Go for a quick walk or run up and down stairs.  Focus on doing something kind or helpful for someone else.  Call a friend or family member to talk during a craving.  Join a support group.  Contact a quitline. Where to find support To get help or find a support group:  Call the [place] [facility]'s Smoking Quitline: 1-800-QUIT NOW 909-116-6006)  Visit the website of the Substance Abuse and [place]: ktimeonline.com  Text QUIT to SmokefreeTXT: 585277 Where to find more information Visit these websites to find more information on quitting smoking:  [place]: www.smokefree.gov  American Lung Association: www.lung.org  American Cancer Society: www.cancer.org  Centers for Disease Control and Prevention: http://www.wolf.info/  American Heart Association: www.heart.org Contact a health care provider if:  You want to change your plan for quitting.  The medicines you are taking are not helping.  Your eating feels out of control or you cannot sleep. Get help right away if:  You feel depressed or become very anxious. Summary  Quitting smoking is a physical and mental challenge. You will face cravings, withdrawal symptoms, and temptation to smoke again. Preparation can help you as you go through these challenges.  Try different techniques to manage stress, handle social situations, and prevent weight gain.  You can deal with cravings by keeping your mouth busy (such as by chewing gum), keeping your hands and body busy, calling family or friends, or contacting a quitline for people who want to quit smoking.  You can deal with withdrawal symptoms by avoiding places where people smoke, getting plenty of rest, and avoiding drinks with caffeine. This information is not intended to replace advice given to you by your health care provider. Make sure you discuss any questions you have with your health care provider. Document Revised: 08/25/2019 Document Reviewed: 08/25/2019 Elsevier Patient Education  [place].

## 2021-03-17 NOTE — Addendum Note (Signed)
Addended by: Charlton Haws on: 03/17/2021 05:23 PM   Modules accepted: Orders

## 2021-03-21 NOTE — Addendum Note (Signed)
Addended by: Charlton Haws on: 03/21/2021 12:56 PM   Modules accepted: Orders

## 2021-03-22 DIAGNOSIS — M79604 Pain in right leg: Secondary | ICD-10-CM | POA: Diagnosis not present

## 2021-03-22 DIAGNOSIS — M5459 Other low back pain: Secondary | ICD-10-CM | POA: Diagnosis not present

## 2021-03-29 DIAGNOSIS — Z9011 Acquired absence of right breast and nipple: Secondary | ICD-10-CM | POA: Diagnosis not present

## 2021-03-29 DIAGNOSIS — I972 Postmastectomy lymphedema syndrome: Secondary | ICD-10-CM | POA: Diagnosis not present

## 2021-03-29 DIAGNOSIS — N6489 Other specified disorders of breast: Secondary | ICD-10-CM | POA: Diagnosis not present

## 2021-03-29 DIAGNOSIS — C50911 Malignant neoplasm of unspecified site of right female breast: Secondary | ICD-10-CM | POA: Diagnosis not present

## 2021-03-30 DIAGNOSIS — M79604 Pain in right leg: Secondary | ICD-10-CM | POA: Diagnosis not present

## 2021-03-30 DIAGNOSIS — M5459 Other low back pain: Secondary | ICD-10-CM | POA: Diagnosis not present

## 2021-04-03 DIAGNOSIS — C50911 Malignant neoplasm of unspecified site of right female breast: Secondary | ICD-10-CM | POA: Diagnosis not present

## 2021-04-05 DIAGNOSIS — M79604 Pain in right leg: Secondary | ICD-10-CM | POA: Diagnosis not present

## 2021-04-05 DIAGNOSIS — M5459 Other low back pain: Secondary | ICD-10-CM | POA: Diagnosis not present

## 2021-04-06 ENCOUNTER — Other Ambulatory Visit: Payer: Self-pay | Admitting: Internal Medicine

## 2021-04-06 DIAGNOSIS — K649 Unspecified hemorrhoids: Secondary | ICD-10-CM

## 2021-04-07 DIAGNOSIS — N6091 Unspecified benign mammary dysplasia of right breast: Secondary | ICD-10-CM | POA: Diagnosis not present

## 2021-04-07 DIAGNOSIS — K219 Gastro-esophageal reflux disease without esophagitis: Secondary | ICD-10-CM | POA: Diagnosis not present

## 2021-04-07 DIAGNOSIS — D509 Iron deficiency anemia, unspecified: Secondary | ICD-10-CM | POA: Diagnosis not present

## 2021-04-07 DIAGNOSIS — C50911 Malignant neoplasm of unspecified site of right female breast: Secondary | ICD-10-CM | POA: Diagnosis not present

## 2021-04-07 DIAGNOSIS — G894 Chronic pain syndrome: Secondary | ICD-10-CM | POA: Diagnosis not present

## 2021-04-07 DIAGNOSIS — J4520 Mild intermittent asthma, uncomplicated: Secondary | ICD-10-CM | POA: Diagnosis not present

## 2021-04-07 DIAGNOSIS — I10 Essential (primary) hypertension: Secondary | ICD-10-CM | POA: Diagnosis not present

## 2021-04-10 DIAGNOSIS — M79604 Pain in right leg: Secondary | ICD-10-CM | POA: Diagnosis not present

## 2021-04-10 DIAGNOSIS — M5459 Other low back pain: Secondary | ICD-10-CM | POA: Diagnosis not present

## 2021-04-11 DIAGNOSIS — C50911 Malignant neoplasm of unspecified site of right female breast: Secondary | ICD-10-CM | POA: Diagnosis not present

## 2021-04-11 DIAGNOSIS — I972 Postmastectomy lymphedema syndrome: Secondary | ICD-10-CM | POA: Diagnosis not present

## 2021-04-11 DIAGNOSIS — N6489 Other specified disorders of breast: Secondary | ICD-10-CM | POA: Diagnosis not present

## 2021-04-11 DIAGNOSIS — Z9011 Acquired absence of right breast and nipple: Secondary | ICD-10-CM | POA: Diagnosis not present

## 2021-04-12 DIAGNOSIS — M79604 Pain in right leg: Secondary | ICD-10-CM | POA: Diagnosis not present

## 2021-04-12 DIAGNOSIS — M5459 Other low back pain: Secondary | ICD-10-CM | POA: Diagnosis not present

## 2021-04-13 ENCOUNTER — Ambulatory Visit (INDEPENDENT_AMBULATORY_CARE_PROVIDER_SITE_OTHER): Payer: Medicare Other | Admitting: Pharmacist

## 2021-04-13 ENCOUNTER — Other Ambulatory Visit: Payer: Self-pay

## 2021-04-13 DIAGNOSIS — F418 Other specified anxiety disorders: Secondary | ICD-10-CM | POA: Diagnosis not present

## 2021-04-13 DIAGNOSIS — Z720 Tobacco use: Secondary | ICD-10-CM

## 2021-04-13 DIAGNOSIS — C50919 Malignant neoplasm of unspecified site of unspecified female breast: Secondary | ICD-10-CM | POA: Diagnosis not present

## 2021-04-13 DIAGNOSIS — J4520 Mild intermittent asthma, uncomplicated: Secondary | ICD-10-CM | POA: Diagnosis not present

## 2021-04-13 DIAGNOSIS — Z170 Estrogen receptor positive status [ER+]: Secondary | ICD-10-CM | POA: Diagnosis not present

## 2021-04-13 NOTE — Progress Notes (Signed)
Chronic Care Management Pharmacy Note  04/13/2021 Name:  Emily [name] MRN:  [id number] DOB:  1971/10/21  Subjective: Emily [name] is an 50 y.o. year old female who is a primary patient of Emily Koch, MD.  The CCM team was consulted for assistance with disease management and care coordination needs.    Engaged with patient by telephone for follow up visit in response to provider referral for pharmacy case management and/or care coordination services.   Consent to Services:  The patient was given information about Chronic Care Management services, agreed to services, and gave verbal consent prior to initiation of services.  Please see initial visit note for detailed documentation.   Patient Care Team: Emily Koch, MD as PCP - General (Internal Medicine) Emily Kief, MD (Rehabilitation) Emily Shin, MD (Endocrinology) Emily Few, MD (Obstetrics and Gynecology) Emily Anger, DO (Anesthesiology) Emily Luna, MD as Consulting Physician (General Surgery) Emily [name], [facility] as Pharmacist (Pharmacist)  Patient lived in [place] as a child, has lived in [place] and [place] as well. Pt was married for 15 years, now divorced. She has been in [place] for 13 years. She lives with SO and her daughter lives down the street. Try to get things do. She is on disability due to work injury.   Recent office visits: 11/22/20 Emily [name] (PCP) - Back pain, lumbosacral. Med changes start Miralax PRN, prednisone daily & Bactrim 2x Day. Shower stool ordered.  01/16/21 Emily [name] (PCP) - 6 mo f/u for general exam. Start silver sulfadiazine for radiation burns to prevent infection.. Increase buspar from 5 mg to 7.5 mg BID. Stop prednisone. F/u in 6 mo.   Recent consult visits: 04/11/21 Dr Emily [name] breast surgery): laser treatment 04/07/21 Dr Emily [name] [facility] oncology): f/u breast cancer. Strongly advised pt to start tamoxifen even while smoking,  she has taken birth control before and not had blood clots. 03/29/21 Dr Emily [name] [facility] rehab): begin sequential gradient pneumatic pump for stimulating lymph flow in arm.  03/06/21 Emily [name] (Breast Surgery) - Edema of breast. Continue taking antibiotic. Discuss anti-estrogen medications with medical oncologist  12/21/20 Emily [name] [facility]) - lumbar fusion. F/u 2 months.  01/04/21 Emily [name] ([place]) - f/u Invasive ductal carcinoma of breast. Advised to start tamoxifen in 1 week.  12/21/20 Emily [name] ([place] [facility]) - lumbar fusion. F/u 2 months  11/04/20 Emily [name] - (Cardiology) - preprocedural laboratory examination 11/01/20 Emily [name] (Ortho Surgeon) - Spondylolisthesis, lumbar region. Encounter for fitting and adjustment of other specified devices 10/18/20 Emily [name] (Oncology) - Malignant neoplasm of right female breas 10/07/20 Emily [name] ([facility]) - Lumbar radiculopathy, chronic  09/29/20 Emily [name] (Hematology/Oncology) - Mild intermittent asthma, uncomplicated, Malignant neoplasm of unspecified site of right female breast ([name]), HTN, Estrogen receptor, Iron. 09/26/20 Emily [name] (General Surgery) - Other specified disorders of breast 09/22/20 Emily [name] (Physical Med & Rehab) - Radiculopathy, lumbar region, Spondylolisthesis, lumbar region.  Hospital visits: None in previous 6 months  Objective:  Lab Results  Component Value Date   CREATININE 0.77 07/18/2020   BUN 7 07/18/2020   GFR 96.32 07/18/2020   GFRNONAA >60 09/18/2019   GFRAA >60 09/18/2019   NA 135 07/18/2020   K 4.2 07/18/2020   CALCIUM 9.6 07/18/2020   CO2 22 07/18/2020   GLUCOSE 109 (H) 07/18/2020    Lab Results  Component Value Date/Time   HGBA1C 5.5 07/18/2020 12:25 PM   HGBA1C 5.4 06/17/2018 11:28 AM   GFR 96.32 07/18/2020 12:25 PM   GFR 98.82 06/17/2018 11:28 AM  Last diabetic Eye exam: No results found for: HMDIABEYEEXA  Last diabetic Foot exam: No results found for: HMDIABFOOTEX   Lab Results  Component Value Date    CHOL 163 07/18/2020   HDL 24.40 (L) 07/18/2020   LDLCALC 110 (H) 07/18/2020   LDLDIRECT 74.0 06/14/2015   TRIG 146.0 07/18/2020   CHOLHDL 7 07/18/2020    Hepatic Function Latest Ref Rng & Units 07/18/2020 09/18/2019 06/17/2018  Total Protein 6.0 - 8.3 g/dL 7.8 7.1 7.9  Albumin 3.5 - 5.2 g/dL 4.4 3.8 4.5  AST 0 - 37 U/L _0 ALT 0 - 35 U/L _1 Alk Phosphatase 39 - 117 U/L 101 84 92  Total Bilirubin 0.2 - 1.2 mg/dL 0.6 0.6 0.7  Bilirubin, Direct 0.0 - 0.3 mg/dL 0.1 - -    Lab Results  Component Value Date/Time   TSH 0.46 07/18/2020 12:25 PM   TSH 0.79 06/27/2016 11:05 AM   FREET4 0.85 06/27/2016 11:05 AM   FREET4 0.66 12/16/2015 10:30 AM    CBC Latest Ref Rng & Units 07/18/2020 09/18/2019 06/17/2018  WBC 4.0 - 10.5 K/uL 10.5 9.5 10.7(H)  Hemoglobin 12.0 - 15.0 g/dL 14.0 13.3 14.4  Hematocrit 36.0 - 46.0 % 41.3 38.9 42.3  Platelets 150.0 - 400.0 K/uL 280.0 254 269.0    No results found for: VD25OH  Clinical ASCVD: No  The 10-year ASCVD risk score Mikey Bussing DC Jr., et al., 2013) is: 7.7%   Values used to calculate the score:     Age: 89 years     Sex: Female     Is Non-Hispanic African American: Yes     Diabetic: No     Tobacco smoker: Yes     Systolic Blood Pressure: 355 mmHg     Is BP treated: No     HDL Cholesterol: 24.4 mg/dL     Total Cholesterol: 163 mg/dL    Depression screen [facility] 2/9 01/16/2021 07/18/2020 08/05/2019  Decreased Interest 0 2 2  Down, Depressed, Hopeless 0 0 1  PHQ - 2 Score 0 2 3  Altered sleeping 0 3 2  Tired, decreased energy 0 0 2  Change in appetite 0 1 2  Feeling bad or failure about yourself  0 1 2  Trouble concentrating 0 2 1  Moving slowly or fidgety/restless 0 0 0  Suicidal thoughts 0 0 1  PHQ-9 Score 0 9 13  Difficult doing work/chores - - Somewhat difficult      Social History   Tobacco Use  Smoking Status Current Every Day Smoker  . Packs/day: 1.00  . Years: 18.00  . Pack years: 18.00  . Types: Cigarettes   Smokeless Tobacco Never Used  Tobacco Comment   starting NRT patches/lozenges 03/2021   BP Readings from Last 3 Encounters:  01/16/21 122/70  01/16/21 122/70  08/17/20 116/76   Pulse Readings from Last 3 Encounters:  01/16/21 76  01/16/21 76  08/17/20 94   Wt Readings from Last 3 Encounters:  01/16/21 180 lb (81.6 kg)  01/16/21 180 lb (81.6 kg)  08/17/20 167 lb (75.8 kg)   BMI Readings from Last 3 Encounters:  01/16/21 36.34 kg/m  01/16/21 36.36 kg/m  08/17/20 33.73 kg/m    Assessment/Interventions: Review of patient past medical history, allergies, medications, health status, including review of consultants reports, laboratory and other test data, was performed as part of comprehensive evaluation and provision of chronic care management services.   SDOH:  (Social Determinants of Health) assessments  and interventions performed: Yes  SDOH Screenings   Alcohol Screen: Low Risk   . Last Alcohol Screening Score (AUDIT): 0  Depression (PHQ2-9): Low Risk   . PHQ-2 Score: 0  Financial Resource Strain: Low Risk   . Difficulty of Paying Living Expenses: Not hard at all  Food Insecurity: No Food Insecurity  . Worried About Charity fundraiser in the Last Year: Never true  . Ran Out of Food in the Last Year: Never true  Housing: Low Risk   . Last Housing Risk Score: 0  Physical Activity: Inactive  . Days of Exercise per Week: 0 days  . Minutes of Exercise per Session: 0 min  Social Connections: Socially Integrated  . Frequency of Communication with Friends and Family: More than three times a week  . Frequency of Social Gatherings with Friends and Family: Once a week  . Attends Religious Services: 1 to 4 times per year  . Active Member of Clubs or Organizations: No  . Attends Archivist Meetings: 1 to 4 times per year  . Marital Status: Living with partner  Stress: No Stress Concern Present  . Feeling of Stress : Not at all  Tobacco Use: High Risk  . Smoking  Tobacco Use: Current Every Day Smoker  . Smokeless Tobacco Use: Never Used  Transportation Needs: No Transportation Needs  . Lack of Transportation (Medical): No  . Lack of Transportation (Non-Medical): No    CCM Care Plan  Allergies  Allergen Reactions  . Other Hives    Dermabond  . Morphine And Related     Lowers BP too much    Medications Reviewed Today    Reviewed by Emily [name], [employer] (Pharmacist) on 04/13/21 at 1201  Med List Status: <None>  Medication Order Taking? Sig Documenting Provider Last Dose Status Informant  albuterol (PROVENTIL) (2.5 MG/3ML) 0.083% nebulizer solution [id number] Yes USE 1 VIAL IN NEBULIZER EVERY 6 HOURS AS NEEDED FOR WHEEZING OR SHORTNESS OF Cherly Beach, MD Taking Active   busPIRone (BUSPAR) 7.5 MG tablet [id number] Yes Take 1 tablet (7.5 mg total) by mouth 2 (two) times daily. Emily Koch, MD Taking Active   cyclobenzaprine (FLEXERIL) 10 MG tablet [id number] Yes Take 10 mg by mouth daily as needed for muscle spasms. [provider] Taking Active   diclofenac sodium (VOLTAREN) 1 % GEL [id number] Yes Apply 2 g topically 3 (three) times daily as needed (for pain). [provider] Taking Active Self  escitalopram (LEXAPRO) 20 MG tablet [id number] Yes Take 1 tablet (20 mg total) by mouth daily. Emily Koch, MD Taking Active   famotidine (PEPCID) 20 MG tablet [id number] Yes Take 1 tablet (20 mg total) by mouth at bedtime. Emily Koch, MD Taking Active   fluticasone [facility]) 50 MCG/ACT nasal spray [id number] Yes Place 2 sprays into both nostrils daily. Emily Koch, MD Taking Active   fluticasone [facility]) 110 MCG/ACT inhaler [id number] Yes Inhale 2 puffs into the lungs 2 (two) times daily. Emily Koch, MD Taking Active   loratadine (CLARITIN) 10 MG tablet [id number] Yes Take 1 tablet (10 mg total) by mouth daily. Emily Koch, MD Taking Active   metaxalone [facility]) 800  MG tablet [id number] Yes Take 800 mg by mouth daily as needed for muscle spasms. [provider] Taking Active   montelukast (SINGULAIR) 10 MG tablet [id number] Yes TAKE 1 TABLET BY MOUTH EVERYDAY AT BEDTIME Emily Koch, MD Taking Active   nicotine (  NICOTINE STEP 1) 21 mg/24hr patch [id number] Yes Place 1 patch (21 mg total) onto the skin daily. Remove patch 30 min before bedtime Emily Koch, MD Taking Active   nicotine polacrilex (COMMIT) 2 MG lozenge [id number] Yes Take 1 lozenge (2 mg total) by mouth as needed (every 1-2 hours for cravings). Emily Koch, MD Taking Active   Olopatadine HCl 0.2 % SOLN [id number] Yes Apply 1-2 drops to eye 2 (two) times daily as needed (itching, running eyes). Emily Koch, MD Taking Active   Oxycodone HCl 10 MG TABS [id number] Yes Take 10 mg by mouth every 6 (six) hours as needed. [provider] Taking Active   polyethylene glycol powder (GLYCOLAX/MIRALAX) 17 GM/SCOOP powder [id number] Yes Take 17 g by mouth 2 (two) times daily as needed. Emily Koch, MD Taking Active   pregabalin (LYRICA) 150 MG capsule [id number] Yes Take 150 mg by mouth 2 (two) times daily. [provider] Taking Active   PROAIR HFA 108 [phone] Base) MCG/ACT inhaler [id number] Yes INHALE 2 PUFFS INTO LUNGS EVERY 6 HOURS AS NEEDED FOR WHEEZING OR SHORTNESS OF Cherly Beach, MD Taking Active   PROCTO-MED [facility] 2.5 % rectal cream [id number] Yes place RECTALLY TWICE DAILY Emily Koch, MD Taking Active   senna-docusate (SENNA-S) 8.6-50 MG tablet [id number] Yes Take 1 tablet by mouth daily. Emily Koch, MD Taking Active   silver sulfADIAZINE (SILVADENE) 1 % cream [id number] Yes Apply 1 application topically daily. Emily Koch, MD Taking Active   tamoxifen (NOLVADEX) 20 MG tablet [id number] No Take 1 tablet (20 mg total) by mouth daily.  Patient not taking: Reported on 04/13/2021   Magrinat, Virgie Dad, MD Not  Taking Active            Med Note Rolan Bucco Apr 13, 2021 11:30 AM)    tiZANidine (ZANAFLEX) 4 MG tablet [id number] Yes Take 4 mg by mouth as needed. [provider] Taking Active           Patient Active Problem List   Diagnosis Date Noted  . Dysuria 11/22/2020  . Hyperglycemia 07/18/2020  . Tobacco abuse 09/30/2019  . Constipation due to opioid therapy 09/30/2019  . Breast cancer screening, high risk patient 09/24/2019  . At high risk for breast cancer 09/24/2019  . 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 Anxiety state 05/06/2017  . Iron deficiency anemia 12/16/2015  . Hemorrhoids 06/14/2015  . Trigger index finger of right hand 05/17/2015  . Encounter for general adult medical examination with abnormal findings 05/12/2015  . Bilateral carpal tunnel syndrome 03/29/2015  . Left thyroid nodule   . Back pain, lumbosacral   . Depression with anxiety 03/22/2010  . MIGRAINE HEADACHE 03/22/2010  . Essential hypertension 03/22/2010  . Allergic rhinitis 03/22/2010  . Asthma 03/22/2010  . GERD 03/22/2010  . ARTHRITIS, SHOULDER 03/22/2010  . Major depressive disorder, single episode 03/22/2010    Immunization History  Administered Date(s) Administered  . Influenza,inj,Quad PF,6+ Mos 11/04/2013, 12/16/2015, 08/22/2016, 01/01/2018, 07/29/2018, 08/10/2019, 07/18/2020  . Moderna Sars-Covid-2 Vaccination 02/04/2020, 03/04/2020, 02/03/2021  . Td 03/22/2010  . Tdap 07/18/2020    Conditions to be addressed/monitored:  Hypertension, Asthma, Depression, Anxiety, Tobacco use and Chronic pain, Breast cancer  Patient Care Plan: CCM Pharmacy Care Plan    Problem Identified: Hypertension, Asthma, Depression, Anxiety, Tobacco use and Chronic pain, Breast cancer   Priority: High    Long-Range Goal: Disease management  Start Date: 03/17/2021  Expected End Date: 09/16/2021  This Visit's Progress: On track  Recent Progress: On track   Priority: High  Note:   Current Barriers:  . Unable to independently monitor therapeutic efficacy . Unable to achieve control of smoking cessation  . Patient is worried about side effects  Pharmacist Clinical Goal(s):  Marland Kitchen Patient will achieve adherence to monitoring guidelines and medication adherence to achieve therapeutic efficacy . adhere to plan to optimize therapeutic regimen for smoking cessation as evidenced by report of adherence to recommended medication management changes through collaboration with PharmD and provider.  . Discuss side effect concerns with providers  Interventions: . 1:1 collaboration with Emily Koch, MD regarding development and update of comprehensive plan of care as evidenced by provider attestation and co-signature . Inter-disciplinary care team collaboration (see longitudinal plan of care) . Comprehensive medication review performed; medication list updated in electronic medical record  Hypertension (BP goal <130/80) -Controlled w/o medications. Pt does endorse more frequent palpitations/"heart racing" since stopping diltiazem but these seem to be related to anxiety/[name] -Medications previously tried: losartan, diltiazem,  -Current home readings: SBP 120-140; HR 70-90 -Denies hypotensive/hypertensive symptoms -Educated on BP goals and benefits of medications for prevention of heart attack, stroke and kidney damage; Daily salt intake goal < 2300 mg; Importance of home blood pressure monitoring; -Counseled to monitor BP at home daily, document, and provide log at future appointments  Asthma (Goal: control symptoms and prevent exacerbations) -Controlled - pt reports breathing/SOB is controlled currently; she is using Flovent daily during allergy season; she does not always use it during other periods of the year; she does endorse palpitations sometimes after using albuterol nebulizer -Current treatment  . Fluticasone (Flovent) 110 mcg/act 2 puffs  BID . Albuterol HFA prn - 1 time a day . Albuterol 0.083% nebulizer - infrequent use.  . Montelukast 10 mg HS . Loratadine 10 mg daily  . Fluticasone nasal spray daily  -Patient reports consistent use of maintenance inhaler -Frequency of rescue inhaler use: once a day during allergy season, infrequent otherwise -Counseled that frequent use of albuterol can lead to palpitations as well; advised her to reserve nebulizer for flares -Recommended to continue current medication  Depression/Anxiety (Goal: manage symptoms) -Not ideally controlled - pt reports prayer and meditation has helped significantly. She had several deaths in her family over the last [name] months, and many living family do not get along which causes more stress; pt is interested in counseling -Current treatment: . Escitalopram 20 mg daily . Buspirone 7.5 mg BID -Medications previously tried/failed: sertraline -PHQ9: 0 (12/2020) -GAD7: not on file -Connected with PCP for mental health support -Educated on physical symptoms of anxiety; based on patient's descriptions of when palpitations and "shakes" occur, most likely explanation is anxiety/stress and not medications -Recommend to continue current medication and continue meditation/prayer -Recommend meeting with LCSW as part of her CCM services - message sent  Tobacco use (Goal: cessation) -Uncontrolled -pt smokes 1 ppd, 18 pack-year history -Previous quit attempts: pt has quit several times cold [place]; always restarted due to stressful life events or being around other people who smoke; she has tried NRT patches before and suffered vivid dreams/nightmares while wearing them overnight -Patient triggers include: anxiety and watching television and driving  -Pt agreed to try NRT with goal of complete smoking cessation -Counseled on patch placement, side effects, and option to remove at night if they experience trouble sleeping or bad dreams.  Counseled to allow lozenge to  dissolve and  absorb in cheek pocket, rather than swallow, to reduce GI side effects. -Given [place]-Quitline information and advised to call them for free nicotine patches/lozenges -Plan:  -start Nicotine patch 21 mg x 6 weeks, then 14 mg x 2 weeks, then 7 mg x 2 weeks -Start Nicotine lozenge 2 mg PRN for breakthrough cravings (up to every 1-2 hrs while awake)  Chronic pain (Goal: manage pain) -Controlled/improving - pt will be starting PT soon and overall pain is improving since back surgery -[name]. S/p back surgery 10/2020 -pt feels she has been able to cut back on oxycodone use since back surgery; she alternates between muscle relaxers and tries to avoid using 2 different ones in the same day -pt feels sluggish when getting up, low energy overall. Has been drinking V8 energy and Boost protein drinks to try to help.  -Current treatment  . Oxycodone 10 mg q6h PRN (#120/month) - pt takes anywhere from 2-4 tabs per day . Pregabalin 150 mg BID . Tizanidine 4 mg PRN . Cyclobenzaprine 10 mg PRN . Metaxalone PRN . Voltaren 1% gel . Naloxone 4 mg nasal spray PRN -Patient is satisfied with current regimen and denies issues -Counseled to avoid taking more medication than prescribed due to risk for oversedation and respiratory depression -Recommended to continue current medication  Breast cancer (Goal: prevent recurrence) -Not ideally controlled - pt has not started tamoxifen yet due to the fact that she is still smoking; she was told smoking increases the risk for blood clots with tamoxifen; her oncologist recently strongly advised her to start tamoxifen since benefits outweigh risks given her high risk for recurrent cancer -[name]. Dx Stage 1A dx 2020. S/p lumpectomy 09/2019 and s/p partial mastectomy 07/2020. S/p XRT Jan-Feb 2022. -Current treatment  . Tamoxifen 20 mg daily - not taking -Counseled on benefits of tamoxifen for preventing  recurrent breast cancer -counseled on potential side effects of tamoxifen - hot flashes may occur, if these are tolerable no intervention is needed; if they are severe advised pt to contact oncologist as therapy may need to be changed to aromatase inhibitor; of note, pt is already taking an SSRI and a gabapentinoid, which are the treatments for hot flashes -Plan: Start tamoxifen 20 mg daily; contact oncologist if severe hot flashes occur  Health Maintenance -Vaccine gaps: pneumococcal (PCV20 or PCV15 followed by PPSV23 1 year later) -Recommended patient set up appt for Prevnar 20, if available. If not PCV13 or PCV15 followed by PPSCV23 in 1 year is recommended.   Patient Goals/Self-Care Activities . Patient will:  - take medications as prescribed focus on medication adherence by pill box check blood pressure daily, document, and provide at future appointments  -Call 1-800-QUIT-NOW ([place] Quitline) for smoking cessation counseling and to get free nicotine patches/lozenges -Start Nicotine Patch 21 mg/hr - apply in AM and remove before bed -Use Nicotine lozenges for breakthrough cravings -Start Tamoxifen 20 mg daily; contact oncologist if severe hot flashes occur -Await call from social worker for counseling -Set up appointment for pneumonia vaccine     Medication Assistance: None required.  Patient affirms current coverage meets needs.  Patient's preferred pharmacy is:  [facility] - [place], [place] - [address] [place] [address] Phone: [phone] Fax: [phone]  Uses pill box? Yes Pt endorses 100% compliance  We discussed: Reviewed patient's [facility] medication and Epic medication profile assuring there are no discrepancies or gaps  in therapy. Confirmed all fill dates appropriate and verified with patient that there is a sufficient quantity of all prescribed medications at home. Informed patient to call me any time if  needing medications before scheduled deliveries.  Patient decided to: Utilize [facility] for medication synchronization, packaging and delivery  Care Plan and Follow Up Patient Decision:  Patient agrees to Care Plan and Follow-up.  Plan: Telephone follow up appointment with care management team member scheduled for:  1 month  Charlene Brooke, PharmD, [place], CPP Clinical Pharmacist [place] [facility] [phone]

## 2021-04-13 NOTE — Patient Instructions (Signed)
Visit Information  Phone number for Pharmacist: [phone]  Goals Addressed            This Visit's Progress   . Manage My Medicine       Timeframe:  Long-Range Goal Priority:  High Start Date:     03/17/21                        Expected End Date: 06/17/21                      Follow Up Date 04/17/21   - call for medicine refill 2 or 3 days before it runs out - call if I am sick and can't take my medicine - keep a list of all the medicines I take; vitamins and herbals too  -Utilize [facility] for medication synchronization, packaging and delivery -Start Tamoxifen 20 mg daily; contact oncologist if severe hot flashes occur -Await call from social worker for counseling -Set up appointment for pneumonia vaccine    Why is this important?   . These steps will help you keep on track with your medicines.   Notes:     . Stop or Cut Down Tobacco Use   Not on track    Timeframe:  Long-Range Goal Priority:  High Start Date:       03/17/21                      Expected End Date:    06/17/21                   Follow Up Date 04/17/21   - change or avoid triggers like smoky places, drinking alcohol and other smokers - cut down number of cigarettes by one-half - use Quit [employer] Now  -start Nicotine patch 21 mg x 6 weeks, then 14 mg x 2 weeks, then 7 mg x 2 weeks -Start Nicotine lozenge 2 mg PRN for breakthrough cravings (up to every 1-2 hrs while awake)   Why is this important?    To stop or cut down it is important to have support from a person or group of people who you can count on.   You will also need to think about the things that make you feel like smoking, then plan for how to handle them.    Notes:       Patient verbalizes understanding of instructions provided today and agrees to view in [place].  Telephone follow up appointment with pharmacy team member scheduled for: 1 month  Charlene Brooke, PharmD, [place], CPP Clinical Pharmacist [place]  [facility] at [facility] [phone]

## 2021-05-02 DIAGNOSIS — I972 Postmastectomy lymphedema syndrome: Secondary | ICD-10-CM | POA: Diagnosis not present

## 2021-05-02 DIAGNOSIS — C50911 Malignant neoplasm of unspecified site of right female breast: Secondary | ICD-10-CM | POA: Diagnosis not present

## 2021-05-04 DIAGNOSIS — M5030 Other cervical disc degeneration, unspecified cervical region: Secondary | ICD-10-CM | POA: Diagnosis not present

## 2021-05-04 DIAGNOSIS — G894 Chronic pain syndrome: Secondary | ICD-10-CM | POA: Diagnosis not present

## 2021-05-04 DIAGNOSIS — M130 Polyarthritis, unspecified: Secondary | ICD-10-CM | POA: Diagnosis not present

## 2021-05-04 DIAGNOSIS — Z981 Arthrodesis status: Secondary | ICD-10-CM | POA: Diagnosis not present

## 2021-05-04 DIAGNOSIS — M5136 Other intervertebral disc degeneration, lumbar region: Secondary | ICD-10-CM | POA: Diagnosis not present

## 2021-05-04 DIAGNOSIS — M47816 Spondylosis without myelopathy or radiculopathy, lumbar region: Secondary | ICD-10-CM | POA: Diagnosis not present

## 2021-05-04 DIAGNOSIS — M5412 Radiculopathy, cervical region: Secondary | ICD-10-CM | POA: Diagnosis not present

## 2021-05-04 DIAGNOSIS — G8929 Other chronic pain: Secondary | ICD-10-CM | POA: Diagnosis not present

## 2021-05-05 ENCOUNTER — Other Ambulatory Visit: Payer: Self-pay | Admitting: Internal Medicine

## 2021-05-05 DIAGNOSIS — J4520 Mild intermittent asthma, uncomplicated: Secondary | ICD-10-CM

## 2021-05-08 ENCOUNTER — Other Ambulatory Visit: Payer: Self-pay

## 2021-05-08 ENCOUNTER — Ambulatory Visit (INDEPENDENT_AMBULATORY_CARE_PROVIDER_SITE_OTHER): Payer: Medicare Other | Admitting: Pharmacist

## 2021-05-08 DIAGNOSIS — J4520 Mild intermittent asthma, uncomplicated: Secondary | ICD-10-CM | POA: Diagnosis not present

## 2021-05-08 DIAGNOSIS — J301 Allergic rhinitis due to pollen: Secondary | ICD-10-CM

## 2021-05-08 DIAGNOSIS — I10 Essential (primary) hypertension: Secondary | ICD-10-CM | POA: Diagnosis not present

## 2021-05-08 DIAGNOSIS — F418 Other specified anxiety disorders: Secondary | ICD-10-CM | POA: Diagnosis not present

## 2021-05-08 DIAGNOSIS — K5903 Drug induced constipation: Secondary | ICD-10-CM

## 2021-05-08 DIAGNOSIS — T402X5A Adverse effect of other opioids, initial encounter: Secondary | ICD-10-CM

## 2021-05-08 MED ORDER — SENNOSIDES-DOCUSATE SODIUM 8.6-50 MG PO TABS: 1.0000 | ORAL_TABLET | Freq: Every day | ORAL | 3 refills | Status: DC

## 2021-05-08 MED ORDER — FLUTICASONE PROPIONATE HFA 110 MCG/ACT IN AERO: INHALATION_SPRAY | RESPIRATORY_TRACT | 1 refills | Status: DC

## 2021-05-08 MED ORDER — PROAIR HFA 108 (90 BASE) MCG/ACT IN AERS: 1.0000 | INHALATION_SPRAY | Freq: Four times a day (QID) | RESPIRATORY_TRACT | 5 refills | Status: DC | PRN

## 2021-05-08 MED ORDER — MONTELUKAST SODIUM 10 MG PO TABS: ORAL_TABLET | ORAL | 0 refills | Status: DC

## 2021-05-08 MED ORDER — ALBUTEROL SULFATE (2.5 MG/3ML) 0.083% IN NEBU: 2.5000 mg | INHALATION_SOLUTION | RESPIRATORY_TRACT | 1 refills | Status: DC | PRN
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
## 2021-05-09 DIAGNOSIS — M79604 Pain in right leg: Secondary | ICD-10-CM | POA: Diagnosis not present

## 2021-05-09 DIAGNOSIS — M5459 Other low back pain: Secondary | ICD-10-CM | POA: Diagnosis not present

## 2021-05-11 NOTE — Patient Instructions (Signed)
Visit Information  Phone number for Pharmacist: [phone]   Goals Addressed             This Visit's Progress    Manage My Medicine       Timeframe:  Long-Range Goal Priority:  High Start Date:     03/17/21                        Expected End Date: 06/17/21                      Follow Up Date Sept 2022   - call for medicine refill 2 or 3 days before it runs out - call if I am sick and can't take my medicine - keep a list of all the medicines I take; vitamins and herbals too  -Utilize [facility] for medication synchronization, packaging and delivery -Continue Tamoxifen 20 mg daily; contact oncologist if severe hot flashes occur -Await call from social worker for counseling -Set up appointment for pneumonia vaccine    Why is this important?   These steps will help you keep on track with your medicines.   Notes:       Stop or Cut Down Tobacco Use       Timeframe:  Long-Range Goal Priority:  High Start Date:       03/17/21                      Expected End Date:    06/17/21                   Follow Up Date Sept 2022   - change or avoid triggers like smoky places, drinking alcohol and other smokers - cut down number of cigarettes by one-half - use Quit [employer] Now  -start Nicotine patch 21 mg x 6 weeks, then 14 mg x 2 weeks, then 7 mg x 2 weeks -Start Nicotine lozenge 2 mg PRN for breakthrough cravings (up to every 1-2 hrs while awake)   Why is this important?   To stop or cut down it is important to have support from a person or group of people who you can count on.  You will also need to think about the things that make you feel like smoking, then plan for how to handle them.    Notes:          Patient verbalizes understanding of instructions provided today and agrees to view in [place].  Telephone follow up appointment with pharmacy team member scheduled for: 3 months  Charlene Brooke, PharmD, [place], CPP Clinical Pharmacist [facility] [phone]

## 2021-05-15 ENCOUNTER — Telehealth: Payer: Medicare Other

## 2021-05-15 ENCOUNTER — Ambulatory Visit: Payer: Medicare Other | Admitting: *Deleted

## 2021-05-15 DIAGNOSIS — M79604 Pain in right leg: Secondary | ICD-10-CM | POA: Diagnosis not present

## 2021-05-15 DIAGNOSIS — M5459 Other low back pain: Secondary | ICD-10-CM | POA: Diagnosis not present

## 2021-05-15 DIAGNOSIS — M545 Low back pain, unspecified: Secondary | ICD-10-CM

## 2021-05-15 DIAGNOSIS — F418 Other specified anxiety disorders: Secondary | ICD-10-CM

## 2021-05-16 DIAGNOSIS — I972 Postmastectomy lymphedema syndrome: Secondary | ICD-10-CM | POA: Diagnosis not present

## 2021-05-16 DIAGNOSIS — C50911 Malignant neoplasm of unspecified site of right female breast: Secondary | ICD-10-CM | POA: Diagnosis not present

## 2021-05-16 DIAGNOSIS — M4326 Fusion of spine, lumbar region: Secondary | ICD-10-CM | POA: Diagnosis not present

## 2021-05-16 DIAGNOSIS — M961 Postlaminectomy syndrome, not elsewhere classified: Secondary | ICD-10-CM | POA: Diagnosis not present

## 2021-05-17 DIAGNOSIS — M79604 Pain in right leg: Secondary | ICD-10-CM | POA: Diagnosis not present

## 2021-05-17 DIAGNOSIS — M5459 Other low back pain: Secondary | ICD-10-CM | POA: Diagnosis not present

## 2021-05-17 NOTE — Patient Instructions (Signed)
Visit Information  PATIENT GOALS:  Goals Addressed               This Visit's Progress     Begin and Stick with Counseling-Depression (pt-stated)        Timeframe:  Short-Term Goal Priority:  High Start Date:     05/16/21                         Expected End Date:    06/18/21                   Follow Up Date 05/18/21    -expect phone outreach from [place] for linking you with an Barrister's clerk - keep 90 percent of counseling appointments - schedule counseling appointment    Why is this important?   Beating depression may take some time.  If you don't feel better right away, don't give up on your treatment plan.    Notes:          The patient verbalized understanding of instructions, educational materials, and care plan provided today and declined offer to receive copy of patient instructions, educational materials, and care plan.   Telephone follow up appointment with care management team member scheduled for:05/18/21 Eduard Clos MSW, LCSW Licensed Clinical Social Worker [place] [phone]

## 2021-05-17 NOTE — Chronic Care Management (AMB) (Signed)
Chronic Care Management    Clinical Social Work Note  05/17/2021 Name: Emily [name] MRN: [id number] DOB: 1971-11-01  Emily [name] is a 50 y.o. year old female who is a primary care patient of [name] Real Cons, MD. The CCM team was consulted to assist the patient with chronic disease management and/or care coordination needs related to: Mental Health Counseling and Resources.   Engaged with patient by telephone for initial visit in response to provider referral for social work chronic care management and care coordination services.   Consent to Services:  The patient was given information about Chronic Care Management services, agreed to services, and gave verbal consent prior to initiation of services.  Please see initial visit note for detailed documentation.   Patient agreed to services and consent obtained.   Assessment: Review of patient past medical history, allergies, medications, and health status, including review of relevant consultants reports was performed today as part of a comprehensive evaluation and provision of chronic care management and care coordination services.     SDOH (Social Determinants of Health) assessments and interventions performed:    Advanced Directives Status: Not addressed in this encounter.  CCM Care Plan  Allergies  Allergen Reactions   Other Hives    Dermabond   Morphine And Related     Lowers BP too much    Outpatient Encounter Medications as of 05/15/2021  Medication Sig   albuterol (PROVENTIL) (2.5 MG/3ML) 0.083% nebulizer solution Take 3 mLs (2.5 mg total) by nebulization every 4 (four) hours as needed for wheezing or shortness of breath.   busPIRone (BUSPAR) 7.5 MG tablet Take 1 tablet (7.5 mg total) by mouth 2 (two) times daily.   cyclobenzaprine (FLEXERIL) 10 MG tablet Take 10 mg by mouth daily as needed for muscle spasms.   diclofenac sodium (VOLTAREN) 1 % GEL Apply 2 g topically 3 (three) times daily as needed  (for pain).   escitalopram (LEXAPRO) 20 MG tablet Take 1 tablet (20 mg total) by mouth daily.   famotidine (PEPCID) 20 MG tablet Take 1 tablet (20 mg total) by mouth at bedtime.   fluticasone (FLONASE) 50 MCG/ACT nasal spray Place 2 sprays into both nostrils daily.   fluticasone (FLOVENT HFA) 110 MCG/ACT inhaler TAKE 2 PUFFS BY MOUTH TWICE A DAY   loratadine (CLARITIN) 10 MG tablet Take 1 tablet (10 mg total) by mouth daily.   metaxalone (SKELAXIN) 800 MG tablet Take 800 mg by mouth daily as needed for muscle spasms.   montelukast (SINGULAIR) 10 MG tablet TAKE 1 TABLET BY MOUTH EVERYDAY AT BEDTIME   nicotine (NICOTINE STEP 1) 21 mg/24hr patch Place 1 patch (21 mg total) onto the skin daily. Remove patch 30 min before bedtime   nicotine polacrilex (COMMIT) 2 MG lozenge Take 1 lozenge (2 mg total) by mouth as needed (every 1-2 hours for cravings).   Olopatadine HCl 0.2 % SOLN Apply 1-2 drops to eye 2 (two) times daily as needed (itching, running eyes).   Oxycodone HCl 10 MG TABS Take 10 mg by mouth every 6 (six) hours as needed.   polyethylene glycol powder (GLYCOLAX/MIRALAX) 17 GM/SCOOP powder Take 17 g by mouth 2 (two) times daily as needed.   pregabalin (LYRICA) 150 MG capsule Take 150 mg by mouth 2 (two) times daily.   PROAIR HFA 108 (90 Base) MCG/ACT inhaler Inhale 1-2 puffs into the lungs every 6 (six) hours as needed for wheezing or shortness of breath.   PROCTO-MED HC 2.5 % rectal cream  place RECTALLY TWICE DAILY   senna-docusate (SENNA-S) 8.6-50 MG tablet Take 1 tablet by mouth daily.   silver sulfADIAZINE (SILVADENE) 1 % cream Apply 1 application topically daily.   tamoxifen (NOLVADEX) 20 MG tablet Take 1 tablet (20 mg total) by mouth daily.   tiZANidine (ZANAFLEX) 4 MG tablet Take 4 mg by mouth as needed.   No facility-administered encounter medications on file as of 05/15/2021.    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 09/24/2019   Anxiety state 05/06/2017   Iron deficiency anemia 12/16/2015   Hemorrhoids 06/14/2015   Trigger index finger of right hand 05/17/2015   Encounter for general adult medical examination with abnormal findings 05/12/2015   Bilateral carpal tunnel syndrome 03/29/2015   Left thyroid nodule    Back pain, lumbosacral    Depression with anxiety 03/22/2010   MIGRAINE HEADACHE 03/22/2010   Essential hypertension 03/22/2010   Allergic rhinitis 03/22/2010   Asthma 03/22/2010   GERD 03/22/2010   ARTHRITIS, SHOULDER 03/22/2010   Major depressive disorder, single episode 03/22/2010    Conditions to be addressed/monitored: Depression; Mental Health Concerns   Care Plan : LCSW Plan of Care  Updates made by Deirdre Peer, LCSW since 05/17/2021 12:00 AM     Problem: Mental Health Needs- Depression   Priority: High     Goal: Develop Self Care Plan for Management of Depression   Start Date: 05/17/2021  Expected End Date: 06/18/2021  This Visit's Progress: On track  Priority: High  Note:   Current barriers:   Chronic Mental Health needs related to depression Mental Health Concerns  and Lacks knowledge of community resources Needs Support, Education, and Care Coordination in order to meet unmet mental health needs. Clinical Goal(s): patient will work with SW to address concerns related to depression/mental health support needs   Clinical Interventions:  CSW contacted pt to assess and provide support and services. Pt was en-route from an appointment and thus our conversation was limited and to be continued on 05/18/21. Pt did acknowledge depression and wanting to be linked with counselor. Pt denies SI/HI and further assessments to be completed 05/18/21.   Assessed patient's previous and current treatment, coping skills, support system and barriers to care  Review various resources, discussed options and provided patient information about Discussed several options for long term counseling based on need and insurance and [place] for counseling  interventions provided: Solution-Focused Strategies and Discussed referral to [facility] to assist with connecting to mental health provider ; Patient interviewed and appropriate assessments performed Referred patient to [place] (mental health provider) for long term follow up and therapy/counseling Discussed several options for long term counseling based on need and insurance.  Collaboration with PCP regarding development and update of comprehensive plan of care as evidenced by provider attestation and co-signature Inter-disciplinary care team collaboration (see longitudinal plan of care) Patient Goals/Self-Care Activities: Over the next 15 days - avoid negative self-talk - develop a personal safety plan - develop a plan to deal with triggers like holidays, anniversaries - have a plan for how to handle bad days - journal feelings and what helps to feel better or worse - spend time or talk with others at least 2  to 3 times per week - spend time or talk with others every day - watch for early signs of feeling worse - begin personal counseling - call and visit an old friend - join a support group - laugh; watch a funny movie or comedian - practice relaxation or meditation daily - start or continue a personal journal - practice positive thinking and self-talk I have placed a referral with [facility] to assist with connecting you with a mental health provider. they will contact you once a provider is located.        Follow Up Plan: Appointment scheduled for SW follow up with client by phone on: 05/18/21      Eduard Clos MSW, [place] Licensed Clinical Social Worker [place] [phone]

## 2021-05-18 ENCOUNTER — Ambulatory Visit: Payer: Medicare Other | Admitting: *Deleted

## 2021-05-18 DIAGNOSIS — J4520 Mild intermittent asthma, uncomplicated: Secondary | ICD-10-CM

## 2021-05-18 DIAGNOSIS — F418 Other specified anxiety disorders: Secondary | ICD-10-CM | POA: Diagnosis not present

## 2021-05-18 DIAGNOSIS — I10 Essential (primary) hypertension: Secondary | ICD-10-CM

## 2021-05-18 DIAGNOSIS — M545 Low back pain, unspecified: Secondary | ICD-10-CM

## 2021-05-18 NOTE — Patient Instructions (Signed)
Visit Information  PATIENT GOALS:  Goals Addressed   None     The patient verbalized understanding of instructions, educational materials, and care plan provided today and declined offer to receive copy of patient instructions, educational materials, and care plan.   Telephone follow up appointment with care management team member scheduled for:06/07/21  Eduard Clos MSW, LCSW Licensed Clinical Social Worker [place] [phone]

## 2021-05-18 NOTE — Chronic Care Management (AMB) (Signed)
Chronic Care Management    Clinical Social Work Note  05/18/2021 Name: Emily [name] MRN: [id number] DOB: June 16, 1971  Emily [name] is a 50 y.o. year old female who is a primary care patient of [name] Real Cons, MD. The CCM team was consulted to assist the patient with chronic disease management and/or care coordination needs related to: Mental Health Counseling and Resources.   Engaged with patient by telephone for initial visit in response to provider referral for social work chronic care management and care coordination services.   Consent to Services:  The patient was given information about Chronic Care Management services, agreed to services, and gave verbal consent prior to initiation of services.  Please see initial visit note for detailed documentation.   Patient agreed to services and consent obtained.   Assessment: Review of patient past medical history, allergies, medications, and health status, including review of relevant consultants reports was performed today as part of a comprehensive evaluation and provision of chronic care management and care coordination services.     SDOH (Social Determinants of Health) assessments and interventions performed:  SDOH Interventions    Flowsheet Row Most Recent Value  SDOH Interventions   Depression Interventions/Treatment  Referral to Psychiatry, Medication        Advanced Directives Status: Not addressed in this encounter.  CCM Care Plan  Allergies  Allergen Reactions   Other Hives    Dermabond   Morphine And Related     Lowers BP too much    Outpatient Encounter Medications as of 05/18/2021  Medication Sig   albuterol (PROVENTIL) (2.5 MG/3ML) 0.083% nebulizer solution Take 3 mLs (2.5 mg total) by nebulization every 4 (four) hours as needed for wheezing or shortness of breath.   busPIRone (BUSPAR) 7.5 MG tablet Take 1 tablet (7.5 mg total) by mouth 2 (two) times daily.   cyclobenzaprine (FLEXERIL)  10 MG tablet Take 10 mg by mouth daily as needed for muscle spasms.   diclofenac sodium (VOLTAREN) 1 % GEL Apply 2 g topically 3 (three) times daily as needed (for pain).   escitalopram (LEXAPRO) 20 MG tablet Take 1 tablet (20 mg total) by mouth daily.   famotidine (PEPCID) 20 MG tablet Take 1 tablet (20 mg total) by mouth at bedtime.   fluticasone (FLONASE) 50 MCG/ACT nasal spray Place 2 sprays into both nostrils daily.   fluticasone (FLOVENT HFA) 110 MCG/ACT inhaler TAKE 2 PUFFS BY MOUTH TWICE A DAY   loratadine (CLARITIN) 10 MG tablet Take 1 tablet (10 mg total) by mouth daily.   metaxalone (SKELAXIN) 800 MG tablet Take 800 mg by mouth daily as needed for muscle spasms.   montelukast (SINGULAIR) 10 MG tablet TAKE 1 TABLET BY MOUTH EVERYDAY AT BEDTIME   nicotine (NICOTINE STEP 1) 21 mg/24hr patch Place 1 patch (21 mg total) onto the skin daily. Remove patch 30 min before bedtime   nicotine polacrilex (COMMIT) 2 MG lozenge Take 1 lozenge (2 mg total) by mouth as needed (every 1-2 hours for cravings).   Olopatadine HCl 0.2 % SOLN Apply 1-2 drops to eye 2 (two) times daily as needed (itching, running eyes).   Oxycodone HCl 10 MG TABS Take 10 mg by mouth every 6 (six) hours as needed.   polyethylene glycol powder (GLYCOLAX/MIRALAX) 17 GM/SCOOP powder Take 17 g by mouth 2 (two) times daily as needed.   pregabalin (LYRICA) 150 MG capsule Take 150 mg by mouth 2 (two) times daily.   PROAIR HFA 108 (90 Base) MCG/ACT  inhaler Inhale 1-2 puffs into the lungs every 6 (six) hours as needed for wheezing or shortness of breath.   PROCTO-MED HC 2.5 % rectal cream place RECTALLY TWICE DAILY   senna-docusate (SENNA-S) 8.6-50 MG tablet Take 1 tablet by mouth daily.   silver sulfADIAZINE (SILVADENE) 1 % cream Apply 1 application topically daily.   tamoxifen (NOLVADEX) 20 MG tablet Take 1 tablet (20 mg total) by mouth daily.   tiZANidine (ZANAFLEX) 4 MG tablet Take 4 mg by mouth as needed.   No  facility-administered encounter medications on file as of 05/18/2021.    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 09/24/2019   Anxiety state 05/06/2017   Iron deficiency anemia 12/16/2015   Hemorrhoids 06/14/2015   Trigger index finger of right hand 05/17/2015   Encounter for general adult medical examination with abnormal findings 05/12/2015   Bilateral carpal tunnel syndrome 03/29/2015   Left thyroid nodule    Back pain, lumbosacral    Depression with anxiety 03/22/2010   MIGRAINE HEADACHE 03/22/2010   Essential hypertension 03/22/2010   Allergic rhinitis 03/22/2010   Asthma 03/22/2010   GERD 03/22/2010   ARTHRITIS, SHOULDER 03/22/2010   Major depressive disorder, single episode 03/22/2010    Conditions to be addressed/monitored: Anxiety and Depression; Mental Health Concerns   Care Plan : LCSW Plan of Care  Updates made by Deirdre Peer, LCSW since 05/18/2021 12:00 AM     Problem: Mental Health Needs- Depression   Priority: High     Long-Range Goal: Develop Self Care Plan for Management of Depression   Start Date: 05/17/2021  Expected End Date: 07/18/2021  This Visit's Progress: On track  Recent Progress: On track  Priority: High  Note:   Current barriers:   Chronic Mental Health needs related to depression Mental Health Concerns  and Lacks knowledge of community resources Needs Support, Education, and Care Coordination in order to meet unmet mental health needs. Clinical Goal(s): patient will work with SW to address concerns related to depression/mental health support needs   Clinical Interventions:  CSW contacted pt to assess and provide support and services. Pt was en-route  from an appointment and thus our conversation was limited and to be continued on 05/18/21. Pt did acknowledge depression and wanting to be linked with counselor. Pt denies SI/HI.  PHQ-9 Depression Screening completed- pt scoring mild 6. Pt does share a significant amount of depression/apathy and anxiety. As well, she has a significant family history of depression with suicides by both parent's siblings. Pt spoke quite openly about her withdrawing, "tucking my feelings inside" and overall feeling of being too overwhelmed and busy to deal with any of it.  "My home is my haven and I am very private/guarded". She also shared a history of being diagnosed with "major depression" years ago; sometimes finding herself "exploding". She also shared that she has had some "facial twitches when I get upset" along with heart palpitations. Pt is currently on a RX plan and would like to incorporate Counseling. CSW commended pt for her awareness of her needs and for speaking up.  Pt is also agreeable to a Psychiatric assessment to help in identifying most current/accurate diagnoses and best treatment plan (RX, etc).  Assessed patient's previous and current treatment, coping skills, support system and barriers to care  Review various resources, discussed options and provided patient information about Discussed several options for long term counseling based on need and insurance and [place] for counseling  interventions provided: Solution-Focused Strategies and Discussed referral to [facility] to assist with connecting to mental health provider ; Patient interviewed and appropriate assessments performed Referred patient to [place] (mental health provider) for long term follow up and therapy/counseling Discussed several options for long term counseling based on need and insurance.  Collaboration with PCP regarding development and update of comprehensive plan of care as evidenced by provider attestation and  co-signature Inter-disciplinary care team collaboration (see longitudinal plan of care) Patient Goals/Self-Care Activities: Over the next 15 days  - avoid negative self-talk -expect outreach from [place] to assist in coordination of Mental health appointments -call 911 or Crisis Line if worsening feelings surface - develop a personal safety plan - develop a plan to deal with triggers like holidays, anniversaries - have a plan for how to handle bad days - journal feelings and what helps to feel better or worse - spend time or talk with others at least 2 to 3 times per week - spend time or talk with others every day - watch for early signs of feeling worse - begin personal counseling - call and visit an old friend - join a support group - laugh; watch a funny movie or comedian - practice relaxation or meditation daily - start or continue a personal journal - practice positive thinking and self-talk I have placed a referral with [facility] to assist with connecting you with a mental health provider. they will contact you once a provider is located.        Follow Up Plan: Appointment scheduled for SW follow up with client by phone on: 06/07/21   and Client will follow up with [facility] at time of their outreach to arrange Psychiatry and Counseling      Eduard Clos MSW, [place] Licensed Clinical Social Worker [place] [phone]

## 2021-05-31 ENCOUNTER — Telehealth: Payer: Self-pay | Admitting: Pharmacist

## 2021-05-31 NOTE — Progress Notes (Signed)
Chronic Care Management Pharmacy Assistant   Name: Emily [name]  MRN: [id number] DOB: [birth year]   Reason for Encounter: Medication Review    Recent office visits:  None ID  Recent consult visits:  None ID  Hospital visits:  None in previous 6 months  Medications: Outpatient Encounter Medications as of 05/31/2021  Medication Sig   albuterol (PROVENTIL) (2.5 MG/3ML) 0.083% nebulizer solution Take 3 mLs (2.5 mg total) by nebulization every 4 (four) hours as needed for wheezing or shortness of breath.   busPIRone (BUSPAR) 7.5 MG tablet Take 1 tablet (7.5 mg total) by mouth 2 (two) times daily.   cyclobenzaprine (FLEXERIL) 10 MG tablet Take 10 mg by mouth daily as needed for muscle spasms.   diclofenac sodium (VOLTAREN) 1 % GEL Apply 2 g topically 3 (three) times daily as needed (for pain).   escitalopram (LEXAPRO) 20 MG tablet Take 1 tablet (20 mg total) by mouth daily.   famotidine (PEPCID) 20 MG tablet Take 1 tablet (20 mg total) by mouth at bedtime.   fluticasone (FLONASE) 50 MCG/ACT nasal spray Place 2 sprays into both nostrils daily.   fluticasone (FLOVENT HFA) 110 MCG/ACT inhaler TAKE 2 PUFFS BY MOUTH TWICE A DAY   loratadine (CLARITIN) 10 MG tablet Take 1 tablet (10 mg total) by mouth daily.   metaxalone (SKELAXIN) 800 MG tablet Take 800 mg by mouth daily as needed for muscle spasms.   montelukast (SINGULAIR) 10 MG tablet TAKE 1 TABLET BY MOUTH EVERYDAY AT BEDTIME   nicotine (NICOTINE STEP 1) 21 mg/24hr patch Place 1 patch (21 mg total) onto the skin daily. Remove patch 30 min before bedtime   nicotine polacrilex (COMMIT) 2 MG lozenge Take 1 lozenge (2 mg total) by mouth as needed (every 1-2 hours for cravings).   Olopatadine HCl 0.2 % SOLN Apply 1-2 drops to eye 2 (two) times daily as needed (itching, running eyes).   Oxycodone HCl 10 MG TABS Take 10 mg by mouth every 6 (six) hours as needed.   polyethylene glycol powder (GLYCOLAX/MIRALAX) 17 GM/SCOOP powder  Take 17 g by mouth 2 (two) times daily as needed.   pregabalin (LYRICA) 150 MG capsule Take 150 mg by mouth 2 (two) times daily.   PROAIR HFA 108 (90 Base) MCG/ACT inhaler Inhale 1-2 puffs into the lungs every 6 (six) hours as needed for wheezing or shortness of breath.   PROCTO-MED HC 2.5 % rectal cream place RECTALLY TWICE DAILY   senna-docusate (SENNA-S) 8.6-50 MG tablet Take 1 tablet by mouth daily.   silver sulfADIAZINE (SILVADENE) 1 % cream Apply 1 application topically daily.   tamoxifen (NOLVADEX) 20 MG tablet Take 1 tablet (20 mg total) by mouth daily.   tiZANidine (ZANAFLEX) 4 MG tablet Take 4 mg by mouth as needed.   No facility-administered encounter medications on file as of 05/31/2021.    Pharmacist Review  Reviewed chart for medication changes ahead of medication coordination call.  No OVs, Consults, or hospital visits since last care coordination call/Pharmacist visit. (If appropriate, list visit date, provider name)  No medication changes indicated OR if recent visit, treatment plan here.  BP Readings from Last 3 Encounters:  01/16/21 122/70  01/16/21 122/70  08/17/20 116/76    Lab Results  Component Value Date   HGBA1C 5.5 07/18/2020     Patient obtains medications through Vials  90 Days   Last adherence delivery included:  Loratadine 10 mg 1 tab daily 04/05/21 Senna Plus 8.6-50 mg 1 tab  daily 05/09/21 Famotidine 20 mg 1 tab at bedtime 03/20/21 Buspirone Hcl 7.5 mg 1 tab twice daily 03/20/21 Albuterol sul 2.5 mg/74ml  take every 4 hours as needed 05/09/21 Proair Hfa Aer inhale 1-2 puffs as needed 05/09/21   Patient is due for next adherence delivery on: 06/14/21.  Called patient and reviewed medications and coordinated delivery. This delivery to include: Loratadine 10 mg 1 tab daily 04/05/21 Senna Plus 8.6-50 mg 1 tab daily 05/09/21 Famotidine 20 mg 1 tab at bedtime 03/20/21 Buspirone Hcl 7.5 mg 1 tab twice daily 03/20/21 Albuterol sul 2.5 mg/84ml  take every 4 hours  as needed 05/09/21 Proair Hfa Aer inhale 1-2 puffs as needed 05/09/21 Tamoxifen 20 mg 1 tab daily Flonase 50 mcg/act 2 sprays both nostrils daily Montelukast 10 mg 1 tab at bedtime   Patient needs refills for none ID  Confirmed delivery date of 06/14/21, advised patient that pharmacy will contact them the morning of delivery.   [place] Pharmacist Assistant [phone]   Time spent:40

## 2021-06-06 DIAGNOSIS — M79604 Pain in right leg: Secondary | ICD-10-CM | POA: Diagnosis not present

## 2021-06-06 DIAGNOSIS — M5459 Other low back pain: Secondary | ICD-10-CM | POA: Diagnosis not present

## 2021-06-07 ENCOUNTER — Other Ambulatory Visit: Payer: Self-pay | Admitting: *Deleted

## 2021-06-07 ENCOUNTER — Ambulatory Visit (INDEPENDENT_AMBULATORY_CARE_PROVIDER_SITE_OTHER): Payer: Medicare Other | Admitting: *Deleted

## 2021-06-07 DIAGNOSIS — F418 Other specified anxiety disorders: Secondary | ICD-10-CM | POA: Diagnosis not present

## 2021-06-07 DIAGNOSIS — M545 Low back pain, unspecified: Secondary | ICD-10-CM

## 2021-06-07 MED ORDER — TAMOXIFEN CITRATE 20 MG PO TABS: 20.0000 mg | ORAL_TABLET | Freq: Every day | ORAL | 4 refills | Status: AC

## 2021-06-07 NOTE — Patient Instructions (Signed)
Visit Information  PATIENT GOALS:  Goals Addressed               This Visit's Progress     Begin and Stick with Counseling-Depression (pt-stated)        Timeframe:  Short-Term Goal Priority:  High Start Date:     05/16/21                         Expected End Date:    07/19/21                   Follow Up Date 06/21/21    -expect phone outreach from [place] for linking you with an Barrister's clerk - keep 90 percent of counseling appointments - schedule counseling appointment    Why is this important?   Beating depression may take some time.  If you don't feel better right away, don't give up on your treatment plan.    Notes:         The patient verbalized understanding of instructions, educational materials, and care plan provided today and declined offer to receive copy of patient instructions, educational materials, and care plan.   Telephone follow up appointment with care management team member scheduled for:  Eduard Clos MSW, LCSW Licensed Clinical Social Worker [place] [phone]

## 2021-06-07 NOTE — Chronic Care Management (AMB) (Cosign Needed Addendum)
Chronic Care Management    Clinical Social Work Note  06/07/2021 Name: Emily [name] MRN: [id number] DOB: Apr 23, 1971  Emily [name] is a 50 y.o. year old female who is a primary care patient of [name] Real Cons, MD. The CCM team was consulted to assist the patient with chronic disease management and/or care coordination needs related to: Mental Health Counseling and Resources and Grief Counseling.   Engaged with patient by telephone for follow up visit in response to provider referral for social work chronic care management and care coordination services.   Consent to Services:  The patient was given information about Chronic Care Management services, agreed to services, and gave verbal consent prior to initiation of services.  Please see initial visit note for detailed documentation.   Patient agreed to services and consent obtained.   Assessment: Review of patient past medical history, allergies, medications, and health status, including review of relevant consultants reports was performed today as part of a comprehensive evaluation and provision of chronic care management and care coordination services.     SDOH (Social Determinants of Health) assessments and interventions performed:    Advanced Directives Status: Not addressed in this encounter.  CCM Care Plan  Allergies  Allergen Reactions   Other Hives    Dermabond   Morphine And Related     Lowers BP too much    Outpatient Encounter Medications as of 06/07/2021  Medication Sig   albuterol (PROVENTIL) (2.5 MG/3ML) 0.083% nebulizer solution Take 3 mLs (2.5 mg total) by nebulization every 4 (four) hours as needed for wheezing or shortness of breath.   busPIRone (BUSPAR) 7.5 MG tablet Take 1 tablet (7.5 mg total) by mouth 2 (two) times daily.   cyclobenzaprine (FLEXERIL) 10 MG tablet Take 10 mg by mouth daily as needed for muscle spasms.   diclofenac sodium (VOLTAREN) 1 % GEL Apply 2 g topically 3  (three) times daily as needed (for pain).   escitalopram (LEXAPRO) 20 MG tablet Take 1 tablet (20 mg total) by mouth daily.   famotidine (PEPCID) 20 MG tablet Take 1 tablet (20 mg total) by mouth at bedtime.   fluticasone (FLONASE) 50 MCG/ACT nasal spray Place 2 sprays into both nostrils daily.   fluticasone (FLOVENT HFA) 110 MCG/ACT inhaler TAKE 2 PUFFS BY MOUTH TWICE A DAY   loratadine (CLARITIN) 10 MG tablet Take 1 tablet (10 mg total) by mouth daily.   metaxalone (SKELAXIN) 800 MG tablet Take 800 mg by mouth daily as needed for muscle spasms.   montelukast (SINGULAIR) 10 MG tablet TAKE 1 TABLET BY MOUTH EVERYDAY AT BEDTIME   nicotine (NICOTINE STEP 1) 21 mg/24hr patch Place 1 patch (21 mg total) onto the skin daily. Remove patch 30 min before bedtime   nicotine polacrilex (COMMIT) 2 MG lozenge Take 1 lozenge (2 mg total) by mouth as needed (every 1-2 hours for cravings).   Olopatadine HCl 0.2 % SOLN Apply 1-2 drops to eye 2 (two) times daily as needed (itching, running eyes).   Oxycodone HCl 10 MG TABS Take 10 mg by mouth every 6 (six) hours as needed.   polyethylene glycol powder (GLYCOLAX/MIRALAX) 17 GM/SCOOP powder Take 17 g by mouth 2 (two) times daily as needed.   pregabalin (LYRICA) 150 MG capsule Take 150 mg by mouth 2 (two) times daily.   PROAIR HFA 108 (90 Base) MCG/ACT inhaler Inhale 1-2 puffs into the lungs every 6 (six) hours as needed for wheezing or shortness of breath.   PROCTO-MED [facility]  2.5 % rectal cream place RECTALLY TWICE DAILY   senna-docusate (SENNA-S) 8.6-50 MG tablet Take 1 tablet by mouth daily.   silver sulfADIAZINE (SILVADENE) 1 % cream Apply 1 application topically daily.   tamoxifen (NOLVADEX) 20 MG tablet Take 1 tablet (20 mg total) by mouth daily.   tiZANidine (ZANAFLEX) 4 MG tablet Take 4 mg by mouth as needed.   No facility-administered encounter medications on file as of 06/07/2021.    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 09/24/2019   Anxiety state 05/06/2017   Iron deficiency anemia 12/16/2015   Hemorrhoids 06/14/2015   Trigger index finger of right hand 05/17/2015   Encounter for general adult medical examination with abnormal findings 05/12/2015   Bilateral carpal tunnel syndrome 03/29/2015   Left thyroid nodule    Back pain, lumbosacral    Depression with anxiety 03/22/2010   MIGRAINE HEADACHE 03/22/2010   Essential hypertension 03/22/2010   Allergic rhinitis 03/22/2010   Asthma 03/22/2010   GERD 03/22/2010   ARTHRITIS, SHOULDER 03/22/2010   Major depressive disorder, single episode 03/22/2010    Conditions to be addressed/monitored: Anxiety and Depression; Mental Health Concerns   Care Plan : LCSW Plan of Care  Updates made by Deirdre Peer, LCSW since 06/07/2021 12:00 AM     Problem: Mental Health Needs- Depression   Priority: High     Long-Range Goal: Develop Self Care Plan for Management of Depression   Start Date: 05/17/2021  Expected End Date: 07/18/2021  This Visit's Progress: On track  Recent Progress: On track  Priority: High  Note:   Current barriers:   Chronic Mental Health needs related to depression Mental Health Concerns  and Lacks knowledge of community resources Needs Support, Education, and Care Coordination in order to meet unmet mental health needs. Clinical Goal(s): patient will work with SW to address concerns related to depression/mental health support needs   Clinical Interventions:  CSW spoke with pt who reports she has not heard back from [place] regarding counseling- said she had some phone issues- CSW will update [facility] and ask that they reach out to her again- pt is still interested in counseling  support- wants to start with virtual due to gas prices and lots of appointments.  Pt reports taking her meds as prescribed- she reports some "grogginess"- suggested she make PCP aware at next visit.   Pt also shared ways she is coping and working towards positive self talk and activities. She enjoys her pets (ferrett, dog and working on an aquarium).  She shared she has "made some peace" with things; recent family deaths, loss of her 48yo dog, etc. CSW validated her feelings and commended her for the positive steps being made.   Assessed patient's previous and current treatment, coping skills, support system and barriers to care  Review various resources, discussed options and provided patient information about Discussed several options for long term counseling based on need and insurance and [place] for counseling  interventions provided: Solution-Focused Strategies and Discussed referral to [facility] to assist with connecting to mental health provider ; Patient interviewed and appropriate assessments performed Referred patient to [facility] (mental health provider) for long term follow up and therapy/counseling Discussed several options  for long term counseling based on need and insurance.  Collaboration with PCP regarding development and update of comprehensive plan of care as evidenced by provider attestation and co-signature Inter-disciplinary care team collaboration (see longitudinal plan of care) Patient Goals/Self-Care Activities: Over the next 20 days  - avoid negative self-talk -expect outreach from [facility] to assist in coordination of Mental health appointments -call 911 or Crisis Line if worsening feelings surface - develop a personal safety plan - develop a plan to deal with triggers like holidays, anniversaries - have a plan for how to handle bad days - journal feelings and what helps to feel better or worse - spend time or talk with others at least 2 to 3 times per week -  spend time or talk with others every day - watch for early signs of feeling worse - begin personal counseling - call and visit an old friend - join a support group - laugh; watch a funny movie or comedian - practice relaxation or meditation daily - start or continue a personal journal - practice positive thinking and self-talk I have placed a referral with [facility] to assist with connecting you with a mental health provider. they will contact you once a provider is located.        Follow Up Plan: SW will follow up with patient by phone over the next 3 weeks and Client will contact CSW if no call received from [place] re; counseling  appointment      Eduard Clos MSW, LCSW Licensed Clinical Social Worker [place] [phone]

## 2021-06-08 DIAGNOSIS — I890 Lymphedema, not elsewhere classified: Secondary | ICD-10-CM | POA: Diagnosis not present

## 2021-06-08 DIAGNOSIS — M79604 Pain in right leg: Secondary | ICD-10-CM | POA: Diagnosis not present

## 2021-06-08 DIAGNOSIS — M5459 Other low back pain: Secondary | ICD-10-CM | POA: Diagnosis not present

## 2021-06-14 ENCOUNTER — Other Ambulatory Visit: Payer: Self-pay | Admitting: Internal Medicine

## 2021-06-14 DIAGNOSIS — K219 Gastro-esophageal reflux disease without esophagitis: Secondary | ICD-10-CM

## 2021-06-14 DIAGNOSIS — J301 Allergic rhinitis due to pollen: Secondary | ICD-10-CM

## 2021-06-14 DIAGNOSIS — F418 Other specified anxiety disorders: Secondary | ICD-10-CM

## 2021-06-15 DIAGNOSIS — I972 Postmastectomy lymphedema syndrome: Secondary | ICD-10-CM | POA: Diagnosis not present

## 2021-06-15 DIAGNOSIS — D0511 Intraductal carcinoma in situ of right breast: Secondary | ICD-10-CM | POA: Diagnosis not present

## 2021-06-15 DIAGNOSIS — M79621 Pain in right upper arm: Secondary | ICD-10-CM | POA: Diagnosis not present

## 2021-06-15 DIAGNOSIS — R600 Localized edema: Secondary | ICD-10-CM | POA: Diagnosis not present

## 2021-06-15 DIAGNOSIS — Z9011 Acquired absence of right breast and nipple: Secondary | ICD-10-CM | POA: Diagnosis not present

## 2021-06-16 DIAGNOSIS — M5459 Other low back pain: Secondary | ICD-10-CM | POA: Diagnosis not present

## 2021-06-16 DIAGNOSIS — M79604 Pain in right leg: Secondary | ICD-10-CM | POA: Diagnosis not present

## 2021-06-16 NOTE — Progress Notes (Signed)
    Chronic Care Management Pharmacy Assistant   Name: Emily [name]  MRN: [id number] DOB: 07-Oct-1971   Reason for Encounter: Chart Review   Pharmacist Review Reviewed chart for medication changes and adherence.  No OVs, Consults, or hospital visits since last care coordination call / Pharmacist visit. No medication changes indicated  No gaps in adherence identified. Patient has follow up scheduled with pharmacy team. No further action required.   [place] Pharmacist Assistant [phone]   Time spent:8

## 2021-06-20 DIAGNOSIS — M79604 Pain in right leg: Secondary | ICD-10-CM | POA: Diagnosis not present

## 2021-06-20 DIAGNOSIS — M5459 Other low back pain: Secondary | ICD-10-CM | POA: Diagnosis not present

## 2021-06-21 ENCOUNTER — Ambulatory Visit (INDEPENDENT_AMBULATORY_CARE_PROVIDER_SITE_OTHER): Payer: Medicare Other | Admitting: *Deleted

## 2021-06-21 DIAGNOSIS — F418 Other specified anxiety disorders: Secondary | ICD-10-CM

## 2021-06-21 DIAGNOSIS — M545 Low back pain, unspecified: Secondary | ICD-10-CM

## 2021-06-21 DIAGNOSIS — I10 Essential (primary) hypertension: Secondary | ICD-10-CM

## 2021-06-21 NOTE — Chronic Care Management (AMB) (Signed)
Chronic Care Management    Clinical Social Work Note  06/21/2021 Name: Emily [name] MRN: [id number] DOB: 03/07/1971  Emily [name] is a 50 y.o. year old female who is a primary care patient of Emily Salina Real Cons, MD. The CCM team was consulted to assist the patient with chronic disease management and/or care coordination needs related to: Mental Health Counseling and Resources.   Engaged with patient by telephone for follow up visit in response to provider referral for social work chronic care management and care coordination services.   Consent to Services:  The patient was given information about Chronic Care Management services, agreed to services, and gave verbal consent prior to initiation of services.  Please see initial visit note for detailed documentation.   Patient agreed to services and consent obtained.   Assessment: Review of patient past medical history, allergies, medications, and health status, including review of relevant consultants reports was performed today as part of a comprehensive evaluation and provision of chronic care management and care coordination services.     SDOH (Social Determinants of Health) assessments and interventions performed:  SDOH Interventions    Flowsheet Row Most Recent Value  SDOH Interventions   Depression Interventions/Treatment  Counseling, Medication        Advanced Directives Status: Not addressed in this encounter.  CCM Care Plan  Allergies  Allergen Reactions   Other Hives    Dermabond   Morphine And Related     Lowers BP too much    Outpatient Encounter Medications as of 06/21/2021  Medication Sig   albuterol (PROVENTIL) (2.5 MG/3ML) 0.083% nebulizer solution Take 3 mLs (2.5 mg total) by nebulization every 4 (four) hours as needed for wheezing or shortness of breath.   ALLERGY RELIEF 10 MG tablet TAKE ONE TABLET BY MOUTH EVERYDAY AT BEDTIME   busPIRone (BUSPAR) 7.5 MG tablet TAKE ONE TABLET BY MOUTH  EVERY MORNING and TAKE ONE TABLET BY MOUTH EVERYDAY AT BEDTIME   cyclobenzaprine (FLEXERIL) 10 MG tablet Take 10 mg by mouth daily as needed for muscle spasms.   diclofenac sodium (VOLTAREN) 1 % GEL Apply 2 g topically 3 (three) times daily as needed (for pain).   escitalopram (LEXAPRO) 20 MG tablet Take 1 tablet (20 mg total) by mouth daily.   famotidine (PEPCID) 20 MG tablet TAKE ONE TABLET BY MOUTH EVERYDAY AT BEDTIME   fluticasone (FLONASE) 50 MCG/ACT nasal spray Place 2 sprays into both nostrils daily.   fluticasone (FLOVENT HFA) 110 MCG/ACT inhaler TAKE 2 PUFFS BY MOUTH TWICE A DAY   metaxalone (SKELAXIN) 800 MG tablet Take 800 mg by mouth daily as needed for muscle spasms.   montelukast (SINGULAIR) 10 MG tablet TAKE 1 TABLET BY MOUTH EVERYDAY AT BEDTIME   nicotine (NICOTINE STEP 1) 21 mg/24hr patch Place 1 patch (21 mg total) onto the skin daily. Remove patch 30 min before bedtime   nicotine polacrilex (COMMIT) 2 MG lozenge Take 1 lozenge (2 mg total) by mouth as needed (every 1-2 hours for cravings).   Olopatadine HCl 0.2 % SOLN Apply 1-2 drops to eye 2 (two) times daily as needed (itching, running eyes).   Oxycodone HCl 10 MG TABS Take 10 mg by mouth every 6 (six) hours as needed.   polyethylene glycol powder (GLYCOLAX/MIRALAX) 17 GM/SCOOP powder Take 17 g by mouth 2 (two) times daily as needed.   pregabalin (LYRICA) 150 MG capsule Take 150 mg by mouth 2 (two) times daily.   PROAIR HFA 108 (90 Base) MCG/ACT  inhaler Inhale 1-2 puffs into the lungs every 6 (six) hours as needed for wheezing or shortness of breath.   PROCTO-MED HC 2.5 % rectal cream place RECTALLY TWICE DAILY   senna-docusate (SENNA-S) 8.6-50 MG tablet Take 1 tablet by mouth daily.   silver sulfADIAZINE (SILVADENE) 1 % cream Apply 1 application topically daily.   tamoxifen (NOLVADEX) 20 MG tablet Take 1 tablet (20 mg total) by mouth daily.   tiZANidine (ZANAFLEX) 4 MG tablet Take 4 mg by mouth as needed.   No  facility-administered encounter medications on file as of 06/21/2021.    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 09/24/2019   Anxiety state 05/06/2017   Iron deficiency anemia 12/16/2015   Hemorrhoids 06/14/2015   Trigger index finger of right hand 05/17/2015   Encounter for general adult medical examination with abnormal findings 05/12/2015   Bilateral carpal tunnel syndrome 03/29/2015   Left thyroid nodule    Back pain, lumbosacral    Depression with anxiety 03/22/2010   MIGRAINE HEADACHE 03/22/2010   Essential hypertension 03/22/2010   Allergic rhinitis 03/22/2010   Asthma 03/22/2010   GERD 03/22/2010   ARTHRITIS, SHOULDER 03/22/2010   Major depressive disorder, single episode 03/22/2010    Conditions to be addressed/monitored: Anxiety and Depression; Mental Health Concerns   Care Plan : LCSW Plan of Care  Updates made by Emily Peer, LCSW since 06/21/2021 12:00 AM     Problem: Mental Health Needs- Depression   Priority: High     Long-Range Goal: Develop Self Care Plan for Management of Depression   Start Date: 05/17/2021  Expected End Date: 07/18/2021  Recent Progress: On track  Priority: High  Note:   Current barriers:   Chronic Mental Health needs related to depression Mental Health Concerns  and Lacks knowledge of community resources Needs Support, Education, and Care Coordination in order to meet unmet mental health needs. Clinical Goal(s): patient will work with SW to address concerns related to depression/mental health support needs   Clinical Interventions:  CSW spoke with pt who reports she still has not heard back from [place] regarding counseling- CSW advised her of  repeat referral made and per their report they could not reach her 06/16/21. Pt said continued to have some phone issues- CSW will update [facility] and ask that they reach out to her again- pt is still interested in counseling support- wants to start with virtual due to gas prices and lots of appointments.  CSW completed the PHQ9 Depression Screening with pt today and found her to score 9 (higher than a month ago when she scored 6).  Pt admits to having lots going on with family members sick with Covid, feeling overwhelmed, worried about her fiance who needs back surgery but insurance issues, etc. CSW offered supportive listening and encouragement; as well info on [facility] which may be of help to both pt and her fiance.  "I would like to get back to working".    Assessed patient's previous and current treatment, coping skills, support system and barriers to care  Review various resources, discussed options and provided patient information about Discussed several options for long term counseling based on need and insurance and [facility] for  counseling  interventions provided: Solution-Focused Strategies and Discussed referral to [facility] to assist with connecting to mental health provider ; Patient interviewed and appropriate assessments performed Referred patient to [facility] (mental health provider) for long term follow up and therapy/counseling Discussed several options for long term counseling based on need and insurance.  Collaboration with PCP regarding development and update of comprehensive plan of care as evidenced by provider attestation and co-signature Inter-disciplinary care team collaboration (see longitudinal plan of care) Patient Goals/Self-Care Activities: Over the next 30 days  - avoid negative self-talk -expect outreach from [place] to assist in coordination of Mental health appointments -call 911 or Crisis Line if worsening feelings surface - develop a personal safety plan -  develop a plan to deal with triggers like holidays, anniversaries - have a plan for how to handle bad days - journal feelings and what helps to feel better or worse - spend time or talk with others at least 2 to 3 times per week - spend time or talk with others every day - watch for early signs of feeling worse - begin personal counseling - call and visit an old friend - join a support group - laugh; watch a funny movie or comedian - practice relaxation or meditation daily - start or continue a personal journal - practice positive thinking and self-talk I have placed a referral with [facility] to assist with connecting you with a mental health provider. they will contact you once a provider is located.        Follow Up Plan: Appointment scheduled for SW follow up with client by phone on: 07/14/21      Eduard Clos MSW, [place] Licensed Clinical Social Worker [place] [phone]

## 2021-06-21 NOTE — Patient Instructions (Signed)
Visit Information  PATIENT GOALS:  Goals Addressed   None     The patient verbalized understanding of instructions, educational materials, and care plan provided today and declined offer to receive copy of patient instructions, educational materials, and care plan.   Telephone follow up appointment with care management team member scheduled for:07/14/21  Eduard Clos MSW, [place] Licensed Clinical Social Worker [place] [phone]

## 2021-06-22 DIAGNOSIS — M79604 Pain in right leg: Secondary | ICD-10-CM | POA: Diagnosis not present

## 2021-06-22 DIAGNOSIS — M5459 Other low back pain: Secondary | ICD-10-CM | POA: Diagnosis not present

## 2021-06-27 ENCOUNTER — Other Ambulatory Visit: Payer: Self-pay | Admitting: Internal Medicine

## 2021-06-27 DIAGNOSIS — J4520 Mild intermittent asthma, uncomplicated: Secondary | ICD-10-CM

## 2021-07-03 ENCOUNTER — Other Ambulatory Visit: Payer: Self-pay | Admitting: Internal Medicine

## 2021-07-03 ENCOUNTER — Telehealth: Payer: Self-pay | Admitting: Pharmacist

## 2021-07-03 DIAGNOSIS — C50911 Malignant neoplasm of unspecified site of right female breast: Secondary | ICD-10-CM | POA: Diagnosis not present

## 2021-07-03 DIAGNOSIS — Z853 Personal history of malignant neoplasm of breast: Secondary | ICD-10-CM | POA: Diagnosis not present

## 2021-07-03 DIAGNOSIS — K649 Unspecified hemorrhoids: Secondary | ICD-10-CM

## 2021-07-03 NOTE — Progress Notes (Signed)
Chronic Care Management Pharmacy Assistant   Name: Landis Beamon MRN: [id number] DOB: [birth year]  Reason for Encounter: Medication Review - Coordination Call   Medications: Outpatient Encounter Medications as of 07/03/2021  Medication Sig   albuterol (PROVENTIL) (2.5 MG/3ML) 0.083% nebulizer solution Take 3 mLs (2.5 mg total) by nebulization every 4 (four) hours as needed for wheezing or shortness of breath.   ALLERGY RELIEF 10 MG tablet TAKE ONE TABLET BY MOUTH EVERYDAY AT BEDTIME   busPIRone (BUSPAR) 7.5 MG tablet TAKE ONE TABLET BY MOUTH EVERY MORNING and TAKE ONE TABLET BY MOUTH EVERYDAY AT BEDTIME   cyclobenzaprine (FLEXERIL) 10 MG tablet Take 10 mg by mouth daily as needed for muscle spasms.   diclofenac sodium (VOLTAREN) 1 % GEL Apply 2 g topically 3 (three) times daily as needed (for pain).   escitalopram (LEXAPRO) 20 MG tablet Take 1 tablet (20 mg total) by mouth daily.   famotidine (PEPCID) 20 MG tablet TAKE ONE TABLET BY MOUTH EVERYDAY AT BEDTIME   fluticasone (FLONASE) 50 MCG/ACT nasal spray Place 2 sprays into both nostrils daily.   fluticasone (FLOVENT HFA) 110 MCG/ACT inhaler TAKE 2 PUFFS BY MOUTH TWICE A DAY   metaxalone (SKELAXIN) 800 MG tablet Take 800 mg by mouth daily as needed for muscle spasms.   montelukast (SINGULAIR) 10 MG tablet TAKE 1 TABLET BY MOUTH EVERYDAY AT BEDTIME   nicotine (NICOTINE STEP 1) 21 mg/24hr patch Place 1 patch (21 mg total) onto the skin daily. Remove patch 30 min before bedtime   nicotine polacrilex (COMMIT) 2 MG lozenge Take 1 lozenge (2 mg total) by mouth as needed (every 1-2 hours for cravings).   Olopatadine HCl 0.2 % SOLN Apply 1-2 drops to eye 2 (two) times daily as needed (itching, running eyes).   Oxycodone HCl 10 MG TABS Take 10 mg by mouth every 6 (six) hours as needed.   polyethylene glycol powder (GLYCOLAX/MIRALAX) 17 GM/SCOOP powder Take 17 g by mouth 2 (two) times daily as needed.   pregabalin (LYRICA) 150 MG capsule  Take 150 mg by mouth 2 (two) times daily.   PROAIR HFA 108 (90 Base) MCG/ACT inhaler Inhale 1-2 puffs into the lungs every 6 (six) hours as needed for wheezing or shortness of breath.   PROCTO-MED [facility] 2.5 % rectal cream place RECTALLY TWICE DAILY   senna-docusate (SENNA-S) 8.6-50 MG tablet Take 1 tablet by mouth daily.   silver sulfADIAZINE (SILVADENE) 1 % cream Apply 1 application topically daily.   tamoxifen (NOLVADEX) 20 MG tablet Take 1 tablet (20 mg total) by mouth daily.   tiZANidine (ZANAFLEX) 4 MG tablet Take 4 mg by mouth as needed.   No facility-administered encounter medications on file as of 07/03/2021.   Reviewed chart for medication changes ahead of medication coordination call.  No OVs, Consults, or hospital visits since last care coordination call/Pharmacist visit. (If appropriate, list visit date, provider name)  No medication changes indicated OR if recent visit, treatment plan here.  BP Readings from Last 3 Encounters:  01/16/21 122/70  01/16/21 122/70  08/17/20 116/76    Lab Results  Component Value Date   HGBA1C 5.5 07/18/2020     Patient obtains medications through Vials  90 Days   Last adherence delivery included:  Loratadine 10 mg 1 tab daily 04/05/21 Senna Plus 8.6-50 mg 1 tab daily 05/09/21 Famotidine 20 mg 1 tab at bedtime 03/20/21 Buspirone Hcl 7.5 mg 1 tab twice daily 03/20/21 Albuterol sul 2.5 mg/34m  take every  4 hours as needed 05/09/21 Proair Hfa Aer inhale 1-2 puffs as needed 05/09/21 Tamoxifen 20 mg 1 tab daily Flonase 50 mcg/act 2 sprays both nostrils daily Montelukast 10 mg 1 tab at bedtime  Patient is due for next adherence delivery on: 07/12/21. Called patient and reviewed medications and coordinated delivery.  This delivery to include: PROAIR HFA 108 (90 Base) MCG/ACT inhaler PROCTO-MED HC 2.5 % rectal cream  Patient needs refills for PROCTO-MED HC 2.5 % rectal cream.  Confirmed delivery date of 07/12/21, advised patient that pharmacy will  contact them the morning of delivery.   Patient would like all meds to synced after this delivery starting in October 2022.  Star Rating Drugs: None listed  Orinda Kenner, RMA Clinical Pharmacists Assistant [phone]  Time Spent: [phone]

## 2021-07-06 DIAGNOSIS — M5412 Radiculopathy, cervical region: Secondary | ICD-10-CM | POA: Diagnosis not present

## 2021-07-06 DIAGNOSIS — M130 Polyarthritis, unspecified: Secondary | ICD-10-CM | POA: Diagnosis not present

## 2021-07-06 DIAGNOSIS — M47816 Spondylosis without myelopathy or radiculopathy, lumbar region: Secondary | ICD-10-CM | POA: Diagnosis not present

## 2021-07-06 DIAGNOSIS — M5030 Other cervical disc degeneration, unspecified cervical region: Secondary | ICD-10-CM | POA: Diagnosis not present

## 2021-07-06 DIAGNOSIS — G8929 Other chronic pain: Secondary | ICD-10-CM | POA: Diagnosis not present

## 2021-07-06 DIAGNOSIS — Z981 Arthrodesis status: Secondary | ICD-10-CM | POA: Diagnosis not present

## 2021-07-06 DIAGNOSIS — G894 Chronic pain syndrome: Secondary | ICD-10-CM | POA: Diagnosis not present

## 2021-07-06 DIAGNOSIS — M5136 Other intervertebral disc degeneration, lumbar region: Secondary | ICD-10-CM | POA: Diagnosis not present

## 2021-07-07 NOTE — Progress Notes (Signed)
Called [facility] and asked them to take Escitalopram off of auto refill, patient currently uses [facility] now. Patient was removed as of today.  Orinda Kenner, [place] Clinical Pharmacists Assistant [phone]  Time Spent:  [phone]

## 2021-07-11 DIAGNOSIS — M4326 Fusion of spine, lumbar region: Secondary | ICD-10-CM | POA: Diagnosis not present

## 2021-07-11 DIAGNOSIS — M7918 Myalgia, other site: Secondary | ICD-10-CM | POA: Diagnosis not present

## 2021-07-11 DIAGNOSIS — M48062 Spinal stenosis, lumbar region with neurogenic claudication: Secondary | ICD-10-CM | POA: Diagnosis not present

## 2021-07-13 ENCOUNTER — Ambulatory Visit: Payer: Medicare Other | Admitting: *Deleted

## 2021-07-13 DIAGNOSIS — F418 Other specified anxiety disorders: Secondary | ICD-10-CM

## 2021-07-13 DIAGNOSIS — M545 Low back pain, unspecified: Secondary | ICD-10-CM

## 2021-07-13 DIAGNOSIS — K219 Gastro-esophageal reflux disease without esophagitis: Secondary | ICD-10-CM

## 2021-07-13 NOTE — Patient Instructions (Signed)
Visit Information  PATIENT GOALS:  Goals Addressed               This Visit's Progress     Begin and Stick with Counseling-Depression (pt-stated)        Timeframe:  Short-Term Goal Priority:  High Start Date:     05/16/21                         Expected End Date:    09/31/22                   Follow Up Date 08/07/21     avoid negative self-talk -call [facility] to request assistance with in-network  Mental health appointments [phone] -call 911 or Crisis Line if worsening feelings surface - develop a personal safety plan - develop a plan to deal with triggers like holidays, anniversaries - have a plan for how to handle bad days - journal feelings and what helps to feel better or worse - spend time or talk with others at least 2 to 3 times per week - spend time or talk with others every day - watch for early signs of feeling worse - begin personal counseling - call and visit an old friend - join a support group - laugh; watch a funny movie or comedian - practice relaxation or meditation daily - start or continue a personal journal - practice positive thinking and self-talk I have placed a referral with [facility] to assist with connecting you with a mental health provider. they will contact you once a provider is located.     Why is this important?   Beating depression may take some time.  If you don't feel better right away, don't give up on your treatment plan.    Notes:         The patient verbalized understanding of instructions, educational materials, and care plan provided today and declined offer to receive copy of patient instructions, educational materials, and care plan.   Telephone follow up appointment with care management team member scheduled for:08/07/21  Eduard Clos MSW, [place] Licensed Clinical Social Worker [place] [phone]

## 2021-07-13 NOTE — Chronic Care Management (AMB) (Signed)
Chronic Care Management    Clinical Social Work Note  07/13/2021 Name: Emily [name] MRN: [id number] DOB: 11-Oct-1971  Emily [name] is a 50 y.o. year old female who is a primary care patient of Emily Salina Real Cons, MD. The CCM team was consulted to assist the patient with chronic disease management and/or care coordination needs related to: [employer]  and [place] and Resources.   Engaged with patient by telephone for follow up visit in response to provider referral for social work chronic care management and care coordination services.   Consent to Services:  The patient was given information about Chronic Care Management services, agreed to services, and gave verbal consent prior to initiation of services.  Please see initial visit note for detailed documentation.   Patient agreed to services and consent obtained.   Assessment: Review of patient past medical history, allergies, medications, and health status, including review of relevant consultants reports was performed today as part of a comprehensive evaluation and provision of chronic care management and care coordination services.     SDOH (Social Determinants of Health) assessments and interventions performed:    Advanced Directives Status: Not addressed in this encounter.  CCM Care Plan  Allergies  Allergen Reactions   Other Hives    Dermabond   Morphine And Related     Lowers BP too much    Outpatient Encounter Medications as of 07/13/2021  Medication Sig   albuterol (PROVENTIL) (2.5 MG/3ML) 0.083% nebulizer solution Take 3 mLs (2.5 mg total) by nebulization every 4 (four) hours as needed for wheezing or shortness of breath.   ALLERGY RELIEF 10 MG tablet TAKE ONE TABLET BY MOUTH EVERYDAY AT BEDTIME   busPIRone (BUSPAR) 7.5 MG tablet TAKE ONE TABLET BY MOUTH EVERY MORNING and TAKE ONE TABLET BY MOUTH EVERYDAY AT BEDTIME   cyclobenzaprine (FLEXERIL) 10 MG tablet Take 10 mg by  mouth daily as needed for muscle spasms.   diclofenac sodium (VOLTAREN) 1 % GEL Apply 2 g topically 3 (three) times daily as needed (for pain).   escitalopram (LEXAPRO) 20 MG tablet Take 1 tablet (20 mg total) by mouth daily.   famotidine (PEPCID) 20 MG tablet TAKE ONE TABLET BY MOUTH EVERYDAY AT BEDTIME   fluticasone (FLONASE) 50 MCG/ACT nasal spray Place 2 sprays into both nostrils daily.   fluticasone (FLOVENT HFA) 110 MCG/ACT inhaler TAKE 2 PUFFS BY MOUTH TWICE A DAY   metaxalone (SKELAXIN) 800 MG tablet Take 800 mg by mouth daily as needed for muscle spasms.   montelukast (SINGULAIR) 10 MG tablet TAKE 1 TABLET BY MOUTH EVERYDAY AT BEDTIME   nicotine (NICOTINE STEP 1) 21 mg/24hr patch Place 1 patch (21 mg total) onto the skin daily. Remove patch 30 min before bedtime   nicotine polacrilex (COMMIT) 2 MG lozenge Take 1 lozenge (2 mg total) by mouth as needed (every 1-2 hours for cravings).   Olopatadine HCl 0.2 % SOLN Apply 1-2 drops to eye 2 (two) times daily as needed (itching, running eyes).   Oxycodone HCl 10 MG TABS Take 10 mg by mouth every 6 (six) hours as needed.   polyethylene glycol powder (GLYCOLAX/MIRALAX) 17 GM/SCOOP powder Take 17 g by mouth 2 (two) times daily as needed.   pregabalin (LYRICA) 150 MG capsule Take 150 mg by mouth 2 (two) times daily.   PROAIR HFA 108 (90 Base) MCG/ACT inhaler Inhale 1-2 puffs into the lungs every 6 (six) hours as needed for wheezing or shortness of breath.  PROCTO-MED HC 2.5 % rectal cream place RECTALLY TWICE DAILY   senna-docusate (SENNA-S) 8.6-50 MG tablet Take 1 tablet by mouth daily.   silver sulfADIAZINE (SILVADENE) 1 % cream Apply 1 application topically daily.   tamoxifen (NOLVADEX) 20 MG tablet Take 1 tablet (20 mg total) by mouth daily.   tiZANidine (ZANAFLEX) 4 MG tablet Take 4 mg by mouth as needed.   No facility-administered encounter medications on file as of 07/13/2021.    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 09/24/2019   Anxiety state 05/06/2017   Iron deficiency anemia 12/16/2015   Hemorrhoids 06/14/2015   Trigger index finger of right hand 05/17/2015   Encounter for general adult medical examination with abnormal findings 05/12/2015   Bilateral carpal tunnel syndrome 03/29/2015   Left thyroid nodule    Back pain, lumbosacral    Depression with anxiety 03/22/2010   MIGRAINE HEADACHE 03/22/2010   Essential hypertension 03/22/2010   Allergic rhinitis 03/22/2010   Asthma 03/22/2010   GERD 03/22/2010   ARTHRITIS, SHOULDER 03/22/2010   Major depressive disorder, single episode 03/22/2010    Conditions to be addressed/monitored: Depression; Mental Health Concerns   Care Plan : LCSW Plan of Care  Updates made by Deirdre Peer, LCSW since 07/13/2021 12:00 AM     Problem: Mental Health Needs- Depression   Priority: High     Long-Range Goal: Develop Self Care Plan for Management of Depression   Start Date: 05/17/2021  Expected End Date: 07/18/2021  Recent Progress: On track  Priority: High  Note:   Current barriers:   Chronic Mental Health needs related to depression Mental Health Concerns  and Lacks knowledge of community resources Needs Support, Education, and Care Coordination in order to meet unmet mental health needs. Clinical Goal(s): patient will work with SW to address concerns related to depression/mental health support needs   Clinical Interventions:  CSW spoke with pt who reports she was told that the provider was not in-network for her insurance- CSW advised pt that per [facility]'s report she "deferred".  Pt states she is interested and will call them to pursue this again.  Reminded pt that with her 2  Insurance's she should have no copay if it is set up with in-network Psychiatrist and counseling-   CSW just earlier this month made a second referral. Pt also shared she has had some back pain resulting in a Cortisone shot and also sees her PCP next week.  CSW encouraged to follow up on the re-scheduling with [facility] [phone]) Assessed patient's previous and current treatment, coping skills, support system and barriers to care  Review various resources, discussed options and provided patient information about Discussed several options for long term counseling based on need and insurance and [facility] for counseling  interventions provided: Solution-Focused Strategies and Discussed referral to [facility] to assist with connecting to mental health provider ; Patient interviewed and appropriate assessments performed Referred patient to [facility] (mental health provider) for long term follow up and therapy/counseling Discussed several options for long term counseling based on need and insurance.  Collaboration with PCP regarding development and update of comprehensive plan of care as evidenced by provider attestation and co-signature Inter-disciplinary care team collaboration (see  longitudinal plan of care) Patient Goals/Self-Care Activities: Over the next 30 days  - avoid negative self-talk -call [facility] to request assistance with in-network  Mental health appointments -call 911 or Crisis Line if worsening feelings surface - develop a personal safety plan - develop a plan to deal with triggers like holidays, anniversaries - have a plan for how to handle bad days - journal feelings and what helps to feel better or worse - spend time or talk with others at least 2 to 3 times per week - spend time or talk with others every day - watch for early signs of feeling worse - begin personal counseling - call and visit an old friend - join a support group - laugh; watch a funny movie or  comedian - practice relaxation or meditation daily - start or continue a personal journal - practice positive thinking and self-talk       Follow Up Plan: Appointment scheduled for SW follow up with client by phone on: 08/07/21      Eduard Clos MSW, [place] Licensed Clinical Social Worker [place] [phone]

## 2021-07-14 ENCOUNTER — Telehealth: Payer: Medicare Other

## 2021-07-16 ENCOUNTER — Other Ambulatory Visit: Payer: Self-pay

## 2021-07-17 ENCOUNTER — Encounter: Payer: Self-pay | Admitting: Internal Medicine

## 2021-07-17 ENCOUNTER — Ambulatory Visit (INDEPENDENT_AMBULATORY_CARE_PROVIDER_SITE_OTHER): Payer: Medicare Other | Admitting: Internal Medicine

## 2021-07-17 VITALS — BP 108/80 | HR 81 | Temp 98.5°F | Resp 18 | Ht 59.0 in | Wt 177.0 lb

## 2021-07-17 DIAGNOSIS — Z23 Encounter for immunization: Secondary | ICD-10-CM | POA: Diagnosis not present

## 2021-07-17 DIAGNOSIS — M545 Low back pain, unspecified: Secondary | ICD-10-CM | POA: Diagnosis not present

## 2021-07-17 DIAGNOSIS — F418 Other specified anxiety disorders: Secondary | ICD-10-CM | POA: Diagnosis not present

## 2021-07-17 DIAGNOSIS — J410 Simple chronic bronchitis: Secondary | ICD-10-CM | POA: Diagnosis not present

## 2021-07-17 NOTE — Progress Notes (Signed)
   Subjective:   [name], female    DOB: 1971-06-29, 50 y.o.   MRN: [id number]  HPI The patient is a 50 YO female coming in for wanting to quit smoking and not sure how to do this. Wants advice or recommendations.   Review of Systems  Constitutional: Negative.   HENT: Negative.    Eyes: Negative.   Respiratory:  Positive for cough. Negative for chest tightness and shortness of breath.   Cardiovascular:  Negative for chest pain, palpitations and leg swelling.  Gastrointestinal:  Negative for abdominal distention, abdominal pain, constipation, diarrhea, nausea and vomiting.  Musculoskeletal:  Positive for arthralgias and back pain.  Skin: Negative.   Neurological: Negative.   Psychiatric/Behavioral: Negative.     Objective:  Physical Exam Constitutional:      Appearance: She is well-developed. She is obese.  HENT:     Head: Normocephalic and atraumatic.  Cardiovascular:     Rate and Rhythm: Normal rate and regular rhythm.  Pulmonary:     Effort: Pulmonary effort is normal. No respiratory distress.     Breath sounds: Normal breath sounds. No wheezing or rales.  Abdominal:     General: Bowel sounds are normal. There is no distension.     Palpations: Abdomen is soft.     Tenderness: There is no abdominal tenderness. There is no rebound.  Musculoskeletal:        General: Tenderness present.     Cervical back: Normal range of motion.  Skin:    General: Skin is warm and dry.  Neurological:     Mental Status: She is alert and oriented to person, place, and time.     Coordination: Coordination normal.    Vitals:   07/17/21 1033  BP: 108/80  Pulse: 81  Resp: 18  Temp: 98.5 F (36.9 C)  TempSrc: Oral  SpO2: 98%  Weight: 177 lb (80.3 kg)  Height: '4\' 11"'$  (1.499 m)   This visit occurred during the SARS-CoV-2 public health emergency.  Safety protocols were in place, including screening questions prior to the visit, additional usage of staff PPE, and  extensive cleaning of exam room while observing appropriate contact time as indicated for disinfecting solutions.   Assessment & Plan:  Prevnar 20 given at visit

## 2021-07-17 NOTE — Patient Instructions (Signed)
Work on quitting smoking.  Think about the financial impact and use that to help you stop smoking.

## 2021-07-19 NOTE — Assessment & Plan Note (Signed)
Still taking lexapro 20 mg daily and buspar 8.5 mg BID which is helping some. She is satisfied with current level of control. Did talk about smoking as a source of anxiety although it can feel like it helps anxiety can worsen over time. She is close to trying to quit smoking and encouraged this.

## 2021-07-19 NOTE — Assessment & Plan Note (Signed)
Counseled about quitting smoking and offered several options such as wellbutrin, nicotine replacement (she has ordered through [id number] and should be arriving soon), as well as behavior modification. Counseled about need to monitor food intake as smoking cessation can cause weight gain due to increased sense of smell and taste once respiratory tract recovers.

## 2021-07-19 NOTE — Assessment & Plan Note (Signed)
Still working with PT intermittently and s/p back surgery. Seeing pain management for her chronic opioid therapy.

## 2021-08-07 ENCOUNTER — Telehealth: Payer: Medicare Other

## 2021-08-08 ENCOUNTER — Telehealth: Payer: Self-pay | Admitting: *Deleted

## 2021-08-08 NOTE — Telephone Encounter (Signed)
  Care Management   Follow Up Note   08/08/2021 Name: Emily [name] MRN: [id number] DOB: 1971-03-17   Referred by: Hoyt Koch, MD Reason for referral : No chief complaint on file.   An unsuccessful telephone outreach was attempted today. The patient was referred to the case management team for assistance with care management and care coordination.   Follow Up Plan: Telephone follow up appointment with care management team member scheduled for: 08/22/21  Eduard Clos MSW, LCSW Licensed Clinical Social Worker [place] [phone]

## 2021-08-09 ENCOUNTER — Telehealth: Payer: Medicare Other

## 2021-08-16 NOTE — Progress Notes (Deleted)
Subjective:    [name], female    DOB: 12/12/1970, 50 y.o.   MRN: [id number]  This visit occurred during the SARS-CoV-2 public health emergency.  Safety protocols were in place, including screening questions prior to the visit, additional usage of staff PPE, and extensive cleaning of exam room while observing appropriate contact time as indicated for disinfecting solutions.    HPI The patient is here for an acute visit.  ? UTI:  Her symptoms started  *** days ago.  She states dysuria, urinary frequency, urinary urgency, hematuria, abdominal pain, back pain, nausea, fever.   Had bactrim in jan  Medications and allergies reviewed with patient and updated if appropriate.  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 09/24/2019   Anxiety state 05/06/2017   Iron deficiency anemia 12/16/2015   Hemorrhoids 06/14/2015   Trigger index finger of right hand 05/17/2015   Encounter for general adult medical examination with abnormal findings 05/12/2015   Bilateral carpal tunnel syndrome 03/29/2015   Left thyroid nodule    Back pain, lumbosacral    Depression with anxiety 03/22/2010   MIGRAINE HEADACHE 03/22/2010   Essential hypertension 03/22/2010   Allergic rhinitis 03/22/2010   Asthma 03/22/2010   GERD 03/22/2010   ARTHRITIS, SHOULDER 03/22/2010   Major depressive disorder, single episode 03/22/2010    Current Outpatient Medications on File Prior to Visit  Medication Sig Dispense Refill   albuterol (PROVENTIL) (2.5 MG/3ML) 0.083% nebulizer solution Take 3 mLs (2.5 mg total) by nebulization every 4 (four) hours as needed for wheezing or shortness of breath. 450 mL 1    ALLERGY RELIEF 10 MG tablet TAKE ONE TABLET BY MOUTH EVERYDAY AT BEDTIME 90 tablet 0   busPIRone (BUSPAR) 7.5 MG tablet TAKE ONE TABLET BY MOUTH EVERY MORNING and TAKE ONE TABLET BY MOUTH EVERYDAY AT BEDTIME 180 tablet 0   cyclobenzaprine (FLEXERIL) 10 MG tablet Take 10 mg by mouth daily as needed for muscle spasms.     diclofenac sodium (VOLTAREN) 1 % GEL Apply 2 g topically 3 (three) times daily as needed (for pain).     dicyclomine (BENTYL) 20 MG tablet Take 20 mg by mouth in the morning and at bedtime.     escitalopram (LEXAPRO) 20 MG tablet Take 1 tablet (20 mg total) by mouth daily. 90 tablet 1   famotidine (PEPCID) 20 MG tablet TAKE ONE TABLET BY MOUTH EVERYDAY AT BEDTIME 90 tablet 0   fluticasone (FLONASE) 50 MCG/ACT nasal spray Place 2 sprays into both nostrils daily. 48 g 1   fluticasone (FLOVENT HFA) 110 MCG/ACT inhaler TAKE 2 PUFFS BY MOUTH TWICE A DAY 36 each 1   metaxalone (SKELAXIN) 800 MG tablet Take 800 mg by mouth daily as needed for muscle spasms.     montelukast (SINGULAIR) 10 MG tablet TAKE 1 TABLET BY MOUTH EVERYDAY AT BEDTIME 90 tablet 0   nicotine (NICOTINE STEP 1) 21 mg/24hr patch Place 1 patch (21 mg total) onto the skin daily. Remove patch 30 min before bedtime 28 patch 0   nicotine polacrilex (COMMIT) 2 MG lozenge Take 1 lozenge (2 mg total) by mouth as needed (every 1-2 hours for cravings). 100 tablet 0  Olopatadine HCl 0.2 % SOLN Apply 1-2 drops to eye 2 (two) times daily as needed (itching, running eyes). 5 mL 2   Oxycodone HCl 10 MG TABS Take 10 mg by mouth every 6 (six) hours as needed.     polyethylene glycol powder (GLYCOLAX/MIRALAX) 17 GM/SCOOP powder Take 17 g by mouth 2 (two) times daily as needed. 3350 g 1   pregabalin (LYRICA) 150 MG capsule Take 150 mg by mouth 2 (two) times daily.     PROAIR HFA 108 (90 Base) MCG/ACT inhaler Inhale 1-2 puffs into the lungs every 6 (six) hours as needed for wheezing or shortness of breath. 8.5 each 5   PROCTO-MED HC 2.5  % rectal cream place RECTALLY TWICE DAILY 28 g 0   senna-docusate (SENNA-S) 8.6-50 MG tablet Take 1 tablet by mouth daily. 90 tablet 3   silver sulfADIAZINE (SILVADENE) 1 % cream Apply 1 application topically daily. 50 g 0   tamoxifen (NOLVADEX) 20 MG tablet Take 1 tablet (20 mg total) by mouth daily. 90 tablet 4   tiZANidine (ZANAFLEX) 4 MG tablet Take 4 mg by mouth as needed.     No current facility-administered medications on file prior to visit.    Past Medical History:  Diagnosis Date   Allergic rhinitis    Anal fissure    Anemia    Anxiety    Arthritis    Asthma    uses inhaler as needed   Chronic neck pain    pain mgmt in [place]   Depression    Gallbladder disease    GERD (gastroesophageal reflux disease)    Headache(784.0)    HTN (hypertension)    BP has been normal recently per pt and not taking meds   Hyperthyroidism    Obesity    PONV (postoperative nausea and vomiting)    Thyroid nodule    incidental on MRI neck 2014, s/p endo eval   Uterine fibroid     Past Surgical History:  Procedure Laterality Date   BREAST LUMPECTOMY WITH RADIOACTIVE SEED LOCALIZATION Right 09/23/2019   Procedure: RIGHT BREAST LUMPECTOMY WITH RADIOACTIVE SEED LOCALIZATION;  Surgeon: Erroll Luna, MD;  Location: [facility];  Service: General;  Laterality: Right;   CERVICAL SPINE SURGERY     2 disc removed   [place] Malformation  2003   [place] N/A 03/15/2014   Procedure: DILATATION & CURETTAGE/HYSTEROSCOPY WITH NOVASURE ABLATION;  Surgeon: Lovenia Kim, MD;  Location: [facility];  Service: Gynecology;  Laterality: N/A;   Laberal tear  2001   Right Shoulder   LAPAROSCOPIC TOTAL HYSTERECTOMY  02/03/2018   MOUTH SURGERY     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)   WISDOM TOOTH EXTRACTION  1992    Social History   Socioeconomic History   Marital status: Divorced    Spouse name: Not on file    Number of children: 2   Years of education: Not on file   Highest education level: Not on file  Occupational History   Occupation: disabled  Tobacco Use   Smoking status: Every Day    Packs/day: 1.00    Years: 18.00    Pack years: 18.00    Types: Cigarettes   Smokeless tobacco: Never   Tobacco comments:    starting NRT patches/lozenges 03/2021  Vaping Use   Vaping Use: Never used  Substance and Sexual Activity   Alcohol use: No    Alcohol/week:  0.0 standard drinks   Drug use: No   Sexual activity: Yes  Other Topics Concern   Not on file  Social History Narrative   Lives w/fiance and daughter.   On disability.   Social Determinants of Health   Financial Resource Strain: Low Risk    Difficulty of Paying Living Expenses: Not hard at all  Food Insecurity: No Food Insecurity   Worried About Charity fundraiser in the Last Year: Never true   [place] in the Last Year: Never true  Transportation Needs: No Transportation Needs   Lack of Transportation (Medical): No   Lack of Transportation (Non-Medical): No  Physical Activity: Inactive   Days of Exercise per Week: 0 days   Minutes of Exercise per Session: 0 min  Stress: No Stress Concern Present   Feeling of Stress : Not at all  Social Connections: Socially Integrated   Frequency of Communication with Friends and Family: More than three times a week   Frequency of Social Gatherings with Friends and Family: Once a week   Attends Religious Services: 1 to 4 times per year   Active Member of [employer] or Organizations: No   Attends Music therapist: 1 to 4 times per year   Marital Status: Living with partner    Family History  Problem Relation Age of Onset   Breast cancer Mother        over 5 when dx   Diverticulitis Mother    Arthritis Mother    Hyperlipidemia Mother    Hypertension Mother    Pancreatic cancer Paternal Grandmother    Breast cancer Paternal Aunt        x 2 pat aunts   Irritable  bowel syndrome Daughter    Hypotension Daughter    Alcohol abuse Son    Alcohol abuse Paternal Uncle    Alcohol abuse Maternal Uncle        x several   Colon cancer Other        maternal grandmothers sister    Review of Systems     Objective:  There were no vitals filed for this visit. BP Readings from Last 3 Encounters:  07/17/21 108/80  01/16/21 122/70  01/16/21 122/70   Wt Readings from Last 3 Encounters:  07/17/21 177 lb (80.3 kg)  01/16/21 180 lb (81.6 kg)  01/16/21 180 lb (81.6 kg)   There is no height or weight on file to calculate BMI.   Physical Exam    Constitutional:      General: She is not in acute distress.    Appearance: Normal appearance. She is not ill-appearing.  HENT:     Head: Normocephalic and atraumatic.  Abdominal:     General: There is no distension.     Palpations: Abdomen is soft.     Tenderness: There is no abdominal tenderness. There is no right CVA tenderness, left CVA tenderness, guarding or rebound.  Skin:    General: Skin is warm and dry.  Neurological:     Mental Status: She is alert.       Assessment & Plan:    See Problem List for Assessment and Plan of chronic medical problems.

## 2021-08-17 ENCOUNTER — Ambulatory Visit: Payer: Medicare Other | Admitting: Internal Medicine

## 2021-08-22 ENCOUNTER — Ambulatory Visit (INDEPENDENT_AMBULATORY_CARE_PROVIDER_SITE_OTHER): Payer: Medicare Other | Admitting: *Deleted

## 2021-08-22 DIAGNOSIS — F411 Generalized anxiety disorder: Secondary | ICD-10-CM

## 2021-08-22 DIAGNOSIS — F418 Other specified anxiety disorders: Secondary | ICD-10-CM

## 2021-08-22 DIAGNOSIS — J4520 Mild intermittent asthma, uncomplicated: Secondary | ICD-10-CM

## 2021-08-22 NOTE — Chronic Care Management (AMB) (Signed)
Chronic Care Management    Clinical Social Work Note  08/22/2021 Name: Emily [name] MRN: [id number] DOB: 1971-09-11  Emily [name] is a 50 y.o. year old female who is a primary care patient of [name] Real Cons, MD. The CCM team was consulted to assist the patient with chronic disease management and/or care coordination needs related to: [employer]  and [place] [facility] [employer].   Engaged with patient by telephone for follow up visit in response to provider referral for social work chronic care management and care coordination services.   Consent to Services:  The patient was given information about Chronic Care Management services, agreed to services, and gave verbal consent prior to initiation of services.  Please see initial visit note for detailed documentation.   Patient agreed to services and consent obtained.   Assessment: Review of patient past medical history, allergies, medications, and health status, including review of relevant consultants reports was performed today as part of a comprehensive evaluation and provision of chronic care management and care coordination services.     SDOH (Social Determinants of Health) assessments and interventions performed:    Advanced Directives Status: Not addressed in this encounter.  CCM Care Plan  Allergies  Allergen Reactions   Other Hives    Dermabond   Morphine And Related     Lowers BP too much    Outpatient Encounter Medications as of 08/22/2021  Medication Sig   albuterol (PROVENTIL) (2.5 MG/3ML) 0.083% nebulizer solution Take 3 mLs (2.5 mg total) by nebulization every 4 (four) hours as needed for wheezing or shortness of breath.   ALLERGY RELIEF 10 MG tablet TAKE ONE TABLET BY MOUTH EVERYDAY AT BEDTIME   busPIRone (BUSPAR) 7.5 MG tablet TAKE ONE TABLET BY MOUTH EVERY MORNING and TAKE ONE TABLET BY MOUTH EVERYDAY AT BEDTIME   cyclobenzaprine (FLEXERIL) 10 MG tablet Take 10 mg by  mouth daily as needed for muscle spasms.   diclofenac sodium (VOLTAREN) 1 % GEL Apply 2 g topically 3 (three) times daily as needed (for pain).   dicyclomine (BENTYL) 20 MG tablet Take 20 mg by mouth in the morning and at bedtime.   escitalopram (LEXAPRO) 20 MG tablet Take 1 tablet (20 mg total) by mouth daily.   famotidine (PEPCID) 20 MG tablet TAKE ONE TABLET BY MOUTH EVERYDAY AT BEDTIME   fluticasone (FLONASE) 50 MCG/ACT nasal spray Place 2 sprays into both nostrils daily.   fluticasone (FLOVENT HFA) 110 MCG/ACT inhaler TAKE 2 PUFFS BY MOUTH TWICE A DAY   metaxalone (SKELAXIN) 800 MG tablet Take 800 mg by mouth daily as needed for muscle spasms.   montelukast (SINGULAIR) 10 MG tablet TAKE 1 TABLET BY MOUTH EVERYDAY AT BEDTIME   nicotine (NICOTINE STEP 1) 21 mg/24hr patch Place 1 patch (21 mg total) onto the skin daily. Remove patch 30 min before bedtime   nicotine polacrilex (COMMIT) 2 MG lozenge Take 1 lozenge (2 mg total) by mouth as needed (every 1-2 hours for cravings).   Olopatadine HCl 0.2 % SOLN Apply 1-2 drops to eye 2 (two) times daily as needed (itching, running eyes).   Oxycodone HCl 10 MG TABS Take 10 mg by mouth every 6 (six) hours as needed.   polyethylene glycol powder (GLYCOLAX/MIRALAX) 17 GM/SCOOP powder Take 17 g by mouth 2 (two) times daily as needed.   pregabalin (LYRICA) 150 MG capsule Take 150 mg by mouth 2 (two) times daily.   PROAIR HFA 108 (90 Base) MCG/ACT inhaler Inhale 1-2  puffs into the lungs every 6 (six) hours as needed for wheezing or shortness of breath.   PROCTO-MED HC 2.5 % rectal cream place RECTALLY TWICE DAILY   senna-docusate (SENNA-S) 8.6-50 MG tablet Take 1 tablet by mouth daily.   silver sulfADIAZINE (SILVADENE) 1 % cream Apply 1 application topically daily.   tamoxifen (NOLVADEX) 20 MG tablet Take 1 tablet (20 mg total) by mouth daily.   tiZANidine (ZANAFLEX) 4 MG tablet Take 4 mg by mouth as needed.   No facility-administered encounter medications  on file as of 08/22/2021.    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 09/24/2019   Anxiety state 05/06/2017   Iron deficiency anemia 12/16/2015   Hemorrhoids 06/14/2015   Trigger index finger of right hand 05/17/2015   Encounter for general adult medical examination with abnormal findings 05/12/2015   Bilateral carpal tunnel syndrome 03/29/2015   Left thyroid nodule    Back pain, lumbosacral    Depression with anxiety 03/22/2010   MIGRAINE HEADACHE 03/22/2010   Essential hypertension 03/22/2010   Allergic rhinitis 03/22/2010   Asthma 03/22/2010   GERD 03/22/2010   ARTHRITIS, SHOULDER 03/22/2010   Major depressive disorder, single episode 03/22/2010    Conditions to be addressed/monitored: Depression; Mental Health Concerns   Care Plan : LCSW Plan of Care  Updates made by Deirdre Peer, LCSW since 08/22/2021 12:00 AM     Problem: Mental Health Needs- Depression   Priority: High     Long-Range Goal: Develop Self Care Plan for Management of Depression   Start Date: 05/17/2021  Expected End Date: 11/17/2021  This Visit's Progress: On track  Recent Progress: On track  Priority: High  Note:   Current barriers:   Chronic Mental Health needs related to depression Mental Health Concerns  and Lacks knowledge of community resources Needs Support, Education, and Care Coordination in order to meet unmet mental health needs. Clinical Goal(s): patient will work with SW to address concerns related to depression/mental health support needs   Clinical Interventions:  Pt is feeling overwhelmed with all her appointments and things to accomplish; "I can't add another thing right now- might take  me over the edge".  CSW reminded pt that part of the benefit of pursuing mental health counseling/support is to help her better manage her emotions and life; through learning coping skills, positive self- talk, etc.   Pt wants to wait.  Pt also shared she has been breathing better; has had decrease in her lymphadema but also having hot flashes, middle of the night potty needs and "too much"..... CSW validated her feelings of being over loaded.  Pt wants to reconsider counseling referral in 1 month.    Assessed patient's previous and current treatment, coping skills, support system and barriers to care  Review various resources, discussed options and provided patient information about Discussed several options for long term counseling based on need and insurance and [place] for counseling  interventions provided: Solution-Focused Strategies and Discussed referral to [facility] to assist with connecting to mental health provider ; Patient interviewed and appropriate assessments performed Referred patient to [facility] (mental health provider) for long term follow up and therapy/counseling Discussed several options for long term counseling  based on need and insurance.  Collaboration with PCP regarding development and update of comprehensive plan of care as evidenced by provider attestation and co-signature Inter-disciplinary care team collaboration (see longitudinal plan of care) Patient Goals/Self-Care Activities: Over the next 30 days  - avoid negative self-talk -call 911 or Crisis Line if worsening feelings surface - develop a personal safety plan - develop a plan to deal with triggers like holidays, anniversaries - have a plan for how to handle bad days - journal feelings and what helps to feel better or worse - spend time or talk with others at least 2 to 3 times per week - spend time or talk with others every day - watch for early signs of feeling worse - begin personal counseling -  call and visit an old friend - join a support group - laugh; watch a funny movie or comedian - practice relaxation or meditation daily - start or continue a personal journal - practice positive thinking and self-talk       Follow Up Plan: Appointment scheduled for SW follow up with client by phone on: 09/27/21      Eduard Clos MSW, [place] Licensed Clinical Social Worker [place] [phone]

## 2021-08-22 NOTE — Patient Instructions (Signed)
Visit Information  PATIENT GOALS:  Goals Addressed   None     The patient verbalized understanding of instructions, educational materials, and care plan provided today and declined offer to receive copy of patient instructions, educational materials, and care plan.   Telephone follow up appointment with care management team member scheduled for:09/27/21  Eduard Clos MSW, LCSW Licensed Clinical Social Worker [place] [phone]

## 2021-08-23 ENCOUNTER — Ambulatory Visit: Payer: Medicare Other | Admitting: Emergency Medicine

## 2021-08-24 ENCOUNTER — Other Ambulatory Visit: Payer: Self-pay

## 2021-08-24 ENCOUNTER — Ambulatory Visit (INDEPENDENT_AMBULATORY_CARE_PROVIDER_SITE_OTHER): Payer: Medicare Other | Admitting: Nurse Practitioner

## 2021-08-24 ENCOUNTER — Encounter: Payer: Self-pay | Admitting: Nurse Practitioner

## 2021-08-24 ENCOUNTER — Encounter: Payer: Self-pay | Admitting: Gastroenterology

## 2021-08-24 VITALS — BP 149/91 | HR 81 | Temp 98.9°F | Ht 59.0 in | Wt 187.8 lb

## 2021-08-24 DIAGNOSIS — N390 Urinary tract infection, site not specified: Secondary | ICD-10-CM

## 2021-08-24 DIAGNOSIS — M545 Low back pain, unspecified: Secondary | ICD-10-CM

## 2021-08-24 DIAGNOSIS — K589 Irritable bowel syndrome without diarrhea: Secondary | ICD-10-CM | POA: Diagnosis not present

## 2021-08-24 DIAGNOSIS — K219 Gastro-esophageal reflux disease without esophagitis: Secondary | ICD-10-CM

## 2021-08-24 DIAGNOSIS — N8110 Cystocele, unspecified: Secondary | ICD-10-CM

## 2021-08-24 LAB — POCT URINALYSIS DIPSTICK
Bilirubin, UA: NEGATIVE
Blood, UA: POSITIVE
Glucose, UA: NEGATIVE
Ketones, UA: POSITIVE
Nitrite, UA: NEGATIVE
Protein, UA: POSITIVE — AB
Spec Grav, UA: 1.025 (ref 1.010–1.025)
Urobilinogen, UA: NEGATIVE E.U./dL — AB
pH, UA: 6 (ref 5.0–8.0)

## 2021-08-24 NOTE — Progress Notes (Signed)
Subjective:  [name], female    DOB: [birth year]  Age: 50 y.o. MRN: [id number]  CC:  Chief Complaint  Patient presents with   Urinary Tract Infection      HPI  This patient arrives today for the above.  She tells me she is been experiencing urinary frequency and feeling of incomplete emptying of her bladder.  She also noticed a bulge in her vagina over the last 2 weeks.  The bulge seems to come and go and seem to be more prominent when she has to strain.  She is straining due to some pain with her back which she is actively being treated for and recently underwent surgery for.  She has not noticed any vaginal bleeding.  She has not had any worsening abdominal discomfort.  She has been able to void as well as passing gastrointestinal gas and having regular bowel movements.  She is also requesting referral to gastroenterologist which she needs for treatment of GERD and IBS.  Her previous gastroenterologist has either retired or left the area.  She also like a prescription for railings to be set up in her bathroom if possible.  Past Medical History:  Diagnosis Date   Allergic rhinitis    Anal fissure    Anemia    Anxiety    Arthritis    Asthma    uses inhaler as needed   Chronic neck pain    pain mgmt in [place]   Depression    Gallbladder disease    GERD (gastroesophageal reflux disease)    Headache(784.0)    HTN (hypertension)    BP has been normal recently per pt and not taking meds   Hyperthyroidism    Obesity    PONV (postoperative nausea and vomiting)    Thyroid nodule    incidental on MRI neck 2014, s/p endo eval   Uterine fibroid       Family History  Problem Relation Age of Onset   Breast cancer Mother        over 68 when dx   Diverticulitis Mother    Arthritis Mother    Hyperlipidemia Mother    Hypertension Mother    Pancreatic cancer Paternal Grandmother    Breast cancer Paternal Aunt        x 2 pat aunts   Irritable bowel  syndrome Daughter    Hypotension Daughter    Alcohol abuse Son    Alcohol abuse Paternal Uncle    Alcohol abuse Maternal Uncle        x several   Colon cancer Other        maternal grandmothers sister    Social History   Social History Narrative   Lives w/fiance and daughter.   On disability.   Social History   Tobacco Use   Smoking status: Every Day    Packs/day: 1.00    Years: 18.00    Pack years: 18.00    Types: Cigarettes   Smokeless tobacco: Never   Tobacco comments:    starting NRT patches/lozenges 03/2021  Substance Use Topics   Alcohol use: No    Alcohol/week: 0.0 standard drinks     Current Meds  Medication Sig   albuterol (PROVENTIL) (2.5 MG/3ML) 0.083% nebulizer solution Take 3 mLs (2.5 mg total) by nebulization every 4 (four) hours as needed for wheezing or shortness of breath.   ALLERGY RELIEF 10 MG tablet TAKE ONE TABLET BY MOUTH EVERYDAY AT BEDTIME  busPIRone (BUSPAR) 7.5 MG tablet TAKE ONE TABLET BY MOUTH EVERY MORNING and TAKE ONE TABLET BY MOUTH EVERYDAY AT BEDTIME   cyclobenzaprine (FLEXERIL) 10 MG tablet Take 10 mg by mouth daily as needed for muscle spasms.   diclofenac sodium (VOLTAREN) 1 % GEL Apply 2 g topically 3 (three) times daily as needed (for pain).   dicyclomine (BENTYL) 20 MG tablet Take 20 mg by mouth in the morning and at bedtime.   escitalopram (LEXAPRO) 20 MG tablet Take 1 tablet (20 mg total) by mouth daily.   famotidine (PEPCID) 20 MG tablet TAKE ONE TABLET BY MOUTH EVERYDAY AT BEDTIME   fluticasone (FLONASE) 50 MCG/ACT nasal spray Place 2 sprays into both nostrils daily.   fluticasone (FLOVENT HFA) 110 MCG/ACT inhaler TAKE 2 PUFFS BY MOUTH TWICE A DAY   metaxalone (SKELAXIN) 800 MG tablet Take 800 mg by mouth daily as needed for muscle spasms.   montelukast (SINGULAIR) 10 MG tablet TAKE 1 TABLET BY MOUTH EVERYDAY AT BEDTIME   nicotine (NICOTINE STEP 1) 21 mg/24hr patch Place 1 patch (21 mg total) onto the skin daily. Remove patch  30 min before bedtime   nicotine polacrilex (COMMIT) 2 MG lozenge Take 1 lozenge (2 mg total) by mouth as needed (every 1-2 hours for cravings).   Olopatadine HCl 0.2 % SOLN Apply 1-2 drops to eye 2 (two) times daily as needed (itching, running eyes).   Oxycodone HCl 10 MG TABS Take 10 mg by mouth every 6 (six) hours as needed.   polyethylene glycol powder (GLYCOLAX/MIRALAX) 17 GM/SCOOP powder Take 17 g by mouth 2 (two) times daily as needed.   pregabalin (LYRICA) 150 MG capsule Take 150 mg by mouth 2 (two) times daily.   PROAIR HFA 108 (90 Base) MCG/ACT inhaler Inhale 1-2 puffs into the lungs every 6 (six) hours as needed for wheezing or shortness of breath.   PROCTO-MED HC 2.5 % rectal cream place RECTALLY TWICE DAILY   senna-docusate (SENNA-S) 8.6-50 MG tablet Take 1 tablet by mouth daily.   silver sulfADIAZINE (SILVADENE) 1 % cream Apply 1 application topically daily.   tamoxifen (NOLVADEX) 20 MG tablet Take 1 tablet (20 mg total) by mouth daily.   tiZANidine (ZANAFLEX) 4 MG tablet Take 4 mg by mouth as needed.    ROS:  Review of Systems  Constitutional:  Negative for fever.  Cardiovascular:  Positive for PND.  Genitourinary:  Positive for frequency. Negative for dysuria and hematuria.       (+) feeling of not emptying bladder    Objective:   Today's Vitals: BP (!) 149/91 (BP Location: Left Arm, Patient Position: Sitting, Cuff Size: Normal)   Pulse 81   Temp 98.9 F (37.2 C) (Oral)   Ht 4\' 11"  (1.499 m)   Wt 187 lb 12.8 oz (85.2 kg)   LMP 07/20/2017   SpO2 96%   BMI 37.93 kg/m  Vitals with BMI 08/24/2021 07/17/2021 01/16/2021  Height 4\' 11"  4\' 11"  4' 11.016"  Weight 187 lbs 13 oz 177 lbs 180 lbs  BMI 37.91 10.27 25.36  Systolic 644 034 742  Diastolic 91 80 70  Pulse 81 81 76     Physical Exam Exam conducted with a chaperone present.  Genitourinary:    General: Normal vulva.     Labia:        Right: No rash or lesion.        Left: No rash or lesion.      Urethra: No  urethral swelling  or urethral lesion.     Vagina: No foreign body. Prolapsed vaginal walls present. No vaginal discharge, erythema or tenderness.         Assessment and Plan   1. Urinary tract infection without hematuria, site unspecified   2. Gastroesophageal reflux disease, unspecified whether esophagitis present   3. Irritable bowel syndrome, unspecified type   4. Back pain, lumbosacral   5. Bladder prolapse, female, acquired      Plan: 1.,  5.  Urinalysis did show signs of UTI.  We will send this off for culture for further evaluation.  I think the mass she is feeling in her vaginal cavity is a prolapsed bladder.  She does have an appoint with her OB/GYN coming up and I recommend she keep this appointment but also refer her to urology for further assistance with evaluation and management.  I did tell her if her symptoms become much more severe especially if she is experiencing a lot of pain that she needs to go to the ER between now her next appointment with specialist. 2.,  3.  Referral to GI made today. 4.  We will prescribe DME for railings and bathroom.   Tests ordered Orders Placed This Encounter  Procedures   For home use only DME Other see comment   Urine Culture   Ambulatory referral to Gastroenterology   Ambulatory referral to Urology   POCT urinalysis dipstick      No orders of the defined types were placed in this encounter.   Patient to follow-up as needed.  Ailene Ards, NP

## 2021-08-25 LAB — URINE CULTURE: Result:: NO GROWTH

## 2021-08-29 ENCOUNTER — Telehealth: Payer: Self-pay | Admitting: Pharmacist

## 2021-08-29 NOTE — Progress Notes (Signed)
Chronic Care Management Pharmacy Assistant   Name: Emily [name] MRN: [id number] DOB: [birth year]  Reason for Encounter: Medication Review  Recent office visits:  07/17/21 Emily [name] (PCP) - Follow up visit. No med changes.   Recent consult visits:  07/06/21 [name] - Spondylosis of lumbar region without myelopathy or radiculopathy. Start Oxycodone 10 mg.   Hospital visits:  None in previous 6 months  Medications: Outpatient Encounter Medications as of 08/29/2021  Medication Sig   albuterol (PROVENTIL) (2.5 MG/3ML) 0.083% nebulizer solution Take 3 mLs (2.5 mg total) by nebulization every 4 (four) hours as needed for wheezing or shortness of breath.   ALLERGY RELIEF 10 MG tablet TAKE ONE TABLET BY MOUTH EVERYDAY AT BEDTIME   busPIRone (BUSPAR) 7.5 MG tablet TAKE ONE TABLET BY MOUTH EVERY MORNING and TAKE ONE TABLET BY MOUTH EVERYDAY AT BEDTIME   cyclobenzaprine (FLEXERIL) 10 MG tablet Take 10 mg by mouth daily as needed for muscle spasms.   diclofenac sodium (VOLTAREN) 1 % GEL Apply 2 g topically 3 (three) times daily as needed (for pain).   diclofenac Sodium (VOLTAREN) 1 % GEL SMARTSIG:2 Gram(s) Topical 4 Times Daily PRN   dicyclomine (BENTYL) 20 MG tablet Take 20 mg by mouth in the morning and at bedtime.   escitalopram (LEXAPRO) 20 MG tablet Take 1 tablet (20 mg total) by mouth daily.   famotidine (PEPCID) 20 MG tablet TAKE ONE TABLET BY MOUTH EVERYDAY AT BEDTIME   fluticasone (FLONASE) 50 MCG/ACT nasal spray Place 2 sprays into both nostrils daily.   fluticasone (FLOVENT HFA) 110 MCG/ACT inhaler TAKE 2 PUFFS BY MOUTH TWICE A DAY   metaxalone (SKELAXIN) 800 MG tablet Take 800 mg by mouth daily as needed for muscle spasms.   montelukast (SINGULAIR) 10 MG tablet TAKE 1 TABLET BY MOUTH EVERYDAY AT BEDTIME   nicotine (NICOTINE STEP 1) 21 mg/24hr patch Place 1 patch (21 mg total) onto the skin daily. Remove patch 30 min before bedtime   nicotine polacrilex  (COMMIT) 2 MG lozenge Take 1 lozenge (2 mg total) by mouth as needed (every 1-2 hours for cravings).   Olopatadine HCl 0.2 % SOLN Apply 1-2 drops to eye 2 (two) times daily as needed (itching, running eyes).   Oxycodone HCl 10 MG TABS Take 10 mg by mouth every 6 (six) hours as needed.   polyethylene glycol powder (GLYCOLAX/MIRALAX) 17 GM/SCOOP powder Take 17 g by mouth 2 (two) times daily as needed.   pregabalin (LYRICA) 150 MG capsule Take 150 mg by mouth 2 (two) times daily.   PROAIR HFA 108 (90 Base) MCG/ACT inhaler Inhale 1-2 puffs into the lungs every 6 (six) hours as needed for wheezing or shortness of breath.   PROCTO-MED HC 2.5 % rectal cream place RECTALLY TWICE DAILY   senna-docusate (SENNA-S) 8.6-50 MG tablet Take 1 tablet by mouth daily.   silver sulfADIAZINE (SILVADENE) 1 % cream Apply 1 application topically daily.   tamoxifen (NOLVADEX) 20 MG tablet Take 1 tablet (20 mg total) by mouth daily.   tiZANidine (ZANAFLEX) 4 MG tablet Take 4 mg by mouth as needed.   No facility-administered encounter medications on file as of 08/29/2021.    Reviewed chart for medication changes ahead of medication coordination call.  No OVs, Consults, or hospital visits since last care coordination call/Pharmacist visit. (If appropriate, list visit date, provider name)  No medication changes indicated OR if recent visit, treatment plan here.  BP Readings from Last 3 Encounters:  08/24/21 Marland Kitchen)  149/91  07/17/21 108/80  01/16/21 122/70    Lab Results  Component Value Date   HGBA1C 5.5 07/18/2020     Patient obtains medications through V[facility]  90 Days   Patient is due for next adherence delivery on: 09/11/21 Called patient and reviewed medications and coordinated delivery.  This delivery to include: Allergy Relief 10 mg Senna Plus 8.6-50 mg 1 tab daily 05/09/21 Famotidine 20 mg 1 tab at bedtime 03/20/21 Buspirone Hcl 7.5 mg 1 tab twice daily 03/20/21 Tamoxifen 20 mg 1 tab daily Montelukast 10  mg 1 tab at bedtime  Patient needs refills for : Sent via [facility]  Montelukast 10 mg  Famotidine 20 mg  Buspirone 7.5 mg   Confirmed delivery date of 09/11/21, advised patient that pharmacy will contact them the morning of delivery.  Star Rating Drugs: None Noted  PTM completed the dispensing call on 08/31/2021.  Orinda Kenner, [place] Clinical Pharmacists Assistant [phone]

## 2021-09-04 ENCOUNTER — Encounter: Payer: Self-pay | Admitting: Internal Medicine

## 2021-09-04 ENCOUNTER — Telehealth (INDEPENDENT_AMBULATORY_CARE_PROVIDER_SITE_OTHER): Payer: Medicare Other | Admitting: Internal Medicine

## 2021-09-04 ENCOUNTER — Other Ambulatory Visit: Payer: Self-pay

## 2021-09-04 ENCOUNTER — Other Ambulatory Visit: Payer: Self-pay | Admitting: Internal Medicine

## 2021-09-04 DIAGNOSIS — Z20822 Contact with and (suspected) exposure to covid-19: Secondary | ICD-10-CM | POA: Diagnosis not present

## 2021-09-04 DIAGNOSIS — F418 Other specified anxiety disorders: Secondary | ICD-10-CM

## 2021-09-04 DIAGNOSIS — K219 Gastro-esophageal reflux disease without esophagitis: Secondary | ICD-10-CM

## 2021-09-04 DIAGNOSIS — J4521 Mild intermittent asthma with (acute) exacerbation: Secondary | ICD-10-CM | POA: Diagnosis not present

## 2021-09-04 MED ORDER — PREDNISONE 20 MG PO TABS: 40.0000 mg | ORAL_TABLET | Freq: Every day | ORAL | 0 refills | Status: DC

## 2021-09-04 MED ORDER — DOXYCYCLINE HYCLATE 100 MG PO TABS
100.0000 mg | ORAL_TABLET | Freq: Two times a day (BID) | ORAL | 0 refills | Status: DC
Start: 2021-09-04 — End: 2021-09-12

## 2021-09-04 MED ORDER — FLUCONAZOLE 150 MG PO TABS: 150.0000 mg | ORAL_TABLET | Freq: Once | ORAL | 0 refills | Status: AC

## 2021-09-04 MED ORDER — PROMETHAZINE-DM 6.25-15 MG/5ML PO SYRP: 5.0000 mL | ORAL_SOLUTION | Freq: Four times a day (QID) | ORAL | 0 refills | Status: DC | PRN

## 2021-09-04 NOTE — Assessment & Plan Note (Addendum)
With flare today and rx doxycycline and prednisone and promethazine/dm.

## 2021-09-04 NOTE — Progress Notes (Signed)
Virtual Visit via Video Note  I connected with Emily [name] on 09/04/21 at  2:20 PM EDT by a video enabled telemedicine application and verified that I am speaking with the correct person using two identifiers.  The patient and the provider were at separate locations throughout the entire encounter. Patient location: home, Provider location: work   I discussed the limitations of evaluation and management by telemedicine and the availability of in person appointments. The patient expressed understanding and agreed to proceed. The patient and the provider were the only parties present for the visit unless noted in HPI below.  History of Present Illness: The patient is a 50 y.o. female with visit for cough and congestion and sinus drainage. Started about 6 days ago. Some headaches. Has asthma and still using flovent. No problems with breathing or getting out of breath easy. Using inhaler more often especially at night time.   Observations/Objective: Appearance: normal, minimal coughing visit, breathing appears normal, casual grooming, mental status is A and O times 3  Assessment and Plan: See problem oriented charting  Follow Up Instructions: rx doxycycline 1 week, rx promethazine/dm  I discussed the assessment and treatment plan with the patient. The patient was provided an opportunity to ask questions and all were answered. The patient agreed with the plan and demonstrated an understanding of the instructions.   The patient was advised to call back or seek an in-person evaluation if the symptoms worsen or if the condition fails to improve as anticipated.  Hoyt Koch, MD

## 2021-09-05 ENCOUNTER — Other Ambulatory Visit: Payer: Self-pay | Admitting: Internal Medicine

## 2021-09-05 DIAGNOSIS — J4520 Mild intermittent asthma, uncomplicated: Secondary | ICD-10-CM

## 2021-09-05 DIAGNOSIS — J301 Allergic rhinitis due to pollen: Secondary | ICD-10-CM

## 2021-09-07 DIAGNOSIS — M5136 Other intervertebral disc degeneration, lumbar region: Secondary | ICD-10-CM | POA: Diagnosis not present

## 2021-09-07 DIAGNOSIS — Z981 Arthrodesis status: Secondary | ICD-10-CM | POA: Diagnosis not present

## 2021-09-07 DIAGNOSIS — M5412 Radiculopathy, cervical region: Secondary | ICD-10-CM | POA: Diagnosis not present

## 2021-09-07 DIAGNOSIS — G894 Chronic pain syndrome: Secondary | ICD-10-CM | POA: Diagnosis not present

## 2021-09-07 DIAGNOSIS — M47816 Spondylosis without myelopathy or radiculopathy, lumbar region: Secondary | ICD-10-CM | POA: Diagnosis not present

## 2021-09-07 DIAGNOSIS — M5030 Other cervical disc degeneration, unspecified cervical region: Secondary | ICD-10-CM | POA: Diagnosis not present

## 2021-09-07 DIAGNOSIS — G8929 Other chronic pain: Secondary | ICD-10-CM | POA: Diagnosis not present

## 2021-09-07 DIAGNOSIS — M130 Polyarthritis, unspecified: Secondary | ICD-10-CM | POA: Diagnosis not present

## 2021-09-12 ENCOUNTER — Ambulatory Visit (INDEPENDENT_AMBULATORY_CARE_PROVIDER_SITE_OTHER): Payer: Medicare Other | Admitting: Internal Medicine

## 2021-09-12 ENCOUNTER — Other Ambulatory Visit: Payer: Self-pay

## 2021-09-12 ENCOUNTER — Encounter: Payer: Self-pay | Admitting: Internal Medicine

## 2021-09-12 DIAGNOSIS — J4521 Mild intermittent asthma with (acute) exacerbation: Secondary | ICD-10-CM

## 2021-09-12 DIAGNOSIS — J410 Simple chronic bronchitis: Secondary | ICD-10-CM

## 2021-09-12 MED ORDER — PREDNISONE 20 MG PO TABS: ORAL_TABLET | ORAL | 0 refills | Status: DC

## 2021-09-12 NOTE — Assessment & Plan Note (Signed)
With flare and will double dosing flovent to 4 puffs BID. Rx prednisone 40 mg daily for 1 week then 20 mg daily 1 week then stop. CXR not indicated today as no sounds consistent with pneumonia. She is strongly encouraged to stop smoking as that can cause worse respiratory infections as well as asthma flares.

## 2021-09-12 NOTE — Progress Notes (Signed)
   Subjective:   [name], female    DOB: [birth year], 50 y.o.   MRN: [id number]  HPI The patient is a 50 YO female coming in not better with breathing.  Review of Systems  Constitutional:  Positive for appetite change and fatigue.  HENT: Negative.    Eyes: Negative.   Respiratory:  Positive for cough, shortness of breath and wheezing. Negative for chest tightness.   Cardiovascular:  Negative for chest pain, palpitations and leg swelling.  Gastrointestinal:  Negative for abdominal distention, abdominal pain, constipation, diarrhea, nausea and vomiting.  Musculoskeletal: Negative.   Skin: Negative.   Neurological: Negative.   Psychiatric/Behavioral: Negative.     Objective:  Physical Exam Constitutional:      Appearance: She is well-developed.  HENT:     Head: Normocephalic and atraumatic.  Cardiovascular:     Rate and Rhythm: Normal rate and regular rhythm.  Pulmonary:     Effort: Pulmonary effort is normal. No respiratory distress.     Breath sounds: Wheezing present. No rales.  Abdominal:     General: Bowel sounds are normal. There is no distension.     Palpations: Abdomen is soft.     Tenderness: There is no abdominal tenderness. There is no rebound.  Musculoskeletal:     Cervical back: Normal range of motion.  Skin:    General: Skin is warm and dry.  Neurological:     Mental Status: She is alert and oriented to person, place, and time.     Coordination: Coordination normal.    Vitals:   09/12/21 1059  BP: 128/80  Pulse: 100  Temp: 98.4 F (36.9 C)  TempSrc: Oral  SpO2: 98%  Weight: 185 lb 9.6 oz (84.2 kg)  Height: 4\' 11"  (1.499 m)    This visit occurred during the SARS-CoV-2 public health emergency.  Safety protocols were in place, including screening questions prior to the visit, additional usage of staff PPE, and extensive cleaning of exam room while observing appropriate contact time as indicated for disinfecting solutions.    Assessment & Plan:  Visit time 20 minutes in face to face communication with patient and coordination of care, additional 10 minutes spent in record review, coordination or care, ordering tests, communicating/referring to other healthcare professionals, documenting in medical records all on the same day of the visit for total time 30 minutes spent on the visit.

## 2021-09-12 NOTE — Patient Instructions (Signed)
We will have you double the flovent dosage to 4 puffs twice a day for 1 week then go back to normal dosing as long as you are feeling better.  We have sent in prednisone to take 2 pills daily for 1 week, then 1 pill daily for 1 week.

## 2021-09-12 NOTE — Assessment & Plan Note (Signed)
Not smoking as much due to flare of asthma and advised to quit.

## 2021-09-18 ENCOUNTER — Other Ambulatory Visit: Payer: Self-pay | Admitting: Internal Medicine

## 2021-09-18 DIAGNOSIS — F418 Other specified anxiety disorders: Secondary | ICD-10-CM

## 2021-09-18 DIAGNOSIS — J4520 Mild intermittent asthma, uncomplicated: Secondary | ICD-10-CM

## 2021-09-27 ENCOUNTER — Ambulatory Visit (INDEPENDENT_AMBULATORY_CARE_PROVIDER_SITE_OTHER): Payer: Medicare Other | Admitting: *Deleted

## 2021-09-27 DIAGNOSIS — F418 Other specified anxiety disorders: Secondary | ICD-10-CM

## 2021-09-27 DIAGNOSIS — F411 Generalized anxiety disorder: Secondary | ICD-10-CM

## 2021-09-27 DIAGNOSIS — M545 Low back pain, unspecified: Secondary | ICD-10-CM

## 2021-09-27 NOTE — Patient Instructions (Signed)
Visit Information  (Copy and paste patient goals from clinical care plan here)  The patient verbalized understanding of instructions, educational materials, and care plan provided today and declined offer to receive copy of patient instructions, educational materials, and care plan.   Telephone follow up appointment with care management team member scheduled for:10/05/21 Eduard Clos MSW, LCSW Licensed Clinical Social Worker [place] [phone]

## 2021-09-27 NOTE — Chronic Care Management (AMB) (Signed)
Chronic Care Management    Clinical Social Work Note  09/27/2021 Name: Emily [name] MRN: [id number] DOB: 28-May-1971  Emily [name] is a 50 y.o. year old female who is a primary care patient of [name] Real Cons, MD. The CCM team was consulted to assist the patient with chronic disease management and/or care coordination needs related to: [employer]  and [employer].   Engaged with patient by telephone for follow up visit in response to provider referral for social work chronic care management and care coordination services.   Consent to Services:  The patient was given information about Chronic Care Management services, agreed to services, and gave verbal consent prior to initiation of services.  Please see initial visit note for detailed documentation.   Patient agreed to services and consent obtained.   Assessment: Review of patient past medical history, allergies, medications, and health status, including review of relevant consultants reports was performed today as part of a comprehensive evaluation and provision of chronic care management and care coordination services.     SDOH (Social Determinants of Health) assessments and interventions performed:  SDOH Interventions    Flowsheet Row Most Recent Value  SDOH Interventions   Stress Interventions Provide Counseling        Advanced Directives Status: Not addressed in this encounter.  CCM Care Plan  Allergies  Allergen Reactions   Other Hives    Dermabond   Wound Dressing Adhesive Rash   Morphine Other (See Comments)    Drops BP    Morphine And Related     Lowers BP too much   Tape Dermatitis    Outpatient Encounter Medications as of 09/27/2021  Medication Sig   albuterol (PROVENTIL) (2.5 MG/3ML) 0.083% nebulizer solution Take 3 mLs (2.5 mg total) by nebulization every 4 (four) hours as needed for wheezing or shortness of breath.   ALLERGY RELIEF 10 MG  tablet TAKE ONE TABLET BY MOUTH EVERYDAY AT BEDTIME   busPIRone (BUSPAR) 7.5 MG tablet TAKE ONE TABLET BY MOUTH EVERY MORNING and TAKE ONE TABLET BY MOUTH EVERYDAY AT BEDTIME   cyclobenzaprine (FLEXERIL) 10 MG tablet Take 10 mg by mouth daily as needed for muscle spasms.   diclofenac Sodium (VOLTAREN) 1 % GEL SMARTSIG:2 Gram(s) Topical 4 Times Daily PRN   dicyclomine (BENTYL) 20 MG tablet Take 20 mg by mouth in the morning and at bedtime.   escitalopram (LEXAPRO) 20 MG tablet Take 1 tablet (20 mg total) by mouth daily.   famotidine (PEPCID) 20 MG tablet TAKE ONE TABLET BY MOUTH AT BEDTIME   fluticasone (FLONASE) 50 MCG/ACT nasal spray Place 2 sprays into both nostrils daily.   fluticasone (FLOVENT HFA) 110 MCG/ACT inhaler TAKE 2 PUFFS BY MOUTH TWICE A DAY   metaxalone (SKELAXIN) 800 MG tablet Take 800 mg by mouth daily as needed for muscle spasms.   montelukast (SINGULAIR) 10 MG tablet TAKE ONE TABLET BY MOUTH EVERYDAY AT BEDTIME   nicotine (NICOTINE STEP 1) 21 mg/24hr patch Place 1 patch (21 mg total) onto the skin daily. Remove patch 30 min before bedtime   nicotine polacrilex (COMMIT) 2 MG lozenge Take 1 lozenge (2 mg total) by mouth as needed (every 1-2 hours for cravings).   Olopatadine HCl 0.2 % SOLN Apply 1-2 drops to eye 2 (two) times daily as needed (itching, running eyes).   Oxycodone HCl 10 MG TABS Take 10 mg by mouth every 6 (six) hours as needed.   polyethylene glycol powder (GLYCOLAX/MIRALAX) 17  GM/SCOOP powder Take 17 g by mouth 2 (two) times daily as needed.   predniSONE (DELTASONE) 20 MG tablet Days 1-7 take 2 pills daily, days 8-14 take 1 pill daily   pregabalin (LYRICA) 150 MG capsule Take 150 mg by mouth 2 (two) times daily.   PROAIR HFA 108 (90 Base) MCG/ACT inhaler Inhale 1-2 puffs into the lungs every 6 (six) hours as needed for wheezing or shortness of breath.   PROCTO-MED HC 2.5 % rectal cream place RECTALLY TWICE DAILY   promethazine-dextromethorphan (PROMETHAZINE-DM)  6.25-15 MG/5ML syrup Take 5 mLs by mouth 4 (four) times daily as needed for cough.   senna-docusate (SENNA-S) 8.6-50 MG tablet Take 1 tablet by mouth daily.   silver sulfADIAZINE (SILVADENE) 1 % cream Apply 1 application topically daily.   tamoxifen (NOLVADEX) 20 MG tablet Take 1 tablet (20 mg total) by mouth daily.   tiZANidine (ZANAFLEX) 4 MG tablet Take 4 mg by mouth as needed.   No facility-administered encounter medications on file as of 09/27/2021.    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 09/24/2019   Anxiety state 05/06/2017   Iron deficiency anemia 12/16/2015   Hemorrhoids 06/14/2015   Trigger index finger of right hand 05/17/2015   Encounter for general adult medical examination with abnormal findings 05/12/2015   Bilateral carpal tunnel syndrome 03/29/2015   Left thyroid nodule    Back pain, lumbosacral    Depression with anxiety 03/22/2010   MIGRAINE HEADACHE 03/22/2010   Essential hypertension 03/22/2010   Allergic rhinitis 03/22/2010   Asthma 03/22/2010   GERD 03/22/2010   ARTHRITIS, SHOULDER 03/22/2010   Major depressive disorder, single episode 03/22/2010    Conditions to be addressed/monitored: Depression; Mental Health Concerns  and Lacks knowledge of community resource:    Care Plan : LCSW Plan of Care  Updates made by Deirdre Peer, LCSW since 09/27/2021 12:00 AM     Problem: Mental Health Needs- Depression   Priority: High     Long-Range Goal: Develop Self Care Plan for Management of Depression   Start Date: 05/17/2021  Expected End Date: 11/17/2021  This Visit's Progress: On track  Recent Progress: On track  Priority: High  Note:   Current barriers:    Chronic Mental Health needs related to depression Mental Health Concerns  and Lacks knowledge of community resources Needs Support, Education, and Care Coordination in order to meet unmet mental health needs. Clinical Goal(s): patient will work with SW to address concerns related to depression/mental health support needs   Clinical Interventions:  Pt is willing to do virtual visits with a mental health counselor and will follow up by phone today with [facility] to schedule. CSW validated pt's feelings of being overwhelmed with all her appointments and things to accomplish.  Pt also shares feeling "stress and tired..... and dreading the holidays".  Pt is hopeful to get connected with counseling asap to help with her coping and preparing for the holiday season.  Pt inquiring about nebulizer tubing; says she has been using her daughter's machine (since 1999).  CSW will ask PCP to follow up with pt on this.  As well, pt complains about hotflashes- will ask Pharmacy to follow up as well.  Assessed patient's previous and current treatment, coping skills, support system and barriers to care  Review various resources, discussed options and provided patient information about Discussed several options for long term counseling based on need and insurance and [place] for counseling  interventions provided: Solution-Focused Strategies and Discussed referral to [facility] to assist with connecting to mental health provider ; Patient interviewed and appropriate assessments performed Referred patient to [place] (mental health provider) for long term follow up and therapy/counseling Discussed several options for long term counseling based on need and insurance.  Collaboration with PCP regarding development and update of comprehensive plan of care as evidenced by provider attestation and co-signature Inter-disciplinary care team collaboration (see longitudinal plan of care) Patient Goals/Self-Care  Activities: Over the next 30 days  avoid negative self-talk -call [facility] to request assistance with in-network  Mental health appointments [phone] -call 911 or Crisis Line if worsening feelings surface - develop a personal safety plan - develop a plan to deal with triggers like holidays, anniversaries - have a plan for how to handle bad days - journal feelings and what helps to feel better or worse - spend time or talk with others at least 2 to 3 times per week - spend time or talk with others every day - watch for early signs of feeling worse - begin personal counseling - call and visit an old friend - join a support group - laugh; watch a funny movie or comedian - practice relaxation or meditation daily - start or continue a personal journal - practice positive thinking and self-talk I have placed a referral with [facility] to assist with connecting you with a mental health provider. they will contact you once a provider is located.      Follow Up Plan: Appointment scheduled for SW follow up with client by phone on: 10/05/21      Eduard Clos MSW, [place] Licensed Clinical Social Worker [place] [phone]

## 2021-10-03 ENCOUNTER — Other Ambulatory Visit: Payer: Self-pay

## 2021-10-03 ENCOUNTER — Ambulatory Visit: Payer: Medicare Other | Admitting: Pharmacist

## 2021-10-03 DIAGNOSIS — F418 Other specified anxiety disorders: Secondary | ICD-10-CM

## 2021-10-03 DIAGNOSIS — R232 Flushing: Secondary | ICD-10-CM

## 2021-10-03 DIAGNOSIS — Z720 Tobacco use: Secondary | ICD-10-CM

## 2021-10-03 DIAGNOSIS — I10 Essential (primary) hypertension: Secondary | ICD-10-CM

## 2021-10-03 DIAGNOSIS — C50919 Malignant neoplasm of unspecified site of unspecified female breast: Secondary | ICD-10-CM

## 2021-10-03 DIAGNOSIS — J4521 Mild intermittent asthma with (acute) exacerbation: Secondary | ICD-10-CM

## 2021-10-03 DIAGNOSIS — T451X5A Adverse effect of antineoplastic and immunosuppressive drugs, initial encounter: Secondary | ICD-10-CM
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
## 2021-10-05 ENCOUNTER — Ambulatory Visit: Payer: Medicare Other | Admitting: *Deleted

## 2021-10-05 DIAGNOSIS — F411 Generalized anxiety disorder: Secondary | ICD-10-CM

## 2021-10-05 DIAGNOSIS — M545 Low back pain, unspecified: Secondary | ICD-10-CM

## 2021-10-05 DIAGNOSIS — F418 Other specified anxiety disorders: Secondary | ICD-10-CM

## 2021-10-05 DIAGNOSIS — I10 Essential (primary) hypertension: Secondary | ICD-10-CM

## 2021-10-05 DIAGNOSIS — N390 Urinary tract infection, site not specified: Secondary | ICD-10-CM

## 2021-10-05 NOTE — Chronic Care Management (AMB) (Signed)
Chronic Care Management    Clinical Social Work Note  10/05/2021 Name: Emily [name] MRN: [id number] DOB: 1971/02/04  Emily [name] is a 50 y.o. year old female who is a primary care patient of Emily [name] Real Cons, MD. The CCM team was consulted to assist the patient with chronic disease management and/or care coordination needs related to: Mental Health Counseling and Resources.   Engaged with patient by telephone for follow up visit in response to provider referral for social work chronic care management and care coordination services.   Consent to Services:  The patient was given information about Chronic Care Management services, agreed to services, and gave verbal consent prior to initiation of services.  Please see initial visit note for detailed documentation.   Patient agreed to services and consent obtained.   Assessment: Review of patient past medical history, allergies, medications, and health status, including review of relevant consultants reports was performed today as part of a comprehensive evaluation and provision of chronic care management and care coordination services.     SDOH (Social Determinants of Health) assessments and interventions performed:    Advanced Directives Status: Not addressed in this encounter.  CCM Care Plan  Allergies  Allergen Reactions   Other Hives    Dermabond   Wound Dressing Adhesive Rash   Morphine Other (See Comments)    Drops BP    Morphine And Related     Lowers BP too much   Tape Dermatitis    Outpatient Encounter Medications as of 10/05/2021  Medication Sig   albuterol (PROVENTIL) (2.5 MG/3ML) 0.083% nebulizer solution Take 3 mLs (2.5 mg total) by nebulization every 4 (four) hours as needed for wheezing or shortness of breath.   ALLERGY RELIEF 10 MG tablet TAKE ONE TABLET BY MOUTH EVERYDAY AT BEDTIME   busPIRone (BUSPAR) 7.5 MG tablet TAKE ONE TABLET BY MOUTH EVERY MORNING and TAKE ONE TABLET BY MOUTH  EVERYDAY AT BEDTIME   cyclobenzaprine (FLEXERIL) 10 MG tablet Take 10 mg by mouth daily as needed for muscle spasms.   diclofenac Sodium (VOLTAREN) 1 % GEL SMARTSIG:2 Gram(s) Topical 4 Times Daily PRN   dicyclomine (BENTYL) 20 MG tablet Take 20 mg by mouth in the morning and at bedtime.   escitalopram (LEXAPRO) 20 MG tablet Take 1 tablet (20 mg total) by mouth daily.   famotidine (PEPCID) 20 MG tablet TAKE ONE TABLET BY MOUTH AT BEDTIME   fluticasone (FLONASE) 50 MCG/ACT nasal spray Place 2 sprays into both nostrils daily.   fluticasone (FLOVENT HFA) 110 MCG/ACT inhaler TAKE 2 PUFFS BY MOUTH TWICE A DAY   metaxalone (SKELAXIN) 800 MG tablet Take 800 mg by mouth daily as needed for muscle spasms.   montelukast (SINGULAIR) 10 MG tablet TAKE ONE TABLET BY MOUTH EVERYDAY AT BEDTIME   nicotine (NICOTINE STEP 1) 21 mg/24hr patch Place 1 patch (21 mg total) onto the skin daily. Remove patch 30 min before bedtime   nicotine polacrilex (COMMIT) 2 MG lozenge Take 1 lozenge (2 mg total) by mouth as needed (every 1-2 hours for cravings).   Olopatadine HCl 0.2 % SOLN Apply 1-2 drops to eye 2 (two) times daily as needed (itching, running eyes).   Oxycodone HCl 10 MG TABS Take 10 mg by mouth every 6 (six) hours as needed.   polyethylene glycol powder (GLYCOLAX/MIRALAX) 17 GM/SCOOP powder Take 17 g by mouth 2 (two) times daily as needed.   predniSONE (DELTASONE) 20 MG tablet Days 1-7 take 2 pills daily, days 8-14  take 1 pill daily   pregabalin (LYRICA) 150 MG capsule Take 150 mg by mouth 2 (two) times daily.   PROAIR HFA 108 (90 Base) MCG/ACT inhaler Inhale 1-2 puffs into the lungs every 6 (six) hours as needed for wheezing or shortness of breath.   PROCTO-MED HC 2.5 % rectal cream place RECTALLY TWICE DAILY   promethazine-dextromethorphan (PROMETHAZINE-DM) 6.25-15 MG/5ML syrup Take 5 mLs by mouth 4 (four) times daily as needed for cough.   senna-docusate (SENNA-S) 8.6-50 MG tablet Take 1 tablet by mouth daily.    silver sulfADIAZINE (SILVADENE) 1 % cream Apply 1 application topically daily.   tamoxifen (NOLVADEX) 20 MG tablet Take 1 tablet (20 mg total) by mouth daily.   tiZANidine (ZANAFLEX) 4 MG tablet Take 4 mg by mouth as needed.   No facility-administered encounter medications on file as of 10/05/2021.    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 09/24/2019   Anxiety state 05/06/2017   Iron deficiency anemia 12/16/2015   Hemorrhoids 06/14/2015   Trigger index finger of right hand 05/17/2015   Encounter for general adult medical examination with abnormal findings 05/12/2015   Bilateral carpal tunnel syndrome 03/29/2015   Left thyroid nodule    Back pain, lumbosacral    Depression with anxiety 03/22/2010   MIGRAINE HEADACHE 03/22/2010   Essential hypertension 03/22/2010   Allergic rhinitis 03/22/2010   Asthma 03/22/2010   GERD 03/22/2010   ARTHRITIS, SHOULDER 03/22/2010   Major depressive disorder, single episode 03/22/2010    Conditions to be addressed/monitored: Depression; Mental Health Concerns   Care Plan : LCSW Plan of Care  Updates made by Deirdre Peer, LCSW since 10/05/2021 12:00 AM     Problem: Mental Health Needs- Depression   Priority: High     Long-Range Goal: Develop Self Care Plan for Management of Depression   Start Date: 05/17/2021  Expected End Date: 11/17/2021  This Visit's Progress: Not on track  Recent Progress: On track  Priority: High  Note:   Current barriers:   Chronic Mental Health needs related to depression Mental Health Concerns  and Lacks knowledge of community resources Needs Support, Education, and Care Coordination in order to meet unmet mental  health needs. Clinical Goal(s): patient will work with SW to address concerns related to depression/mental health support needs   Clinical Interventions:  10/05/21- Pt reports she has not had time to call [facility] to get scheduled with counseling- "lots going on with the family". She also has not heard anything about the nebulizer machine- suggested pt send a MYCHART message to PCP office requesting this.  CSW stressed to pt the importance of her taking the initiative to seek counseling appointment so she can get support during the holiday season as she voiced is a difficult time for her.   09/27/21- Pt is willing to do virtual visits with a mental health counselor and will follow up by phone today with [facility] to schedule. CSW validated pt's feelings of being overwhelmed with all her appointments and things to accomplish.  Pt also shares feeling "stress and tired..... and dreading the holidays".  Pt is hopeful to get connected with counseling asap to help with her coping and preparing for the  holiday season.  Pt inquiring about nebulizer tubing; says she has been using her daughter's machine (since 1999).  CSW will ask PCP to follow up with pt on this.  As well, pt complains about hotflashes- will ask Pharmacy to follow up as well.      Assessed patient's previous and current treatment, coping skills, support system and barriers to care  Review various resources, discussed options and provided patient information about Discussed several options for long term counseling based on need and insurance and [facility] for counseling  interventions provided: Solution-Focused Strategies and Discussed referral to [facility] to assist with connecting to mental health provider ; Patient interviewed and appropriate assessments performed Referred patient to [facility] (mental health provider) for long term follow up and therapy/counseling Discussed several options for long term counseling based on need and  insurance.  Collaboration with PCP regarding development and update of comprehensive plan of care as evidenced by provider attestation and co-signature Inter-disciplinary care team collaboration (see longitudinal plan of care) Patient Goals/Self-Care Activities: Over the next 30 days  avoid negative self-talk -call [facility] to request assistance with in-network  Mental health appointments [phone] -call 911 or Crisis Line if worsening feelings surface - develop a personal safety plan - develop a plan to deal with triggers like holidays, anniversaries - have a plan for how to handle bad days - journal feelings and what helps to feel better or worse - spend time or talk with others at least 2 to 3 times per week - spend time or talk with others every day - watch for early signs of feeling worse - begin personal counseling - call and visit an old friend - join a support group - laugh; watch a funny movie or comedian - practice relaxation or meditation daily - start or continue a personal journal - practice positive thinking and self-talk I have placed a referral with [facility] to assist with connecting you with a mental health provider. they will contact you once a provider is located.      Follow Up Plan: Appointment scheduled for SW follow up with client by phone on: 11/06/21      Eduard Clos MSW, [place] Licensed Clinical Social Worker [place] [phone]

## 2021-10-05 NOTE — Patient Instructions (Signed)
Visit Information  Thank you for taking time to visit with me today. Please don't hesitate to contact me if I can be of assistance to you before our next scheduled telephone appointment.  Telephone follow up appointment with care management team member scheduled for:  If you need to cancel or re-schedule our visit, please call [phone] and our care guide team will be happy to assist you.  Following is a list of the goals we discussed today:  (Copy and paste patient goals from clinical care plan here)  Patient verbalizes understanding of instructions provided today and agrees to view in [facility].   Eduard Clos MSW, LCSW Licensed Clinical Social Worker [facility] [phone]

## 2021-10-09 ENCOUNTER — Other Ambulatory Visit (INDEPENDENT_AMBULATORY_CARE_PROVIDER_SITE_OTHER): Payer: Medicare Other

## 2021-10-09 ENCOUNTER — Encounter: Payer: Self-pay | Admitting: Gastroenterology

## 2021-10-09 ENCOUNTER — Ambulatory Visit (INDEPENDENT_AMBULATORY_CARE_PROVIDER_SITE_OTHER): Payer: Medicare Other | Admitting: Gastroenterology

## 2021-10-09 VITALS — BP 100/70 | HR 88 | Ht 59.75 in | Wt 185.5 lb

## 2021-10-09 DIAGNOSIS — R1010 Upper abdominal pain, unspecified: Secondary | ICD-10-CM | POA: Diagnosis not present

## 2021-10-09 DIAGNOSIS — R140 Abdominal distension (gaseous): Secondary | ICD-10-CM

## 2021-10-09 DIAGNOSIS — R922 Inconclusive mammogram: Secondary | ICD-10-CM | POA: Diagnosis not present

## 2021-10-09 DIAGNOSIS — L7634 Postprocedural seroma of skin and subcutaneous tissue following other procedure: Secondary | ICD-10-CM | POA: Diagnosis not present

## 2021-10-09 DIAGNOSIS — K5900 Constipation, unspecified: Secondary | ICD-10-CM | POA: Diagnosis not present

## 2021-10-09 DIAGNOSIS — I10 Essential (primary) hypertension: Secondary | ICD-10-CM | POA: Diagnosis not present

## 2021-10-09 DIAGNOSIS — N6489 Other specified disorders of breast: Secondary | ICD-10-CM | POA: Diagnosis not present

## 2021-10-09 DIAGNOSIS — K219 Gastro-esophageal reflux disease without esophagitis: Secondary | ICD-10-CM | POA: Diagnosis not present

## 2021-10-09 DIAGNOSIS — Z9289 Personal history of other medical treatment: Secondary | ICD-10-CM | POA: Diagnosis not present

## 2021-10-09 DIAGNOSIS — Z9889 Other specified postprocedural states: Secondary | ICD-10-CM | POA: Diagnosis not present

## 2021-10-09 DIAGNOSIS — Z853 Personal history of malignant neoplasm of breast: Secondary | ICD-10-CM | POA: Diagnosis not present

## 2021-10-09 DIAGNOSIS — Z9011 Acquired absence of right breast and nipple: Secondary | ICD-10-CM | POA: Diagnosis not present

## 2021-10-09 DIAGNOSIS — C50911 Malignant neoplasm of unspecified site of right female breast: Secondary | ICD-10-CM | POA: Diagnosis not present

## 2021-10-09 LAB — CBC
HCT: 42.7 % (ref 36.0–46.0)
Hemoglobin: 14.1 g/dL (ref 12.0–15.0)
MCHC: 33 g/dL (ref 30.0–36.0)
MCV: 88.5 fl (ref 78.0–100.0)
Platelets: 212 10*3/uL (ref 150.0–400.0)
RBC: 4.82 Mil/uL (ref 3.87–5.11)
RDW: 12.4 % (ref 11.5–15.5)
WBC: 7.2 10*3/uL (ref 4.0–10.5)

## 2021-10-09 LAB — COMPREHENSIVE METABOLIC PANEL
ALT: 18 U/L (ref 0–35)
AST: 25 U/L (ref 0–37)
Albumin: 4.4 g/dL (ref 3.5–5.2)
Alkaline Phosphatase: 92 U/L (ref 39–117)
BUN: 6 mg/dL (ref 6–23)
CO2: 32 mEq/L (ref 19–32)
Calcium: 9.8 mg/dL (ref 8.4–10.5)
Chloride: 104 mEq/L (ref 96–112)
Creatinine, Ser: 0.83 mg/dL (ref 0.40–1.20)
GFR: 82.19 mL/min (ref >60.00)
Glucose, Bld: 100 mg/dL — ABNORMAL HIGH (ref 70–99)
Potassium: 4.4 mEq/L (ref 3.5–5.1)
Sodium: 141 mEq/L (ref 135–145)
Total Bilirubin: 0.5 mg/dL (ref 0.2–1.2)
Total Protein: 7.8 g/dL (ref 6.0–8.3)

## 2021-10-09 LAB — TSH: TSH: 0.46 u[IU]/mL (ref 0.35–5.50)

## 2021-10-09 LAB — LIPASE: Lipase: 30 U/L (ref 11.0–59.0)

## 2021-10-09 MED ORDER — DICYCLOMINE HCL 20 MG PO TABS: 20.0000 mg | ORAL_TABLET | Freq: Four times a day (QID) | ORAL | 11 refills | Status: DC

## 2021-10-09 MED ORDER — PANTOPRAZOLE SODIUM 40 MG PO TBEC: 40.0000 mg | DELAYED_RELEASE_TABLET | Freq: Every day | ORAL | 3 refills | Status: DC

## 2021-10-09 NOTE — Progress Notes (Signed)
History of Present Illness: This is a 50 year old female referred by Ailene Ards, NP for the evaluation of GERD, upper abdominal pain, abdominal bloating, constipation. She was previously followed by me in 2018, then she changed gastroenterologists in 2019 to Maura Crandall, MD where she was followed for IBS-C, abdominal bloating and GERD.  She is now referred back to me.  She relates frequent epigastric and upper abdominal pain associated with gas, belching and burning.  She sometimes notes early satiety.  She notes frequent generalized abdominal bloating.  Constipation is relatively well controlled on her current medication. Denies weight loss, abdominal pain, constipation, change in stool caliber, melena, hematochezia, nausea, vomiting, dysphagia, chest pain.   Colonoscopy 07/2017 - Two 6 to 8 mm polyps in the sigmoid colon and in the transverse colon, removed with a cold snare. Resected and retrieved. Path: hyperplastic - Internal hemorrhoids. - The examination was otherwise normal on direct and retroflexion views.    Allergies  Allergen Reactions   Other Hives    Dermabond   Wound Dressing Adhesive Rash   Morphine Other (See Comments)    Drops BP    Morphine And Related     Lowers BP too much   Tape Dermatitis   Outpatient Medications Prior to Visit  Medication Sig Dispense Refill   albuterol (PROVENTIL) (2.5 MG/3ML) 0.083% nebulizer solution Take 3 mLs (2.5 mg total) by nebulization every 4 (four) hours as needed for wheezing or shortness of breath. 450 mL 1   ALLERGY RELIEF 10 MG tablet TAKE ONE TABLET BY MOUTH EVERYDAY AT BEDTIME 90 tablet 0   busPIRone (BUSPAR) 7.5 MG tablet TAKE ONE TABLET BY MOUTH EVERY MORNING and TAKE ONE TABLET BY MOUTH EVERYDAY AT BEDTIME 180 tablet 0   cyclobenzaprine (FLEXERIL) 10 MG tablet Take 10 mg by mouth daily as needed for muscle spasms.     diclofenac Sodium (VOLTAREN) 1 % GEL SMARTSIG:2 Gram(s) Topical 4 Times Daily PRN     dicyclomine  (BENTYL) 20 MG tablet Take 20 mg by mouth in the morning and at bedtime.     escitalopram (LEXAPRO) 20 MG tablet Take 1 tablet (20 mg total) by mouth daily. 90 tablet 1   famotidine (PEPCID) 20 MG tablet TAKE ONE TABLET BY MOUTH AT BEDTIME 90 tablet 0   fluticasone (FLONASE) 50 MCG/ACT nasal spray Place 2 sprays into both nostrils daily. 48 g 1   fluticasone (FLOVENT HFA) 110 MCG/ACT inhaler TAKE 2 PUFFS BY MOUTH TWICE A DAY 36 each 1   metaxalone (SKELAXIN) 800 MG tablet Take 800 mg by mouth daily as needed for muscle spasms.     montelukast (SINGULAIR) 10 MG tablet TAKE ONE TABLET BY MOUTH EVERYDAY AT BEDTIME 90 tablet 0   nicotine (NICOTINE STEP 1) 21 mg/24hr patch Place 1 patch (21 mg total) onto the skin daily. Remove patch 30 min before bedtime 28 patch 0   nicotine polacrilex (COMMIT) 2 MG lozenge Take 1 lozenge (2 mg total) by mouth as needed (every 1-2 hours for cravings). 100 tablet 0   Olopatadine HCl 0.2 % SOLN Apply 1-2 drops to eye 2 (two) times daily as needed (itching, running eyes). 5 mL 2   ondansetron (ZOFRAN-ODT) 4 MG disintegrating tablet Take 4 mg by mouth as needed.     Oxycodone HCl 10 MG TABS Take 10 mg by mouth every 6 (six) hours as needed.     polyethylene glycol powder (GLYCOLAX/MIRALAX) 17 GM/SCOOP powder Take 17 g by mouth 2 (  two) times daily as needed. 3350 g 1   predniSONE (DELTASONE) 20 MG tablet Days 1-7 take 2 pills daily, days 8-14 take 1 pill daily 21 tablet 0   pregabalin (LYRICA) 150 MG capsule Take 150 mg by mouth 2 (two) times daily.     PROAIR HFA 108 (90 Base) MCG/ACT inhaler Inhale 1-2 puffs into the lungs every 6 (six) hours as needed for wheezing or shortness of breath. 8.5 g 5   PROCTO-MED HC 2.5 % rectal cream place RECTALLY TWICE DAILY 28 g 0   promethazine-dextromethorphan (PROMETHAZINE-DM) 6.25-15 MG/5ML syrup Take 5 mLs by mouth 4 (four) times daily as needed for cough. 118 mL 0   senna-docusate (SENNA-S) 8.6-50 MG tablet Take 1 tablet by mouth  daily. 90 tablet 3   silver sulfADIAZINE (SILVADENE) 1 % cream Apply 1 application topically daily. 50 g 0   tamoxifen (NOLVADEX) 20 MG tablet Take 1 tablet (20 mg total) by mouth daily. 90 tablet 4   tiZANidine (ZANAFLEX) 4 MG tablet Take 4 mg by mouth as needed.     No facility-administered medications prior to visit.   Past Medical History:  Diagnosis Date   Allergic rhinitis    Anal fissure    Anemia    Anxiety    Arthritis    Asthma    uses inhaler as needed   Breast cancer ([place])    Chronic neck pain    pain mgmt in [place]   Depression    Gallbladder disease    GERD (gastroesophageal reflux disease)    Headache(784.0)    HTN (hypertension)    BP has been normal recently per pt and not taking meds   Hyperthyroidism    Obesity    PONV (postoperative nausea and vomiting)    Thyroid nodule    incidental on MRI neck 2014, s/p endo eval   Uterine fibroid    Past Surgical History:  Procedure Laterality Date   BREAST LUMPECTOMY Right 2021   BREAST LUMPECTOMY WITH RADIOACTIVE SEED LOCALIZATION Right 09/23/2019   Procedure: RIGHT BREAST LUMPECTOMY WITH RADIOACTIVE SEED LOCALIZATION;  Surgeon: Erroll Luna, MD;  Location[place];  Service: General;  Laterality: Right;   BREAST REDUCTION SURGERY Right    with partial mastectomy   CERVICAL SPINE SURGERY  2015   2 disc removed   [place] Malformation  2003   [place] N/A 03/15/2014   Procedure: DILATATION & CURETTAGE/HYSTEROSCOPY WITH NOVASURE ABLATION;  Surgeon: Lovenia Kim, MD;  Location: [facility];  Service: Gynecology;  Laterality: N/A;   Laberal tear  2001   Right Shoulder   LAPAROSCOPIC TOTAL HYSTERECTOMY  02/03/2018   LUMBAR DISC SURGERY     L4-L5   MOUTH SURGERY     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)   WISDOM TOOTH EXTRACTION  1992   Social History   Socioeconomic History   Marital status: Divorced    Spouse  name: Not on file   Number of children: 2   Years of education: Not on file   Highest education level: Not on file  Occupational History   Occupation: disabled  Tobacco Use   Smoking status: Every Day    Packs/day: 1.00    Years: 18.00    Pack years: 18.00    Types: Cigarettes   Smokeless tobacco: Never   Tobacco comments:    starting NRT patches/lozenges 03/2021  Vaping Use   Vaping Use:  Never used  Substance and Sexual Activity   Alcohol use: No    Alcohol/week: 0.0 standard drinks   Drug use: No   Sexual activity: Yes  Other Topics Concern   Not on file  Social History Narrative   Lives w/fiance and daughter.   On disability.   Social Determinants of Health   Financial Resource Strain: Low Risk    Difficulty of Paying Living Expenses: Not hard at all  Food Insecurity: No Food Insecurity   Worried About Charity fundraiser in the Last Year: Never true   [place] in the Last Year: Never true  Transportation Needs: No Transportation Needs   Lack of Transportation (Medical): No   Lack of Transportation (Non-Medical): No  Physical Activity: Inactive   Days of Exercise per Week: 0 days   Minutes of Exercise per Session: 0 min  Stress: Stress Concern Present   Feeling of Stress : To some extent  Social Connections: Engineer, building services of Communication with Friends and Family: More than three times a week   Frequency of Social Gatherings with Friends and Family: Once a week   Attends Religious Services: 1 to 4 times per year   Active Member of [employer] or Organizations: No   Attends Music therapist: 1 to 4 times per year   Marital Status: Living with partner   Family History  Problem Relation Age of Onset   Breast cancer Mother        over 65 when dx   Diverticulitis Mother    Arthritis Mother    Hyperlipidemia Mother    Hypertension Mother    Pancreatic cancer Paternal Grandmother    Breast cancer Paternal Aunt        x 2 pat aunts    Irritable bowel syndrome Daughter    Hypotension Daughter    Alcohol abuse Son    Alcohol abuse Paternal Uncle    Alcohol abuse Maternal Uncle        x several   Colon cancer Other        maternal grandmothers sister       Review of Systems: Pertinent positive and negative review of systems were noted in the above HPI section. All other review of systems were otherwise negative.    Physical Exam: General: Well developed, well nourished, no acute distress Head: Normocephalic and atraumatic Eyes: Sclerae anicteric, EOMI Ears: Normal auditory acuity Mouth: Not examined, mask on during Covid-19 pandemic Neck: Supple, no masses or thyromegaly Lungs: Clear throughout to auscultation Heart: Regular rate and rhythm; no murmurs, rubs or bruits Abdomen: Soft, non tender and non distended. No masses, hepatosplenomegaly or hernias noted. Normal Bowel sounds Rectal:  Not done Musculoskeletal: Symmetrical with no gross deformities  Skin: No lesions on visible extremities Pulses:  Normal pulses noted Extremities: No clubbing, cyanosis, edema or deformities noted Neurological: Alert oriented x 4, grossly nonfocal Cervical Nodes:  No significant cervical adenopathy Inguinal Nodes: No significant inguinal adenopathy Psychological:  Alert and cooperative. Normal mood and affect   Assessment and Recommendations:  Upper abdominal pain, generalized abdominal bloating, GERD.  Currently treated with famotidine 20 mg at bedtime. Add pantoprazole 40 mg po qam. CBC, CMP, TSH, lipase today. Schedule CT AP. Scheduled EGD. The risks (including bleeding, perforation, infection, missed lesions, medication reactions and possible hospitalization or surgery if complications occur), benefits, and alternatives to endoscopy with possible biopsy and possible dilation were discussed with the patient and they consent to  proceed.   IBS-C. Currently treated with Senna S daily, MiraLAX twice daily prn and dicyclomine  20 mg twice daily. Increase dicyclomine to 20 mg po bid. Gas-X or similar product qid prn.    cc: Ailene Ards, NP [address] [place],  [place] [phone]

## 2021-10-09 NOTE — Patient Instructions (Signed)
We have sent the following medications to your pharmacy for you to pick up at your convenience: Pantoprazole and dicyclomine.  Stay on famotidine.  Your provider has requested that you go to the basement level for lab work before leaving today. Press "B" on the elevator. The lab is located at the first door on the left as you exit the elevator.   You have been scheduled for a CT scan of the abdomen and pelvis at [facility] ([address]---this is in the same building as [employer]).   You are scheduled on 10/17/21 at 7:45am. You should arrive 15 minutes prior to your appointment time for registration. Please follow the written instructions below on the day of your exam:  WARNING: IF YOU ARE ALLERGIC TO IODINE/X-RAY DYE, PLEASE NOTIFY RADIOLOGY IMMEDIATELY AT [phone]! YOU WILL BE GIVEN A 13 HOUR PREMEDICATION PREP.  1) Do not eat or drink anything after 3:45am (4 hours prior to your test) 2) You have been given 2 bottles of oral contrast to drink. The solution may taste better if refrigerated, but do NOT add ice or any other liquid to this solution. Shake well before drinking.    Drink 1 bottle of contrast @ 5:45am (2 hours prior to your exam)  Drink 1 bottle of contrast @ 6:45am (1 hour prior to your exam)  You may take any medications as prescribed with a small amount of water, if necessary. If you take any of the following medications: METFORMIN, GLUCOPHAGE, GLUCOVANCE, AVANDAMET, RIOMET, FORTAMET, Jaconita MET, JANUMET, GLUMETZA or METAGLIP, you MAY be asked to HOLD this medication 48 hours AFTER the exam.  The purpose of you drinking the oral contrast is to aid in the visualization of your intestinal tract. The contrast solution may cause some diarrhea. Depending on your individual set of symptoms, you may also receive an intravenous injection of x-ray contrast/dye. Plan on being at [facility] for 30 minutes or longer, depending on the type of exam  you are having performed.  This test typically takes 30-45 minutes to complete.  If you have any questions regarding your exam or if you need to reschedule, you may call the CT department at [phone] between the hours of 8:00 am and 5:00 pm, Monday-Friday.  ________________________________________________________________________ Dennis Bast have been scheduled for an endoscopy. Please follow written instructions given to you at your visit today. If you use inhalers (even only as needed), please bring them with you on the day of your procedure.   The [facility] providers would like to encourage you to use [facility] to communicate with providers for non-urgent requests or questions.  Due to long hold times on the telephone, sending your provider a message by [facility] may be a faster and more efficient way to get a response.  Please allow 48 business hours for a response.  Please remember that this is for non-urgent requests.    Due to recent changes in healthcare laws, you may see the results of your imaging and laboratory studies on MyChart before your provider has had a chance to review them.  We understand that in some cases there may be results that are confusing or concerning to you. Not all laboratory results come back in the same time frame and the provider may be waiting for multiple results in order to interpret others.  Please give [place] 48 hours in order for your provider to thoroughly review all the results before contacting the office for clarification of your results.

## 2021-10-09 NOTE — Patient Instructions (Signed)
Visit Information  Phone number for Pharmacist: [phone]   Goals Addressed             This Visit's Progress    Manage My Medicine       Timeframe:  Long-Range Goal Priority:  High Start Date:     03/17/21                        Expected End Date: 10/09/22               Follow Up Date Jan 2023   - call for medicine refill 2 or 3 days before it runs out - call if I am sick and can't take my medicine - keep a list of all the medicines I take; vitamins and herbals too  -Utilize [facility] for medication synchronization, packaging and delivery -Continue Tamoxifen 20 mg daily; contact oncologist if severe hot flashes occur -Try deep breathing exercises for hot flashes -Set up appointment for pneumonia vaccine    Why is this important?   These steps will help you keep on track with your medicines.   Notes:      Stop or Cut Down Tobacco Use       Timeframe:  Long-Range Goal Priority:  High Start Date:       03/17/21                      Expected End Date:    06/17/21                   Follow Up Date Jan 2023   - change or avoid triggers like smoky places, drinking alcohol and other smokers - cut down number of cigarettes by one-half - use Quit [employer] Now  -start Nicotine patch 21 mg x 6 weeks, then 14 mg x 2 weeks, then 7 mg x 2 weeks -Start Nicotine lozenge 2 mg PRN for breakthrough cravings (up to every 1-2 hrs while awake)   Why is this important?   To stop or cut down it is important to have support from a person or group of people who you can count on.  You will also need to think about the things that make you feel like smoking, then plan for how to handle them.    Notes:         Care Plan : [place]  Updates made by Charlton Haws, [place] since 10/09/2021 12:00 AM     Problem: Hypertension, Asthma, Depression, Anxiety, Tobacco use and Chronic pain, Breast cancer   Priority: High     Long-Range Goal: Disease management    Start Date: 03/17/2021  Expected End Date: 10/09/2022  This Visit's Progress: On track  Recent Progress: On track  Priority: High  Note:   Current Barriers:  Unable to independently monitor therapeutic efficacy Unable to achieve control of smoking cessation  Patient is worried about side effects  Pharmacist Clinical Goal(s):  Patient will achieve adherence to monitoring guidelines and medication adherence to achieve therapeutic efficacy adhere to plan to optimize therapeutic regimen for smoking cessation as evidenced by report of adherence to recommended medication management changes through collaboration with PharmD and provider.  Discuss side effect concerns with providers  Interventions: 1:1 collaboration with Hoyt Koch, MD regarding development and update of comprehensive plan of care as evidenced by provider attestation and co-signature Inter-disciplinary care team collaboration (see longitudinal plan of care) Comprehensive  medication review performed; medication list updated in electronic medical record  Hypertension (BP goal <130/80) -Controlled w/o medications. Pt does endorse more frequent palpitations/"heart racing" since stopping diltiazem but these seem to be related to anxiety/anger -Medications previously tried: losartan, diltiazem,  -Current home readings: SBP 120-140; HR 70-90 -Denies hypotensive/hypertensive symptoms -Educated on BP goals and benefits of medications for prevention of heart attack, stroke and kidney damage; Importance of home blood pressure monitoring; -Counseled to monitor BP at home daily, document, and provide log at future appointments  Asthma (Goal: control symptoms and prevent exacerbations) -Controlled - pt reports breathing/SOB is controlled currently; she is using Flovent daily during allergy season; she does not always use it during other periods of the year; she does endorse palpitations sometimes after using albuterol nebulizer;  she requests replacement for nebulizer mask/tubing -Current treatment  Fluticasone (Flovent) 110 mcg/act 2 puffs BID Albuterol HFA prn - 1 time a day Albuterol 0.083% nebulizer - infrequent use.  Montelukast 10 mg HS Loratadine 10 mg daily  Fluticasone nasal spray daily  -Patient reports consistent use of maintenance inhaler -Frequency of rescue inhaler use: once a day during allergy season, infrequent otherwise -Counseled that frequent use of albuterol can lead to palpitations as well; advised her to reserve nebulizer for flares -Pt would like nebulizer supplies sent to [facility] or [facility], whichever takes her insurance. Coordinating with pharmacy assistants to order supplies.  Depression/Anxiety (Goal: manage symptoms) -Improving - pt reports prayer and meditation has helped significantly. She had several deaths in her family over the last few months, and many living family do not get along which causes more stress; pt is interested in counseling, she is now speaking with LCSW regularly and thinks it is helping -Current treatment: Escitalopram 20 mg daily Buspirone 7.5 mg BID -Medications previously tried/failed: sertraline -Educated on physical symptoms of anxiety; based on patient's descriptions of when palpitations and "shakes" occur, most likely explanation is anxiety/stress and not medications -Recommend to continue current medication and continue meditation/prayer  Tobacco use (Goal: cessation) -Uncontrolled -pt smokes 1 ppd, 18 pack-year history -Previous quit attempts: pt has quit several times cold [place]; always restarted due to stressful life events or being around other people who smoke; she has tried NRT patches before and suffered vivid dreams/nightmares while wearing them overnight -Patient triggers include: anxiety and watching television and driving  -Pt agreed to try NRT with goal of complete smoking cessation -Counseled on patch placement, side  effects, and option to remove at night if they experience trouble sleeping or bad dreams.  Counseled to allow lozenge to dissolve and absorb in cheek pocket, rather than swallow, to reduce GI side effects. -Given [place]-Quitline information and advised to call them for free nicotine patches/lozenges Plan:  -start Nicotine patch 21 mg x 6 weeks, then 14 mg x 2 weeks, then 7 mg x 2 weeks -Start Nicotine lozenge 2 mg PRN for breakthrough cravings (up to every 1-2 hrs while awake)  Chronic pain (Goal: manage pain) -Controlled/improving - pt has started PT for back pain; overall pain is improving since back surgery -follows with Dr [name], [name]. S/p back surgery 10/2020 -pt feels she has been able to cut back on oxycodone use since back surgery; she alternates between muscle relaxers and tries to avoid using 2 different ones in the same day -pt feels sluggish when getting up, low energy overall. Has been drinking V8 energy and Boost protein drinks to try to help.  -Current treatment  Oxycodone  10 mg q6h PRN (#120/month) - pt takes anywhere from 2-4 tabs per day Pregabalin 150 mg BID Tizanidine 4 mg PRN Cyclobenzaprine 10 mg PRN Metaxalone PRN Voltaren 1% gel Naloxone 4 mg nasal spray PRN Senna-S daily  -Patient is satisfied with current regimen and denies issues -Counseled to avoid taking more medication than prescribed due to risk for oversedation and respiratory depression  Breast cancer (Goal: prevent recurrence) -Improving - pt has started taking tamoxifen after long discussion about risks/benefits with multiple providers; she is experiencing some hot flashes, but she reports she was experiencing hot flashes before anyway, right now it is tolerable -Hot flashes worse at night/early AM; she does endorse hot flashes have worsened since taking prednisone recently for URI -Follows with [name]. Dx Stage 1A dx 2020. S/p lumpectomy 09/2019 and s/p partial mastectomy 07/2020.  S/p XRT Jan-Feb 2022. -Current treatment  Tamoxifen 20 mg daily AM -Counseled on hot flash severity - if symptoms worsen/become intolerable, advised to contact oncologist for alternative -counseled SSRIs and pregabalin/gabapentin are first line for helping with hot flashes and she is already taking these; other pharmacologic options include oxybutyinin, clonidine - can consider these in future if needed -Counseled that prednisone certainly can make hot flashes worse, this should return to baseline after completing course; counseled on lifestyle management of hot flashes including avoiding triggers (smoking, alcohol, caffeine), deep breathing exercises -Continue current medication  Health Maintenance -Vaccine gaps: pneumococcal (PCV20 or PCV15 followed by PPSV23 1 year later) -Recommended patient set up appt for Prevnar 20, if available. If not PCV13 or PCV15 followed by PPSCV23 in 1 year is recommended.   Patient Goals/Self-Care Activities Patient will:  - take medications as prescribed -focus on medication adherence by pill box -check blood pressure daily -Call 1-800-QUIT-NOW ([place] Quit Line) for smoking cessation counseling and to get free nicotine patches/lozenges -Start Nicotine Patch 21 mg/hr - apply in AM and remove before bed -Use Nicotine lozenges for breakthrough cravings -Continue Tamoxifen 20 mg daily; contact oncologist if severe hot flashes  -Use deep breathing to help with hot flashes -Set up appointment for pneumonia vaccine      Patient verbalizes understanding of instructions provided today and agrees to view in [place].  Telephone follow up appointment with pharmacy team member scheduled for: 2 months  Charlene Brooke, PharmD, [place], CPP Clinical Pharmacist Practitioner [facility] [phone]

## 2021-10-16 DIAGNOSIS — Z9011 Acquired absence of right breast and nipple: Secondary | ICD-10-CM | POA: Diagnosis not present

## 2021-10-16 DIAGNOSIS — F17200 Nicotine dependence, unspecified, uncomplicated: Secondary | ICD-10-CM | POA: Diagnosis not present

## 2021-10-16 DIAGNOSIS — C50911 Malignant neoplasm of unspecified site of right female breast: Secondary | ICD-10-CM | POA: Diagnosis not present

## 2021-10-16 DIAGNOSIS — I10 Essential (primary) hypertension: Secondary | ICD-10-CM | POA: Diagnosis not present

## 2021-10-16 DIAGNOSIS — N6489 Other specified disorders of breast: Secondary | ICD-10-CM | POA: Diagnosis not present

## 2021-10-16 DIAGNOSIS — Z853 Personal history of malignant neoplasm of breast: Secondary | ICD-10-CM | POA: Diagnosis not present

## 2021-10-17 ENCOUNTER — Inpatient Hospital Stay: Admission: RE | Admit: 2021-10-17 | Payer: Medicare Other | Source: Ambulatory Visit

## 2021-10-17 DIAGNOSIS — R339 Retention of urine, unspecified: Secondary | ICD-10-CM | POA: Diagnosis not present

## 2021-10-17 DIAGNOSIS — I10 Essential (primary) hypertension: Secondary | ICD-10-CM | POA: Diagnosis not present

## 2021-10-17 DIAGNOSIS — N8110 Cystocele, unspecified: Secondary | ICD-10-CM | POA: Diagnosis not present

## 2021-10-18 DIAGNOSIS — I10 Essential (primary) hypertension: Secondary | ICD-10-CM

## 2021-10-18 DIAGNOSIS — C50919 Malignant neoplasm of unspecified site of unspecified female breast: Secondary | ICD-10-CM

## 2021-10-18 DIAGNOSIS — Z170 Estrogen receptor positive status [ER+]: Secondary | ICD-10-CM

## 2021-10-18 DIAGNOSIS — J4521 Mild intermittent asthma with (acute) exacerbation: Secondary | ICD-10-CM

## 2021-10-18 DIAGNOSIS — F418 Other specified anxiety disorders: Secondary | ICD-10-CM

## 2021-10-19 ENCOUNTER — Ambulatory Visit (HOSPITAL_COMMUNITY)
Admission: RE | Admit: 2021-10-19 | Discharge: 2021-10-19 | Disposition: A | Discharge status: Home / Self Care | Payer: Medicare Other | Source: Ambulatory Visit | Attending: Gastroenterology | Admitting: Gastroenterology

## 2021-10-19 ENCOUNTER — Other Ambulatory Visit: Payer: Self-pay

## 2021-10-19 DIAGNOSIS — K760 Fatty (change of) liver, not elsewhere classified: Secondary | ICD-10-CM | POA: Diagnosis not present

## 2021-10-19 DIAGNOSIS — K219 Gastro-esophageal reflux disease without esophagitis: Secondary | ICD-10-CM | POA: Diagnosis not present

## 2021-10-19 DIAGNOSIS — R109 Unspecified abdominal pain: Secondary | ICD-10-CM | POA: Diagnosis not present

## 2021-10-19 DIAGNOSIS — R140 Abdominal distension (gaseous): Secondary | ICD-10-CM | POA: Insufficient documentation

## 2021-10-19 DIAGNOSIS — R1010 Upper abdominal pain, unspecified: Secondary | ICD-10-CM | POA: Diagnosis not present

## 2021-10-19 MED ORDER — IOHEXOL 350 MG/ML SOLN
80.0000 mL | Freq: Once | INTRAVENOUS | Status: AC | PRN
  Administered 2021-10-19: 80 mL via INTRAVENOUS

## 2021-11-06 ENCOUNTER — Ambulatory Visit (INDEPENDENT_AMBULATORY_CARE_PROVIDER_SITE_OTHER): Payer: Medicare Other | Admitting: *Deleted

## 2021-11-06 DIAGNOSIS — F418 Other specified anxiety disorders: Secondary | ICD-10-CM

## 2021-11-06 DIAGNOSIS — N390 Urinary tract infection, site not specified: Secondary | ICD-10-CM

## 2021-11-06 DIAGNOSIS — M545 Low back pain, unspecified: Secondary | ICD-10-CM

## 2021-11-06 NOTE — Patient Instructions (Signed)
Visit Information  Thank you for taking time to visit with me today. Please don't hesitate to contact me if I can be of assistance to you before our next scheduled telephone appointment.  Following are the goals we discussed today:  (Copy and paste patient goals from clinical care plan here)  Our next appointment is by telephone on 11/28/21 at 2pm  Please call the care guide team at [phone] if you need to cancel or reschedule your appointment.   If you are experiencing a Mental Health or [place] or need someone to talk to, please call the Suicide and Crisis Lifeline: 988 call the [place] National Suicide Prevention Lifeline: (419)097-3903 or TTY: 858-208-0303 TTY 8627245370) to talk to a trained counselor call 1-800-273-TALK (toll free, 24 hour hotline) go to [facility] [employer] [phone]) call 911   The patient verbalized understanding of instructions, educational materials, and care plan provided today and declined offer to receive copy of patient instructions, educational materials, and care plan.  Eduard Clos MSW, LCSW Licensed Clinical Social Worker [facility] [phone]
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
## 2021-11-18 DIAGNOSIS — F418 Other specified anxiety disorders: Secondary | ICD-10-CM | POA: Diagnosis not present

## 2021-11-21 ENCOUNTER — Ambulatory Visit (INDEPENDENT_AMBULATORY_CARE_PROVIDER_SITE_OTHER): Payer: Medicare Other | Admitting: *Deleted

## 2021-11-21 DIAGNOSIS — F418 Other specified anxiety disorders: Secondary | ICD-10-CM

## 2021-11-21 DIAGNOSIS — J4521 Mild intermittent asthma with (acute) exacerbation: Secondary | ICD-10-CM

## 2021-11-22 ENCOUNTER — Other Ambulatory Visit: Payer: Self-pay

## 2021-11-22 ENCOUNTER — Ambulatory Visit (AMBULATORY_SURGERY_CENTER): Payer: Medicare Other | Admitting: Gastroenterology

## 2021-11-22 ENCOUNTER — Encounter: Payer: Self-pay | Admitting: Gastroenterology

## 2021-11-22 VITALS — BP 175/97 | HR 78 | Temp 98.4°F | Resp 18 | Ht 59.0 in | Wt 185.0 lb

## 2021-11-22 DIAGNOSIS — R109 Unspecified abdominal pain: Secondary | ICD-10-CM | POA: Diagnosis not present

## 2021-11-22 DIAGNOSIS — K319 Disease of stomach and duodenum, unspecified: Secondary | ICD-10-CM

## 2021-11-22 DIAGNOSIS — K2970 Gastritis, unspecified, without bleeding: Secondary | ICD-10-CM

## 2021-11-22 DIAGNOSIS — K2950 Unspecified chronic gastritis without bleeding: Secondary | ICD-10-CM | POA: Diagnosis not present

## 2021-11-22 DIAGNOSIS — R1010 Upper abdominal pain, unspecified: Secondary | ICD-10-CM | POA: Diagnosis not present

## 2021-11-22 MED ORDER — SODIUM CHLORIDE 0.9 % IV SOLN: 500.0000 mL | Freq: Once | INTRAVENOUS | Status: DC

## 2021-11-22 NOTE — Patient Instructions (Addendum)
Information on Gastritis provided.  Await pathology results.   Resume all previous medications.  Resume previous diet   YOU HAD AN ENDOSCOPIC PROCEDURE TODAY AT [facility]:   Refer to the procedure report that was given to you for any specific questions about what was found during the examination.  If the procedure report does not answer your questions, please call your gastroenterologist to clarify.  If you requested that your care partner not be given the details of your procedure findings, then the procedure report has been included in a sealed envelope for you to review at your convenience later.  YOU SHOULD EXPECT: Some feelings of bloating in the abdomen. Passage of more gas than usual.  Walking can help get rid of the air that was put into your GI tract during the procedure and reduce the bloating. If you had a lower endoscopy (such as a colonoscopy or flexible sigmoidoscopy) you may notice spotting of blood in your stool or on the toilet paper. If you underwent a bowel prep for your procedure, you may not have a normal bowel movement for a few days.  Please Note:  You might notice some irritation and congestion in your nose or some drainage.  This is from the oxygen used during your procedure.  There is no need for concern and it should clear up in a day or so.  SYMPTOMS TO REPORT IMMEDIATELY:  Following upper endoscopy (EGD)  Vomiting of blood or coffee ground material  New chest pain or pain under the shoulder blades  Painful or persistently difficult swallowing  New shortness of breath  Fever of 100F or higher  Black, tarry-looking stools  For urgent or emergent issues, a gastroenterologist can be reached at any hour by calling [phone]. Do not use MyChart messaging for urgent concerns.    DIET:  We do recommend a small meal at first, but then you may proceed to your regular diet.  Drink plenty of fluids but you should avoid alcoholic beverages for 24  hours.  ACTIVITY:  You should plan to take it easy for the rest of today and you should NOT DRIVE or use heavy machinery until tomorrow (because of the sedation medicines used during the test).    FOLLOW UP: Our staff will call the number listed on your records 48-72 hours following your procedure to check on you and address any questions or concerns that you may have regarding the information given to you following your procedure. If we do not reach you, we will leave a message.  We will attempt to reach you two times.  During this call, we will ask if you have developed any symptoms of COVID 19. If you develop any symptoms (ie: fever, flu-like symptoms, shortness of breath, cough etc.) before then, please call [phone].  If you test positive for Covid 19 in the 2 weeks post procedure, please call and report this information to [place].    If any biopsies were taken you will be contacted by phone or by letter within the next 1-3 weeks.  Please call us at [phone] if you have not heard about the biopsies in 3 weeks.    SIGNATURES/CONFIDENTIALITY: You and/or your care partner have signed paperwork which will be entered into your electronic medical record.  These signatures attest to the fact that that the information above on your After Visit Summary has been reviewed and is understood.  Full responsibility of the confidentiality of this discharge information lies  with you and/or your care-partner.

## 2021-11-22 NOTE — Progress Notes (Signed)
See 11/17/2021 H&P, no changes.

## 2021-11-22 NOTE — Patient Instructions (Addendum)
Visit Information  Thank you for taking time to visit with me today. Please don't hesitate to contact me if I can be of assistance to you before our next scheduled telephone appointment.  Following are the goals we discussed today:  avoid negative self-talk -call [facility] to request assistance with in-network  Mental health appointments [phone] -call 911 or Crisis Line if worsening feelings surface - develop a personal safety plan - develop a plan to deal with triggers like holidays, anniversaries - have a plan for how to handle bad days - journal feelings and what helps to feel better or worse - spend time or talk with others at least 2 to 3 times per week - spend time or talk with others every day - watch for early signs of feeling worse - begin personal counseling - call and visit an old friend - join a support group - laugh; watch a funny movie or comedian - practice relaxation or meditation daily - start or continue a personal journal - practice positive thinking and self-talk I have placed a referral with [facility] to assist with connecting you with a mental health provider. they will contact you once a provider is located.    Our next appointment is by telephone on 12/19/21 at 10:30  Please call the care guide team at [phone] if you need to cancel or reschedule your appointment.   If you are experiencing a Mental Health or [place] or need someone to talk to, please call the Suicide and Crisis Lifeline: 988 call the [place] National Suicide Prevention Lifeline: 564-863-1235 or TTY: 815-278-4809 TTY (347)347-6751) to talk to a trained counselor call 1-800-273-TALK (toll free, 24 hour hotline) go to [facility] [address], [place] [phone]) call 911   Patient verbalizes understanding of instructions provided today and agrees to view in [place].   Eduard Clos MSW, LCSW Licensed Clinical Social  Worker [facility] [place] [phone]

## 2021-11-22 NOTE — Op Note (Signed)
[facility] Patient Name: Emily [name] Procedure Date: 11/22/2021 10:36 AM MRN: [id number] Endoscopist: Ladene Artist , MD Age: 51 Referring MD:  Date of Birth: 1971-03-04 Gender: Female Account #: [id number] Procedure:                Upper GI endoscopy Indications:              Upper abdominal pain Medicines:                Monitored Anesthesia Care Procedure:                Pre-Anesthesia Assessment:                           - Prior to the procedure, a History and Physical                            was performed, and patient medications and                            allergies were reviewed. The patient's tolerance of                            previous anesthesia was also reviewed. The risks                            and benefits of the procedure and the sedation                            options and risks were discussed with the patient.                            All questions were answered, and informed consent                            was obtained. Prior Anticoagulants: The patient has                            taken no previous anticoagulant or antiplatelet                            agents. ASA Grade Assessment: III - A patient with                            severe systemic disease. After reviewing the risks                            and benefits, the patient was deemed in                            satisfactory condition to undergo the procedure.                           After obtaining informed consent, the endoscope was  passed under direct vision. Throughout the                            procedure, the patient's blood pressure, pulse, and                            oxygen saturations were monitored continuously. The                            GIF [id number] #[id number] was introduced through the                            mouth, and advanced to the second part of duodenum.                            The upper GI endoscopy was  accomplished without                            difficulty. The patient tolerated the procedure                            well. Scope In: Scope Out: Findings:                 The esophagus was normal.                           Patchy mildly erythematous mucosa without bleeding                            was found in the gastric body and in the gastric                            antrum. Biopsies were taken with a cold forceps for                            histology.                           The exam of the stomach was otherwise normal.                           The examined duodenum was normal.                           The cardia and gastric fundus were normal on                            retroflexion. Complications:            No immediate complications. Estimated Blood Loss:     Estimated blood loss: none. Impression:               - Normal esophagus.                           - Erythematous mucosa in the gastric  body and                            antrum. Biopsied.                           - Normal examined duodenum. Recommendation:           - Patient has a contact number available for                            emergencies. The signs and symptoms of potential                            delayed complications were discussed with the                            patient. Return to normal activities tomorrow.                            Written discharge instructions were provided to the                            patient.                           - Resume previous diet.                           - Continue present medications.                           - Await pathology results. Ladene Artist, MD 11/22/2021 10:47:53 AM This report has been signed electronically.
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
## 2021-11-22 NOTE — Progress Notes (Signed)
PT taken to PACU. Monitors in place. VSS. Report given to RN. 

## 2021-11-22 NOTE — Progress Notes (Signed)
Called to room to assist during endoscopic procedure.  Patient ID and intended procedure confirmed with present staff. Received instructions for my participation in the procedure from the performing physician.  

## 2021-11-22 NOTE — Progress Notes (Signed)
Pt's states no medical or surgical changes since previsit or office visit. VS assessed by C.W 

## 2021-11-24 ENCOUNTER — Telehealth: Payer: Self-pay | Admitting: *Deleted

## 2021-11-24 NOTE — Telephone Encounter (Signed)
°  Follow up Call-  Call back number 11/22/2021  Post procedure Call Back phone  # [phone]  Permission to leave phone message Yes  Some recent data might be hidden     Patient questions:  Do you have a fever, pain , or abdominal swelling? No. Pain Score  0 *  Have you tolerated food without any problems? Yes.    Have you been able to return to your normal activities? Yes.    Do you have any questions about your discharge instructions: Diet   No. Medications  No. Follow up visit  No.  Do you have questions or concerns about your Care? no  Actions: * If pain score is 4 or above: No action needed, pain <4.  Have you developed a fever since your procedure? no  2.   Have you had an respiratory symptoms (SOB or cough) since your procedure? no  3.   Have you tested positive for COVID 19 since your procedure no  4.   Have you had any family members/close contacts diagnosed with the COVID 19 since your procedure?  no   If yes to any of these questions please route to Joylene John, RN and Joella Prince, RN

## 2021-11-29 DIAGNOSIS — H5213 Myopia, bilateral: Secondary | ICD-10-CM | POA: Diagnosis not present

## 2021-12-05 ENCOUNTER — Encounter: Payer: Self-pay | Admitting: Gastroenterology

## 2021-12-05 ENCOUNTER — Other Ambulatory Visit: Payer: Self-pay | Admitting: Internal Medicine

## 2021-12-05 DIAGNOSIS — J4520 Mild intermittent asthma, uncomplicated: Secondary | ICD-10-CM

## 2021-12-05 DIAGNOSIS — J301 Allergic rhinitis due to pollen: Secondary | ICD-10-CM

## 2021-12-05 DIAGNOSIS — F418 Other specified anxiety disorders: Secondary | ICD-10-CM

## 2021-12-05 DIAGNOSIS — K219 Gastro-esophageal reflux disease without esophagitis: Secondary | ICD-10-CM

## 2021-12-06 ENCOUNTER — Ambulatory Visit: Payer: Medicare Other

## 2021-12-06 DIAGNOSIS — J4521 Mild intermittent asthma with (acute) exacerbation: Secondary | ICD-10-CM

## 2021-12-06 DIAGNOSIS — F418 Other specified anxiety disorders: Secondary | ICD-10-CM

## 2021-12-06 DIAGNOSIS — I10 Essential (primary) hypertension: Secondary | ICD-10-CM

## 2021-12-06 DIAGNOSIS — J301 Allergic rhinitis due to pollen: Secondary | ICD-10-CM

## 2021-12-06 NOTE — Patient Instructions (Signed)
Visit Information  Following are the goals we discussed today:   Manage My Medicine   Timeframe:  Long-Range Goal Priority:  High Start Date:     03/17/21                        Expected End Date: 10/09/22               Follow Up Date April 2023   - call for medicine refill 2 or 3 days before it runs out - call if I am sick and can't take my medicine - keep a list of all the medicines I take; vitamins and herbals too  -Utilize [facility] for medication synchronization, packaging and delivery -Continue Tamoxifen 20 mg daily; contact oncologist if severe hot flashes occur -Start Nicotine patches daily and nicotine lozenges as needed for smoking cessation     Why is this important?   These steps will help you keep on track with your medicines.  Plan: Telephone follow up appointment with care management team member scheduled for:  3 months  The patient has been provided with contact information for the care management team and has been advised to call with any health related questions or concerns.   Tomasa Blase, PharmD Clinical Pharmacist, [facility]    Please call the care guide team at [phone] if you need to cancel or reschedule your appointment.   Patient verbalizes understanding of instructions and care plan provided today and agrees to view in [place]. Active MyChart status confirmed with patient.

## 2021-12-06 NOTE — Progress Notes (Signed)
Chronic Care Management Pharmacy Note  12/06/2021 Name:  Emily [name] MRN:  [id number] DOB:  07/08/1971  Summary: -Patient reports that she has not started nicotine patches or lozenges - but is now more motivated to stop smoking again -Was going to call [facility] to set up an appointment but had lost their number -Denies issues with current medications  Recommendations/Changes made from today's visit: -Patient was given number to [place] to assist with connecting to mental health provider  -Advised calling [place] Quitline for Nicotine 21 mg/hr patch, apply in AM and remove at HS with nicotine lozenges PRN for cravings  Subjective: Emily [name] is an 51 y.o. year old female who is a primary patient of Hoyt Koch, MD.  The CCM team was consulted for assistance with disease management and care coordination needs.    Engaged with patient by telephone for follow up visit in response to provider referral for pharmacy case management and/or care coordination services.   Consent to Services:  The patient was given information about Chronic Care Management services, agreed to services, and gave verbal consent prior to initiation of services.  Please see initial visit note for detailed documentation.   Patient Care Team: Hoyt Koch, MD as PCP - General (Internal Medicine) Zonia Kief, MD (Rehabilitation) Renato Shin, MD (Endocrinology) Brien Few, MD (Obstetrics and Gynecology) Laray Anger, DO (Anesthesiology) Erroll Luna, MD as Consulting Physician (General Surgery) Charlton Haws, [facility] as Pharmacist (Pharmacist) Deirdre Peer, LCSW as Social Worker (Licensed Clinical Social Worker)  Patient lived in [place] as a child, has lived in [place] and [place] as well. Pt was married for 15 years, now divorced. She has been in [place] for 13 years. She lives with SO and her daughter lives down the street. She is on disability  due to work injury.   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 Asa Lente - Pain Medicine - continue current medications - follow up in 2 months  10/17/2021 - Dr. Willa Rough - OB/GYN - no changes to medications  10/16/2021 - Dr. Jackson Latino - Heme/Onc - no changes to medications  10/16/2021 - Dr. Fuller Plan Gertie Fey - abdominal pain - CT scheduled - no changes to medications - follow up after results of CT  10/09/2021 - Dr. Jackson Latino - Heme/Onc - no changes to medications   Hospital visits: None in previous 6 months  Objective:  Lab Results  Component Value Date   CREATININE 0.83 10/09/2021   BUN 6 10/09/2021   GFR 82.19 10/09/2021   GFRNONAA >60 09/18/2019   GFRAA >60 09/18/2019   NA 141 10/09/2021   K 4.4 10/09/2021   CALCIUM 9.8 10/09/2021   CO2 32 10/09/2021   GLUCOSE 100 (H) 10/09/2021    Lab Results  Component Value Date/Time   HGBA1C 5.5 07/18/2020 12:25 PM   HGBA1C 5.4 06/17/2018 11:28 AM   GFR 82.19 10/09/2021 03:10 PM   GFR 96.32 07/18/2020 12:25 PM    Last diabetic Eye exam: No results found for: HMDIABEYEEXA  Last diabetic Foot exam: No results found for: HMDIABFOOTEX   Lab Results  Component Value Date   CHOL 163 07/18/2020   HDL 24.40 (L) 07/18/2020   LDLCALC 110 (H) 07/18/2020   LDLDIRECT 74.0  06/14/2015   TRIG 146.0 07/18/2020   CHOLHDL 7 07/18/2020    Hepatic Function Latest Ref Rng & Units 10/09/2021 07/18/2020 09/18/2019  Total Protein 6.0 - 8.3 g/dL 7.8 7.8 7.1  Albumin 3.5 - 5.2 g/dL 4.4 4.4 3.8  AST 0 - 37 U/L _0 ALT 0 - 35 U/L _1 Alk Phosphatase 39 - 117 U/L 92 101 84  Total Bilirubin 0.2 - 1.2 mg/dL 0.5 0.6 0.6  Bilirubin, Direct 0.0 - 0.3 mg/dL - 0.1 -    Lab Results  Component Value Date/Time   TSH 0.46 10/09/2021 03:10  PM   TSH 0.46 07/18/2020 12:25 PM   FREET4 0.85 06/27/2016 11:05 AM   FREET4 0.66 12/16/2015 10:30 AM    CBC Latest Ref Rng & Units 10/09/2021 07/18/2020 09/18/2019  WBC 4.0 - 10.5 K/uL 7.2 10.5 9.5  Hemoglobin 12.0 - 15.0 g/dL 14.1 14.0 13.3  Hematocrit 36.0 - 46.0 % 42.7 41.3 38.9  Platelets 150.0 - 400.0 K/uL 212.0 280.0 254    No results found for: VD25OH  Clinical ASCVD: No  The 10-year ASCVD risk score (Arnett DK, et al., 2019) is: 29.8%   Values used to calculate the score:     Age: 49 years     Sex: Female     Is Non-Hispanic African American: Yes     Diabetic: No     Tobacco smoker: Yes     Systolic Blood Pressure: 127 mmHg     Is BP treated: No     HDL Cholesterol: 24.4 mg/dL     Total Cholesterol: 163 mg/dL    Depression screen [facility] 2/9 11/06/2021 06/21/2021 05/18/2021  Decreased Interest _2 Down, Depressed, Hopeless 0 0 1  PHQ - 2 Score _3 Altered sleeping - 2 0  Tired, decreased energy - 3 1  Change in appetite - 1 1  Feeling bad or failure about yourself  - 0 0  Trouble concentrating - 0 1  Moving slowly or fidgety/restless - 0 0  Suicidal thoughts - 0 0  PHQ-9 Score - 9 6  Difficult doing work/chores - Somewhat difficult -      Social History   Tobacco Use  Smoking Status Every Day   Packs/day: 1.00   Years: 18.00   Pack years: 18.00   Types: Cigarettes  Smokeless Tobacco Never  Tobacco Comments   starting NRT patches/lozenges 03/2021   BP Readings from Last 3 Encounters:  11/22/21 (!) 175/97  10/09/21 100/70  09/12/21 128/80   Pulse Readings from Last 3 Encounters:  11/22/21 78  10/09/21 88  09/12/21 100   Wt Readings from Last 3 Encounters:  11/22/21 185 lb (83.9 kg)  10/09/21 185 lb 8 oz (84.1 kg)  09/12/21 185 lb 9.6 oz (84.2 kg)   BMI Readings from Last 3 Encounters:  11/22/21 37.37 kg/m  10/09/21 36.53 kg/m  09/12/21 37.49 kg/m    Assessment/Interventions: Review of patient past medical history, allergies,  medications, health status, including review of consultants reports, laboratory and other test data, was performed as part of comprehensive evaluation and provision of chronic care management services.   SDOH:  (Social Determinants of Health) assessments and interventions performed: Yes  SDOH Screenings   Alcohol Screen: Low Risk    Last Alcohol Screening Score (AUDIT): 0  Depression (PHQ2-9): Low Risk    PHQ-2 Score: 2  Financial Resource Strain: Low Risk    Difficulty of Paying Living Expenses:  Not hard at all  Food Insecurity: No Food Insecurity   Worried About Charity fundraiser in the Last Year: Never true   Ran Out of Food in the Last Year: Never true  Housing: Low Risk    Last Housing Risk Score: 0  Physical Activity: Inactive   Days of Exercise per Week: 0 days   Minutes of Exercise per Session: 0 min  Social Connections: Engineer, building services of Communication with Friends and Family: More than three times a week   Frequency of Social Gatherings with Friends and Family: Once a week   Attends Religious Services: 1 to 4 times per year   Active Member of [employer] or Organizations: No   Attends Archivist Meetings: 1 to 4 times per year   Marital Status: Living with partner  Stress: Stress Concern Present   Feeling of Stress : To some extent  Tobacco Use: High Risk   Smoking Tobacco Use: Every Day   Smokeless Tobacco Use: Never   Passive Exposure: Not on file  Transportation Needs: No Transportation Needs   Lack of Transportation (Medical): No   Lack of Transportation (Non-Medical): No    CCM Care Plan  Allergies  Allergen Reactions   Other Hives    Dermabond   Wound Dressing Adhesive Rash   Morphine Other (See Comments)    Drops BP    Morphine And Related     Lowers BP too much   Tape Dermatitis    Medications Reviewed Today     Reviewed by Tomasa Blase, [facility] (Pharmacist) on 12/06/21 at 1121  Med List Status: <None>   Medication Order  Taking? Sig Documenting Provider Last Dose Status Informant  0.9 %  sodium chloride infusion [id number]   Ladene Artist, MD  Active   albuterol (PROVENTIL) (2.5 MG/3ML) 0.083% nebulizer solution [id number] Yes Take 3 mLs (2.5 mg total) by nebulization every 4 (four) hours as needed for wheezing or shortness of breath. Hoyt Koch, MD Taking Active   ALLERGY RELIEF 10 MG tablet [id number] Yes TAKE ONE TABLET BY MOUTH EVERYDAY AT BEDTIME Hoyt Koch, MD Taking Active   busPIRone (BUSPAR) 7.5 MG tablet [id number] Yes TAKE ONE TABLET BY MOUTH EVERY MORNING and TAKE ONE TABLET BY MOUTH EVERYDAY AT BEDTIME Hoyt Koch, MD Taking Active   Calcium Carbonate-Simethicone 750-80 MG CHEW [id number] Yes Chew 1 tablet by mouth 3 times/day as needed-between meals & bedtime. [provider] Taking Active   cyclobenzaprine (FLEXERIL) 10 MG tablet [id number] Yes Take 10 mg by mouth daily as needed for muscle spasms. [provider] Taking Active   diclofenac Sodium (VOLTAREN) 1 % GEL [id number] Yes SMARTSIG:2 Gram(s) Topical 4 Times Daily PRN [provider] Taking Active   dicyclomine (BENTYL) 20 MG tablet [id number] Yes Take 1 tablet (20 mg total) by mouth in the morning, at noon, in the evening, and at bedtime. Ladene Artist, MD Taking Active   escitalopram (LEXAPRO) 20 MG tablet [id number] Yes Take 1 tablet (20 mg total) by mouth daily. Hoyt Koch, MD Taking Active   fluticasone [facility]) 50 MCG/ACT nasal spray [id number] Yes Place 2 sprays into both nostrils daily. Hoyt Koch, MD Taking Active   fluticasone [facility] HFA) 110 MCG/ACT inhaler [id number] Yes TAKE 2 PUFFS BY MOUTH TWICE A DAY Hoyt Koch, MD Taking Active   methocarbamol (ROBAXIN) 750 MG tablet [id number] Yes Take 750 mg by mouth every 8 (eight) hours as needed  for muscle spasms. [provider] Taking Active   montelukast (SINGULAIR) 10 MG tablet [id number] Yes  TAKE ONE TABLET BY MOUTH EVERYDAY AT BEDTIME Hoyt Koch, MD Taking Active   Multiple Vitamins-Minerals [facility] IMMUNE PLUS/VIT D PO) [id number] Yes Take 3 capsules by mouth daily. [provider] Taking Active   nicotine (NICOTINE STEP 1) 21 mg/24hr patch [id number] No Place 1 patch (21 mg total) onto the skin daily. Remove patch 30 min before bedtime  Patient not taking: Reported on 11/22/2021   Hoyt Koch, MD Not Taking Active   nicotine polacrilex (COMMIT) 2 MG lozenge [id number] No Take 1 lozenge (2 mg total) by mouth as needed (every 1-2 hours for cravings).  Patient not taking: Reported on 11/22/2021   Hoyt Koch, MD Not Taking Active   Olopatadine HCl 0.2 % SOLN [id number] Yes Apply 1-2 drops to eye 2 (two) times daily as needed (itching, running eyes). Hoyt Koch, MD Taking Active   ondansetron (ZOFRAN-ODT) 4 MG disintegrating tablet [id number] Yes Take 4 mg by mouth as needed. [provider] Taking Active   Oxycodone HCl 10 MG TABS [id number] Yes Take 10 mg by mouth every 6 (six) hours as needed. [provider] Taking Active   pantoprazole (PROTONIX) 40 MG tablet [id number] Yes Take 1 tablet (40 mg total) by mouth daily. Ladene Artist, MD Taking Active   polyethylene glycol powder [facility]) 17 GM/SCOOP powder [id number] Yes Take 17 g by mouth 2 (two) times daily as needed. Hoyt Koch, MD Taking Active   pregabalin (LYRICA) 150 MG capsule [id number] Yes Take 150 mg by mouth 2 (two) times daily. [provider] Taking Active   PROAIR HFA [phone] Base) MCG/ACT inhaler [id number] Yes Inhale 1-2 puffs into the lungs every 6 (six) hours as needed for wheezing or shortness of breath. Hoyt Koch, MD Taking Active   PROCTO-MED [facility] 2.5 % rectal cream [id number] Yes place RECTALLY TWICE DAILY Hoyt Koch, MD Taking Active   senna-docusate (SENNA-S) 8.6-50 MG tablet [id number] Yes Take 1  tablet by mouth daily. Hoyt Koch, MD Taking Active   silver sulfADIAZINE (SILVADENE) 1 % cream [id number] No Apply 1 application topically daily.  Patient not taking: Reported on 11/22/2021   Hoyt Koch, MD Not Taking Active   tamoxifen (NOLVADEX) 20 MG tablet [id number] Yes Take 1 tablet (20 mg total) by mouth daily. Magrinat, Virgie Dad, MD Taking Active   tiZANidine (ZANAFLEX) 4 MG tablet [id number] Yes Take 4 mg by mouth as needed. 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 09/24/2019   Anxiety state 05/06/2017   Iron deficiency anemia 12/16/2015   Hemorrhoids 06/14/2015   Trigger index finger of right hand 05/17/2015   Encounter for general adult medical examination with abnormal findings 05/12/2015   Bilateral carpal tunnel syndrome 03/29/2015   Left thyroid nodule    Back pain, lumbosacral    Depression with anxiety 03/22/2010   MIGRAINE HEADACHE 03/22/2010   Essential hypertension 03/22/2010   Allergic rhinitis 03/22/2010   Asthma 03/22/2010   GERD 03/22/2010   ARTHRITIS,  SHOULDER 03/22/2010   Major depressive disorder, single episode 03/22/2010    Immunization History  Administered Date(s) Administered   Influenza,inj,Quad PF,6+ Mos 11/04/2013, 12/16/2015, 08/22/2016, 01/01/2018, 07/29/2018, 08/10/2019, 07/18/2020   Moderna Sars-Covid-2 Vaccination 02/04/2020, 03/04/2020, 02/03/2021   PNEUMOCOCCAL CONJUGATE-20 07/17/2021   Td 03/22/2010   Tdap 07/18/2020    Conditions to be addressed/monitored:  Hypertension, Asthma, Depression, Anxiety, Tobacco use and Chronic pain , Breast cancer  Care Plan : [place]  Updates made by Tomasa Blase, RPH since 12/06/2021 12:00 AM     Problem: Hypertension, Asthma, Depression, Anxiety, Tobacco use and Chronic pain, Breast cancer   Priority: High     Long-Range Goal: Disease management   Start Date: 03/17/2021  Expected End Date: 10/09/2022  This Visit's Progress: On track  Recent Progress: On track  Priority: High  Note:   Current Barriers:  Unable to independently monitor therapeutic efficacy Unable to achieve control of smoking cessation  Patient is worried about side effects  Pharmacist Clinical Goal(s):  Patient will achieve adherence to monitoring guidelines and medication adherence to achieve therapeutic efficacy adhere to plan to optimize therapeutic regimen for smoking cessation as evidenced by report of adherence to recommended medication management changes through collaboration with PharmD and provider.  Discuss side effect concerns with providers  Interventions: 1:1 collaboration with Hoyt Koch, MD regarding development and update of comprehensive plan of care as evidenced by provider attestation and co-signature Inter-disciplinary care team collaboration (see longitudinal plan of care) Comprehensive medication review performed; medication list updated in electronic medical record  Hypertension (BP goal <130/80) -Controlled w/o medication at this time -Medications previously tried: losartan, diltiazem,  -Current home readings: SBP 120-140; HR 70-90 -Denies hypotensive/hypertensive symptoms -Educated on BP goals and benefits of medications for prevention of heart attack, stroke and kidney damage; Importance of home blood pressure monitoring; -Counseled to monitor BP at home daily, document, and provide log at future appointments  Asthma (Goal: control symptoms and prevent exacerbations) -Controlled - pt reports breathing/SOB is controlled currently; she is using Flovent prn; she does endorse  palpitations sometimes after using albuterol nebulizer -Current treatment  Fluticasone (Flovent) 110 mcg/act 2 puffs BID - patient taking prn at this time  Albuterol HFA prn - 1 time a day Albuterol 0.083% nebulizer - infrequent use.  Montelukast 10 mg HS Loratadine 10 mg daily  Fluticasone nasal spray daily  -Patient reports consistent use of maintenance inhaler -Frequency of rescue inhaler use: once a day  -Counseled to try using flovent BID rather than prn to help reduce need for albuterol inhaler as frequent use can cause her palpitations    Depression/Anxiety (Goal: manage symptoms) -Improving - pt reports prayer and meditation has helped significantly.pt is interested in counseling, she is now speaking with LCSW regularly and thinks it is helping -Current treatment: Escitalopram 20 mg daily Buspirone 7.5 mg BID -Medications previously tried/failed: sertraline -Educated on physical symptoms of anxiety; based on patient's descriptions of when  -Recommend to continue current medication and continue meditation/prayer, given number for [facility] to reach out help get established with a mental health provider   Tobacco use (Goal: cessation) -Uncontrolled -pt smokes 1 ppd, 18 pack-year history - patient today reports increased motivation to quit -Previous quit attempts: pt has quit several times cold [place]; always restarted due to stressful life events or being around other people who smoke; she has tried NRT patches before and suffered vivid dreams/nightmares while wearing them overnight -Patient triggers include: anxiety and watching  television and driving  -Pt agreed to try NRT with goal of complete smoking cessation -Counseled on patch placement, side effects, and option to remove at night if they experience trouble sleeping or bad dreams.  Counseled to allow lozenge to dissolve and absorb in cheek pocket, rather than swallow, to reduce GI side effects. -Given [place]-Quitline information  and advised to call them for free nicotine patches/lozenges Plan:  -start Nicotine patch 21 mg x 6 weeks, then 14 mg x 2 weeks, then 7 mg x 2 weeks -Start Nicotine lozenge 2 mg PRN for breakthrough cravings (up to every 1-2 hrs while awake)  Chronic pain (Goal: manage pain) -Controlled/improving - pt has started PT for back pain; overall pain is improving since back surgery -follows with Dr Asa Lente, [name]. S/p back surgery 10/2020 -pt feels she has been able to cut back on oxycodone use since back surgery; she alternates between muscle relaxers - states that she never takes two different muscle relaxers in the same day -pt feels sluggish when getting up, low energy overall. Has \  -Current treatment  Oxycodone 10 mg q6h PRN (#120/month) - pt takes anywhere from 2-4 tabs per day Pregabalin 150 mg BID Tizanidine 4 mg PRN Cyclobenzaprine 10 mg PRN Methocarbamol PRN Voltaren 1% gel Naloxone 4 mg nasal spray PRN Senna-S daily  -Patient is satisfied with current regimen and denies issues -Counseled to avoid taking more medication than prescribed due to risk for oversedation and respiratory depression  Breast cancer (Goal: prevent recurrence) -Improving - has been taking tamoxifen - tolerating  -Follows with [name]. Dx Stage 1A dx 2020. S/p lumpectomy 09/2019 and s/p partial mastectomy 07/2020. S/p XRT Jan-Feb 2022. -Current treatment  Tamoxifen 20 mg daily AM -Counseled on hot flash severity - if symptoms worsen/become intolerable, advised to contact oncologist for alternative -Continue current medication  Health Maintenance -Vaccine gaps: Shingles, COVID booster    Patient Goals/Self-Care Activities Patient will:  - take medications as prescribed -focus on medication adherence by pill box -check blood pressure daily -Call 1-800-QUIT-NOW ([place] Quit Line) for smoking cessation counseling and to get free nicotine patches/lozenges -Start Nicotine Patch 21 mg/hr  - apply in AM and remove before bed -Use Nicotine lozenges for breakthrough cravings     Medication Assistance: None required.  Patient affirms current coverage meets needs.  Care Gaps: Shingrix Covid booster (due 05/06/21) Mammogram    Patient's preferred pharmacy is:  [facility] - [place], [place] - [address] [place] [address] Phone: [phone] Fax: [phone]  [id number] [place] [id number]: [phone]: [phone]  Uses pill box? Yes Pt endorses 100% compliance   Patient decided to: Utilize [facility] for medication synchronization, packaging and delivery  Care Plan and Follow Up Patient Decision:  Patient agrees to Care Plan and Follow-up.  Plan: Telephone follow up appointment with care management team member scheduled for:  3 months

## 2021-12-11 DIAGNOSIS — C50911 Malignant neoplasm of unspecified site of right female breast: Secondary | ICD-10-CM | POA: Diagnosis not present

## 2021-12-11 DIAGNOSIS — Z853 Personal history of malignant neoplasm of breast: Secondary | ICD-10-CM | POA: Diagnosis not present

## 2021-12-13 DIAGNOSIS — Z853 Personal history of malignant neoplasm of breast: Secondary | ICD-10-CM | POA: Diagnosis not present

## 2021-12-13 DIAGNOSIS — C50911 Malignant neoplasm of unspecified site of right female breast: Secondary | ICD-10-CM | POA: Diagnosis not present

## 2021-12-14 DIAGNOSIS — Z853 Personal history of malignant neoplasm of breast: Secondary | ICD-10-CM | POA: Diagnosis not present

## 2021-12-14 DIAGNOSIS — C50911 Malignant neoplasm of unspecified site of right female breast: Secondary | ICD-10-CM | POA: Diagnosis not present

## 2021-12-19 ENCOUNTER — Ambulatory Visit: Payer: Medicare Other | Admitting: Licensed Clinical Social Worker

## 2021-12-19 DIAGNOSIS — F418 Other specified anxiety disorders: Secondary | ICD-10-CM | POA: Diagnosis not present

## 2021-12-19 DIAGNOSIS — I10 Essential (primary) hypertension: Secondary | ICD-10-CM

## 2021-12-19 DIAGNOSIS — J4521 Mild intermittent asthma with (acute) exacerbation: Secondary | ICD-10-CM

## 2021-12-19 DIAGNOSIS — F411 Generalized anxiety disorder: Secondary | ICD-10-CM

## 2021-12-19 NOTE — Chronic Care Management (AMB) (Signed)
° °   Clinical Social Work  Chronic Care Management   Phone Outreach    12/19/2021 Name: Emily [name] MRN: [id number] DOB: [birth year]  Emily [name] is a 51 y.o. year old female who is a primary care patient of Hoyt Koch, MD .   Reason for referral: Mental Health Counseling and Resources.    F/U phone call today to assess needs, progress and barriers with care plan goals.   Patient unable to keep phone appointment today and requested to reschedule.  Plan:Appointment was rescheduled with CCM LCSW on Feb 1st at 12:00  Review of patient status, including review of consultants reports, relevant laboratory and other test results, and collaboration with appropriate care team members and the patient's provider was performed as part of comprehensive patient evaluation and provision of care management services.    Emily Lanius, LCSW Licensed Clinical Social Worker [facility]  [place]  [phone]

## 2021-12-19 NOTE — Patient Instructions (Signed)
° ° ° °  I am sorry you were unable to keep your phone appointment today.   per your request your appointment is scheduled Feb. 1st at 12:00  Casimer Lanius, [place] Licensed Clinical Social Worker [facility]  [place]  [phone]

## 2021-12-20 ENCOUNTER — Ambulatory Visit (INDEPENDENT_AMBULATORY_CARE_PROVIDER_SITE_OTHER): Payer: Medicare Other | Admitting: Licensed Clinical Social Worker

## 2021-12-20 DIAGNOSIS — F418 Other specified anxiety disorders: Secondary | ICD-10-CM

## 2021-12-20 DIAGNOSIS — F411 Generalized anxiety disorder: Secondary | ICD-10-CM

## 2021-12-20 NOTE — Chronic Care Management (AMB) (Signed)
Chronic Care Management   Clinical Social Work Note  12/20/2021 Name: Emily [name] MRN: [id number] DOB: 04/11/1971  Emily [name] is a 51 y.o. year old female who is a primary care patient of Emily [name] Cons, MD. The CCM team was consulted to assist the patient with chronic disease management and/or care coordination needs related to: Mental Health Counseling and Resources.   Engaged with patient by telephone for follow up visit in response to provider referral for social work chronic care management and care coordination services.   Consent to Services:  The patient was given information about Chronic Care Management services, agreed to services, and gave verbal consent prior to initiation of services.  Please see initial visit note for detailed documentation.   Patient agreed to services and consent obtained.   Summary: Assessed patient's current treatment, progress, coping skills, support system and barriers to care.  She continues to experience difficulty with reaching out to [place], and is a little ambivalent. Which contributes to feeling overwhelmed with making the first step to start therapy..  See Care Plan below for interventions and patient self-care actives.  Recommendation: Patient may benefit from, and is in agreement for LCSW to provide brief interventions as well as explore and other therapy options.   Follow up Plan: Patient would like continued follow-up from CCM LCSW.  per patient's request will follow up in 2 weeks.  Will call office if needed prior to next encounter.   Assessment: Review of patient past medical history, allergies, medications, and health status, including review of relevant consultants reports was performed today as part of a comprehensive evaluation and provision of chronic care management and care coordination services.     SDOH (Social Determinants of Health) assessments and interventions performed:  SDOH Interventions     Flowsheet Row Most Recent Value  SDOH Interventions   SDOH Interventions for the Following Domains Stress  Stress Interventions Provide Counseling        Advanced Directives Status: Not addressed in this encounter.  CCM Care Plan  Conditions to be addressed/monitored: Anxiety and Depression;   Care Plan : LCSW Plan of Care  Updates made by [name], LCSW since 12/20/2021 12:00 AM     Problem: Mental Health Needs- Depression   Priority: High     Long-Range Goal: Develop Self Care Plan for Management of Depression   Start Date: 05/17/2021  Expected End Date: 03/18/2022  This Visit's Progress: On track  Recent Progress: Not on track  Priority: High  Note:   Current barriers:   Chronic Mental Health needs related to depression Mental Health Concerns  and Lacks knowledge of community resources Needs Support, Education, and Care Coordination in order to meet unmet mental health needs. Has not connected with [facility]   Clinical Goal(s): patient will work with SW to address concerns related to depression/mental health support needs    Interventions: 1:1 collaboration with primary care provider regarding development and update of comprehensive plan of care as evidenced by provider attestation and co-signature Inter-disciplinary care team collaboration (see longitudinal plan of care) Evaluation of current treatment plan related to  self management and patient's adherence to plan as established by provider Review resources, discussed options and provided patient information about  Additional mental health options  Mental Health:  (Status: Goal on Track (progressing): YES.) Evaluation of current treatment plan related to  symptoms of anxiety and depression Motivational Interviewing employed Solution-Focused Strategies employed:  Mindfulness or Relaxation training provided Active listening / Reflection  utilized  Problem Solving [place] strategies reviewed Provided  psychoeducation for mental health needs  Participation in counseling encouraged  Provided EMMI education information on Relieving Stress Discussed referral to [facility] to assist with connecting to mental health provider Made referral to [facility]  Patient Self-Care Activities: Call [facility] to follow up on co-pay amount for therapy Continue with compliance of taking medication  I have placed a referral [place] they will contact you.   Review your EMMI educational information (Relieving Stress) Look for an e-mail from [facility]. Practice relax breathing 3 times per day Remember to call the 1800 Quit Now line We will discuss behavior changes with eating next visit     Casimer Lanius, [place] Licensed Clinical Social Worker [employer] [employer] [employer]  [place]  [phone]

## 2021-12-20 NOTE — Patient Instructions (Signed)
Visit Information  Thank you for taking time to visit with me today. Please don't hesitate to contact me if I can be of assistance to you before our next scheduled telephone appointment.  Following are the goals we discussed today:  Patient Self-Care Activities: Call [facility] to follow up on co-pay amount for therapy Continue with compliance of taking medication  I have placed a referral [place] they will contact you.   Review your EMMI educational information (Relieving Stress) Look for an e-mail from [facility]. Practice relax breathing 3 times per day Remember to call the 1800 Quit Now line We will discuss behavior changes with eating next visit  Our next appointment is by telephone on Feb 15th  at 11:30  Please call the care guide team at [phone] if you need to cancel or reschedule your appointment.   If you are experiencing a Mental Health or [place] or need someone to talk to, please call 1-800-273-TALK (toll free, 24 hour hotline) go to [facility] [address], [place] [phone])   Patient verbalizes understanding of instructions and care plan provided today and agrees to view in [place]. Active MyChart status confirmed with patient.    Casimer Lanius, LCSW Licensed Clinical Social Worker [employer] [employer]  [place]  [phone]

## 2022-01-03 ENCOUNTER — Ambulatory Visit: Payer: Medicare Other | Admitting: Licensed Clinical Social Worker

## 2022-01-03 DIAGNOSIS — F411 Generalized anxiety disorder: Secondary | ICD-10-CM

## 2022-01-03 DIAGNOSIS — F418 Other specified anxiety disorders: Secondary | ICD-10-CM

## 2022-01-03 NOTE — Chronic Care Management (AMB) (Signed)
Chronic Care Management   Clinical Social Work Note  01/03/2022 Name: Emily [name] MRN: [id number] DOB: [birth year]  Emily [name] is a 51 y.o. year old female who is a primary care patient of Emily Salina Real Cons, MD. The CCM team was consulted to assist the patient with chronic disease management and/or care coordination needs related to: Mental Health Counseling and Resources.   Engaged with patient by telephone for follow up visit in response to provider referral for social work chronic care management and care coordination services.   Consent to Services:  The patient was given information about Chronic Care Management services, agreed to services, and gave verbal consent prior to initiation of services.  Please see initial visit note for detailed documentation.   Patient agreed to services and consent obtained.   Summary:  Patient continues to experience difficulty with chronic health concerns and has not made progress with steps to manage mental health. She is not feeling well today, phone appointment was short..  See Care Plan below for interventions and patient self-care actives.  Follow up Plan: Patient would like continued follow-up from CCM LCSW.  per patient's request will follow up in 1 week.  Will call office if needed prior to next encounter.   Assessment: Review of patient past medical history, allergies, medications, and health status, including review of relevant consultants reports was performed today as part of a comprehensive evaluation and provision of chronic care management and care coordination services.     SDOH (Social Determinants of Health) assessments and interventions performed:    Advanced Directives Status: Not addressed in this encounter.  CCM Care Plan  Conditions to be addressed/monitored: Anxiety and Depression;  chronic medical conditions  Care Plan : LCSW Plan of Care  Updates made by [name], LCSW since 01/03/2022  12:00 AM     Problem: Mental Health Needs- Depression   Priority: High     Long-Range Goal: Develope Self Care Plan for Management of Depression   Start Date: 05/17/2021  Expected End Date: 03/18/2022  Recent Progress: On track  Priority: High  Note:   Current barriers:   Chronic Mental Health needs related to depression Mental Health Concerns  and Lacks knowledge of community resources Needs Support, Education, and Care Coordination in order to meet unmet mental health needs. Has not connected with [facility]   Clinical Goal(s): patient will work with SW to address concerns related to depression/mental health support needs    Interventions: 1:1 collaboration with primary care provider regarding development and update of comprehensive plan of care as evidenced by provider attestation and co-signature Inter-disciplinary care team collaboration (see longitudinal plan of care) Evaluation of current treatment plan related to  self management and patient's adherence to plan as established by provider Review resources, discussed options and provided patient information about  Additional mental health options  Mental Health:  (Status: Goal on Track (progressing): YES.) Evaluation of current treatment plan related to  symptoms of anxiety and depression Motivational Interviewing employed Solution-Focused Strategies employed:  Mindfulness or Relaxation training provided Active listening / Reflection utilized  Problem [place] strategies reviewed Provided psychoeducation for mental health needs  Participation in counseling encouraged  Provided EMMI education information on Relieving Stress Made referral to [place]  Patient Self-Care Activities: Call [facility] to follow up on co-pay amount for therapy Continue with compliance of taking medication  I have placed a referral [place] they will contact you.   Review your EMMI educational  information  (Relieving Stress) Look for an e-mail from [facility]. Practice relax breathing 3 times per day Remember to call the 1800 Quit Now line We will discuss behavior changes with eating next visit     Casimer Lanius, [place] Licensed Clinical Social Worker [employer] [employer] [employer]  [place]  [phone]

## 2022-01-03 NOTE — Patient Instructions (Signed)
Visit Information  Thank you for taking time to visit with me today. Please don't hesitate to contact me if I can be of assistance to you before our next scheduled telephone appointment.  Following are the goals we discussed today: Mental Health Treatment  Patient Self-Care Activities: Call [facility] to follow up on co-pay amount for therapy Continue with compliance of taking medication  I have placed a referral [place] they will contact you.   Review your EMMI educational information (Relieving Stress) Look for an e-mail from [facility]. Practice relax breathing 3 times per day Remember to call the 1800 Quit Now line We will discuss behavior changes with eating next visit  Our next appointment is by telephone on March 22nd at 11:30  Please call the care guide team at [phone] if you need to cancel or reschedule your appointment.   If you are experiencing a Mental Health or [place] or need someone to talk to, please call the [place] National Suicide Prevention Lifeline: 519 281 0764 or TTY: 8160237954 TTY [phone]) to talk to a trained counselor call 1-800-273-TALK (toll free, 24 hour hotline)   Patient verbalizes understanding of instructions and care plan provided today and agrees to view in [place]. Active MyChart status confirmed with patient.    Casimer Lanius, LCSW Licensed Clinical Social Worker [employer] [employer]  [place]  [phone]

## 2022-01-10 ENCOUNTER — Ambulatory Visit: Payer: Medicare Other | Admitting: Licensed Clinical Social Worker

## 2022-01-10 DIAGNOSIS — F411 Generalized anxiety disorder: Secondary | ICD-10-CM

## 2022-01-10 DIAGNOSIS — F418 Other specified anxiety disorders: Secondary | ICD-10-CM

## 2022-01-10 NOTE — Chronic Care Management (AMB) (Signed)
Chronic Care Management   Clinical Social Work Note  01/10/2022 Name: Emily [name] MRN: [id number] DOB: 1970-11-25  Emily [name] is a 51 y.o. year old female who is a primary care patient of [name] Real Cons, MD. The CCM team was consulted to assist the patient with chronic disease management and/or care coordination needs related to: [place] and Resources and Financial Difficulties related to housing .   Engaged with patient by telephone for follow up visit in response to provider referral for social work chronic care management and care coordination services.   Consent to Services:  The patient was given information about Chronic Care Management services, agreed to services, and gave verbal consent prior to initiation of services.  Please see initial visit note for detailed documentation.  Patient agreed to services and consent obtained.   Summary: Assessed patient's current treatment, progress, coping skills, support system and barriers to care.  She is currently experiencing symptoms of  anxiety and depression which seems to be exacerbated by layers of stressors (financial and managing her health etc..). Reports notice of increase in mood swings, forgetting and difficulty with completing task..  See Care Plan below for interventions and patient self-care actives.  Recommendation: Patient may benefit from, and is in agreement to utilize the list we worked on today to complete task related to self-care for physical and mental health.  ( Paperwork for counseling, and other upcoming appointments) .   Follow up Plan: Patient would like continued follow-up from CCM LCSW.  per patient's request will follow up in 2 weeks.  Will call office if needed prior to next encounter.   Assessment: Review of patient past medical history, allergies, medications, and health status, including review of relevant consultants reports was performed today as part of a  comprehensive evaluation and provision of chronic care management and care coordination services.     SDOH (Social Determinants of Health) assessments and interventions performed:  SDOH Interventions    Flowsheet Row Most Recent Value  SDOH Interventions   Housing Interventions [id number] Referral        Advanced Directives Status: Not addressed in this encounter.  CCM Care Plan  Conditions to be addressed/monitored: Anxiety and Depression; Financial constraints   Care Plan : LCSW Plan of Care  Updates made by [name], LCSW since 01/10/2022 12:00 AM     Problem: Mental Health Needs- Depression   Priority: High     Long-Range Goal: Develope Self Care Plan for Management of Depression   Start Date: 05/17/2021  Expected End Date: 04/17/2022  This Visit's Progress: On track  Recent Progress: On track  Priority: High  Note:   Current barriers:   Chronic Mental Health needs related to depression Mental Health Concerns  and Lacks knowledge of community resources Needs Support, Education, and Care Coordination in order to meet unmet mental health needs. Has not connected with [facility]   Clinical Goal(s): patient will work with SW to address concerns related to depression/mental health support needs patient will work with [employer] to address needs related to symptoms of depression and anxiety    Interventions: 1:1 collaboration with primary care provider regarding development and update of comprehensive plan of care as evidenced by provider attestation and co-signature Inter-disciplinary care team collaboration (see longitudinal plan of care) Evaluation of current treatment plan related to  self management and patient's adherence to plan as established by provider  Mental Health:  (Status: Goal on Track (progressing): YES.) Evaluation of  current treatment plan related to  symptoms of anxiety and depression Motivational Interviewing  employed Solution-Focused Strategies employed:  Problem [place] strategies reviewed Provided psychoeducation for mental health needs  Reviewed mental health medications and discussed importance of compliance: reports missed does of Lexapro Participation in counseling encouraged  Provided EMMI education information on Depression Set goals for completing task related to getting connected for therapy  Social Determinants of Health in Patient with Mood Instability and Stress at home :  (Status: New goal.) SDOH assessments completed: Housing  Evaluation of current treatment plan related to Mood Instability Solution-Focused Strategies employed:  Active listening / Reflection utilized  Emotional Support Provided Problem [place] strategies reviewed Made referral to [place]    Patient Self-Care Activities: Call [facility] Tax to follow up on tax concerns Review your list each day that we discussed Continue with compliance of taking medication  I have placed a referral [employer] [phone] they will contact you.   Review your EMMI educational information (Relieving Stress and Depression ) Look for an e-mail from [facility]. Practice relax breathing 3 times per day Remember to call the 1800 Quit Now line      Casimer Lanius, LCSW Licensed Clinical Social Worker [employer] [employer]  [place]  [phone]

## 2022-01-10 NOTE — Patient Instructions (Signed)
Visit Information  Thank you for taking time to visit with me today. Please don't hesitate to contact me if I can be of assistance to you before our next scheduled telephone appointment.  Following are the goals we discussed today: Mental Health support Patient Self-Care Activities: Call [facility] Tax to follow up on tax concerns Review your list each day that we discussed Continue with compliance of taking medication  I have placed a referral [employer] [phone] they will contact you.   Review your EMMI educational information (Relieving Stress and Depression ) Look for an e-mail from [facility]. Practice relax breathing 3 times per day Remember to call the 1800 Quit Now line  Our next appointment is by telephone on March 8th at 1:30  Please call the care guide team at [phone] if you need to cancel or reschedule your appointment.   If you are experiencing a Mental Health or [place] or need someone to talk to, please call the Suicide and Crisis Lifeline: 988 call 1-800-273-TALK (toll free, 24 hour hotline) go to [facility] [address] [place] [phone])   Patient verbalizes understanding of instructions and care plan provided today and agrees to view in [place]. Active MyChart status confirmed with patient.    Casimer Lanius, LCSW Licensed Clinical Social Worker [employer] [employer]  [place]  [phone]

## 2022-01-15 DIAGNOSIS — M5417 Radiculopathy, lumbosacral region: Secondary | ICD-10-CM | POA: Diagnosis not present

## 2022-01-16 DIAGNOSIS — F418 Other specified anxiety disorders: Secondary | ICD-10-CM

## 2022-01-17 ENCOUNTER — Ambulatory Visit: Payer: Medicare Other

## 2022-01-17 DIAGNOSIS — H5203 Hypermetropia, bilateral: Secondary | ICD-10-CM | POA: Diagnosis not present

## 2022-01-21 ENCOUNTER — Encounter (HOSPITAL_COMMUNITY): Payer: Self-pay | Admitting: Emergency Medicine

## 2022-01-21 ENCOUNTER — Emergency Department (HOSPITAL_COMMUNITY): Payer: Medicare Other

## 2022-01-21 ENCOUNTER — Other Ambulatory Visit: Payer: Self-pay

## 2022-01-21 ENCOUNTER — Emergency Department (HOSPITAL_COMMUNITY)
Admission: EM | Admit: 2022-01-21 | Discharge: 2022-01-21 | Disposition: A | Discharge status: Home / Self Care | Payer: Medicare Other | Attending: Emergency Medicine | Admitting: Emergency Medicine

## 2022-01-21 DIAGNOSIS — S199XXA Unspecified injury of neck, initial encounter: Secondary | ICD-10-CM | POA: Diagnosis not present

## 2022-01-21 DIAGNOSIS — S161XXA Strain of muscle, fascia and tendon at neck level, initial encounter: Secondary | ICD-10-CM | POA: Insufficient documentation

## 2022-01-21 DIAGNOSIS — S1090XA Unspecified superficial injury of unspecified part of neck, initial encounter: Secondary | ICD-10-CM | POA: Diagnosis present

## 2022-01-21 DIAGNOSIS — W01198A Fall on same level from slipping, tripping and stumbling with subsequent striking against other object, initial encounter: Secondary | ICD-10-CM | POA: Diagnosis not present

## 2022-01-21 DIAGNOSIS — M62838 Other muscle spasm: Secondary | ICD-10-CM | POA: Insufficient documentation

## 2022-01-21 DIAGNOSIS — R519 Headache, unspecified: Secondary | ICD-10-CM | POA: Insufficient documentation

## 2022-01-21 DIAGNOSIS — S0990XA Unspecified injury of head, initial encounter: Secondary | ICD-10-CM | POA: Diagnosis not present

## 2022-01-21 MED ORDER — KETOROLAC TROMETHAMINE 30 MG/ML IJ SOLN
15.0000 mg | Freq: Once | INTRAMUSCULAR | Status: AC
Start: 2022-01-21 — End: 2022-01-21
  Administered 2022-01-21: 15 mg via INTRAVENOUS
  Filled 2022-01-21: qty 1

## 2022-01-21 MED ORDER — METHOCARBAMOL 1000 MG/10ML IJ SOLN
500.0000 mg | Freq: Once | INTRAMUSCULAR | Status: AC
  Administered 2022-01-21: 500 mg via INTRAMUSCULAR
  Filled 2022-01-21: qty 5

## 2022-01-21 NOTE — ED Provider Notes (Signed)
?  [place] DEPT ?[facility] Emergency Department ?Provider Note ?MRN:  [id number]  ?Arrival date & time: 01/21/22    ? ?Chief Complaint   ?Neck Pain ?  ?History of Present Illness   ?Emily [name] is a 51 y.o. year-old female presents to the ED with chief complaint of neck pain and head pain.  She states that she slipped and fell the other day striking the back of her head leaving a "goose egg."  She now complains of neck pain and muscle soreness.  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  She reports increased pain if she lifts her arms, but denies numbness or weakness.. ? ?History provided by patient. ? ? ?Review of Systems  ?Pertinent review of systems noted in HPI.  ? ? ?Physical Exam  ? ?Vitals:  ? 01/21/22 0415 01/21/22 0602  ?BP: 97/77 98/63  ?Pulse: 76 74  ?Resp:  18  ?Temp:    ?SpO2: 95% 95%  ?  ?CONSTITUTIONAL:  well-appearing, NAD ?NEURO:  Alert and oriented x 3, CN 3-12 grossly intact ?EYES:  eyes equal and reactive ?ENT/NECK:  Supple, no stridor, TTP of upper trap and cervical paraspinal muscles ?CARDIO:  normal rate, regular rhythm, appears well-perfused  ?PULM:  No respiratory distress, CTAB ?GI/GU:  non-distended,  ?MSK/SPINE:  No gross deformities, no edema, moves all extremities  ?SKIN:  no rash, atraumatic ? ? ?*Additional and/or pertinent findings included in MDM below ? ?Diagnostic and Interventional Summary  ? ? ?Labs Reviewed - No data to display  ?CT HEAD WO CONTRAST (5MM)  ?Final Result  ?  ?CT Cervical Spine Wo Contrast  ?Final Result  ?  ?  ?Medications  ?ketorolac (TORADOL) 30 MG/ML injection 15 mg (15 mg Intravenous Given 01/21/22 0425)  ?methocarbamol (ROBAXIN) injection 500 mg (500 mg Intramuscular Given 01/21/22 0426)  ?  ? ?Procedures  /  Critical Care ?Procedures ? ?ED Course and Medical Decision Making  ?I have reviewed the triage vital signs, the nursing notes, and pertinent available records from the EMR. ? ?Complexity of Problems Addressed: ?Moderate  Complexity: Acute complicated illness or injury, requiring diagnostic workup as ordered and performed below. ?Comorbidities affecting this illness/injury include: ?Prior cervical spine fusion ?Social Determinants Affecting Care: ? ? ? ?ED Course: ?After considering the following differential, head injury, concussion, intracranial bleed, c-spine fracture, I ordered CT head and neck. ?I visualized the CT, which is notable for no fx and agree with radiologist interpretation.. ? ?  ? ?Consultants: ?No consultations were needed in caring for this patient. ? ?Treatment and Plan: ?Continue with RICE and muscle relaxer at home. ? ?Emergency department workup does not suggest an emergent condition requiring admission or immediate intervention beyond  what has been performed at this time. The patient is safe for discharge and has  been instructed to return immediately for worsening symptoms, change in  symptoms or any other concerns ? ? ? ?Final Clinical Impressions(s) / ED Diagnoses  ? ?  ICD-10-CM   ?1. Acute strain of neck muscle, initial encounter  S16.1XXA   ?  ?2. Muscle spasms of neck  M62.838   ?  ?  ?ED Discharge Orders   ? ? None  ? ?  ?  ? ? ?Discharge Instructions Discussed with and Provided to Patient:  ? ?Discharge Instructions   ?None ?  ? ?  ?Montine Circle, PA-C ?01/21/22 2952 ? ?  ?Ripley Fraise, MD ?01/21/22 8413 ? ?

## 2022-01-21 NOTE — ED Notes (Signed)
Pt discharged and ambulated out of the ED without difficulty. 

## 2022-01-21 NOTE — ED Triage Notes (Signed)
Pt reports she fell back onto her porch on Friday night hitting the back of her head.  Unsure of LOC but remembers incident and is not on blood thinners.  Now has pain in her neck and back when she moves her arms and neck.  Pt does have hx of back pain "but this is worse."   ?

## 2022-01-23 ENCOUNTER — Ambulatory Visit (INDEPENDENT_AMBULATORY_CARE_PROVIDER_SITE_OTHER): Payer: Medicare Other | Admitting: Licensed Clinical Social Worker

## 2022-01-23 DIAGNOSIS — F411 Generalized anxiety disorder: Secondary | ICD-10-CM

## 2022-01-23 DIAGNOSIS — F418 Other specified anxiety disorders: Secondary | ICD-10-CM

## 2022-01-23 NOTE — Patient Instructions (Signed)
Visit Information ? ?Thank you for taking time to visit with me today. Please don't hesitate to contact me if I can be of assistance to you before our next scheduled telephone appointment. ? ?Following are the goals we discussed today: Managing symptoms of Anxiety ?Patient Self-Care Activities: ?Call [facility] Tax to follow up on tax concerns ?Review your list each day that we discussed ?Continue with compliance of taking medication  ?Review your EMMI educational information (Relieving Stress and Depression ) Look for an e-mail from [facility]. ?Practice relax breathing 3 times per day ?Remember to call the 1800 Quit Now line ? ?Our next appointment is by telephone on March 14th at 1:30 ? ?Please call the care guide team at [phone] if you need to cancel or reschedule your appointment.  ? ?If you are experiencing a Mental Health or [place] or need someone to talk to, please call the Suicide and Crisis Lifeline: 988 ?call the [place] National Suicide Prevention Lifeline: (272)199-8772 or TTY: (540)552-5412 TTY [phone]) to talk to a trained counselor ?call 1-800-273-TALK (toll free, 24 hour hotline) ?go to [facility] [address] [place] [phone])  ? ?Patient verbalizes understanding of instructions and care plan provided today and agrees to view in [place]. Active MyChart status confirmed with patient.   ? ?Casimer Lanius, LCSW ?Licensed Clinical Social Worker [employer] [employer]  ?[place]  ?[phone]  ?

## 2022-01-23 NOTE — Chronic Care Management (AMB) (Signed)
?Chronic Care Management  ? Clinical Social Work Note ? ?01/23/2022 ?Name: Eshani Maestre MRN: [id number] DOB: [birth year] ? ?Guinevere Stephenson is a 51 y.o. year old female who is a primary care patient of Hoyt Koch, MD. The CCM team was consulted to assist the patient with chronic disease management and/or care coordination needs related to: [place] and Resources and managing symptoms of anxiety due to recent fall .  ? ?Engaged with patient by telephone for follow up visit in response to provider referral for social work chronic care management and care coordination services.  ? ?Consent to Services:  ?The patient was given information about Chronic Care Management services, agreed to services, and gave verbal consent prior to initiation of services.  Please see initial visit note for detailed documentation.  ? ?Patient agreed to services and consent obtained.  ? ?Summary: Assessed patient's current treatment, progress, coping skills, support system and barriers to care.  She is currently experiencing symptoms of  unmanaged anxiety which seems to be exacerbated by recent fall. This has prevented her from completing required new patient paperwork to get initial therapy appointment scheduled. CCM LCSW collaborated with referral coordinator at [employer] and assisted patient with completed paperwork .  See Care Plan below for interventions and patient self-care actives. ? ?Follow up Plan: Patient would like continued follow-up from CCM LCSW.  per patient's request will follow up in 1 week.  Will call office if needed prior to next encounter. ?  ?Assessment: Review of patient past medical history, allergies, medications, and health status, including review of relevant consultants reports was performed today as part of a comprehensive evaluation and provision of chronic care management and care coordination services.    ? ?SDOH (Social Determinants of Health)  assessments and interventions performed:  ?SDOH Interventions   ? ?Flowsheet Row Most Recent Value  ?SDOH Interventions   ?Depression Interventions/Treatment  Counseling, Medication  ? ?  ?  ? ?Advanced Directives Status: Not addressed in this encounter. ? ?CCM Care Plan ? ?Conditions to be addressed/monitored: Anxiety and Depression;  ? ?Care Plan : LCSW Plan of Care  ?Updates made by [name], LCSW since 01/23/2022 12:00 AM  ?  ? ?Problem: Mental Health Needs- Depression   ?Priority: High  ?  ? ?Long-Range Goal: Develope Self Care Plan for Management of Depression   ?Start Date: 05/17/2021  ?Expected End Date: 04/17/2022  ?This Visit's Progress: On track  ?Recent Progress: On track  ?Priority: High  ?Note:   ?Current barriers:   ?Chronic Mental Health needs related to depression ?Mental Health Concerns  and Lacks knowledge of community resources ?Needs Support, Education, and Care Coordination in order to meet unmet mental health needs. ?Has not been able to complete paperwork for [facility] ? ?Clinical Goal(s): patient will work with SW to address concerns related to depression/mental health support needs ?patient will work with [place] to address needs related to symptoms of depression and anxiety   ? ?Interventions: ?1:1 collaboration with primary care provider regarding development and update of comprehensive plan of care as evidenced by provider attestation and co-signature ?Inter-disciplinary care team collaboration (see longitudinal plan of care) ?Evaluation of current treatment plan related to  self management and patient's adherence to plan as established by provider ? ?Mental Health:  (Status: Goal on Track (progressing): YES.) ?Evaluation of current treatment plan related to  symptoms of anxiety and depression ?Motivational Interviewing employed ?PHQ2/ PHQ9 completed ?Solution-Focused Strategies employed:  ?  Active listening / Reflection utilized  ?Emotional Support  Provided ?Problem Solving /Task Center strategies reviewed ?Provided psychoeducation for mental health needs  ?Reviewed mental health medications and discussed importance of compliance: reports missed does of Lexapro ?Provided EMMI education information on Managing Anxiety Around Daily Tasks ?Suicidal Ideation/Homicidal Ideation assessed: denied ?Assisted with completing paperwork for [facility] ? ?Social Determinants of Health in Patient with Mood Instability and Stress at home :  (Status: New goal.) ?SDOH assessments completed: Housing  ?Evaluation of current treatment plan related to Mood Instability ?Solution-Focused Strategies employed:  ?Active listening / Reflection utilized  ?Emotional Support Provided ?Problem Solving /Task Center strategies reviewed ?Made referral to [facility]/ [employer]   ? ?Patient Self-Care Activities: ?Call [facility] to follow up on tax concerns ?Review your list each day that we discussed ?Continue with compliance of taking medication  ?I have placed a referral [place] they will contact you.   ?Review your EMMI educational information (Relieving Stress and Depression ) Look for an e-mail from [facility]. ?Practice relax breathing 3 times per day ?Remember to call the 1800 Quit Now line ? ?  ? Casimer Lanius, LCSW ?Licensed Clinical Social Worker [facility]  ?[place]  ?[phone]  ? ? ?

## 2022-01-24 ENCOUNTER — Telehealth: Payer: Medicare Other

## 2022-01-26 DIAGNOSIS — M542 Cervicalgia: Secondary | ICD-10-CM | POA: Diagnosis not present

## 2022-01-26 DIAGNOSIS — M4712 Other spondylosis with myelopathy, cervical region: Secondary | ICD-10-CM | POA: Diagnosis not present

## 2022-01-26 DIAGNOSIS — S134XXA Sprain of ligaments of cervical spine, initial encounter: Secondary | ICD-10-CM | POA: Diagnosis not present

## 2022-01-30 ENCOUNTER — Telehealth: Payer: Self-pay | Admitting: Licensed Clinical Social Worker

## 2022-01-30 ENCOUNTER — Telehealth: Payer: Medicare Other

## 2022-01-30 NOTE — Chronic Care Management (AMB) (Signed)
? ?   Clinical Social Work  ?Care Management  ? Phone Outreach  ? ? ?01/30/2022 ?Name: Emily [name] MRN: [id number] DOB: Apr 11, 1971 ? ?Emily [name] is a 51 y.o. year old female who is a primary care patient of Hoyt Koch, MD .  ? ?Reason for referral: Mental Health Counseling and Resources.   ? ?F/U phone call today to assess needs, progress and barriers with care plan goals.   Telephone outreach was unsuccessful. A HIPPA compliant phone message was left for the patient providing contact information and requesting a return call.  ? ?Plan:CCM LCSW will wait for return call. If no return call is received, Will route chart to Care Guide to see if patient would like to reschedule phone appointment  ? ?Review of patient status, including review of consultants reports, relevant laboratory and other test results, and collaboration with appropriate care team members and the patient's provider was performed as part of comprehensive patient evaluation and provision of care management services.   ? ?Casimer Lanius, LCSW ?Licensed Clinical Social Worker [facility]  ?[place]  ?[phone]  ? ? ?  ? ? ?  ?

## 2022-02-06 ENCOUNTER — Telehealth: Payer: Self-pay | Admitting: *Deleted

## 2022-02-06 DIAGNOSIS — Z79891 Long term (current) use of opiate analgesic: Secondary | ICD-10-CM | POA: Diagnosis not present

## 2022-02-06 DIAGNOSIS — M5412 Radiculopathy, cervical region: Secondary | ICD-10-CM | POA: Diagnosis not present

## 2022-02-06 DIAGNOSIS — G894 Chronic pain syndrome: Secondary | ICD-10-CM | POA: Diagnosis not present

## 2022-02-06 NOTE — Chronic Care Management (AMB) (Signed)
?  Care Management  ? ?Note ? ?02/06/2022 ?Name: Emily [name] MRN: [id number] DOB: 03-03-1971 ? ?Emily [name] is a 51 y.o. year old female who is a primary care patient of Hoyt Koch, MD and is actively engaged with the care management team. I reached out to [employer] by phone today to assist with re-scheduling a follow up visit with the Licensed Clinical Social Worker ? ?Follow up plan: ?Unsuccessful telephone outreach attempt made. A HIPAA compliant phone message was left for the patient providing contact information and requesting a return call.  ?The care management team will reach out to the patient again over the next 7 days.  ?If patient returns call to provider office, please advise to call [place] at [phone]. ? ?Laverda Sorenson  ?Care Guide, Embedded Care Coordination ?  Care Management  ?Direct Dial: [phone] ? ?

## 2022-02-14 NOTE — Chronic Care Management (AMB) (Signed)
?  Care Management  ? ?Note ? ?02/14/2022 ?Name: Letia Guidry MRN: [id number] DOB: 24-Jul-1971 ? ?Brianny Soulliere is a 51 y.o. year old female who is a primary care patient of Hoyt Koch, MD and is actively engaged with the care management team. I reached out to [employer] by phone today to assist with re-scheduling a follow up visit with the Licensed Clinical Social Worker ? ?Follow up plan: ?Unsuccessful telephone outreach attempt made. A HIPAA compliant phone message was left for the patient providing contact information and requesting a return call.  ?The care management team will reach out to the patient again over the next 7 days.  ?If patient returns call to provider office, please advise to call [place] at [phone]. ? ?Laverda Sorenson  ?Care Guide, Embedded Care Coordination ?[place]  Care Management  ?Direct Dial: [phone] ? ?

## 2022-02-16 DIAGNOSIS — F411 Generalized anxiety disorder: Secondary | ICD-10-CM

## 2022-02-16 DIAGNOSIS — F418 Other specified anxiety disorders: Secondary | ICD-10-CM

## 2022-02-21 NOTE — Chronic Care Management (AMB) (Signed)
?  Care Management  ? ?Note ? ?02/21/2022 ?Name: Rashay Barnette MRN: [id number] DOB: February 05, 1971 ? ?Liberty Seto is a 51 y.o. year old female who is a primary care patient of Hoyt Koch, MD and is actively engaged with the care management team. I reached out to [employer] by phone today to assist with re-scheduling a follow up visit with the Licensed Clinical Social Worker ? ?Follow up plan: ?Unable to make contact on outreach attempts x 3. PCP Hoyt Koch, MD notified via routed documentation in medical record. We have been unable to make contact with the patient for follow up. The care management team is available to follow up with the patient after provider conversation with the patient regarding recommendation for care management engagement and subsequent re-referral to the care management team.  ? ?Laverda Sorenson  ?Care Guide, Embedded Care Coordination ?[place]  Care Management  ?Direct Dial: [phone] ? ?

## 2022-02-24 ENCOUNTER — Other Ambulatory Visit: Payer: Self-pay | Admitting: Internal Medicine

## 2022-02-24 DIAGNOSIS — F418 Other specified anxiety disorders: Secondary | ICD-10-CM

## 2022-03-06 ENCOUNTER — Ambulatory Visit (INDEPENDENT_AMBULATORY_CARE_PROVIDER_SITE_OTHER): Payer: Medicare Other

## 2022-03-06 DIAGNOSIS — F418 Other specified anxiety disorders: Secondary | ICD-10-CM

## 2022-03-06 DIAGNOSIS — J4520 Mild intermittent asthma, uncomplicated: Secondary | ICD-10-CM

## 2022-03-06 DIAGNOSIS — J301 Allergic rhinitis due to pollen: Secondary | ICD-10-CM

## 2022-03-06 DIAGNOSIS — I10 Essential (primary) hypertension: Secondary | ICD-10-CM

## 2022-03-06 NOTE — Patient Instructions (Signed)
Visit Information ? ?Following are the goals we discussed today:  ? ?Manage My Medication  ? ?Timeframe:  Long-Range Goal ?Priority:  High ?Start Date:     03/17/21                        ?Expected End Date: 03/07/2023        ? ?Follow Up Date 08/2022 ?  ?- call for medicine refill 2 or 3 days before it runs out ?- call if I am sick and can't take my medicine ?- keep a list of all the medicines I take; vitamins and herbals too  ?-Continue Tamoxifen 20 mg daily; contact oncologist if severe hot flashes occur ?-Start Nicotine patches daily and nicotine lozenges as needed for smoking cessation  ? ?Plan: Telephone follow up appointment with care management team member scheduled for:  6 months ?The patient has been provided with contact information for the care management team and has been advised to call with any health related questions or concerns.  ? ?Tomasa Blase, PharmD ?Clinical Pharmacist, [place]  ? ?Please call the care guide team at [phone] if you need to cancel or reschedule your appointment.  ? ?Patient verbalizes understanding of instructions and care plan provided today and agrees to view in [place]. Active MyChart status confirmed with patient.   ? ?

## 2022-03-06 NOTE — Progress Notes (Signed)
? ?Chronic Care Management ?Pharmacy Note ? ?03/06/2022 ?Name:  Cannon Quinton MRN:  [id number] DOB:  Apr 16, 1971 ? ?Summary: ?-Patient reports that she has started following with [facility] - notes that the only medication change is that she is now taking percocet 10/328m instead of oxycodone 123m ?-Denies any issues or concerns about any of her medications at this time, endorses compliance to current medications  ?-Had started to use nicotine patches and lozenges and was finding success, but stopped due to illness in the family - stress levels increased and she is now smoking 1 ppd ? ?Recommendations/Changes made from today's visit: ?-Advised calling [place] Quitline if she feels she needs to discuss smoking cessation further - recommended retrial of Nicotine 21 mg/hr patch, apply in AM and remove at HS with nicotine lozenges PRN for cravings  ? ?Subjective: ?JuBerklee Batteys an 51.0. year old female who is a primary patient of CrHoyt KochMD.  The CCM team was consulted for assistance with disease management and care coordination needs.   ? ?Engaged with patient by telephone for follow up visit in response to provider referral for pharmacy case management and/or care coordination services.  ? ?Consent to Services:  ?The patient was given information about Chronic Care Management services, agreed to services, and gave verbal consent prior to initiation of services.  Please see initial visit note for detailed documentation.  ? ?Patient Care Team: ?CrHoyt KochMD as PCP - General (Internal Medicine) ?SaZonia KiefMD (Rehabilitation) ?ElRenato ShinMD (Endocrinology) ?TaBrien FewMD (Obstetrics and Gynecology) ?FaLaray AngerDO (Anesthesiology) ?CoErroll LunaMD as Consulting Physician (General Surgery) ?Foltanski, LiCleaster [facility] Pharmacist (Pharmacist) ? ?Patient lived in [place] a child, [place]. Pt was married  for 15 years, now divorced. [place] has been in [place] years. She lives with SO and her daughter lives down the street. She is on disability due to work injury.  ? ?Recent office visits: ?None since last visit   ? ?Recent consult visits: ?03/05/2022 - [facility] - note not available  ?02/06/2022 - [facility] - Pain management - no changes to medications - patient to decide if she will follow with [facility] or [facility] for pain management  ?01/15/2022 - [facility] - bilateral L4-L5 MBB Carm Ajam ? ?Hospital visits: ?01/21/2022 - ED visit  - neck and head pain - no changes to medications on discharge  ? ?Objective: ? ?Lab Results  ?Component Value Date  ? CREATININE 0.83 10/09/2021  ? BUN 6 10/09/2021  ? GFR 82.19 10/09/2021  ? GFRNONAA >60 09/18/2019  ? GFRAA >60 09/18/2019  ? NA 141 10/09/2021  ? K 4.4 10/09/2021  ? CALCIUM 9.8 10/09/2021  ? CO2 32 10/09/2021  ? GLUCOSE 100 (H) 10/09/2021  ? ? ?Lab Results  ?Component Value Date/Time  ? HGBA1C 5.5 07/18/2020 12:25 PM  ? HGBA1C 5.4 06/17/2018 11:28 AM  ? GFR 82.19 10/09/2021 03:10 PM  ? GFR 96.32 07/18/2020 12:25 PM  ?  ?Last diabetic Eye exam: No results found for: HMDIABEYEEXA  ?Last diabetic Foot exam: No results found for: HMDIABFOOTEX  ? ?Lab Results  ?Component Value Date  ? CHOL 163 07/18/2020  ? HDL 24.40 (L) 07/18/2020  ? LDPerley10 (H) 07/18/2020  ? LDLDIRECT 74.0 06/14/2015  ? TRIG 146.0 07/18/2020  ? CHOLHDL 7 07/18/2020  ? ? ? ?  Latest Ref  Rng & Units 10/09/2021  ?  3:10 PM 07/18/2020  ? 12:25 PM 09/18/2019  ?  2:00 PM  ?Hepatic Function  ?Total Protein 6.0 - 8.3 g/dL 7.8   7.8   7.1    ?Albumin 3.5 - 5.2 g/dL 4.4   4.4   3.8    ?AST 0 - 37 U/L '25   22   19    ' ?ALT 0 - 35 U/L '18   13   15    ' ?Alk Phosphatase 39 - 117 U/L 92   101   84    ?Total Bilirubin 0.2 - 1.2 mg/dL 0.5   0.6   0.6    ?Bilirubin, Direct 0.0 - 0.3 mg/dL  0.1     ? ? ?Lab Results  ?Component Value Date/Time  ? TSH 0.46 10/09/2021 03:10 PM  ? TSH 0.46 07/18/2020  12:25 PM  ? FREET4 0.85 06/27/2016 11:05 AM  ? FREET4 0.66 12/16/2015 10:30 AM  ? ? ? ?  Latest Ref Rng & Units 10/09/2021  ?  3:10 PM 07/18/2020  ? 12:25 PM 09/18/2019  ?  2:00 PM  ?CBC  ?WBC 4.0 - 10.5 K/uL 7.2   10.5   9.5    ?Hemoglobin 12.0 - 15.0 g/dL 14.1   14.0   13.3    ?Hematocrit 36.0 - 46.0 % 42.7   41.3   38.9    ?Platelets 150.0 - 400.0 K/uL 212.0   280.0   254    ? ? ?No results found for: VD25OH ? ?Clinical ASCVD: No  ?The 10-year ASCVD risk score (Arnett DK, et al., 2019) is: 19.1% ?  Values used to calculate the score: ?    Age: 51 years ?    Sex: Female ?    Is Non-Hispanic African American: Yes ?    Diabetic: No ?    Tobacco smoker: Yes ?    Systolic Blood Pressure: 665 mmHg ?    Is BP treated: No ?    HDL Cholesterol: 24.4 mg/dL ?    Total Cholesterol: 163 mg/dL   ? ? ?  01/23/2022  ?  3:10 PM 11/06/2021  ?  2:30 PM 06/21/2021  ?  1:23 PM  ?Depression screen PHQ 2/9  ?Decreased Interest '3 2 3  ' ?Down, Depressed, Hopeless 1 0 0  ?PHQ - 2 Score '4 2 3  ' ?Altered sleeping 3  2  ?Tired, decreased energy 3  3  ?Change in appetite 2  1  ?Feeling bad or failure about yourself  2  0  ?Trouble concentrating 3  0  ?Moving slowly or fidgety/restless 3  0  ?Suicidal thoughts 0  0  ?PHQ-9 Score 20  9  ?Difficult doing work/chores Somewhat difficult  Somewhat difficult  ?  ? ? ?Social History  ? ?Tobacco Use  ?Smoking Status Every Day  ? Packs/day: 1.00  ? Years: 18.00  ? Pack years: 18.00  ? Types: Cigarettes  ?Smokeless Tobacco Never  ?Tobacco Comments  ? starting NRT patches/lozenges 03/2021  ? ?BP Readings from Last 3 Encounters:  ?01/21/22 98/63  ?11/22/21 (!) 175/97  ?10/09/21 100/70  ? ?Pulse Readings from Last 3 Encounters:  ?01/21/22 74  ?11/22/21 78  ?10/09/21 88  ? ?Wt Readings from Last 3 Encounters:  ?11/22/21 185 lb (83.9 kg)  ?10/09/21 185 lb 8 oz (84.1 kg)  ?09/12/21 185 lb 9.6 oz (84.2 kg)  ? ?BMI Readings from Last 3 Encounters:  ?11/22/21 37.37 kg/m?  ?10/09/21 36.53 kg/m?  ?  09/12/21 37.49 kg/m?   ? ? ?Assessment/Interventions: Review of patient past medical history, allergies, medications, health status, including review of consultants reports, laboratory and other test data, was performed as part of comprehensive evaluation and provision of chronic care management services.  ? ?SDOH:  (Social Determinants of Health) assessments and interventions performed: Yes ? ?SDOH Screenings  ? ?Alcohol Screen: Not on file  ?Depression (PHQ2-9): Medium Risk  ? PHQ-2 Score: 20  ?Financial Resource Strain: Not on file  ?Food Insecurity: Not on file  ?Housing: Medium Risk  ? Last Housing Risk Score: 1  ?Physical Activity: Not on file  ?Social Connections: Not on file  ?Stress: Stress Concern Present  ? Feeling of Stress : To some extent  ?Tobacco Use: High Risk  ? Smoking Tobacco Use: Every Day  ? Smokeless Tobacco Use: Never  ? Passive Exposure: Not on file  ?Transportation Needs: Not on file  ? ? ?[place] ? ?Allergies  ?Allergen Reactions  ? Other Hives  ?  Dermabond  ? Wound Dressing Adhesive Rash  ? Morphine Other (See Comments)  ?  Drops BP ?  ? Morphine And Related   ?  Lowers BP too much  ? Tape Dermatitis  ? ? ?Medications Reviewed Today   ? ? Reviewed by Tomasa Blase, [facility] (Pharmacist) on 03/06/22 at 1108  Med List Status: <None>  ? ?Medication Order Taking? Sig Documenting Provider Last Dose Status Informant  ?0.9 %  sodium chloride infusion [id number]   Ladene Artist, MD  Active   ?albuterol (PROVENTIL) (2.5 MG/3ML) 0.083% nebulizer solution [id number] Yes Take 3 mLs (2.5 mg total) by nebulization every 4 (four) hours as needed for wheezing or shortness of breath. Hoyt Koch, MD Taking Active   ?ALLERGY RELIEF 10 MG tablet [id number] Yes TAKE ONE TABLET BY MOUTH EVERYDAY AT BEDTIME Hoyt Koch, MD Taking Active   ?busPIRone (BUSPAR) 7.5 MG tablet [id number] Yes TAKE ONE TABLET BY MOUTH EVERY MORNING and TAKE ONE TABLET BY MOUTH EVERYDAY AT BEDTIME Hoyt Koch, MD Taking  Active   ?Calcium Carbonate-Simethicone 750-80 MG CHEW [id number] Yes Chew 1 tablet by mouth 3 times/day as needed-between meals & bedtime. [provider] Taking Active   ?cyclobenzaprine (FLEXERIL)

## 2022-03-12 ENCOUNTER — Other Ambulatory Visit: Payer: Self-pay | Admitting: Internal Medicine

## 2022-03-12 DIAGNOSIS — K219 Gastro-esophageal reflux disease without esophagitis: Secondary | ICD-10-CM

## 2022-03-12 DIAGNOSIS — J301 Allergic rhinitis due to pollen: Secondary | ICD-10-CM

## 2022-03-12 DIAGNOSIS — F418 Other specified anxiety disorders: Secondary | ICD-10-CM

## 2022-03-12 DIAGNOSIS — J4520 Mild intermittent asthma, uncomplicated: Secondary | ICD-10-CM

## 2022-03-15 ENCOUNTER — Ambulatory Visit (INDEPENDENT_AMBULATORY_CARE_PROVIDER_SITE_OTHER): Payer: Medicare Other | Admitting: Psychology

## 2022-03-15 DIAGNOSIS — F329 Major depressive disorder, single episode, unspecified: Secondary | ICD-10-CM | POA: Diagnosis not present

## 2022-03-15 DIAGNOSIS — F418 Other specified anxiety disorders: Secondary | ICD-10-CM | POA: Diagnosis not present
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
## 2022-03-18 DIAGNOSIS — J45909 Unspecified asthma, uncomplicated: Secondary | ICD-10-CM | POA: Diagnosis not present

## 2022-03-18 DIAGNOSIS — F32A Depression, unspecified: Secondary | ICD-10-CM | POA: Diagnosis not present

## 2022-03-18 DIAGNOSIS — F17210 Nicotine dependence, cigarettes, uncomplicated: Secondary | ICD-10-CM

## 2022-03-18 DIAGNOSIS — I10 Essential (primary) hypertension: Secondary | ICD-10-CM | POA: Diagnosis not present

## 2022-03-21 DIAGNOSIS — M5412 Radiculopathy, cervical region: Secondary | ICD-10-CM | POA: Diagnosis not present

## 2022-03-29 DIAGNOSIS — M5412 Radiculopathy, cervical region: Secondary | ICD-10-CM | POA: Diagnosis not present

## 2022-03-30 ENCOUNTER — Ambulatory Visit (INDEPENDENT_AMBULATORY_CARE_PROVIDER_SITE_OTHER): Payer: Medicare Other | Admitting: Psychology

## 2022-03-30 DIAGNOSIS — F418 Other specified anxiety disorders: Secondary | ICD-10-CM | POA: Diagnosis not present

## 2022-03-30 DIAGNOSIS — F329 Major depressive disorder, single episode, unspecified: Secondary | ICD-10-CM | POA: Diagnosis not present
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
## 2022-04-13 ENCOUNTER — Ambulatory Visit: Payer: Medicare Other | Admitting: Psychology

## 2022-04-18 DIAGNOSIS — C50911 Malignant neoplasm of unspecified site of right female breast: Secondary | ICD-10-CM | POA: Diagnosis not present

## 2022-04-18 DIAGNOSIS — I10 Essential (primary) hypertension: Secondary | ICD-10-CM | POA: Diagnosis not present

## 2022-04-18 DIAGNOSIS — J410 Simple chronic bronchitis: Secondary | ICD-10-CM | POA: Diagnosis not present

## 2022-04-19 DIAGNOSIS — M542 Cervicalgia: Secondary | ICD-10-CM | POA: Diagnosis not present

## 2022-04-27 ENCOUNTER — Ambulatory Visit (INDEPENDENT_AMBULATORY_CARE_PROVIDER_SITE_OTHER): Payer: Medicare Other | Admitting: Psychology

## 2022-04-27 DIAGNOSIS — F418 Other specified anxiety disorders: Secondary | ICD-10-CM | POA: Diagnosis not present

## 2022-04-27 DIAGNOSIS — F329 Major depressive disorder, single episode, unspecified: Secondary | ICD-10-CM
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
## 2022-04-30 ENCOUNTER — Other Ambulatory Visit: Payer: Self-pay | Admitting: Internal Medicine

## 2022-04-30 DIAGNOSIS — K649 Unspecified hemorrhoids: Secondary | ICD-10-CM

## 2022-05-09 DIAGNOSIS — N8110 Cystocele, unspecified: Secondary | ICD-10-CM | POA: Diagnosis not present

## 2022-05-09 DIAGNOSIS — I10 Essential (primary) hypertension: Secondary | ICD-10-CM | POA: Diagnosis not present

## 2022-05-09 DIAGNOSIS — R339 Retention of urine, unspecified: Secondary | ICD-10-CM | POA: Diagnosis not present

## 2022-05-17 DIAGNOSIS — M5412 Radiculopathy, cervical region: Secondary | ICD-10-CM | POA: Diagnosis not present

## 2022-05-17 DIAGNOSIS — M549 Dorsalgia, unspecified: Secondary | ICD-10-CM | POA: Diagnosis not present

## 2022-05-18 DIAGNOSIS — M5412 Radiculopathy, cervical region: Secondary | ICD-10-CM | POA: Diagnosis not present

## 2022-05-23 ENCOUNTER — Ambulatory Visit: Payer: Medicare Other

## 2022-05-23 ENCOUNTER — Encounter: Payer: Medicare Other | Admitting: Internal Medicine

## 2022-05-23 ENCOUNTER — Telehealth: Payer: Self-pay | Admitting: Internal Medicine

## 2022-05-23 VITALS — BP 126/80 | HR 85 | Temp 97.9°F | Resp 18 | Ht 59.0 in | Wt 187.6 lb

## 2022-05-23 DIAGNOSIS — J4520 Mild intermittent asthma, uncomplicated: Secondary | ICD-10-CM

## 2022-05-23 DIAGNOSIS — J301 Allergic rhinitis due to pollen: Secondary | ICD-10-CM

## 2022-05-23 DIAGNOSIS — Z0000 Encounter for general adult medical examination without abnormal findings: Secondary | ICD-10-CM

## 2022-05-23 DIAGNOSIS — K219 Gastro-esophageal reflux disease without esophagitis: Secondary | ICD-10-CM

## 2022-05-23 DIAGNOSIS — Z1231 Encounter for screening mammogram for malignant neoplasm of breast: Secondary | ICD-10-CM

## 2022-05-23 MED ORDER — MONTELUKAST SODIUM 10 MG PO TABS: ORAL_TABLET | ORAL | 2 refills | Status: DC

## 2022-05-23 MED ORDER — LORATADINE 10 MG PO TABS: 10.0000 mg | ORAL_TABLET | Freq: Every day | ORAL | 2 refills | Status: DC

## 2022-05-23 MED ORDER — FLUTICASONE PROPIONATE 50 MCG/ACT NA SUSP: 2.0000 | Freq: Every day | NASAL | 1 refills | Status: DC

## 2022-05-23 MED ORDER — FAMOTIDINE 20 MG PO TABS: ORAL_TABLET | ORAL | 0 refills | Status: DC
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
## 2022-05-23 NOTE — Telephone Encounter (Signed)
PT visits today in need of a refill on their medications. PT states that [facility] has not received any refill requests yet (specifically noting the Flonase) . PT would like a refill put in for each of her medications as soon as possible!  CB if needed: [phone]

## 2022-05-23 NOTE — Telephone Encounter (Signed)
Refills have been sent to the pt's pharmacy

## 2022-05-23 NOTE — Patient Instructions (Addendum)
It was great seeing you today.   Refills have been sent toy your pharmacy   Remember to schedule your mammogram for December 2023.   Please keep your physical as scheduled with Dr. [name] 06/06/2022 @ 10:20 am

## 2022-05-29 DIAGNOSIS — M5414 Radiculopathy, thoracic region: Secondary | ICD-10-CM | POA: Diagnosis not present

## 2022-06-01 ENCOUNTER — Other Ambulatory Visit: Payer: Self-pay | Admitting: Internal Medicine

## 2022-06-01 DIAGNOSIS — T402X5A Adverse effect of other opioids, initial encounter: Secondary | ICD-10-CM

## 2022-06-05 ENCOUNTER — Ambulatory Visit (INDEPENDENT_AMBULATORY_CARE_PROVIDER_SITE_OTHER): Payer: Medicare Other | Admitting: Psychology

## 2022-06-05 DIAGNOSIS — F418 Other specified anxiety disorders: Secondary | ICD-10-CM | POA: Diagnosis not present

## 2022-06-05 DIAGNOSIS — F329 Major depressive disorder, single episode, unspecified: Secondary | ICD-10-CM
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
## 2022-06-06 ENCOUNTER — Other Ambulatory Visit: Payer: Self-pay | Admitting: Internal Medicine

## 2022-06-06 ENCOUNTER — Encounter: Payer: Medicare Other | Admitting: Internal Medicine

## 2022-06-06 DIAGNOSIS — F418 Other specified anxiety disorders: Secondary | ICD-10-CM

## 2022-06-11 ENCOUNTER — Other Ambulatory Visit: Payer: Self-pay | Admitting: Internal Medicine

## 2022-06-11 DIAGNOSIS — F418 Other specified anxiety disorders: Secondary | ICD-10-CM

## 2022-06-15 DIAGNOSIS — Z79891 Long term (current) use of opiate analgesic: Secondary | ICD-10-CM | POA: Diagnosis not present

## 2022-06-15 DIAGNOSIS — M5412 Radiculopathy, cervical region: Secondary | ICD-10-CM | POA: Diagnosis not present

## 2022-06-18 ENCOUNTER — Ambulatory Visit (INDEPENDENT_AMBULATORY_CARE_PROVIDER_SITE_OTHER): Payer: Medicare Other | Admitting: Internal Medicine

## 2022-06-18 ENCOUNTER — Encounter: Payer: Self-pay | Admitting: Internal Medicine

## 2022-06-18 VITALS — BP 124/80 | HR 87 | Resp 18 | Ht 59.0 in | Wt 188.2 lb

## 2022-06-18 DIAGNOSIS — K219 Gastro-esophageal reflux disease without esophagitis: Secondary | ICD-10-CM | POA: Diagnosis not present

## 2022-06-18 DIAGNOSIS — J4520 Mild intermittent asthma, uncomplicated: Secondary | ICD-10-CM | POA: Diagnosis not present

## 2022-06-18 DIAGNOSIS — J410 Simple chronic bronchitis: Secondary | ICD-10-CM

## 2022-06-18 DIAGNOSIS — I10 Essential (primary) hypertension: Secondary | ICD-10-CM

## 2022-06-18 DIAGNOSIS — Z0001 Encounter for general adult medical examination with abnormal findings: Secondary | ICD-10-CM | POA: Diagnosis not present

## 2022-06-18 DIAGNOSIS — D509 Iron deficiency anemia, unspecified: Secondary | ICD-10-CM

## 2022-06-18 DIAGNOSIS — F331 Major depressive disorder, recurrent, moderate: Secondary | ICD-10-CM

## 2022-06-18 DIAGNOSIS — R739 Hyperglycemia, unspecified: Secondary | ICD-10-CM

## 2022-06-18 DIAGNOSIS — F411 Generalized anxiety disorder: Secondary | ICD-10-CM

## 2022-06-18 NOTE — Patient Instructions (Addendum)
The note about the liver is on mychart.  Think about getting the shingles vaccine at the pharmacy

## 2022-06-18 NOTE — Assessment & Plan Note (Signed)
BP at goal off medication. Continue to monitor at every visit.

## 2022-06-18 NOTE — Assessment & Plan Note (Signed)
Taking protonix 40 mg daily and controlled. Will continue at same dosing.

## 2022-06-18 NOTE — Assessment & Plan Note (Signed)
She is cutting back some and unable to quit at this time. Reminded about risk and harm from smoking.

## 2022-06-18 NOTE — Progress Notes (Unsigned)
   Subjective:   [name], female    DOB: 1971/09/12, 51 y.o.   MRN: [id number]  HPI The patient is here for physical.  PMH, [facility], social history reviewed and updated  Review of Systems  Constitutional: Negative.   HENT: Negative.    Eyes: Negative.   Respiratory:  Negative for cough, chest tightness and shortness of breath.   Cardiovascular:  Negative for chest pain, palpitations and leg swelling.  Gastrointestinal:  Negative for abdominal distention, abdominal pain, constipation, diarrhea, nausea and vomiting.  Musculoskeletal:  Positive for arthralgias, myalgias, neck pain and neck stiffness.  Skin: Negative.   Neurological:  Positive for headaches.  Psychiatric/Behavioral: Negative.      Objective:  Physical Exam Constitutional:      Appearance: She is well-developed.  HENT:     Head: Normocephalic and atraumatic.  Cardiovascular:     Rate and Rhythm: Normal rate and regular rhythm.  Pulmonary:     Effort: Pulmonary effort is normal. No respiratory distress.     Breath sounds: Normal breath sounds. No wheezing or rales.  Abdominal:     General: Bowel sounds are normal. There is no distension.     Palpations: Abdomen is soft.     Tenderness: There is no abdominal tenderness. There is no rebound.  Musculoskeletal:        General: Tenderness present.     Cervical back: Normal range of motion.  Skin:    General: Skin is warm and dry.  Neurological:     Mental Status: She is alert and oriented to person, place, and time.     Coordination: Coordination normal.     Vitals:   06/18/22 1032  BP: 124/80  Pulse: 87  Resp: 18  SpO2: 97%  Weight: 188 lb 3.2 oz (85.4 kg)  Height: '4\' 11"'$  (1.499 m)    Assessment & Plan:

## 2022-06-18 NOTE — Assessment & Plan Note (Signed)
BMI 38 and complicated by GERD and depression and back pain.

## 2022-06-18 NOTE — Assessment & Plan Note (Signed)
Checking CBC to assess. 

## 2022-06-18 NOTE — Assessment & Plan Note (Signed)
Checking HgA1c today.  

## 2022-06-19 ENCOUNTER — Ambulatory Visit (INDEPENDENT_AMBULATORY_CARE_PROVIDER_SITE_OTHER): Payer: Medicare Other | Admitting: Psychology

## 2022-06-19 DIAGNOSIS — F329 Major depressive disorder, single episode, unspecified: Secondary | ICD-10-CM | POA: Diagnosis not present

## 2022-06-19 NOTE — Assessment & Plan Note (Signed)
Flu shot yearly. Covid-19 counseled. Shingrix counseled to get at pharmacy. Tetanus up to date. Colonoscopy up to date. Mammogram ordered previously advised to complete, pap smear not indicated. Counseled about sun safety and mole surveillance. Counseled about the dangers of distracted driving. Given 10 year screening recommendations.
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
## 2022-06-19 NOTE — Assessment & Plan Note (Signed)
Taking lexapro 20 mg daily and buspar 7.5 mg BID and not great control but she is not wanting change today due to potential for side effects or worsening with change. Continue for now and talked about alternative coping skills to help. Moderate symptoms.
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
## 2022-06-19 NOTE — Assessment & Plan Note (Addendum)
Current smoker and advised to quit. She is using albuterol prn and flovent 110 mcg 2 puffs BID and singulair. No flare today. Continue same routine.

## 2022-06-23 ENCOUNTER — Other Ambulatory Visit: Payer: Self-pay | Admitting: Internal Medicine

## 2022-06-23 DIAGNOSIS — J4520 Mild intermittent asthma, uncomplicated: Secondary | ICD-10-CM

## 2022-07-16 ENCOUNTER — Ambulatory Visit (INDEPENDENT_AMBULATORY_CARE_PROVIDER_SITE_OTHER): Payer: Medicare Other | Admitting: Psychology

## 2022-07-16 DIAGNOSIS — F329 Major depressive disorder, single episode, unspecified: Secondary | ICD-10-CM | POA: Diagnosis not present

## 2022-07-16 DIAGNOSIS — F418 Other specified anxiety disorders: Secondary | ICD-10-CM
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
## 2022-07-19 DIAGNOSIS — M4722 Other spondylosis with radiculopathy, cervical region: Secondary | ICD-10-CM | POA: Diagnosis not present

## 2022-07-19 DIAGNOSIS — M960 Pseudarthrosis after fusion or arthrodesis: Secondary | ICD-10-CM | POA: Diagnosis not present

## 2022-07-20 ENCOUNTER — Other Ambulatory Visit: Payer: Self-pay | Admitting: Orthopedic Surgery

## 2022-07-20 DIAGNOSIS — M4722 Other spondylosis with radiculopathy, cervical region: Secondary | ICD-10-CM

## 2022-07-20 DIAGNOSIS — M4712 Other spondylosis with myelopathy, cervical region: Secondary | ICD-10-CM

## 2022-07-30 ENCOUNTER — Ambulatory Visit (INDEPENDENT_AMBULATORY_CARE_PROVIDER_SITE_OTHER): Payer: Medicare Other | Admitting: Psychology

## 2022-07-30 DIAGNOSIS — F418 Other specified anxiety disorders: Secondary | ICD-10-CM

## 2022-07-30 DIAGNOSIS — F329 Major depressive disorder, single episode, unspecified: Secondary | ICD-10-CM | POA: Diagnosis not present
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
## 2022-08-01 DIAGNOSIS — M5412 Radiculopathy, cervical region: Secondary | ICD-10-CM | POA: Diagnosis not present

## 2022-08-08 ENCOUNTER — Telehealth: Payer: Self-pay | Admitting: Internal Medicine

## 2022-08-08 NOTE — Telephone Encounter (Signed)
Patient needs a handicap placard application completed and also needs a letter getting her out of jury duty because of the pain she is in and she may have to have back surgery.  She would like to be called when these have been filled out so she can pick them up.

## 2022-08-08 NOTE — Telephone Encounter (Signed)
Please advise 

## 2022-08-09 NOTE — Telephone Encounter (Signed)
If a patient is requesting jury duty medical letter we will need juror number, date of service requested as well as which medical condition (s) they feel cause them to be physically unable to do jury duty. Depending we may or may not need visit.

## 2022-08-10 NOTE — Telephone Encounter (Signed)
Juror [phone] Date of service--08/21/22 '@8'$ :15 am Medical condition--problem neck/back--post surgery 10/2020

## 2022-08-13 ENCOUNTER — Ambulatory Visit (INDEPENDENT_AMBULATORY_CARE_PROVIDER_SITE_OTHER): Payer: Medicare Other | Admitting: Psychology

## 2022-08-13 DIAGNOSIS — F329 Major depressive disorder, single episode, unspecified: Secondary | ICD-10-CM | POA: Diagnosis not present

## 2022-08-13 DIAGNOSIS — F418 Other specified anxiety disorders: Secondary | ICD-10-CM | POA: Diagnosis not present
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
## 2022-08-13 NOTE — Telephone Encounter (Signed)
We have done the letter to her mychart. She will need to fill out the form from [place] and write a letter of her own describing her medical conditions and attach both her letter and mine to the form and submit for consideration.  will decide if this is accepted.

## 2022-08-14 ENCOUNTER — Ambulatory Visit
Admission: RE | Admit: 2022-08-14 | Discharge: 2022-08-14 | Disposition: A | Discharge status: Home / Self Care | Payer: Medicare Other | Source: Ambulatory Visit | Attending: Orthopedic Surgery | Admitting: Orthopedic Surgery

## 2022-08-14 DIAGNOSIS — M4712 Other spondylosis with myelopathy, cervical region: Secondary | ICD-10-CM

## 2022-08-14 DIAGNOSIS — M4722 Other spondylosis with radiculopathy, cervical region: Secondary | ICD-10-CM

## 2022-08-14 DIAGNOSIS — Z043 Encounter for examination and observation following other accident: Secondary | ICD-10-CM | POA: Diagnosis not present

## 2022-08-14 NOTE — Telephone Encounter (Signed)
Notified pt with instructions regarding [place] form and letter. Pt voiced understanding.

## 2022-08-16 DIAGNOSIS — M4326 Fusion of spine, lumbar region: Secondary | ICD-10-CM | POA: Diagnosis not present

## 2022-08-16 DIAGNOSIS — M4322 Fusion of spine, cervical region: Secondary | ICD-10-CM | POA: Diagnosis not present

## 2022-08-27 ENCOUNTER — Ambulatory Visit (INDEPENDENT_AMBULATORY_CARE_PROVIDER_SITE_OTHER): Payer: Medicare Other | Admitting: Psychology

## 2022-08-27 DIAGNOSIS — F418 Other specified anxiety disorders: Secondary | ICD-10-CM

## 2022-08-27 DIAGNOSIS — F329 Major depressive disorder, single episode, unspecified: Secondary | ICD-10-CM | POA: Diagnosis not present
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
## 2022-08-29 DIAGNOSIS — M5412 Radiculopathy, cervical region: Secondary | ICD-10-CM | POA: Diagnosis not present

## 2022-09-04 ENCOUNTER — Other Ambulatory Visit: Payer: Self-pay | Admitting: Gastroenterology

## 2022-09-04 ENCOUNTER — Telehealth: Payer: Medicare Other

## 2022-09-05 ENCOUNTER — Other Ambulatory Visit: Payer: Self-pay | Admitting: Internal Medicine

## 2022-09-05 DIAGNOSIS — F418 Other specified anxiety disorders: Secondary | ICD-10-CM

## 2022-09-05 DIAGNOSIS — K219 Gastro-esophageal reflux disease without esophagitis: Secondary | ICD-10-CM

## 2022-09-10 ENCOUNTER — Ambulatory Visit (INDEPENDENT_AMBULATORY_CARE_PROVIDER_SITE_OTHER): Payer: Medicare Other | Admitting: Psychology

## 2022-09-10 DIAGNOSIS — F329 Major depressive disorder, single episode, unspecified: Secondary | ICD-10-CM
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
## 2022-09-13 DIAGNOSIS — N8110 Cystocele, unspecified: Secondary | ICD-10-CM | POA: Diagnosis not present

## 2022-09-13 DIAGNOSIS — I10 Essential (primary) hypertension: Secondary | ICD-10-CM | POA: Diagnosis not present

## 2022-09-13 DIAGNOSIS — N361 Urethral diverticulum: Secondary | ICD-10-CM | POA: Diagnosis not present

## 2022-09-13 DIAGNOSIS — N390 Urinary tract infection, site not specified: Secondary | ICD-10-CM | POA: Diagnosis not present

## 2022-09-18 ENCOUNTER — Encounter: Payer: Self-pay | Admitting: Internal Medicine

## 2022-09-18 NOTE — Telephone Encounter (Signed)
Currently working on this for the patient

## 2022-09-24 ENCOUNTER — Ambulatory Visit (INDEPENDENT_AMBULATORY_CARE_PROVIDER_SITE_OTHER): Payer: Medicare Other | Admitting: Psychology

## 2022-09-24 DIAGNOSIS — F418 Other specified anxiety disorders: Secondary | ICD-10-CM

## 2022-09-24 DIAGNOSIS — F329 Major depressive disorder, single episode, unspecified: Secondary | ICD-10-CM
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
## 2022-09-26 ENCOUNTER — Ambulatory Visit (INDEPENDENT_AMBULATORY_CARE_PROVIDER_SITE_OTHER): Payer: Medicare Other

## 2022-09-26 ENCOUNTER — Telehealth: Payer: Self-pay | Admitting: Internal Medicine

## 2022-09-26 DIAGNOSIS — Z23 Encounter for immunization: Secondary | ICD-10-CM

## 2022-09-26 DIAGNOSIS — F418 Other specified anxiety disorders: Secondary | ICD-10-CM

## 2022-09-26 NOTE — Progress Notes (Signed)
After obtaining consent, and per orders of Dr. [name], injection of Flu shot was given in the right deltoid  shot and pneumonia was given in the left deltoid by Marrian Salvage. Patient instructed to me immediately.

## 2022-09-26 NOTE — Telephone Encounter (Signed)
Patient came in requesting a refill on two medications. She would like these medications to be sent to [place], [place] - [address].  The two medications are: busPIRone (BUSPAR) 7.5 MG tablet & escitalopram (LEXAPRO) 20 MG tablet .   Patient would like to be called once medications are called into the pharmacy. [phone].

## 2022-09-27 DIAGNOSIS — M5412 Radiculopathy, cervical region: Secondary | ICD-10-CM | POA: Diagnosis not present

## 2022-09-28 ENCOUNTER — Other Ambulatory Visit: Payer: Self-pay

## 2022-09-28 DIAGNOSIS — J4520 Mild intermittent asthma, uncomplicated: Secondary | ICD-10-CM

## 2022-09-28 MED ORDER — BUSPIRONE HCL 7.5 MG PO TABS: 7.5000 mg | ORAL_TABLET | Freq: Two times a day (BID) | ORAL | 2 refills | Status: DC

## 2022-09-28 MED ORDER — ESCITALOPRAM OXALATE 20 MG PO TABS: 20.0000 mg | ORAL_TABLET | Freq: Every day | ORAL | 3 refills | Status: DC

## 2022-09-28 MED ORDER — ALBUTEROL SULFATE (2.5 MG/3ML) 0.083% IN NEBU: 2.5000 mg | INHALATION_SOLUTION | RESPIRATORY_TRACT | 1 refills | Status: DC | PRN

## 2022-09-28 NOTE — Telephone Encounter (Signed)
Spoke with patient and informed her about her medication

## 2022-09-28 NOTE — Telephone Encounter (Signed)
Sent in

## 2022-10-03 ENCOUNTER — Ambulatory Visit (INDEPENDENT_AMBULATORY_CARE_PROVIDER_SITE_OTHER): Payer: Medicare Other | Admitting: Psychology

## 2022-10-03 DIAGNOSIS — F329 Major depressive disorder, single episode, unspecified: Secondary | ICD-10-CM | POA: Diagnosis not present

## 2022-10-03 DIAGNOSIS — F418 Other specified anxiety disorders: Secondary | ICD-10-CM
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
## 2022-10-08 ENCOUNTER — Ambulatory Visit (INDEPENDENT_AMBULATORY_CARE_PROVIDER_SITE_OTHER): Payer: Medicare Other | Admitting: Psychology

## 2022-10-08 DIAGNOSIS — F418 Other specified anxiety disorders: Secondary | ICD-10-CM | POA: Diagnosis not present

## 2022-10-08 DIAGNOSIS — F329 Major depressive disorder, single episode, unspecified: Secondary | ICD-10-CM | POA: Diagnosis not present
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
## 2022-10-10 DIAGNOSIS — M5412 Radiculopathy, cervical region: Secondary | ICD-10-CM | POA: Diagnosis not present

## 2022-10-17 DIAGNOSIS — I10 Essential (primary) hypertension: Secondary | ICD-10-CM | POA: Diagnosis not present

## 2022-10-17 DIAGNOSIS — Z1231 Encounter for screening mammogram for malignant neoplasm of breast: Secondary | ICD-10-CM | POA: Diagnosis not present

## 2022-10-17 DIAGNOSIS — C50911 Malignant neoplasm of unspecified site of right female breast: Secondary | ICD-10-CM | POA: Diagnosis not present

## 2022-10-18 ENCOUNTER — Ambulatory Visit: Payer: Medicare Other | Admitting: Psychology

## 2022-10-23 DIAGNOSIS — F17210 Nicotine dependence, cigarettes, uncomplicated: Secondary | ICD-10-CM | POA: Diagnosis not present

## 2022-10-23 DIAGNOSIS — C50911 Malignant neoplasm of unspecified site of right female breast: Secondary | ICD-10-CM | POA: Diagnosis not present

## 2022-10-23 DIAGNOSIS — Z1231 Encounter for screening mammogram for malignant neoplasm of breast: Secondary | ICD-10-CM | POA: Diagnosis not present

## 2022-10-23 DIAGNOSIS — I972 Postmastectomy lymphedema syndrome: Secondary | ICD-10-CM | POA: Diagnosis not present

## 2022-10-23 DIAGNOSIS — R232 Flushing: Secondary | ICD-10-CM | POA: Diagnosis not present

## 2022-10-23 DIAGNOSIS — Z5181 Encounter for therapeutic drug level monitoring: Secondary | ICD-10-CM | POA: Diagnosis not present

## 2022-10-23 DIAGNOSIS — T451X5A Adverse effect of antineoplastic and immunosuppressive drugs, initial encounter: Secondary | ICD-10-CM | POA: Diagnosis not present

## 2022-10-23 DIAGNOSIS — R92333 Mammographic heterogeneous density, bilateral breasts: Secondary | ICD-10-CM | POA: Diagnosis not present

## 2022-10-25 DIAGNOSIS — M5416 Radiculopathy, lumbar region: Secondary | ICD-10-CM | POA: Diagnosis not present

## 2022-11-01 ENCOUNTER — Ambulatory Visit (INDEPENDENT_AMBULATORY_CARE_PROVIDER_SITE_OTHER): Payer: Medicare Other | Admitting: Psychology

## 2022-11-01 DIAGNOSIS — F418 Other specified anxiety disorders: Secondary | ICD-10-CM | POA: Diagnosis not present

## 2022-11-01 DIAGNOSIS — F329 Major depressive disorder, single episode, unspecified: Secondary | ICD-10-CM | POA: Diagnosis not present
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
## 2022-11-02 DIAGNOSIS — R748 Abnormal levels of other serum enzymes: Secondary | ICD-10-CM | POA: Diagnosis not present

## 2022-11-02 DIAGNOSIS — R188 Other ascites: Secondary | ICD-10-CM | POA: Diagnosis not present

## 2022-11-02 DIAGNOSIS — Z9049 Acquired absence of other specified parts of digestive tract: Secondary | ICD-10-CM | POA: Diagnosis not present

## 2022-11-02 DIAGNOSIS — K449 Diaphragmatic hernia without obstruction or gangrene: Secondary | ICD-10-CM | POA: Diagnosis not present

## 2022-11-02 DIAGNOSIS — K5900 Constipation, unspecified: Secondary | ICD-10-CM | POA: Diagnosis not present

## 2022-11-02 DIAGNOSIS — M47814 Spondylosis without myelopathy or radiculopathy, thoracic region: Secondary | ICD-10-CM | POA: Diagnosis not present

## 2022-11-02 DIAGNOSIS — C50911 Malignant neoplasm of unspecified site of right female breast: Secondary | ICD-10-CM | POA: Diagnosis not present

## 2022-11-02 DIAGNOSIS — E041 Nontoxic single thyroid nodule: Secondary | ICD-10-CM | POA: Diagnosis not present

## 2022-11-02 DIAGNOSIS — M898X9 Other specified disorders of bone, unspecified site: Secondary | ICD-10-CM | POA: Diagnosis not present

## 2022-11-02 DIAGNOSIS — N361 Urethral diverticulum: Secondary | ICD-10-CM | POA: Diagnosis not present

## 2022-11-02 DIAGNOSIS — M47816 Spondylosis without myelopathy or radiculopathy, lumbar region: Secondary | ICD-10-CM | POA: Diagnosis not present

## 2022-11-14 DIAGNOSIS — N361 Urethral diverticulum: Secondary | ICD-10-CM | POA: Diagnosis not present

## 2022-11-15 DIAGNOSIS — G894 Chronic pain syndrome: Secondary | ICD-10-CM | POA: Diagnosis not present

## 2022-11-15 DIAGNOSIS — M4322 Fusion of spine, cervical region: Secondary | ICD-10-CM | POA: Diagnosis not present

## 2022-11-20 ENCOUNTER — Ambulatory Visit (INDEPENDENT_AMBULATORY_CARE_PROVIDER_SITE_OTHER): Payer: Medicare Other | Admitting: Gastroenterology

## 2022-11-20 ENCOUNTER — Encounter: Payer: Self-pay | Admitting: Gastroenterology

## 2022-11-20 VITALS — BP 128/72 | HR 91 | Ht 59.0 in | Wt 190.0 lb

## 2022-11-20 DIAGNOSIS — K921 Melena: Secondary | ICD-10-CM

## 2022-11-20 DIAGNOSIS — K219 Gastro-esophageal reflux disease without esophagitis: Secondary | ICD-10-CM

## 2022-11-20 DIAGNOSIS — K649 Unspecified hemorrhoids: Secondary | ICD-10-CM

## 2022-11-20 DIAGNOSIS — K5900 Constipation, unspecified: Secondary | ICD-10-CM

## 2022-11-20 MED ORDER — PLENVU 140 G PO SOLR: 1.0000 | Freq: Once | ORAL | 0 refills | Status: AC

## 2022-11-20 MED ORDER — PANTOPRAZOLE SODIUM 40 MG PO TBEC: 40.0000 mg | DELAYED_RELEASE_TABLET | Freq: Every day | ORAL | 3 refills | Status: DC

## 2022-11-20 MED ORDER — DICYCLOMINE HCL 20 MG PO TABS: 20.0000 mg | ORAL_TABLET | Freq: Four times a day (QID) | ORAL | 0 refills | Status: DC

## 2022-11-20 MED ORDER — HYDROCORTISONE (PERIANAL) 2.5 % EX CREA: TOPICAL_CREAM | CUTANEOUS | 0 refills | Status: DC

## 2022-11-20 NOTE — Patient Instructions (Signed)
_______________________________________________________  If you are age 52 or older, your body mass index should be between 23-30. Your Body mass index is 38.38 kg/m. If this is out of the aforementioned range listed, please consider follow up with your Primary Care Provider.  If you are age 52 or younger, your body mass index should be between 19-25. Your Body mass index is 38.38 kg/m. If this is out of the aformentioned range listed, please consider follow up with your Primary Care Provider.   ________________________________________________________  The [facility] providers would like to encourage you to use [facility] to communicate with providers for non-urgent requests or questions.  Due to long hold times on the telephone, sending your provider a message by [facility] may be a faster and more efficient way to get a response.  Please allow 48 business hours for a response.  Please remember that this is for non-urgent requests.  _______________________________________________________  We have sent the following medications to your pharmacy for you to pick up at your convenience:  Protonix, Dicyclomine, Hydrocortisone cream

## 2022-11-20 NOTE — Progress Notes (Addendum)
    Assessment     Hematochezia, prolapse secondary to grade 3 internal hemorrhoids.  Rule out colorectal neoplasms and other disorders. Constipation GERD Urethral diverticulum   Recommendations    Continue 2.5% hydrocortisone cream PR twice daily as needed and follow standard rectal care instructions.  Discussed the option of hemorrhoidal banding following colonoscopy. Schedule colonoscopy. The risks (including bleeding, perforation, infection, missed lesions, medication reactions and possible hospitalization or surgery if complications occur), benefits, and alternatives to colonoscopy with possible biopsy and possible polypectomy were discussed with the patient and they consent to proceed.   Continue MiraLAX daily as needed and dicyclomine 20 mg p.o. twice daily Continue pantoprazole 40 mg daily and follow antireflux measures Urethral diverticulectomy with possible martius flap and cystoscopy is planned in the near future   HPI    This is a 51 year old female with hematochezia, hemorrhoid prolapse.  She has had intermittent difficulties with constipation intermittent difficulties with abdominal bloating.  She notes intermittent hemorrhoid prolapse that sometimes requires manual reduction.  About 2 weeks ago she had 2 episodes of painless rectal bleeding with a bowel movement.  No bleeding since.  Colonoscopy Sept 2018 - Two 6 to 8 mm polyps in the sigmoid colon and in the transverse colon, removed with a cold snare. Resected and retrieved. (hyperplastic) - Internal hemorrhoids. - The examination was otherwise normal on direct and retroflexion views.   Labs / Imaging       Latest Ref Rng & Units 10/09/2021    3:10 PM 07/18/2020   12:25 PM 09/18/2019    2:00 PM  Hepatic Function  Total Protein 6.0 - 8.3 g/dL 7.8  7.8  7.1   Albumin 3.5 - 5.2 g/dL 4.4  4.4  3.8   AST 0 - 37 U/L _0 ALT 0 - 35 U/L _1 Alk Phosphatase 39 - 117 U/L 92  101  84   Total Bilirubin  0.2 - 1.2 mg/dL 0.5  0.6  0.6   Bilirubin, Direct 0.0 - 0.3 mg/dL  0.1         Latest Ref Rng & Units 10/09/2021    3:10 PM 07/18/2020   12:25 PM 09/18/2019    2:00 PM  CBC  WBC 4.0 - 10.5 K/uL 7.2  10.5  9.5   Hemoglobin 12.0 - 15.0 g/dL 14.1  14.0  13.3   Hematocrit 36.0 - 46.0 % 42.7  41.3  38.9   Platelets 150.0 - 400.0 K/uL 212.0  280.0  254     Current Medications, Allergies, Past Medical History, Past Surgical History, Family History and Social History were reviewed in [employer] record.   Physical Exam: General: Well developed, well nourished, no acute distress Head: Normocephalic and atraumatic Eyes: Sclerae anicteric, EOMI Ears: Normal auditory acuity Mouth: No deformities or lesions noted Lungs: Clear throughout to auscultation Heart: Regular rate and rhythm; No murmurs, rubs or bruits Abdomen: Soft, non tender and non distended. No masses, hepatosplenomegaly or hernias noted. Normal Bowel sounds Rectal: No lesions, no tenderness, Heme neg mucous (no stool). Julieanne Cotton, CMA present  Musculoskeletal: Symmetrical with no gross deformities  Pulses:  Normal pulses noted Extremities: No edema or deformities noted Neurological: Alert oriented x 4, grossly nonfocal Psychological:  Alert and cooperative. Normal mood and affect   Jolynn Bajorek T. Fuller Plan, MD 11/20/2022, 11:34 AM

## 2022-11-23 DIAGNOSIS — G8929 Other chronic pain: Secondary | ICD-10-CM | POA: Diagnosis not present

## 2022-11-23 DIAGNOSIS — M5412 Radiculopathy, cervical region: Secondary | ICD-10-CM | POA: Diagnosis not present

## 2022-11-23 DIAGNOSIS — Z79891 Long term (current) use of opiate analgesic: Secondary | ICD-10-CM | POA: Diagnosis not present

## 2022-11-26 ENCOUNTER — Other Ambulatory Visit: Payer: Self-pay | Admitting: Internal Medicine

## 2022-11-26 DIAGNOSIS — J4520 Mild intermittent asthma, uncomplicated: Secondary | ICD-10-CM

## 2022-11-26 DIAGNOSIS — J301 Allergic rhinitis due to pollen: Secondary | ICD-10-CM

## 2022-11-27 ENCOUNTER — Other Ambulatory Visit (HOSPITAL_COMMUNITY): Payer: Self-pay

## 2022-11-29 ENCOUNTER — Ambulatory Visit (INDEPENDENT_AMBULATORY_CARE_PROVIDER_SITE_OTHER): Payer: 59 | Admitting: Psychology

## 2022-11-29 ENCOUNTER — Other Ambulatory Visit: Payer: Self-pay | Admitting: Gastroenterology

## 2022-11-29 DIAGNOSIS — F329 Major depressive disorder, single episode, unspecified: Secondary | ICD-10-CM | POA: Diagnosis not present

## 2022-11-29 DIAGNOSIS — F418 Other specified anxiety disorders: Secondary | ICD-10-CM | POA: Diagnosis not present
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
## 2022-12-03 ENCOUNTER — Other Ambulatory Visit: Payer: Self-pay | Admitting: Gastroenterology

## 2022-12-04 ENCOUNTER — Other Ambulatory Visit: Payer: Self-pay | Admitting: Internal Medicine

## 2022-12-04 DIAGNOSIS — K219 Gastro-esophageal reflux disease without esophagitis: Secondary | ICD-10-CM

## 2022-12-04 NOTE — Telephone Encounter (Signed)
Ok to Pcp please

## 2022-12-12 ENCOUNTER — Encounter: Payer: Self-pay | Admitting: Gastroenterology

## 2022-12-13 ENCOUNTER — Ambulatory Visit (INDEPENDENT_AMBULATORY_CARE_PROVIDER_SITE_OTHER): Payer: 59 | Admitting: Psychology

## 2022-12-13 DIAGNOSIS — F329 Major depressive disorder, single episode, unspecified: Secondary | ICD-10-CM

## 2022-12-13 DIAGNOSIS — F418 Other specified anxiety disorders: Secondary | ICD-10-CM | POA: Diagnosis not present
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
## 2022-12-14 ENCOUNTER — Telehealth: Payer: Self-pay | Admitting: Gastroenterology

## 2022-12-14 ENCOUNTER — Telehealth: Payer: Self-pay | Admitting: Internal Medicine

## 2022-12-14 NOTE — Telephone Encounter (Signed)
Patient is unable to attend jury duty

## 2022-12-14 NOTE — Telephone Encounter (Signed)
Inbound call from patient stating that she just got a call from her pharmacy stating that her insurance will not cover Plevnvu. Patient is scheduled to have procedure on 1/31 and is requesting an alternative prep be sent to [facility] on [address]. Please advise.

## 2022-12-17 NOTE — Telephone Encounter (Signed)
Patient called to follow up on previous message. Please advise.   

## 2022-12-17 NOTE — Telephone Encounter (Signed)
Spoke with patient and told her I would put a Plenvu sample up front for her.  Patient agreed.

## 2022-12-18 ENCOUNTER — Telehealth: Payer: Self-pay | Admitting: Gastroenterology

## 2022-12-18 DIAGNOSIS — N6091 Unspecified benign mammary dysplasia of right breast: Secondary | ICD-10-CM | POA: Diagnosis not present

## 2022-12-18 DIAGNOSIS — Q0701 Arnold-Chiari syndrome with spina bifida: Secondary | ICD-10-CM | POA: Diagnosis not present

## 2022-12-18 DIAGNOSIS — C50911 Malignant neoplasm of unspecified site of right female breast: Secondary | ICD-10-CM | POA: Diagnosis not present

## 2022-12-18 DIAGNOSIS — J410 Simple chronic bronchitis: Secondary | ICD-10-CM | POA: Diagnosis not present

## 2022-12-18 NOTE — Telephone Encounter (Signed)
Returned pts call.  Advised her that she is ok to have procedure tomorrow.

## 2022-12-18 NOTE — Telephone Encounter (Signed)
Inbound call from patient stating that she is scheduled to have a colonoscopy tomorrow at 1:30 with Dr. Fuller Plan and she was reading over her instructions and stated that she had beans yesterday morning. Patient is requesting a call back to discuss if she can still have procedure. Please advise.

## 2022-12-19 ENCOUNTER — Ambulatory Visit: Payer: Medicare Other | Admitting: Internal Medicine

## 2022-12-19 ENCOUNTER — Encounter: Payer: Self-pay | Admitting: Gastroenterology

## 2022-12-19 ENCOUNTER — Ambulatory Visit (AMBULATORY_SURGERY_CENTER): Payer: 59 | Admitting: Gastroenterology

## 2022-12-19 VITALS — BP 112/71 | HR 84 | Temp 98.0°F | Resp 13 | Ht 59.0 in | Wt 190.0 lb

## 2022-12-19 DIAGNOSIS — K635 Polyp of colon: Secondary | ICD-10-CM | POA: Diagnosis not present

## 2022-12-19 DIAGNOSIS — D124 Benign neoplasm of descending colon: Secondary | ICD-10-CM

## 2022-12-19 DIAGNOSIS — D125 Benign neoplasm of sigmoid colon: Secondary | ICD-10-CM | POA: Diagnosis not present

## 2022-12-19 DIAGNOSIS — K921 Melena: Secondary | ICD-10-CM

## 2022-12-19 MED ORDER — SODIUM CHLORIDE 0.9 % IV SOLN: 500.0000 mL | Freq: Once | INTRAVENOUS | Status: DC

## 2022-12-19 NOTE — Op Note (Addendum)
[place] Patient Name: Emily [name] Procedure Date: 12/19/2022 1:26 PM MRN: [id number] Endoscopist: Ladene Artist , MD, [id number] Age: 52 Referring MD:  Date of Birth: 1971-04-21 Gender: Female Account #: [id number] Procedure:                Colonoscopy Indications:              Hematochezia Medicines:                Monitored Anesthesia Care Procedure:                Pre-Anesthesia Assessment:                           - Prior to the procedure, a History and Physical                            was performed, and patient medications and                            allergies were reviewed. The patient's tolerance of                            previous anesthesia was also reviewed. The risks                            and benefits of the procedure and the sedation                            options and risks were discussed with the patient.                            All questions were answered, and informed consent                            was obtained. Prior Anticoagulants: The patient has                            taken no anticoagulant or antiplatelet agents. ASA                            Grade Assessment: III - A patient with severe                            systemic disease. After reviewing the risks and                            benefits, the patient was deemed in satisfactory                            condition to undergo the procedure.                           After obtaining informed consent, the colonoscope  was passed under direct vision. Throughout the                            procedure, the patient's blood pressure, pulse, and                            oxygen saturations were monitored continuously. The                            CF [id number] #[id number] was introduced through the anus                            and advanced to the the cecum, identified by                            appendiceal orifice and ileocecal valve.  The                            ileocecal valve, appendiceal orifice, and rectum                            were photographed. The quality of the bowel                            preparation was good. The colonoscopy was performed                            without difficulty. The patient tolerated the                            procedure well. Scope In: 1:30:59 PM Scope Out: 1:46:02 PM Scope Withdrawal Time: 0 hours 12 minutes 13 seconds  Total Procedure Duration: 0 hours 15 minutes 3 seconds  Findings:                 Skin tags were found on perianal exam.                           Five sessile polyps were found in the sigmoid colon                            (3) and descending colon (2). The polyps were 5 to                            7 mm in size. These polyps were removed with a cold                            snare. Resection and retrieval were complete.                           Internal hemorrhoids were found during                            retroflexion. The hemorrhoids were moderate and  Grade III (internal hemorrhoids that prolapse but                            require manual reduction).                           The exam was otherwise without abnormality on                            direct and retroflexion views. Complications:            No immediate complications. Estimated blood loss:                            None. Estimated Blood Loss:     Estimated blood loss: none. Impression:               - Perianal skin tags found on perianal exam.                           - Five 5 to 7 mm polyps in the sigmoid colon and in                            the descending colon, removed with a cold snare.                            Resected and retrieved.                           - Internal hemorrhoids.                           - The examination was otherwise normal on direct                            and retroflexion views. Recommendation:           -  Repeat colonoscopy after studies are complete for                            surveillance based on pathology results.                           - Patient has a contact number available for                            emergencies. The signs and symptoms of potential                            delayed complications were discussed with the                            patient. Return to normal activities tomorrow.                            Written discharge instructions were provided to the  patient.                           - Resume previous diet.                           - Continue present medications.                           - Await pathology results.                           - Schedule hemorrhoid banding. Ladene Artist, MD 12/19/2022 1:50:05 PM This report has been signed electronically.

## 2022-12-19 NOTE — Progress Notes (Signed)
Report to pacu rn. Vss. Care resumed by rn. 

## 2022-12-19 NOTE — Progress Notes (Signed)
Pt's states no medical or surgical changes since previsit or office visit. 

## 2022-12-19 NOTE — Progress Notes (Signed)
Called to room to assist during endoscopic procedure.  Patient ID and intended procedure confirmed with present staff. Received instructions for my participation in the procedure from the performing physician.  

## 2022-12-19 NOTE — Patient Instructions (Signed)
Please read handouts provided. Continue present medications. Await pathology results. Schedule hemorrhoid banding.   YOU HAD AN ENDOSCOPIC PROCEDURE TODAY AT [facility]:   Refer to the procedure report that was given to you for any specific questions about what was found during the examination.  If the procedure report does not answer your questions, please call your gastroenterologist to clarify.  If you requested that your care partner not be given the details of your procedure findings, then the procedure report has been included in a sealed envelope for you to review at your convenience later.  YOU SHOULD EXPECT: Some feelings of bloating in the abdomen. Passage of more gas than usual.  Walking can help get rid of the air that was put into your GI tract during the procedure and reduce the bloating. If you had a lower endoscopy (such as a colonoscopy or flexible sigmoidoscopy) you may notice spotting of blood in your stool or on the toilet paper. If you underwent a bowel prep for your procedure, you may not have a normal bowel movement for a few days.  Please Note:  You might notice some irritation and congestion in your nose or some drainage.  This is from the oxygen used during your procedure.  There is no need for concern and it should clear up in a day or so.  SYMPTOMS TO REPORT IMMEDIATELY:  Following lower endoscopy (colonoscopy or flexible sigmoidoscopy):  Excessive amounts of blood in the stool  Significant tenderness or worsening of abdominal pains  Swelling of the abdomen that is new, acute  Fever of 100F or higher  For urgent or emergent issues, a gastroenterologist can be reached at any hour by calling [phone]. Do not use MyChart messaging for urgent concerns.    DIET:  We do recommend a small meal at first, but then you may proceed to your regular diet.  Drink plenty of fluids but you should avoid alcoholic beverages for 24 hours.  ACTIVITY:  You  should plan to take it easy for the rest of today and you should NOT DRIVE or use heavy machinery until tomorrow (because of the sedation medicines used during the test).    FOLLOW UP: Our staff will call the number listed on your records the next business day following your procedure.  We will call around 7:15- 8:00 am to check on you and address any questions or concerns that you may have regarding the information given to you following your procedure. If we do not reach you, we will leave a message.     If any biopsies were taken you will be contacted by phone or by letter within the next 1-3 weeks.  Please call us at [phone] if you have not heard about the biopsies in 3 weeks.    SIGNATURES/CONFIDENTIALITY: You and/or your care partner have signed paperwork which will be entered into your electronic medical record.  These signatures attest to the fact that that the information above on your After Visit Summary has been reviewed and is understood.  Full responsibility of the confidentiality of this discharge information lies with you and/or your care-partner.

## 2022-12-19 NOTE — Progress Notes (Signed)
See 11/20/2022 H&P, no changes

## 2022-12-20 ENCOUNTER — Telehealth: Payer: Self-pay | Admitting: *Deleted

## 2022-12-20 ENCOUNTER — Other Ambulatory Visit: Payer: Self-pay

## 2022-12-20 NOTE — Telephone Encounter (Signed)
  Follow up Call-     12/19/2022    1:13 PM 11/22/2021    9:47 AM  Call back number  Post procedure Call Back phone  # [phone]  Permission to leave phone message Yes Yes     Patient questions:  Do you have a fever, pain , or abdominal swelling? No. Pain Score  0 *  Have you tolerated food without any problems? Yes.    Have you been able to return to your normal activities? Yes.    Do you have any questions about your discharge instructions: Diet   No. Medications  No. Follow up visit  No.  Do you have questions or concerns about your Care? No.  Actions: * If pain score is 4 or above: No action needed, pain <4.

## 2022-12-21 ENCOUNTER — Ambulatory Visit: Payer: 59 | Admitting: Internal Medicine

## 2022-12-24 ENCOUNTER — Encounter: Payer: Self-pay | Admitting: Internal Medicine

## 2022-12-24 ENCOUNTER — Ambulatory Visit (INDEPENDENT_AMBULATORY_CARE_PROVIDER_SITE_OTHER): Payer: 59 | Admitting: Internal Medicine

## 2022-12-24 VITALS — BP 120/80 | HR 90 | Temp 98.2°F | Ht 59.0 in | Wt 187.4 lb

## 2022-12-24 DIAGNOSIS — R3129 Other microscopic hematuria: Secondary | ICD-10-CM | POA: Insufficient documentation

## 2022-12-24 DIAGNOSIS — J410 Simple chronic bronchitis: Secondary | ICD-10-CM

## 2022-12-24 DIAGNOSIS — J4520 Mild intermittent asthma, uncomplicated: Secondary | ICD-10-CM

## 2022-12-24 DIAGNOSIS — I10 Essential (primary) hypertension: Secondary | ICD-10-CM | POA: Diagnosis not present

## 2022-12-24 DIAGNOSIS — D509 Iron deficiency anemia, unspecified: Secondary | ICD-10-CM | POA: Diagnosis not present

## 2022-12-24 DIAGNOSIS — R739 Hyperglycemia, unspecified: Secondary | ICD-10-CM | POA: Diagnosis not present

## 2022-12-24 LAB — URINALYSIS, ROUTINE W REFLEX MICROSCOPIC
Bilirubin Urine: NEGATIVE
Hgb urine dipstick: NEGATIVE
Ketones, ur: NEGATIVE
Leukocytes,Ua: NEGATIVE
Nitrite: NEGATIVE
Specific Gravity, Urine: 1.02 (ref 1.000–1.030)
Total Protein, Urine: NEGATIVE
Urine Glucose: NEGATIVE
Urobilinogen, UA: 0.2 (ref 0.0–1.0)
pH: 7 (ref 5.0–8.0)

## 2022-12-24 LAB — COMPREHENSIVE METABOLIC PANEL
ALT: 14 U/L (ref 0–35)
AST: 16 U/L (ref 0–37)
Albumin: 4.4 g/dL (ref 3.5–5.2)
Alkaline Phosphatase: 133 U/L — ABNORMAL HIGH (ref 39–117)
BUN: 5 mg/dL — ABNORMAL LOW (ref 6–23)
CO2: 28 mEq/L (ref 19–32)
Calcium: 9.5 mg/dL (ref 8.4–10.5)
Chloride: 103 mEq/L (ref 96–112)
Creatinine, Ser: 0.7 mg/dL (ref 0.40–1.20)
GFR: 99.98 mL/min (ref >60.00)
Glucose, Bld: 104 mg/dL — ABNORMAL HIGH (ref 70–99)
Potassium: 3.9 mEq/L (ref 3.5–5.1)
Sodium: 138 mEq/L (ref 135–145)
Total Bilirubin: 0.5 mg/dL (ref 0.2–1.2)
Total Protein: 7.7 g/dL (ref 6.0–8.3)

## 2022-12-24 LAB — LIPID PANEL
Cholesterol: 173 mg/dL (ref 0–200)
HDL: 30.9 mg/dL — ABNORMAL LOW (ref >39.00)
LDL Cholesterol: 111 mg/dL — ABNORMAL HIGH (ref 0–99)
NonHDL: 141.95
Total CHOL/HDL Ratio: 6
Triglycerides: 154 mg/dL — ABNORMAL HIGH (ref 0.0–149.0)
VLDL: 30.8 mg/dL (ref 0.0–40.0)

## 2022-12-24 LAB — HEMOGLOBIN A1C: Hgb A1c MFr Bld: 5.7 % (ref 4.6–6.5)

## 2022-12-24 NOTE — Assessment & Plan Note (Signed)
Counseled to quit and reminded about risk/harm from smoking.

## 2022-12-24 NOTE — Patient Instructions (Addendum)
We will check on the labs today.  Work on quitting smoking.

## 2022-12-24 NOTE — Assessment & Plan Note (Signed)
BMI 37 and complicated by hypertension, depression, hyperglycemia. Counseled about diet/exercise.

## 2022-12-24 NOTE — Assessment & Plan Note (Signed)
She is using albuterol more due to winter. Needs new nebulizer as her tubing and cords are damaged. Rx done for nebulizer and she uses her albuterol nebulizer solution and inhaler and flovent 2 puffs BID. Continue allergy medications as well. No flare today. Mild intermittent.

## 2022-12-24 NOTE — Assessment & Plan Note (Signed)
Recent CBC stable. Has had hysterectomy and getting hemorrhoid banding soon which should help.

## 2022-12-24 NOTE — Assessment & Plan Note (Signed)
Checking U/A and urine culture. Getting cystoscopy soon. Is high risk due to smoking.

## 2022-12-24 NOTE — Assessment & Plan Note (Signed)
Checking HgA1c and adjust as needed.  

## 2022-12-24 NOTE — Assessment & Plan Note (Signed)
Checking CMP and lipid panel and adjust as needed. No meds currently.

## 2022-12-24 NOTE — Progress Notes (Signed)
   Subjective:   [name], female    DOB: 12-20-1970, 52 y.o.   MRN: [id number]  Urinary Tract Infection  Associated symptoms include hematuria. Pertinent negatives include no nausea or vomiting.   The patient is a 52 YO female coming in for follow up.   Review of Systems  Constitutional: Negative.   HENT: Negative.    Eyes: Negative.   Respiratory:  Negative for cough, chest tightness and shortness of breath.   Cardiovascular:  Negative for chest pain, palpitations and leg swelling.  Gastrointestinal:  Negative for abdominal distention, abdominal pain, constipation, diarrhea, nausea and vomiting.  Genitourinary:  Positive for hematuria.  Musculoskeletal:  Positive for arthralgias, back pain and neck pain.  Skin: Negative.   Neurological: Negative.   Psychiatric/Behavioral: Negative.      Objective:  Physical Exam Constitutional:      Appearance: She is well-developed.  HENT:     Head: Normocephalic and atraumatic.  Cardiovascular:     Rate and Rhythm: Normal rate and regular rhythm.  Pulmonary:     Effort: Pulmonary effort is normal. No respiratory distress.     Breath sounds: Normal breath sounds. No wheezing or rales.  Abdominal:     General: Bowel sounds are normal. There is no distension.     Palpations: Abdomen is soft.     Tenderness: There is no abdominal tenderness. There is no rebound.  Musculoskeletal:        General: Tenderness present.     Cervical back: Normal range of motion.  Skin:    General: Skin is warm and dry.  Neurological:     Mental Status: She is alert and oriented to person, place, and time.     Coordination: Coordination normal.     Vitals:   12/24/22 1104  BP: 120/80  Pulse: 90  Temp: 98.2 F (36.8 C)  TempSrc: Oral  SpO2: 98%  Weight: 187 lb 6 oz (85 kg)  Height: '4\' 11"'$  (1.499 m)    Assessment & Plan:

## 2022-12-25 LAB — URINE CULTURE

## 2022-12-27 ENCOUNTER — Encounter: Payer: 59 | Admitting: Psychology

## 2022-12-27 NOTE — Progress Notes (Signed)
This encounter was created in error - please disregard.

## 2022-12-31 DIAGNOSIS — N361 Urethral diverticulum: Secondary | ICD-10-CM | POA: Diagnosis not present

## 2023-01-03 ENCOUNTER — Encounter: Payer: Self-pay | Admitting: Gastroenterology

## 2023-01-04 ENCOUNTER — Ambulatory Visit: Payer: 59 | Admitting: Psychology

## 2023-01-04 DIAGNOSIS — J45909 Unspecified asthma, uncomplicated: Secondary | ICD-10-CM | POA: Diagnosis not present

## 2023-01-04 DIAGNOSIS — K219 Gastro-esophageal reflux disease without esophagitis: Secondary | ICD-10-CM | POA: Diagnosis not present

## 2023-01-04 DIAGNOSIS — F17210 Nicotine dependence, cigarettes, uncomplicated: Secondary | ICD-10-CM | POA: Diagnosis not present

## 2023-01-04 DIAGNOSIS — N361 Urethral diverticulum: Secondary | ICD-10-CM | POA: Diagnosis not present

## 2023-01-04 DIAGNOSIS — I10 Essential (primary) hypertension: Secondary | ICD-10-CM | POA: Diagnosis not present

## 2023-01-07 ENCOUNTER — Telehealth: Payer: Self-pay | Admitting: Gastroenterology

## 2023-01-07 DIAGNOSIS — Z79891 Long term (current) use of opiate analgesic: Secondary | ICD-10-CM | POA: Diagnosis not present

## 2023-01-07 DIAGNOSIS — M5416 Radiculopathy, lumbar region: Secondary | ICD-10-CM | POA: Diagnosis not present

## 2023-01-07 DIAGNOSIS — M545 Low back pain, unspecified: Secondary | ICD-10-CM | POA: Diagnosis not present

## 2023-01-07 DIAGNOSIS — G8929 Other chronic pain: Secondary | ICD-10-CM | POA: Diagnosis not present

## 2023-01-07 NOTE — Telephone Encounter (Signed)
The pt has been advised to call her surgeon for advise. She will call and let us know how she wishes to proceed

## 2023-01-07 NOTE — Telephone Encounter (Signed)
She needs advice from the surgeon performing the diverticulectomy on when we can resume banding.

## 2023-01-07 NOTE — Telephone Encounter (Signed)
Inbound call from patient stating that she is scheduled to have a hem band on 2/26 and is wanting to know if she can still come for the appointment. Patient stated that she had a Urethral diverticulectomy and has to wear a cath for 2 weeks. Please advise.

## 2023-01-07 NOTE — Telephone Encounter (Signed)
Dr Fuller Plan can you please advise if the pt can proceed with banding on 2/26 d/t urethral diverticulectomy procedure on 2/16.

## 2023-01-08 DIAGNOSIS — J4520 Mild intermittent asthma, uncomplicated: Secondary | ICD-10-CM | POA: Diagnosis not present

## 2023-01-11 ENCOUNTER — Ambulatory Visit (INDEPENDENT_AMBULATORY_CARE_PROVIDER_SITE_OTHER): Payer: 59 | Admitting: Psychology

## 2023-01-11 DIAGNOSIS — F418 Other specified anxiety disorders: Secondary | ICD-10-CM | POA: Diagnosis not present

## 2023-01-11 DIAGNOSIS — F329 Major depressive disorder, single episode, unspecified: Secondary | ICD-10-CM
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
## 2023-01-14 ENCOUNTER — Encounter: Payer: 59 | Admitting: Gastroenterology

## 2023-01-18 DIAGNOSIS — J4520 Mild intermittent asthma, uncomplicated: Secondary | ICD-10-CM | POA: Diagnosis not present

## 2023-01-23 ENCOUNTER — Ambulatory Visit: Payer: 59 | Admitting: Psychology

## 2023-01-25 ENCOUNTER — Ambulatory Visit (INDEPENDENT_AMBULATORY_CARE_PROVIDER_SITE_OTHER): Payer: 59 | Admitting: Psychology

## 2023-01-25 DIAGNOSIS — F418 Other specified anxiety disorders: Secondary | ICD-10-CM

## 2023-01-25 DIAGNOSIS — F329 Major depressive disorder, single episode, unspecified: Secondary | ICD-10-CM | POA: Diagnosis not present
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
## 2023-02-04 ENCOUNTER — Encounter: Payer: Self-pay | Admitting: Gastroenterology

## 2023-02-04 ENCOUNTER — Ambulatory Visit (INDEPENDENT_AMBULATORY_CARE_PROVIDER_SITE_OTHER): Payer: 59 | Admitting: Gastroenterology

## 2023-02-04 VITALS — BP 124/76 | HR 96 | Ht 59.75 in | Wt 194.2 lb

## 2023-02-04 DIAGNOSIS — G8929 Other chronic pain: Secondary | ICD-10-CM | POA: Diagnosis not present

## 2023-02-04 DIAGNOSIS — M5416 Radiculopathy, lumbar region: Secondary | ICD-10-CM | POA: Diagnosis not present

## 2023-02-04 DIAGNOSIS — K642 Third degree hemorrhoids: Secondary | ICD-10-CM | POA: Diagnosis not present

## 2023-02-04 DIAGNOSIS — Z79891 Long term (current) use of opiate analgesic: Secondary | ICD-10-CM | POA: Diagnosis not present

## 2023-02-04 NOTE — Patient Instructions (Addendum)
HEMORRHOID BANDING PROCEDURE    FOLLOW-UP CARE   The procedure you have had should have been relatively painless since the banding of the area involved does not have nerve endings and there is no pain sensation.  The rubber band cuts off the blood supply to the hemorrhoid and the band may fall off as soon as 48 hours after the banding (the band may occasionally be seen in the toilet bowl following a bowel movement). You may notice a temporary feeling of fullness in the rectum which should respond adequately to plain Tylenol or Motrin.  Following the banding, avoid strenuous exercise that evening and resume full activity the next day.  A sitz bath (soaking in a warm tub) or bidet is soothing, and can be useful for cleansing the area after bowel movements.     To avoid constipation, take two tablespoons of natural wheat bran, natural oat bran, flax, Benefiber or any over the counter fiber supplement and increase your water intake to 7-8 glasses daily.    Unless you have been prescribed anorectal medication, do not put anything inside your rectum for two weeks: No suppositories, enemas, fingers, etc.  Occasionally, you may have more bleeding than usual after the banding procedure.  This is often from the untreated hemorrhoids rather than the treated one.  Don't be concerned if there is a tablespoon or so of blood.  If there is more blood than this, lie flat with your bottom higher than your head and apply an ice pack to the area. If the bleeding does not stop within a half an hour or if you feel faint, call our office at [phone] or go to the emergency room.  Problems are not common; however, if there is a substantial amount of bleeding, severe pain, chills, fever or difficulty passing urine (very rare) or other problems, you should call us at [phone] or report to the nearest emergency room.  Do not stay seated continuously for more than 2-3 hours for a day or two after the procedure.   Tighten your buttock muscles 10-15 times every two hours and take 10-15 deep breaths every 1-2 hours.  Do not spend more than a few minutes on the toilet if you cannot empty your bowel; instead re-visit the toilet at a later time.   We have scheduled your for a second hem banding on 03/05/2023 at 3:40pm with Dr Fuller Plan.  ______________________________________________________  If your blood pressure at your visit was 140/90 or greater, please contact your primary care physician to follow up on this.  _______________________________________________________  If you are age 52 or older, your body mass index should be between 23-30. Your Body mass index is 38.25 kg/m. If this is out of the aforementioned range listed, please consider follow up with your Primary Care Provider.  If you are age 52 or younger, your body mass index should be between 19-25. Your Body mass index is 38.25 kg/m. If this is out of the aformentioned range listed, please consider follow up with your Primary Care Provider.   ________________________________________________________  The [facility] providers would like to encourage you to use [facility] to communicate with providers for non-urgent requests or questions.  Due to long hold times on the telephone, sending your provider a message by [facility] may be a faster and more efficient way to get a response.  Please allow 48 business hours for a response.  Please remember that this is for non-urgent requests.  _______________________________________________________ Thank you for choosing  me and [facility].  Pricilla Riffle. Dagoberto Ligas., MD., [facility]

## 2023-02-04 NOTE — Progress Notes (Signed)
PROCEDURE NOTE: The patient presents with symptomatic grade III hemorrhoids, requesting rubber band ligation of their hemorrhoidal disease.  All risks, benefits and alternative forms of therapy were described and informed consent was obtained.  The anorectum was pre-medicated during digital exam with 0.125% NTG and 5% lidocaine. In the Left Lateral Decubitus position anoscopic examination revealed grade III hemorrhoids in the RA > RP > LL positions. External skin tags noted. LL hemorrhoid had a small thrombosis and had mild tenderness.   The decision was made to band the RA internal hemorrhoid, and the [place] was used to perform band ligation without complication.  Digital anorectal examination was then performed to assure proper positioning of the band and to adjust the banded tissue as required. No adjustment was needed.  No complications were encountered and the patient tolerated the procedure well.  Dietary and behavioral recommendations were given and along with follow-up instructions.   Return for possible additional hemorrhoid banding in 2 - 3 weeks as required.  High fiber diet, Benefiber daily and at least 8 glasses of water daily. The patient was discharged home without pain or other post procedure problems.

## 2023-02-05 ENCOUNTER — Telehealth: Payer: Self-pay | Admitting: Gastroenterology

## 2023-02-05 NOTE — Telephone Encounter (Signed)
Patient is calling states she is having problems with her external hemorrhoids wishing to speak with a nurse. Please advise

## 2023-02-05 NOTE — Telephone Encounter (Signed)
The pt has been advised of the additional recommendations per Dr Fuller Plan.  She will let us know if she does not improve.

## 2023-02-05 NOTE — Telephone Encounter (Signed)
In addition use Tylenol or Advil for anal/rectal pain and RectiCare OTC PR topically tid prn for anal/rectal pain.

## 2023-02-05 NOTE — Telephone Encounter (Signed)
The pt states that she had a hemorrhoid banding yesterday see note from note below;    (External skin tags noted. LL hemorrhoid had a small thrombosis and had mild tenderness).   She tells me that she did really well until this morning when she says her external hemorrhoids started to cause a lot of pain.  She has hydrocortisone 2.5 rectal cream on hand that she has started as of this morning.  She will continue to use twice daily.  She will keep her bowels soft. I have also advised her to do Sitz baths as much as she is able.   Dr Fuller Plan please advise any further reqs.

## 2023-02-12 DIAGNOSIS — C50911 Malignant neoplasm of unspecified site of right female breast: Secondary | ICD-10-CM | POA: Diagnosis not present

## 2023-02-12 DIAGNOSIS — N6091 Unspecified benign mammary dysplasia of right breast: Secondary | ICD-10-CM | POA: Diagnosis not present

## 2023-02-14 DIAGNOSIS — Z09 Encounter for follow-up examination after completed treatment for conditions other than malignant neoplasm: Secondary | ICD-10-CM | POA: Diagnosis not present

## 2023-02-18 DIAGNOSIS — J4520 Mild intermittent asthma, uncomplicated: Secondary | ICD-10-CM | POA: Diagnosis not present

## 2023-02-22 ENCOUNTER — Ambulatory Visit: Payer: 59 | Admitting: Psychology

## 2023-02-25 ENCOUNTER — Other Ambulatory Visit: Payer: Self-pay | Admitting: Internal Medicine

## 2023-02-25 DIAGNOSIS — J301 Allergic rhinitis due to pollen: Secondary | ICD-10-CM

## 2023-02-25 DIAGNOSIS — J4520 Mild intermittent asthma, uncomplicated: Secondary | ICD-10-CM

## 2023-02-28 ENCOUNTER — Other Ambulatory Visit: Payer: Self-pay | Admitting: Gastroenterology

## 2023-02-28 DIAGNOSIS — N8111 Cystocele, midline: Secondary | ICD-10-CM | POA: Diagnosis not present

## 2023-03-04 DIAGNOSIS — G8929 Other chronic pain: Secondary | ICD-10-CM | POA: Diagnosis not present

## 2023-03-04 DIAGNOSIS — Z79891 Long term (current) use of opiate analgesic: Secondary | ICD-10-CM | POA: Diagnosis not present

## 2023-03-04 DIAGNOSIS — M5416 Radiculopathy, lumbar region: Secondary | ICD-10-CM | POA: Diagnosis not present

## 2023-03-05 ENCOUNTER — Ambulatory Visit (INDEPENDENT_AMBULATORY_CARE_PROVIDER_SITE_OTHER): Payer: 59 | Admitting: Gastroenterology

## 2023-03-05 ENCOUNTER — Encounter: Payer: Self-pay | Admitting: Gastroenterology

## 2023-03-05 VITALS — BP 124/84 | HR 90 | Ht 59.0 in

## 2023-03-05 DIAGNOSIS — K642 Third degree hemorrhoids: Secondary | ICD-10-CM

## 2023-03-05 NOTE — Patient Instructions (Signed)
HEMORRHOID BANDING PROCEDURE    FOLLOW-UP CARE   The procedure you have had should have been relatively painless since the banding of the area involved does not have nerve endings and there is no pain sensation.  The rubber band cuts off the blood supply to the hemorrhoid and the band may fall off as soon as 48 hours after the banding (the band may occasionally be seen in the toilet bowl following a bowel movement). You may notice a temporary feeling of fullness in the rectum which should respond adequately to plain Tylenol or Motrin.  Following the banding, avoid strenuous exercise that evening and resume full activity the next day.  A sitz bath (soaking in a warm tub) or bidet is soothing, and can be useful for cleansing the area after bowel movements.     To avoid constipation, take two tablespoons of natural wheat bran, natural oat bran, flax, Benefiber or any over the counter fiber supplement and increase your water intake to 7-8 glasses daily.    Unless you have been prescribed anorectal medication, do not put anything inside your rectum for two weeks: No suppositories, enemas, fingers, etc.  Occasionally, you may have more bleeding than usual after the banding procedure.  This is often from the untreated hemorrhoids rather than the treated one.  Don't be concerned if there is a tablespoon or so of blood.  If there is more blood than this, lie flat with your bottom higher than your head and apply an ice pack to the area. If the bleeding does not stop within a half an hour or if you feel faint, call our office at [phone] or go to the emergency room.  Problems are not common; however, if there is a substantial amount of bleeding, severe pain, chills, fever or difficulty passing urine (very rare) or other problems, you should call us at [phone] or report to the nearest emergency room.  Do not stay seated continuously for more than 2-3 hours for a day or two after the procedure.   Tighten your buttock muscles 10-15 times every two hours and take 10-15 deep breaths every 1-2 hours.  Do not spend more than a few minutes on the toilet if you cannot empty your bowel; instead re-visit the toilet at a later time.    Thank you for choosing me and [facility].  Malcolm T. Stark, Jr., MD., FACG  

## 2023-03-05 NOTE — Progress Notes (Signed)
PROCEDURE NOTE: The patient presents with symptomatic grade III hemorrhoids, requesting rubber band ligation of their hemorrhoidal disease.  All risks, benefits and alternative forms of therapy were described and informed consent was obtained.   The anorectum was pre-medicated during digital exam with 0.125% NTG and 5% lidocaine. External skin tags noted. Prior LL thrombosed hemorrhoid has resolved.    The decision was made to band the RP internal hemorrhoid, and the [facility] O'Regan System was used to perform band ligation without complication.  Digital anorectal examination was then performed to assure proper positioning of the band and to adjust the banded tissue as required. No adjustment was needed.  No complications were encountered and the patient tolerated the procedure well.  Dietary and behavioral recommendations were given and along with follow-up instructions.   Return for possible additional hemorrhoid banding in 2 - 3 weeks as required.  High fiber diet, Benefiber daily and at least 8 glasses of water daily. The patient was discharged home without pain or other post procedure problems.

## 2023-03-11 ENCOUNTER — Other Ambulatory Visit: Payer: Self-pay | Admitting: Gastroenterology

## 2023-03-18 ENCOUNTER — Ambulatory Visit (INDEPENDENT_AMBULATORY_CARE_PROVIDER_SITE_OTHER): Payer: 59 | Admitting: Psychology

## 2023-03-18 DIAGNOSIS — F418 Other specified anxiety disorders: Secondary | ICD-10-CM | POA: Diagnosis not present

## 2023-03-18 DIAGNOSIS — F329 Major depressive disorder, single episode, unspecified: Secondary | ICD-10-CM | POA: Diagnosis not present
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
## 2023-03-20 DIAGNOSIS — J4520 Mild intermittent asthma, uncomplicated: Secondary | ICD-10-CM | POA: Diagnosis not present

## 2023-03-26 ENCOUNTER — Telehealth: Payer: Self-pay | Admitting: Internal Medicine

## 2023-03-26 ENCOUNTER — Encounter: Payer: 59 | Admitting: Gastroenterology

## 2023-03-26 DIAGNOSIS — J4520 Mild intermittent asthma, uncomplicated: Secondary | ICD-10-CM

## 2023-03-26 NOTE — Telephone Encounter (Signed)
Prescription Request  03/26/2023  LOV: 12/24/2022 NOV: 05/27/23  What is the name of the medication or equipment?  fluticasone (FLOVENT HFA) 110 MCG/ACT inhaler  montelukast (SINGULAIR) 10 MG tablet    Have you contacted your pharmacy to request a refill? Yes   Which pharmacy would you like this sent to?  [facility] - [place], [place] - [address] [place] [address] Phone: [phone] Fax: [phone]    Patient notified that their request is being sent to the clinical staff for review and that they should receive a response within 2 business days.   Please advise at Mobile [phone] (mobile)

## 2023-03-26 NOTE — Telephone Encounter (Signed)
Inform pt meds was sent to [facility] on  02/25/2023. She will contact [facility]../lmb

## 2023-03-27 DIAGNOSIS — M5416 Radiculopathy, lumbar region: Secondary | ICD-10-CM | POA: Diagnosis not present

## 2023-04-02 ENCOUNTER — Ambulatory Visit (INDEPENDENT_AMBULATORY_CARE_PROVIDER_SITE_OTHER): Payer: 59 | Admitting: Psychology

## 2023-04-02 DIAGNOSIS — F329 Major depressive disorder, single episode, unspecified: Secondary | ICD-10-CM

## 2023-04-02 DIAGNOSIS — F418 Other specified anxiety disorders: Secondary | ICD-10-CM
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
## 2023-04-03 ENCOUNTER — Other Ambulatory Visit: Payer: Self-pay | Admitting: Gastroenterology

## 2023-04-03 DIAGNOSIS — K649 Unspecified hemorrhoids: Secondary | ICD-10-CM

## 2023-04-08 DIAGNOSIS — Z1231 Encounter for screening mammogram for malignant neoplasm of breast: Secondary | ICD-10-CM | POA: Diagnosis not present

## 2023-04-08 DIAGNOSIS — C50911 Malignant neoplasm of unspecified site of right female breast: Secondary | ICD-10-CM | POA: Diagnosis not present

## 2023-04-08 DIAGNOSIS — N6091 Unspecified benign mammary dysplasia of right breast: Secondary | ICD-10-CM | POA: Diagnosis not present

## 2023-04-09 ENCOUNTER — Encounter: Payer: 59 | Admitting: Gastroenterology

## 2023-04-09 DIAGNOSIS — Z79891 Long term (current) use of opiate analgesic: Secondary | ICD-10-CM | POA: Diagnosis not present

## 2023-04-09 DIAGNOSIS — M5416 Radiculopathy, lumbar region: Secondary | ICD-10-CM | POA: Diagnosis not present

## 2023-04-09 DIAGNOSIS — G8929 Other chronic pain: Secondary | ICD-10-CM | POA: Diagnosis not present

## 2023-04-17 ENCOUNTER — Encounter: Payer: 59 | Admitting: Gastroenterology

## 2023-04-19 DIAGNOSIS — M4326 Fusion of spine, lumbar region: Secondary | ICD-10-CM | POA: Diagnosis not present

## 2023-04-19 DIAGNOSIS — G894 Chronic pain syndrome: Secondary | ICD-10-CM | POA: Diagnosis not present

## 2023-04-19 DIAGNOSIS — M4322 Fusion of spine, cervical region: Secondary | ICD-10-CM | POA: Diagnosis not present

## 2023-04-20 DIAGNOSIS — J4520 Mild intermittent asthma, uncomplicated: Secondary | ICD-10-CM | POA: Diagnosis not present

## 2023-04-24 ENCOUNTER — Ambulatory Visit: Payer: 59 | Admitting: Psychology

## 2023-05-07 DIAGNOSIS — G8929 Other chronic pain: Secondary | ICD-10-CM | POA: Diagnosis not present

## 2023-05-07 DIAGNOSIS — M5416 Radiculopathy, lumbar region: Secondary | ICD-10-CM | POA: Diagnosis not present

## 2023-05-07 DIAGNOSIS — Z79891 Long term (current) use of opiate analgesic: Secondary | ICD-10-CM | POA: Diagnosis not present

## 2023-05-09 ENCOUNTER — Encounter: Payer: Self-pay | Admitting: Gastroenterology

## 2023-05-09 ENCOUNTER — Ambulatory Visit (INDEPENDENT_AMBULATORY_CARE_PROVIDER_SITE_OTHER): Payer: 59 | Admitting: Gastroenterology

## 2023-05-09 VITALS — BP 126/80 | HR 95 | Ht 59.0 in

## 2023-05-09 DIAGNOSIS — K642 Third degree hemorrhoids: Secondary | ICD-10-CM

## 2023-05-09 MED ORDER — PANTOPRAZOLE SODIUM 40 MG PO TBEC: 40.0000 mg | DELAYED_RELEASE_TABLET | Freq: Every day | ORAL | 3 refills | Status: DC

## 2023-05-09 NOTE — Progress Notes (Signed)
PROCEDURE NOTE: The patient presents with symptomatic grade III hemorrhoids, requesting rubber band ligation of their hemorrhoidal disease.  All risks, benefits and alternative forms of therapy were described and informed consent was obtained. RP and RA previously banded and no prolapse noted since last banding  The anorectum was pre-medicated during digital exam with 0.125% NTG and 5% lidocaine.  The decision was made to band the LL internal hemorrhoid, and the [facility] O'Regan System was used to perform band ligation without complication.  Digital anorectal examination was then performed to assure proper positioning of the band and to adjust the banded tissue as required. No adjustment was needed.  No complications were encountered and the patient tolerated the procedure well.  Dietary and behavioral recommendations were given and along with follow-up instructions.    High fiber diet, Benefiber daily and at least 8 glasses of water daily. The patient was discharged home without pain or other post procedure problems.

## 2023-05-09 NOTE — Patient Instructions (Signed)
HEMORRHOID BANDING PROCEDURE    FOLLOW-UP CARE   The procedure you have had should have been relatively painless since the banding of the area involved does not have nerve endings and there is no pain sensation.  The rubber band cuts off the blood supply to the hemorrhoid and the band may fall off as soon as 48 hours after the banding (the band may occasionally be seen in the toilet bowl following a bowel movement). You may notice a temporary feeling of fullness in the rectum which should respond adequately to plain Tylenol or Motrin.  Following the banding, avoid strenuous exercise that evening and resume full activity the next day.  A sitz bath (soaking in a warm tub) or bidet is soothing, and can be useful for cleansing the area after bowel movements.     To avoid constipation, take two tablespoons of natural wheat bran, natural oat bran, flax, Benefiber or any over the counter fiber supplement and increase your water intake to 7-8 glasses daily.    Unless you have been prescribed anorectal medication, do not put anything inside your rectum for two weeks: No suppositories, enemas, fingers, etc.  Occasionally, you may have more bleeding than usual after the banding procedure.  This is often from the untreated hemorrhoids rather than the treated one.  Don't be concerned if there is a tablespoon or so of blood.  If there is more blood than this, lie flat with your bottom higher than your head and apply an ice pack to the area. If the bleeding does not stop within a half an hour or if you feel faint, call our office at [phone] or go to the emergency room.  Problems are not common; however, if there is a substantial amount of bleeding, severe pain, chills, fever or difficulty passing urine (very rare) or other problems, you should call us at [phone] or report to the nearest emergency room.  Do not stay seated continuously for more than 2-3 hours for a day or two after the procedure.   Tighten your buttock muscles 10-15 times every two hours and take 10-15 deep breaths every 1-2 hours.  Do not spend more than a few minutes on the toilet if you cannot empty your bowel; instead re-visit the toilet at a later time.    Thank you for choosing me and [facility].  Malcolm T. Stark, Jr., MD., FACG  

## 2023-05-16 ENCOUNTER — Ambulatory Visit: Payer: 59 | Admitting: Psychology

## 2023-05-16 DIAGNOSIS — M4322 Fusion of spine, cervical region: Secondary | ICD-10-CM | POA: Diagnosis not present

## 2023-05-16 DIAGNOSIS — M4326 Fusion of spine, lumbar region: Secondary | ICD-10-CM | POA: Diagnosis not present

## 2023-05-16 DIAGNOSIS — G894 Chronic pain syndrome: Secondary | ICD-10-CM | POA: Diagnosis not present

## 2023-05-16 DIAGNOSIS — M7918 Myalgia, other site: Secondary | ICD-10-CM | POA: Diagnosis not present

## 2023-05-20 DIAGNOSIS — J4520 Mild intermittent asthma, uncomplicated: Secondary | ICD-10-CM | POA: Diagnosis not present

## 2023-05-23 ENCOUNTER — Other Ambulatory Visit: Payer: Self-pay | Admitting: Internal Medicine

## 2023-05-23 DIAGNOSIS — J4520 Mild intermittent asthma, uncomplicated: Secondary | ICD-10-CM

## 2023-05-28 ENCOUNTER — Other Ambulatory Visit: Payer: Self-pay | Admitting: Internal Medicine

## 2023-05-28 DIAGNOSIS — J301 Allergic rhinitis due to pollen: Secondary | ICD-10-CM

## 2023-05-30 ENCOUNTER — Ambulatory Visit (INDEPENDENT_AMBULATORY_CARE_PROVIDER_SITE_OTHER): Payer: 59

## 2023-05-30 VITALS — Wt 186.0 lb

## 2023-05-30 DIAGNOSIS — Z0000 Encounter for general adult medical examination without abnormal findings: Secondary | ICD-10-CM | POA: Diagnosis not present

## 2023-05-30 NOTE — Patient Instructions (Addendum)
Emily [name] , Thank you for taking time to come for your Medicare Wellness Visit. I appreciate your ongoing commitment to your health goals. Please review the following plan we discussed and let me know if I can assist you in the future.   These are the goals we discussed:  Goals      Client understands the importance of follow-up with providers by attending scheduled visits.        This is a list of the screening recommended for you and due dates:  Health Maintenance  Topic Date Due   Zoster (Shingles) Vaccine (1 of 2) Never done   COVID-19 Vaccine (4 - 2023-24 season) 07/20/2022   Flu Shot  06/20/2023   Medicare Annual Wellness Visit  05/29/2024   Mammogram  10/17/2024   DTaP/Tdap/Td vaccine (3 - Td or Tdap) 07/18/2030   Colon Cancer Screening  12/19/2032   Hepatitis C Screening  Completed   HIV Screening  Completed   HPV Vaccine  Aged Out    Advanced directives: Advance directive discussed with you today. I have provided a copy for you to complete at home and have notarized. Once this is complete please bring a copy in to our office so we can scan it into your chart.  Conditions/risks identified: Yes  Next appointment: It was nice speaking with you today!  Please follow up in one year for your annual wellness visit via telephone call with Nurse Percell Miller on 06/01/2024 at 11:30 a.m.  If you need to cancel or reschedule please call [phone].  Preventive Care 40-64 Years, Female Preventive care refers to lifestyle choices and visits with your health care provider that can promote health and wellness. What does preventive care include? A yearly physical exam. This is also called an annual well check. Dental exams once or twice a year. Routine eye exams. Ask your health care provider how often you should have your eyes checked. Personal lifestyle choices, including: Daily care of your teeth and gums. Regular physical activity. Eating a healthy diet. Avoiding tobacco and drug  use. Limiting alcohol use. Practicing safe sex. Taking low-dose aspirin daily starting at age 25. Taking vitamin and mineral supplements as recommended by your health care provider. What happens during an annual well check? The services and screenings done by your health care provider during your annual well check will depend on your age, overall health, lifestyle risk factors, and family history of disease. Counseling  Your health care provider may ask you questions about your: Alcohol use. Tobacco use. Drug use. Emotional well-being. Home and relationship well-being. Sexual activity. Eating habits. Work and work [employer]. Method of birth control. Menstrual cycle. Pregnancy history. Screening  You may have the following tests or measurements: Height, weight, and BMI. Blood pressure. Lipid and cholesterol levels. These may be checked every 5 years, or more frequently if you are over 17 years old. Skin check. Lung cancer screening. You may have this screening every year starting at age 77 if you have a 30-pack-year history of smoking and currently smoke or have quit within the past 15 years. Fecal occult blood test (FOBT) of the stool. You may have this test every year starting at age 54. Flexible sigmoidoscopy or colonoscopy. You may have a sigmoidoscopy every 5 years or a colonoscopy every 10 years starting at age 12. Hepatitis C blood test. Hepatitis B blood test. Sexually transmitted disease (STD) testing. Diabetes screening. This is done by checking your blood sugar (glucose) after you have not eaten  for a while (fasting). You may have this done every 1-3 years. Mammogram. This may be done every 1-2 years. Talk to your health care provider about when you should start having regular mammograms. This may depend on whether you have a family history of breast cancer. BRCA-related cancer screening. This may be done if you have a family history of breast, ovarian, tubal, or  peritoneal cancers. Pelvic exam and Pap test. This may be done every 3 years starting at age 56. Starting at age 57, this may be done every 5 years if you have a Pap test in combination with an HPV test. Bone density scan. This is done to screen for osteoporosis. You may have this scan if you are at high risk for osteoporosis. Discuss your test results, treatment options, and if necessary, the need for more tests with your health care provider. Vaccines  Your health care provider may recommend certain vaccines, such as: Influenza vaccine. This is recommended every year. Tetanus, diphtheria, and acellular pertussis (Tdap, Td) vaccine. You may need a Td booster every 10 years. Zoster vaccine. You may need this after age 27. Pneumococcal 13-valent conjugate (PCV13) vaccine. You may need this if you have certain conditions and were not previously vaccinated. Pneumococcal polysaccharide (PPSV23) vaccine. You may need one or two doses if you smoke cigarettes or if you have certain conditions. Talk to your health care provider about which screenings and vaccines you need and how often you need them. This information is not intended to replace advice given to you by your health care provider. Make sure you discuss any questions you have with your health care provider. Document Released: 12/02/2015 Document Revised: 07/25/2016 Document Reviewed: 09/06/2015 Elsevier Interactive Patient Education  2017 [employer].    Fall Prevention in the Home Falls can cause injuries. They can happen to people of all ages. There are many things you can do to make your home safe and to help prevent falls. What can I do on the outside of my home? Regularly fix the edges of walkways and driveways and fix any cracks. Remove anything that might make you trip as you walk through a door, such as a raised step or threshold. Trim any bushes or trees on the path to your home. Use bright outdoor lighting. Clear any walking  paths of anything that might make someone trip, such as rocks or tools. Regularly check to see if handrails are loose or broken. Make sure that both sides of any steps have handrails. Any raised decks and porches should have guardrails on the edges. Have any leaves, snow, or ice cleared regularly. Use sand or salt on walking paths during winter. Clean up any spills in your garage right away. This includes oil or grease spills. What can I do in the bathroom? Use night lights. Install grab bars by the toilet and in the tub and shower. Do not use towel bars as grab bars. Use non-skid mats or decals in the tub or shower. If you need to sit down in the shower, use a plastic, non-slip stool. Keep the floor dry. Clean up any water that spills on the floor as soon as it happens. Remove soap buildup in the tub or shower regularly. Attach bath mats securely with double-sided non-slip rug tape. Do not have throw rugs and other things on the floor that can make you trip. What can I do in the bedroom? Use night lights. Make sure that you have a light by your bed that  is easy to reach. Do not use any sheets or blankets that are too big for your bed. They should not hang down onto the floor. Have a firm chair that has side arms. You can use this for support while you get dressed. Do not have throw rugs and other things on the floor that can make you trip. What can I do in the kitchen? Clean up any spills right away. Avoid walking on wet floors. Keep items that you use a lot in easy-to-reach places. If you need to reach something above you, use a strong step stool that has a grab bar. Keep electrical cords out of the way. Do not use floor polish or wax that makes floors slippery. If you must use wax, use non-skid floor wax. Do not have throw rugs and other things on the floor that can make you trip. What can I do with my stairs? Do not leave any items on the stairs. Make sure that there are handrails  on both sides of the stairs and use them. Fix handrails that are broken or loose. Make sure that handrails are as long as the stairways. Check any carpeting to make sure that it is firmly attached to the stairs. Fix any carpet that is loose or worn. Avoid having throw rugs at the top or bottom of the stairs. If you do have throw rugs, attach them to the floor with carpet tape. Make sure that you have a light switch at the top of the stairs and the bottom of the stairs. If you do not have them, ask someone to add them for you. What else can I do to help prevent falls? Wear shoes that: Do not have high heels. Have rubber bottoms. Are comfortable and fit you well. Are closed at the toe. Do not wear sandals. If you use a stepladder: Make sure that it is fully opened. Do not climb a closed stepladder. Make sure that both sides of the stepladder are locked into place. Ask someone to hold it for you, if possible. Clearly mark and make sure that you can see: Any grab bars or handrails. First and last steps. Where the edge of each step is. Use tools that help you move around (mobility aids) if they are needed. These include: Canes. Walkers. Scooters. Crutches. Turn on the lights when you go into a dark area. Replace any light bulbs as soon as they burn out. Set up your furniture so you have a clear path. Avoid moving your furniture around. If any of your floors are uneven, fix them. If there are any pets around you, be aware of where they are. Review your medicines with your doctor. Some medicines can make you feel dizzy. This can increase your chance of falling. Ask your doctor what other things that you can do to help prevent falls. This information is not intended to replace advice given to you by your health care provider. Make sure you discuss any questions you have with your health care provider. Document Released: 09/01/2009 Document Revised: 04/12/2016 Document Reviewed:  12/10/2014 Elsevier Interactive Patient Education  2017 [employer].

## 2023-05-30 NOTE — Progress Notes (Signed)
Subjective:   Emily [name] is a 52 y.o. female who presents for Medicare Annual (Subsequent) preventive examination.  Visit Complete: Virtual  I connected with  [name] on 05/30/23 by a audio enabled telemedicine application and verified that I am speaking with the correct person using two identifiers.  Patient Location: Home  Provider Location: Office/Clinic  I discussed the limitations of evaluation and management by telemedicine. The patient expressed understanding and agreed to proceed.  Review of Systems     Cardiac Risk Factors include: advanced age (>37men, >3 women);family history of premature cardiovascular disease;hypertension;obesity (BMI >30kg/m2);sedentary lifestyle;smoking/ tobacco exposure     Objective:    Today's Vitals   05/30/23 1053  Weight: 186 lb (84.4 kg)  PainSc: 6   PainLoc: Neck   Body mass index is 37.57 kg/m.     05/30/2023   10:57 AM 01/16/2021    1:45 PM 07/14/2020   10:36 AM 09/23/2019    8:37 AM 09/16/2019    1:49 PM 08/05/2019   11:17 AM 07/29/2018   12:36 PM  Advanced Directives  Does Patient Have a Medical Advance Directive? No No No No No No No  Does patient want to make changes to medical advance directive?      No - Patient declined   Would patient like information on creating a medical advance directive? Yes (MAU/Ambulatory/Procedural Areas - Information given) Yes (MAU/Ambulatory/Procedural Areas - Information given) Yes (MAU/Ambulatory/Procedural Areas - Information given) No - Patient declined Yes (MAU/Ambulatory/Procedural Areas - Information given)  Yes (ED - Information included in AVS)    Current Medications (verified) Outpatient Encounter Medications as of 05/30/2023  Medication Sig   albuterol (PROVENTIL) (2.5 MG/3ML) 0.083% nebulizer solution Take 3 mLs (2.5 mg total) by nebulization every 4 (four) hours as needed for wheezing or shortness of breath.   albuterol (VENTOLIN HFA) 108 (90 Base)  MCG/ACT inhaler INHALE 1-2 PUFFS BY MOUTH into THE lungs EVERY 6 HOURS AS NEEDED FOR WHEEZING AND/OR SHORTNESS OF BREATH   busPIRone (BUSPAR) 7.5 MG tablet Take 1 tablet (7.5 mg total) by mouth 2 (two) times daily.   Calcium Carbonate-Simethicone 750-80 MG CHEW Chew 1 tablet by mouth 3 times/day as needed-between meals & bedtime.   cyclobenzaprine (FLEXERIL) 10 MG tablet Take 10 mg by mouth daily as needed for muscle spasms.   diclofenac Sodium (VOLTAREN) 1 % GEL SMARTSIG:2 Gram(s) Topical 4 Times Daily PRN   dicyclomine (BENTYL) 20 MG tablet TAKE 1 TABLET (20 MG TOTAL) BY MOUTH IN THE MORNING, AT NOON, IN THE EVENING, AND AT BEDTIME.   escitalopram (LEXAPRO) 20 MG tablet Take 1 tablet (20 mg total) by mouth daily.   famotidine (PEPCID) 20 MG tablet TAKE ONE TABLET BY MOUTH EVERYDAY AT BEDTIME   fluticasone (FLONASE) 50 MCG/ACT nasal spray INSTILL 2 SPRAYS IN EACH NOSTRIL ONCE DAILY   fluticasone (FLOVENT HFA) 110 MCG/ACT inhaler INHALE TWO PUFFS BY MOUTH INTO LUNGS BY MOUTH TWICE DAILY   loratadine (CLARITIN) 10 MG tablet Take 1 tablet (10 mg total) by mouth at bedtime.   methocarbamol (ROBAXIN) 750 MG tablet Take 750 mg by mouth every 8 (eight) hours as needed for muscle spasms.   montelukast (SINGULAIR) 10 MG tablet TAKE ONE TABLET BY MOUTH EVERYDAY AT BEDTIME Annual appt due in July must see provider for future refills   Multiple Vitamins-Minerals (EMERGEN-C IMMUNE PLUS/VIT D PO) Take 3 capsules by mouth daily.   nicotine (NICOTINE STEP 1) 21 mg/24hr patch Place 1 patch (21 mg  total) onto the skin daily. Remove patch 30 min before bedtime   nicotine polacrilex (COMMIT) 2 MG lozenge Take 1 lozenge (2 mg total) by mouth as needed (every 1-2 hours for cravings).   Olopatadine HCl 0.2 % SOLN Apply 1-2 drops to eye 2 (two) times daily as needed (itching, running eyes).   ondansetron (ZOFRAN-ODT) 4 MG disintegrating tablet Take 4 mg by mouth every 8 (eight) hours as needed for vomiting or nausea.    oxyCODONE-acetaminophen (PERCOCET) 10-325 MG tablet Take 1 tablet by mouth every 6 (six) hours as needed for pain.   pantoprazole (PROTONIX) 40 MG tablet Take 1 tablet (40 mg total) by mouth daily.   polyethylene glycol powder (GLYCOLAX/MIRALAX) 17 GM/SCOOP powder Take 17 g by mouth 2 (two) times daily as needed.   pregabalin (LYRICA) 150 MG capsule Take 150 mg by mouth 2 (two) times daily.   PROCTO-MED HC 2.5 % rectal cream APPLY RECTALLY TWICE DAILY   STIMULANT LAXATIVE 8.6-50 MG tablet TAKE ONE TABLET BY MOUTH ONCE DAILY   tamoxifen (NOLVADEX) 20 MG tablet Take 1 tablet (20 mg total) by mouth daily.   tiZANidine (ZANAFLEX) 4 MG tablet Take 4 mg by mouth as needed.   No facility-administered encounter medications on file as of 05/30/2023.    Allergies (verified) Other, Wound dressing adhesive, Morphine, Morphine and codeine, and Tape   History: Past Medical History:  Diagnosis Date   Allergic rhinitis    Allergy    Anal fissure    Anemia    Anxiety    Arthritis    Asthma    uses inhaler as needed   Breast cancer (HCC)    Chronic neck pain    pain mgmt in [place]   Depression    Gallbladder disease    GERD (gastroesophageal reflux disease)    Headache(784.0)    HTN (hypertension)    BP has been normal recently per pt and not taking meds   Hyperthyroidism    Obesity    PONV (postoperative nausea and vomiting)    Thyroid nodule    incidental on MRI neck 2014, s/p endo eval   Uterine fibroid    Past Surgical History:  Procedure Laterality Date   BREAST LUMPECTOMY Right 2021   BREAST LUMPECTOMY WITH RADIOACTIVE SEED LOCALIZATION Right 09/23/2019   Procedure: RIGHT BREAST LUMPECTOMY WITH RADIOACTIVE SEED LOCALIZATION;  Surgeon: Harriette Bouillon, MD;  Location: [facility];  Service: General;  Laterality: Right;   BREAST REDUCTION SURGERY Right    with partial mastectomy   CERVICAL SPINE SURGERY  2015   2 disc removed   Chirai Malformation  2003   CHOLECYSTECTOMY   1993   DILITATION & CURRETTAGE/HYSTROSCOPY WITH NOVASURE ABLATION N/A 03/15/2014   Procedure: DILATATION & CURETTAGE/HYSTEROSCOPY WITH NOVASURE ABLATION;  Surgeon: Lenoard Aden, MD;  Location: [facility];  Service: Gynecology;  Laterality: N/A;   Laberal tear  2001   Right Shoulder   LAPAROSCOPIC TOTAL HYSTERECTOMY  02/03/2018   Partial kept ovaries   LUMBAR DISC SURGERY     L4-L5   MOUTH SURGERY     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)   WISDOM TOOTH EXTRACTION  1992   Family History  Problem Relation Age of Onset   Breast cancer Mother        over 79 when dx   Diverticulitis Mother    Arthritis Mother    Hyperlipidemia Mother    Hypertension Mother    Pancreatic cancer Paternal Grandmother  Breast cancer Paternal Aunt        x 2 pat aunts   Irritable bowel syndrome Daughter    Hypotension Daughter    Alcohol abuse Son    Alcohol abuse Paternal Uncle    Alcohol abuse Maternal Uncle        x several   Colon cancer Other        maternal grandmothers sister   Social History   Socioeconomic History   Marital status: Divorced    Spouse name: Not on file   Number of children: 2   Years of education: Not on file   Highest education level: Not on file  Occupational History   Occupation: disabled  Tobacco Use   Smoking status: Every Day    Current packs/day: 1.00    Average packs/day: 1 pack/day for 18.0 years (18.0 ttl pk-yrs)    Types: Cigarettes   Smokeless tobacco: Never   Tobacco comments:    starting NRT patches/lozenges 03/2021  Vaping Use   Vaping status: Never Used  Substance and Sexual Activity   Alcohol use: No    Alcohol/week: 0.0 standard drinks of alcohol   Drug use: No   Sexual activity: Yes  Other Topics Concern   Not on file  Social History Narrative   Lives w/fiance and daughter.   On disability.   Social Determinants of Health   Financial Resource Strain: Low Risk  (12/18/2022)   [facility]   Overall Financial  Resource Strain (CARDIA)    Difficulty of Paying Living Expenses: Not hard at all  Food Insecurity: No Food Insecurity (12/18/2022)   Received from [facility]   Hunger Vital Sign    Worried About Running Out of Food in the Last Year: Never true    Ran Out of Food in the Last Year: Never true  Transportation Needs: No Transportation Needs (12/18/2022)   Received from [facility] - Transportation    Lack of Transportation (Medical): No    Lack of Transportation (Non-Medical): No  Physical Activity: Insufficiently Active (05/23/2022)   Exercise Vital Sign    Days of Exercise per Week: 7 days    Minutes of Exercise per Session: 10 min  Stress: No Stress Concern Present (01/04/2023)   Received from [facility] - Occupational Stress Questionnaire    Feeling of Stress : Not at all  Social Connections: Unknown (05/17/2023)   [facility]   Social Network    Social Network: Not on file    Tobacco Counseling Ready to quit: Not Answered Counseling given: Not Answered Tobacco comments: starting NRT patches/lozenges 03/2021   Clinical Intake:  Pre-visit preparation completed: Yes  Pain : 0-10 Pain Score: 6  Pain Type: Chronic pain     Nutritional Risks: None Diabetes: No  How often do you need to have someone help you when you read instructions, pamphlets, or other written materials from your doctor or pharmacy?: 1 - Never What is the last grade level you completed in school?: HSG  Interpreter Needed?: No  Information entered by :: Susie Cassette, LPN.   Activities of Daily Living    05/30/2023   11:06 AM  In your present state of health, do you have any difficulty performing the following activities:  Hearing? 0  Vision? 0  Difficulty concentrating or making decisions? 0  Walking or climbing stairs? 0  Dressing or bathing? 0  Doing errands, shopping? 0  Preparing Food and eating ? N  Using the  Toilet? N  In the past six months, have you accidently leaked urine? N  Do you have problems with loss of bowel control? N  Managing your Medications? N  Managing your Finances? N  Housekeeping or managing your Housekeeping? N    Patient Care Team: Myrlene Broker, MD as PCP - General (Internal Medicine) Letta Kocher, MD (Rehabilitation) Romero Belling, MD (Inactive) (Endocrinology) Olivia Mackie, MD (Obstetrics and Gynecology) Rhona Raider, DO (Anesthesiology) Harriette Bouillon, MD as Consulting Physician (General Surgery) Kathyrn Sheriff, [facility] (Inactive) as Pharmacist (Pharmacist) Conley Rolls, [place], [place] as Referring Physician (Optometry)  Indicate any recent Medical Services you may have received from other than [facility] providers in the past year (date may be approximate).     Assessment:   This is a routine wellness examination for Shantay.  Hearing/Vision screen Hearing Screening - Comments:: Denies hearing difficulties. No hearing aids.   Vision Screening - Comments:: Wears rx glasses - up to date with routine eye exams with [facility] (Dr. My Mardene Sayer)   Dietary issues and exercise activities discussed:     Goals Addressed             This Visit's Progress    Client understands the importance of follow-up with providers by attending scheduled visits.        Depression Screen    05/30/2023   11:04 AM 12/24/2022   11:10 AM 06/18/2022   10:41 AM 05/23/2022    1:34 PM 01/23/2022    3:10 PM 11/06/2021    2:30 PM 06/21/2021    1:23 PM  PHQ 2/9 Scores  PHQ - 2 Score 1 0 2 2 4 2 3   PHQ- 9 Score 1  9 8 20  9     Fall Risk    05/30/2023   10:59 AM 12/24/2022   11:09 AM 06/18/2022   10:41 AM 05/23/2022    1:49 PM 01/16/2021    1:45 PM  Fall Risk   Falls in the past year? 0 0 1 1 0  Number falls in past yr: 0 0 0 0 0  Injury with Fall? 0 0 1 1 0  Risk for fall due to : No Fall Risks    No Fall Risks  Follow up Falls prevention discussed        MEDICARE RISK  AT HOME:  Medicare Risk at Home - 05/30/23 1100     Any stairs in or around the home? No    If so, are there any without handrails? No    Home free of loose throw rugs in walkways, pet beds, electrical cords, etc? Yes    Adequate lighting in your home to reduce risk of falls? Yes    Life alert? Yes   Alexa   Use of a cane, walker or w/c? Yes    Grab bars in the bathroom? No    Shower chair or bench in shower? Yes    Elevated toilet seat or a handicapped toilet? Yes             TIMED UP AND GO:  Was the test performed?  No    Cognitive Function:        05/30/2023   11:03 AM 05/23/2022    1:49 PM  6CIT Screen  What Year? 0 points 0 points  What month? 0 points 0 points  What time? 0 points 0 points  Count back from 20  0 points 0 points  Months in reverse 0 points 0 points  Repeat phrase 0 points 0 points  Total Score 0 points 0 points    Immunizations Immunization History  Administered Date(s) Administered   Influenza,inj,Quad PF,6+ Mos 11/04/2013, 12/16/2015, 08/22/2016, 01/01/2018, 07/29/2018, 08/10/2019, 07/18/2020, 09/26/2022   Moderna Sars-Covid-2 Vaccination 02/04/2020, 03/04/2020, 02/03/2021   PNEUMOCOCCAL CONJUGATE-20 07/17/2021, 09/26/2022   Td 03/22/2010   Tdap 07/18/2020    TDAP status: Up to date  Flu Vaccine status: Up to date  Pneumococcal vaccine status: Up to date  Covid-19 vaccine status: Completed vaccines  Qualifies for Shingles Vaccine? Yes   Zostavax completed No   Shingrix Completed?: No.    Education has been provided regarding the importance of this vaccine. Patient has been advised to call insurance company to determine out of pocket expense if they have not yet received this vaccine. Advised may also receive vaccine at local pharmacy or Health Dept. Verbalized acceptance and understanding.  Screening Tests Health Maintenance  Topic Date Due   Zoster Vaccines- Shingrix (1 of 2) Never done   COVID-19 Vaccine (4 - 2023-24 season)  07/20/2022   INFLUENZA VACCINE  06/20/2023   Medicare Annual Wellness (AWV)  05/29/2024   MAMMOGRAM  10/17/2024   DTaP/Tdap/Td (3 - Td or Tdap) 07/18/2030   Colonoscopy  12/19/2032   Hepatitis C Screening  Completed   HIV Screening  Completed   HPV VACCINES  Aged Out    Health Maintenance  Health Maintenance Due  Topic Date Due   Zoster Vaccines- Shingrix (1 of 2) Never done   COVID-19 Vaccine (4 - 2023-24 season) 07/20/2022    Colorectal cancer screening: Type of screening: Colonoscopy. Completed 12/19/2022. Repeat every 10 years  Mammogram status: Completed 10/17/2022. Repeat every year  Whole Body Scan: Com[facility] 11/02/2022  Lung Cancer Screening: (Low Dose CT Chest recommended if Age 11-80 years, 20 pack-year currently smoking OR have quit w/in 15years.) does not qualify.   Lung Cancer Screening Referral: no  Additional Screening:  Hepatitis C Screening: does qualify; Completed 07/18/2020  Vision Screening: Recommended annual ophthalmology exams for early detection of glaucoma and other disorders of the eye. Is the patient up to date with their annual eye exam?  Yes  Who is the provider or what is the name of the office in which the patient attends annual eye exams? [facility] If pt is not established with a provider, would they like to be referred to a provider to establish care? No .   Dental Screening: Recommended annual dental exams for proper oral hygiene  Diabetic Foot Exam: N/A  Community Resource Referral / Chronic Care Management: CRR required this visit?  No   CCM required this visit?  No     Plan:     I have personally reviewed and noted the following in the patient's chart:   Medical and social history Use of alcohol, tobacco or illicit drugs  Current medications and supplements including opioid prescriptions. Patient is currently taking opioid prescriptions. Information provided to patient regarding non-opioid alternatives.  Patient advised to discuss non-opioid treatment plan with their provider. Functional ability and status Nutritional status Physical activity Advanced directives List of other physicians Hospitalizations, surgeries, and ER visits in previous 12 months Vitals Screenings to include cognitive, depression, and falls Referrals and appointments  In addition, I have reviewed and discussed with patient certain preventive protocols, quality metrics, and best practice recommendations. A written personalized care plan for preventive services as well as  general preventive health recommendations were provided to patient.     Mickeal Needy, LPN   1/61/0960   After Visit Summary: (Mail) Due to this being a telephonic visit, the after visit summary with patients personalized plan was offered to patient via mail   Nurse Notes: Normal cognitive status assessed by direct observation via telephone conversation by this Nurse Health Advisor. No abnormalities found.

## 2023-06-04 DIAGNOSIS — M5416 Radiculopathy, lumbar region: Secondary | ICD-10-CM | POA: Diagnosis not present

## 2023-06-04 DIAGNOSIS — Z79891 Long term (current) use of opiate analgesic: Secondary | ICD-10-CM | POA: Diagnosis not present

## 2023-06-04 DIAGNOSIS — G8929 Other chronic pain: Secondary | ICD-10-CM | POA: Diagnosis not present

## 2023-06-05 ENCOUNTER — Other Ambulatory Visit: Payer: Self-pay | Admitting: Internal Medicine

## 2023-06-05 DIAGNOSIS — J4520 Mild intermittent asthma, uncomplicated: Secondary | ICD-10-CM

## 2023-06-05 DIAGNOSIS — J301 Allergic rhinitis due to pollen: Secondary | ICD-10-CM

## 2023-06-19 ENCOUNTER — Encounter (INDEPENDENT_AMBULATORY_CARE_PROVIDER_SITE_OTHER): Payer: Self-pay

## 2023-06-20 DIAGNOSIS — J4520 Mild intermittent asthma, uncomplicated: Secondary | ICD-10-CM | POA: Diagnosis not present

## 2023-06-21 ENCOUNTER — Ambulatory Visit: Payer: 59 | Admitting: Internal Medicine

## 2023-07-04 ENCOUNTER — Ambulatory Visit: Payer: 59 | Admitting: Internal Medicine

## 2023-07-09 DIAGNOSIS — G8929 Other chronic pain: Secondary | ICD-10-CM | POA: Diagnosis not present

## 2023-07-09 DIAGNOSIS — M5416 Radiculopathy, lumbar region: Secondary | ICD-10-CM | POA: Diagnosis not present

## 2023-07-09 DIAGNOSIS — Z79891 Long term (current) use of opiate analgesic: Secondary | ICD-10-CM | POA: Diagnosis not present

## 2023-07-12 ENCOUNTER — Ambulatory Visit: Payer: 59 | Admitting: Internal Medicine

## 2023-07-16 ENCOUNTER — Other Ambulatory Visit: Payer: Self-pay | Admitting: Internal Medicine

## 2023-07-16 ENCOUNTER — Other Ambulatory Visit: Payer: Self-pay | Admitting: Gastroenterology

## 2023-07-16 DIAGNOSIS — K649 Unspecified hemorrhoids: Secondary | ICD-10-CM

## 2023-07-16 DIAGNOSIS — J301 Allergic rhinitis due to pollen: Secondary | ICD-10-CM

## 2023-07-19 ENCOUNTER — Other Ambulatory Visit (HOSPITAL_COMMUNITY): Payer: Self-pay

## 2023-07-19 ENCOUNTER — Telehealth: Payer: Self-pay

## 2023-07-19 NOTE — Telephone Encounter (Signed)
Pharmacy Patient Advocate Encounter   Received notification from CoverMyMeds that prior authorization for Fluticasone Propionate HFA 110MCG/ACT aerosol is required/requested.   Insurance verification completed.   The patient is insured through [facility] .   Per test claim: PA required; PA submitted to [facility] via CoverMyMeds Key/confirmation #/EOC [id number] Status is pending

## 2023-07-21 ENCOUNTER — Other Ambulatory Visit: Payer: Self-pay | Admitting: Gastroenterology

## 2023-07-21 DIAGNOSIS — J4520 Mild intermittent asthma, uncomplicated: Secondary | ICD-10-CM | POA: Diagnosis not present

## 2023-07-23 NOTE — Telephone Encounter (Signed)
Pharmacy Patient Advocate Encounter  Received notification from [facility] that Prior Authorization for Fluticasone Propionate HFA 110MCG/ACT aerosol has been DENIED.  Full denial letter will be uploaded to the media tab. See denial reason below.   PA #/Case ID/Reference #: [id number]   Denial Reason: Patient has not tried two of the covered drugs. Covered drugs: Arnuity Ellipta and Qvar RediHaler.

## 2023-07-24 DIAGNOSIS — M5416 Radiculopathy, lumbar region: Secondary | ICD-10-CM | POA: Diagnosis not present

## 2023-07-25 ENCOUNTER — Encounter: Payer: Self-pay | Admitting: Internal Medicine

## 2023-07-25 ENCOUNTER — Ambulatory Visit (INDEPENDENT_AMBULATORY_CARE_PROVIDER_SITE_OTHER): Payer: 59 | Admitting: Internal Medicine

## 2023-07-25 VITALS — BP 124/70 | HR 82 | Temp 98.2°F | Ht 59.0 in | Wt 183.0 lb

## 2023-07-25 DIAGNOSIS — K219 Gastro-esophageal reflux disease without esophagitis: Secondary | ICD-10-CM

## 2023-07-25 DIAGNOSIS — R739 Hyperglycemia, unspecified: Secondary | ICD-10-CM

## 2023-07-25 DIAGNOSIS — I10 Essential (primary) hypertension: Secondary | ICD-10-CM

## 2023-07-25 DIAGNOSIS — Z23 Encounter for immunization: Secondary | ICD-10-CM

## 2023-07-25 DIAGNOSIS — J4520 Mild intermittent asthma, uncomplicated: Secondary | ICD-10-CM | POA: Diagnosis not present

## 2023-07-25 DIAGNOSIS — J301 Allergic rhinitis due to pollen: Secondary | ICD-10-CM | POA: Diagnosis not present

## 2023-07-25 DIAGNOSIS — J410 Simple chronic bronchitis: Secondary | ICD-10-CM

## 2023-07-25 DIAGNOSIS — F331 Major depressive disorder, recurrent, moderate: Secondary | ICD-10-CM | POA: Diagnosis not present

## 2023-07-25 DIAGNOSIS — T402X5A Adverse effect of other opioids, initial encounter: Secondary | ICD-10-CM | POA: Diagnosis not present

## 2023-07-25 DIAGNOSIS — K5903 Drug induced constipation: Secondary | ICD-10-CM

## 2023-07-25 DIAGNOSIS — F418 Other specified anxiety disorders: Secondary | ICD-10-CM

## 2023-07-25 DIAGNOSIS — N6091 Unspecified benign mammary dysplasia of right breast: Secondary | ICD-10-CM

## 2023-07-25 DIAGNOSIS — Z0001 Encounter for general adult medical examination with abnormal findings: Secondary | ICD-10-CM | POA: Diagnosis not present

## 2023-07-25 DIAGNOSIS — N6092 Unspecified benign mammary dysplasia of left breast: Secondary | ICD-10-CM | POA: Diagnosis not present

## 2023-07-25 MED ORDER — FLUTICASONE PROPIONATE 50 MCG/ACT NA SUSP
2.0000 | Freq: Every day | NASAL | 2 refills | Status: DC
Start: 2023-07-25 — End: 2024-07-13

## 2023-07-25 MED ORDER — TRAZODONE HCL 50 MG PO TABS: 50.0000 mg | ORAL_TABLET | Freq: Every day | ORAL | 1 refills | Status: DC

## 2023-07-25 MED ORDER — LORATADINE 10 MG PO TABS
10.0000 mg | ORAL_TABLET | Freq: Every day | ORAL | 3 refills | Status: DC
Start: 2023-07-25 — End: 2024-07-13

## 2023-07-25 MED ORDER — MONTELUKAST SODIUM 10 MG PO TABS
ORAL_TABLET | ORAL | 3 refills | Status: DC
Start: 2023-07-25 — End: 2024-07-13

## 2023-07-25 MED ORDER — FLUTICASONE PROPIONATE HFA 110 MCG/ACT IN AERO
INHALATION_SPRAY | RESPIRATORY_TRACT | 11 refills | Status: DC
Start: 2023-07-25 — End: 2024-07-13

## 2023-07-25 MED ORDER — ALBUTEROL SULFATE HFA 108 (90 BASE) MCG/ACT IN AERS
1.0000 | INHALATION_SPRAY | Freq: Four times a day (QID) | RESPIRATORY_TRACT | 5 refills | Status: DC | PRN
Start: 2023-07-25 — End: 2023-11-11

## 2023-07-25 MED ORDER — FAMOTIDINE 20 MG PO TABS
ORAL_TABLET | ORAL | 3 refills | Status: DC
Start: 2023-07-25 — End: 2023-09-30

## 2023-07-25 MED ORDER — ALBUTEROL SULFATE (2.5 MG/3ML) 0.083% IN NEBU
2.5000 mg | INHALATION_SOLUTION | RESPIRATORY_TRACT | 1 refills | Status: DC | PRN
Start: 2023-07-25 — End: 2023-11-11

## 2023-07-25 MED ORDER — POLYETHYLENE GLYCOL 3350 17 GM/SCOOP PO POWD: 17.0000 g | Freq: Two times a day (BID) | ORAL | 1 refills | Status: AC | PRN

## 2023-07-25 MED ORDER — SENNOSIDES-DOCUSATE SODIUM 8.6-50 MG PO TABS
1.0000 | ORAL_TABLET | Freq: Every day | ORAL | 3 refills | Status: DC
Start: 2023-07-25 — End: 2024-06-18

## 2023-07-25 MED ORDER — BUSPIRONE HCL 7.5 MG PO TABS
7.5000 mg | ORAL_TABLET | Freq: Two times a day (BID) | ORAL | 3 refills | Status: DC
Start: 2023-07-25 — End: 2024-07-13

## 2023-07-25 NOTE — Patient Instructions (Signed)
Think about getting the shingles vaccine at the pharmacy.  We have sent in trazodone to take as needed for sleep.

## 2023-07-25 NOTE — Progress Notes (Signed)
   Subjective:   [name], female    DOB: Mar 06, 1971, 52 y.o.   MRN: [id number]  HPI The patient is here for physical. With other acute concerns as well.   PMH, [facility], social history reviewed and updated  Review of Systems  Constitutional:  Positive for unexpected weight change.  HENT: Negative.    Eyes: Negative.   Respiratory:  Positive for shortness of breath. Negative for cough and chest tightness.   Cardiovascular:  Negative for chest pain, palpitations and leg swelling.  Gastrointestinal:  Negative for abdominal distention, abdominal pain, constipation, diarrhea, nausea and vomiting.  Musculoskeletal: Negative.   Skin:  Positive for wound.  Neurological: Negative.   Psychiatric/Behavioral: Negative.      Objective:  Physical Exam Constitutional:      Appearance: She is well-developed. She is obese.  HENT:     Head: Normocephalic and atraumatic.  Cardiovascular:     Rate and Rhythm: Normal rate and regular rhythm.  Pulmonary:     Effort: Pulmonary effort is normal. No respiratory distress.     Breath sounds: Normal breath sounds. No wheezing or rales.  Abdominal:     General: Bowel sounds are normal. There is no distension.     Palpations: Abdomen is soft.     Tenderness: There is no abdominal tenderness. There is no rebound.  Musculoskeletal:     Cervical back: Normal range of motion.  Skin:    General: Skin is warm and dry.     Comments: Some scabs/scars left ankle without signs of infection  Neurological:     Mental Status: She is alert and oriented to person, place, and time.     Coordination: Coordination normal.     Vitals:   07/25/23 1021  BP: 124/70  Pulse: 82  Temp: 98.2 F (36.8 C)  TempSrc: Oral  SpO2: 98%  Weight: 183 lb (83 kg)  Height: 4\' 11"  (1.499 m)    Assessment & Plan:  Flu shot given at visit

## 2023-07-26 NOTE — Assessment & Plan Note (Addendum)
Without flare today but overall uses albuterol nebulizer and inhaler as well as flovent with colds or allergies. Allergies are persistent and year round.

## 2023-07-26 NOTE — Assessment & Plan Note (Signed)
Using buspar 7.5 mg BID and venlafaxine 37.5 mg daily. Overall stable and will continue.

## 2023-07-26 NOTE — Assessment & Plan Note (Signed)
Counseled to quit smoking and she is unable to make attempt at this time.

## 2023-07-26 NOTE — Assessment & Plan Note (Addendum)
She is interested in losing weight. Counseled about options and given name of zepbound and wegovy to check on coverage. Will prescribe if coverage available. Referral to nutritionist to help with diet and information.

## 2023-07-26 NOTE — Assessment & Plan Note (Signed)
Refilled senokot and PEG. She is having adequate control with this.

## 2023-07-26 NOTE — Assessment & Plan Note (Signed)
BP at goal on no medications needs close monitoring.

## 2023-07-26 NOTE — Assessment & Plan Note (Signed)
Persistent and year round. Using singulair and claritin and flonase

## 2023-07-26 NOTE — Assessment & Plan Note (Signed)
Flu shot given. Shingrix due at pharmacy. Tetanus up to date. Colonoscopy up to date. Mammogram up to date, pap smear up to date. Counseled about sun safety and mole surveillance. Counseled about the dangers of distracted driving. Given 10 year screening recommendations.

## 2023-07-26 NOTE — Assessment & Plan Note (Signed)
Off tamoxifen due to hot flashes but plans to resume soon.

## 2023-07-26 NOTE — Assessment & Plan Note (Signed)
Recent HgA1c stable and will monitor every 6-12 months.

## 2023-08-13 DIAGNOSIS — M5416 Radiculopathy, lumbar region: Secondary | ICD-10-CM | POA: Diagnosis not present

## 2023-08-13 DIAGNOSIS — G8929 Other chronic pain: Secondary | ICD-10-CM | POA: Diagnosis not present

## 2023-08-13 DIAGNOSIS — Z79891 Long term (current) use of opiate analgesic: Secondary | ICD-10-CM | POA: Diagnosis not present

## 2023-08-20 DIAGNOSIS — J4520 Mild intermittent asthma, uncomplicated: Secondary | ICD-10-CM | POA: Diagnosis not present

## 2023-09-10 DIAGNOSIS — M5416 Radiculopathy, lumbar region: Secondary | ICD-10-CM | POA: Diagnosis not present

## 2023-09-10 DIAGNOSIS — Z79891 Long term (current) use of opiate analgesic: Secondary | ICD-10-CM | POA: Diagnosis not present

## 2023-09-10 DIAGNOSIS — G8929 Other chronic pain: Secondary | ICD-10-CM | POA: Diagnosis not present

## 2023-09-19 ENCOUNTER — Other Ambulatory Visit: Payer: Self-pay | Admitting: Gastroenterology

## 2023-09-20 DIAGNOSIS — J4520 Mild intermittent asthma, uncomplicated: Secondary | ICD-10-CM | POA: Diagnosis not present

## 2023-09-30 ENCOUNTER — Encounter: Payer: Self-pay | Admitting: Family Medicine

## 2023-09-30 ENCOUNTER — Ambulatory Visit: Payer: 59 | Admitting: Family Medicine

## 2023-09-30 VITALS — BP 118/78 | HR 91 | Temp 98.4°F | Resp 22 | Ht 59.0 in | Wt 184.0 lb

## 2023-09-30 DIAGNOSIS — J0110 Acute frontal sinusitis, unspecified: Secondary | ICD-10-CM | POA: Diagnosis not present

## 2023-09-30 DIAGNOSIS — J4541 Moderate persistent asthma with (acute) exacerbation: Secondary | ICD-10-CM

## 2023-09-30 MED ORDER — PREDNISONE 20 MG PO TABS
40.0000 mg | ORAL_TABLET | Freq: Every day | ORAL | 0 refills | Status: AC
Start: 2023-09-30 — End: 2023-10-05

## 2023-09-30 MED ORDER — AMOXICILLIN-POT CLAVULANATE 875-125 MG PO TABS
1.0000 | ORAL_TABLET | Freq: Two times a day (BID) | ORAL | 0 refills | Status: AC
Start: 2023-09-30 — End: 2023-10-07

## 2023-09-30 NOTE — Progress Notes (Unsigned)
Assessment & Plan:  1. Acute non-recurrent frontal sinusitis Education provided on sinus infections. - amoxicillin-clavulanate (AUGMENTIN) 875-125 MG tablet; Take 1 tablet by mouth 2 (two) times daily for 7 days.  Dispense: 14 tablet; Refill: 0 - predniSONE (DELTASONE) 20 MG tablet; Take 2 tablets (40 mg total) by mouth daily for 5 days.  Dispense: 10 tablet; Refill: 0  2. Moderate persistent asthma with acute exacerbation Continue using inhalers, nebulizer, and allergy medications. - amoxicillin-clavulanate (AUGMENTIN) 875-125 MG tablet; Take 1 tablet by mouth 2 (two) times daily for 7 days.  Dispense: 14 tablet; Refill: 0 - predniSONE (DELTASONE) 20 MG tablet; Take 2 tablets (40 mg total) by mouth daily for 5 days.  Dispense: 10 tablet; Refill: 0    Follow up plan: Return if symptoms worsen or fail to improve.  Deliah Boston, MSN, APRN, FNP-C  Subjective:  HPI: Emily [name] is a 52 y.o. female presenting on 09/30/2023 for URI (Cough- congestion is clear, nasal drainage - bloody mucus. Pos sinus pressure and facial swelling and ear pressure./No fever/This started last Wed)  Patient complains of cough, head/chest congestion, ear pain/pressure, facial pain/pressure, wheezing, and swollen lymph nodes. She denies fever. Onset of symptoms was 5 days ago and have significantly worsened. She is drinking plenty of fluids. Evaluation to date: none. Treatment to date:  Zycam, inhalers, nebulizer . She has a history of asthma. She does smoke.     ROS: Negative unless specifically indicated above in HPI.   Relevant past medical history reviewed and updated as indicated.   Allergies and medications reviewed and updated.   Current Outpatient Medications:    albuterol (PROVENTIL) (2.5 MG/3ML) 0.083% nebulizer solution, Take 3 mLs (2.5 mg total) by nebulization every 4 (four) hours as needed for wheezing or shortness of breath., Disp: 450 mL, Rfl: 1   albuterol (VENTOLIN HFA) 108  (90 Base) MCG/ACT inhaler, Inhale 1-2 puffs into the lungs every 6 (six) hours as needed for wheezing or shortness of breath., Disp: 8.5 g, Rfl: 5   busPIRone (BUSPAR) 7.5 MG tablet, Take 1 tablet (7.5 mg total) by mouth 2 (two) times daily., Disp: 180 tablet, Rfl: 3   Calcium Carbonate-Simethicone 750-80 MG CHEW, Chew 1 tablet by mouth 3 times/day as needed-between meals & bedtime., Disp: , Rfl:    cyclobenzaprine (FLEXERIL) 10 MG tablet, Take 10 mg by mouth daily as needed for muscle spasms., Disp: , Rfl:    diclofenac Sodium (VOLTAREN) 1 % GEL, SMARTSIG:2 Gram(s) Topical 4 Times Daily PRN, Disp: , Rfl:    dicyclomine (BENTYL) 20 MG tablet, TAKE 1 TABLET BY MOUTH EVERY MORNING, AT NOON, EACH EVENING AND AT BEDTIME, Disp: 120 tablet, Rfl: 2   fluticasone (FLONASE) 50 MCG/ACT nasal spray, Place 2 sprays into both nostrils daily., Disp: 48 g, Rfl: 2   fluticasone (FLOVENT HFA) 110 MCG/ACT inhaler, INHALE TWO PUFFS BY MOUTH INTO LUNGS BY MOUTH TWICE DAILY, Disp: 36 g, Rfl: 11   loratadine (CLARITIN) 10 MG tablet, Take 1 tablet (10 mg total) by mouth daily., Disp: 90 tablet, Rfl: 3   methocarbamol (ROBAXIN) 750 MG tablet, Take 750 mg by mouth every 8 (eight) hours as needed for muscle spasms., Disp: , Rfl:    montelukast (SINGULAIR) 10 MG tablet, TAKE ONE TABLET BY MOUTH EVERYDAY AT BEDTIME Annual appt due in July, must see provider for further refills, Disp: 90 tablet, Rfl: 3   Olopatadine HCl 0.2 % SOLN, Apply 1-2 drops to eye 2 (two) times daily as needed (  itching, running eyes)., Disp: 5 mL, Rfl: 2   oxyCODONE-acetaminophen (PERCOCET) 10-325 MG tablet, Take 1 tablet by mouth every 6 (six) hours as needed for pain., Disp: , Rfl:    pantoprazole (PROTONIX) 40 MG tablet, Take 1 tablet (40 mg total) by mouth daily., Disp: 90 tablet, Rfl: 3   polyethylene glycol powder (GLYCOLAX/MIRALAX) 17 GM/SCOOP powder, Take 17 g by mouth 2 (two) times daily as needed., Disp: 3350 g, Rfl: 1   pregabalin (LYRICA) 150  MG capsule, Take 150 mg by mouth 2 (two) times daily., Disp: , Rfl:    PROCTOZONE-HC 2.5 % rectal cream, APPLY RECTALLY TWICE DAILY AS DIRECTED *REFILL REQUEST*, Disp: 28 g, Rfl: 10   senna-docusate (STIMULANT LAXATIVE) 8.6-50 MG tablet, Take 1 tablet by mouth daily., Disp: 90 tablet, Rfl: 3   ondansetron (ZOFRAN-ODT) 4 MG disintegrating tablet, Take 4 mg by mouth every 8 (eight) hours as needed for vomiting or nausea. (Patient not taking: Reported on 09/30/2023), Disp: , Rfl:    Oxycodone HCl 10 MG TABS, Take 10 mg by mouth 4 (four) times daily as needed., Disp: , Rfl:    tamoxifen (NOLVADEX) 20 MG tablet, Take 1 tablet (20 mg total) by mouth daily. (Patient not taking: Reported on 09/30/2023), Disp: 90 tablet, Rfl: 4   tiZANidine (ZANAFLEX) 4 MG tablet, Take 4 mg by mouth as needed. (Patient not taking: Reported on 09/30/2023), Disp: , Rfl:    traZODone (DESYREL) 50 MG tablet, Take 1 tablet (50 mg total) by mouth at bedtime. (Patient not taking: Reported on 09/30/2023), Disp: 90 tablet, Rfl: 1   venlafaxine XR (EFFEXOR-XR) 37.5 MG 24 hr capsule, Take 37.5 mg by mouth daily with breakfast., Disp: , Rfl:   Allergies  Allergen Reactions   Other Hives    Dermabond   Wound Dressing Adhesive Rash   Morphine Other (See Comments)    Drops BP    Morphine And Codeine     Lowers BP too much   Tape Dermatitis    Objective:   BP 118/78   Pulse 91   Temp 98.4 F (36.9 C)   Resp (!) 22   Ht 4\' 11"  (1.499 m)   Wt 184 lb (83.5 kg)   LMP 07/20/2017   SpO2 95%   BMI 37.16 kg/m    Physical Exam Vitals reviewed.  Constitutional:      General: She is not in acute distress.    Appearance: Normal appearance. She is not ill-appearing, toxic-appearing or diaphoretic.  HENT:     Head: Normocephalic and atraumatic.     Right Ear: Tympanic membrane, ear canal and external ear normal. There is no impacted cerumen.     Left Ear: Tympanic membrane, ear canal and external ear normal. There is no  impacted cerumen.     Nose: No congestion or rhinorrhea.     Right Sinus: Frontal sinus tenderness present. No maxillary sinus tenderness.     Left Sinus: Frontal sinus tenderness present. No maxillary sinus tenderness.     Mouth/Throat:     Mouth: Mucous membranes are moist.     Pharynx: Posterior oropharyngeal erythema present. No oropharyngeal exudate.     Tonsils: Tonsillar exudate present. 2+ on the right. 2+ on the left.  Eyes:     General: No scleral icterus.       Right eye: No discharge.        Left eye: No discharge.     Conjunctiva/sclera: Conjunctivae normal.  Cardiovascular:     Rate  and Rhythm: Normal rate and regular rhythm.     Heart sounds: Normal heart sounds. No murmur heard.    No friction rub. No gallop.  Pulmonary:     Effort: Pulmonary effort is normal. No respiratory distress.     Breath sounds: Normal breath sounds. No stridor. No wheezing, rhonchi or rales.  Musculoskeletal:        General: Normal range of motion.     Cervical back: Normal range of motion.  Lymphadenopathy:     Cervical: Cervical adenopathy present.  Skin:    General: Skin is warm and dry.     Capillary Refill: Capillary refill takes less than 2 seconds.  Neurological:     General: No focal deficit present.     Mental Status: She is alert and oriented to person, place, and time. Mental status is at baseline.  Psychiatric:        Mood and Affect: Mood normal.        Behavior: Behavior normal.        Thought Content: Thought content normal.        Judgment: Judgment normal.

## 2023-10-03 ENCOUNTER — Other Ambulatory Visit: Payer: Self-pay | Admitting: Internal Medicine

## 2023-10-08 ENCOUNTER — Other Ambulatory Visit: Payer: Self-pay | Admitting: Family Medicine

## 2023-10-08 DIAGNOSIS — J0110 Acute frontal sinusitis, unspecified: Secondary | ICD-10-CM

## 2023-10-08 DIAGNOSIS — J4541 Moderate persistent asthma with (acute) exacerbation: Secondary | ICD-10-CM

## 2023-10-11 ENCOUNTER — Telehealth: Payer: Self-pay | Admitting: Internal Medicine

## 2023-10-11 ENCOUNTER — Encounter: Payer: Self-pay | Admitting: Internal Medicine

## 2023-10-11 NOTE — Telephone Encounter (Signed)
Patient saw Deliah Boston on 09/30/23 and was prescribed medication to help with her symptoms. Patient said she still has a cough and would like to know what Dr. Okey Dupre would advise. Best callback is [phone].

## 2023-10-11 NOTE — Telephone Encounter (Signed)
Amoxicillin

## 2023-10-11 NOTE — Telephone Encounter (Signed)
I do not see information about it symptoms have improved/stayed the same/worsened and which symptoms she is still having

## 2023-10-14 ENCOUNTER — Encounter: Payer: Self-pay | Admitting: Family Medicine

## 2023-10-14 ENCOUNTER — Telehealth (INDEPENDENT_AMBULATORY_CARE_PROVIDER_SITE_OTHER): Payer: 59 | Admitting: Family Medicine

## 2023-10-14 VITALS — BP 98/60 | HR 98 | Temp 98.1°F | Ht 59.0 in | Wt 184.0 lb

## 2023-10-14 DIAGNOSIS — B379 Candidiasis, unspecified: Secondary | ICD-10-CM | POA: Diagnosis not present

## 2023-10-14 DIAGNOSIS — T3695XA Adverse effect of unspecified systemic antibiotic, initial encounter: Secondary | ICD-10-CM | POA: Diagnosis not present

## 2023-10-14 DIAGNOSIS — J988 Other specified respiratory disorders: Secondary | ICD-10-CM | POA: Diagnosis not present

## 2023-10-14 DIAGNOSIS — B9689 Other specified bacterial agents as the cause of diseases classified elsewhere: Secondary | ICD-10-CM | POA: Diagnosis not present

## 2023-10-14 MED ORDER — FLUCONAZOLE 150 MG PO TABS: 150.0000 mg | ORAL_TABLET | Freq: Once | ORAL | 0 refills | Status: AC

## 2023-10-14 MED ORDER — AZITHROMYCIN 250 MG PO TABS: ORAL_TABLET | ORAL | 0 refills | Status: AC

## 2023-10-14 MED ORDER — PROMETHAZINE-DM 6.25-15 MG/5ML PO SYRP: 5.0000 mL | ORAL_SOLUTION | Freq: Four times a day (QID) | ORAL | 0 refills | Status: AC | PRN

## 2023-10-14 NOTE — Telephone Encounter (Signed)
Called pt - has appt today for video visit

## 2023-10-14 NOTE — Progress Notes (Signed)
Virtual Visit via Video Note  I connected with Emily [name] on 10/14/23 at 11:20 AM EST by a video enabled telemedicine application and verified that I am speaking with the correct person using two identifiers.  Patient Location: Home Provider Location: Home Office  I discussed the limitations, risks, security, and privacy concerns of performing an evaluation and management service by video and the availability of in person appointments. I also discussed with the patient that there may be a patient responsible charge related to this service. The patient expressed understanding and agreed to proceed.  Subjective: PCP: Myrlene Broker, MD  Chief Complaint  Patient presents with   Cough    Cough (still from 11/11 visit, but got worse again on Friday) Some congestion - using all inhalers as prescribed.    Vaginitis    States she thinks this from recent antibiotic    Patient complains of cough, head/chest congestion, headache, facial pain/pressure, wheezing, and extreme fatigue . She denies fever. Onset of symptoms was 3 weeks ago, which initially improved slight, but then worsened again. She is drinking plenty of fluids. Evaluation to date: negative at home COVID test this weekend. She was previously seen on 09/30/2023 at which time she was diagnosed with a sinus infection and asthma exacerbation.  She was treated with Augmentin and prednisone she has continued using her Flovent inhaler and albuterol 1-2 times per day.  She has not taken anything for her cough.  She has a history of asthma. She does not smoke.   She is also concerned about a yeast infection since taking the antibiotic 2 weeks ago.  She is experiencing burning and itching.  States she is unsure if she is having discharge because she pees every time she coughs and is having to wear a pad.    ROS: Per HPI  Current Outpatient Medications:    albuterol (PROVENTIL) (2.5 MG/3ML) 0.083% nebulizer solution, Take  3 mLs (2.5 mg total) by nebulization every 4 (four) hours as needed for wheezing or shortness of breath., Disp: 450 mL, Rfl: 1   albuterol (VENTOLIN HFA) 108 (90 Base) MCG/ACT inhaler, Inhale 1-2 puffs into the lungs every 6 (six) hours as needed for wheezing or shortness of breath., Disp: 8.5 g, Rfl: 5   busPIRone (BUSPAR) 7.5 MG tablet, Take 1 tablet (7.5 mg total) by mouth 2 (two) times daily., Disp: 180 tablet, Rfl: 3   Calcium Carbonate-Simethicone 750-80 MG CHEW, Chew 1 tablet by mouth 3 times/day as needed-between meals & bedtime., Disp: , Rfl:    cyclobenzaprine (FLEXERIL) 10 MG tablet, Take 10 mg by mouth daily as needed for muscle spasms., Disp: , Rfl:    diclofenac Sodium (VOLTAREN) 1 % GEL, SMARTSIG:2 Gram(s) Topical 4 Times Daily PRN, Disp: , Rfl:    dicyclomine (BENTYL) 20 MG tablet, TAKE 1 TABLET BY MOUTH EVERY MORNING, AT NOON, EACH EVENING AND AT BEDTIME, Disp: 120 tablet, Rfl: 2   escitalopram (LEXAPRO) 20 MG tablet, TAKE 1 TABLET BY MOUTH ONCE DAILY, Disp: 30 tablet, Rfl: 10   fluticasone (FLONASE) 50 MCG/ACT nasal spray, Place 2 sprays into both nostrils daily., Disp: 48 g, Rfl: 2   fluticasone (FLOVENT HFA) 110 MCG/ACT inhaler, INHALE TWO PUFFS BY MOUTH INTO LUNGS BY MOUTH TWICE DAILY, Disp: 36 g, Rfl: 11   loratadine (CLARITIN) 10 MG tablet, Take 1 tablet (10 mg total) by mouth daily., Disp: 90 tablet, Rfl: 3   methocarbamol (ROBAXIN) 750 MG tablet, Take 750 mg by mouth every  8 (eight) hours as needed for muscle spasms., Disp: , Rfl:    montelukast (SINGULAIR) 10 MG tablet, TAKE ONE TABLET BY MOUTH EVERYDAY AT BEDTIME Annual appt due in July, must see provider for further refills, Disp: 90 tablet, Rfl: 3   Olopatadine HCl 0.2 % SOLN, Apply 1-2 drops to eye 2 (two) times daily as needed (itching, running eyes)., Disp: 5 mL, Rfl: 2   Oxycodone HCl 10 MG TABS, Take 10 mg by mouth 4 (four) times daily as needed., Disp: , Rfl:    oxyCODONE-acetaminophen (PERCOCET) 10-325 MG tablet,  Take 1 tablet by mouth every 6 (six) hours as needed for pain., Disp: , Rfl:    pantoprazole (PROTONIX) 40 MG tablet, Take 1 tablet (40 mg total) by mouth daily., Disp: 90 tablet, Rfl: 3   polyethylene glycol powder (GLYCOLAX/MIRALAX) 17 GM/SCOOP powder, Take 17 g by mouth 2 (two) times daily as needed., Disp: 3350 g, Rfl: 1   pregabalin (LYRICA) 150 MG capsule, Take 150 mg by mouth 2 (two) times daily., Disp: , Rfl:    senna-docusate (STIMULANT LAXATIVE) 8.6-50 MG tablet, Take 1 tablet by mouth daily., Disp: 90 tablet, Rfl: 3   tiZANidine (ZANAFLEX) 4 MG tablet, Take 4 mg by mouth as needed., Disp: , Rfl:    traZODone (DESYREL) 50 MG tablet, Take 1 tablet (50 mg total) by mouth at bedtime., Disp: 90 tablet, Rfl: 1   venlafaxine XR (EFFEXOR-XR) 37.5 MG 24 hr capsule, Take 37.5 mg by mouth daily with breakfast., Disp: , Rfl:    ondansetron (ZOFRAN-ODT) 4 MG disintegrating tablet, Take 4 mg by mouth every 8 (eight) hours as needed for vomiting or nausea. (Patient not taking: Reported on 10/14/2023), Disp: , Rfl:    PROCTOZONE-HC 2.5 % rectal cream, APPLY RECTALLY TWICE DAILY AS DIRECTED *REFILL REQUEST* (Patient not taking: Reported on 10/14/2023), Disp: 28 g, Rfl: 10   tamoxifen (NOLVADEX) 20 MG tablet, Take 1 tablet (20 mg total) by mouth daily. (Patient not taking: Reported on 10/14/2023), Disp: 90 tablet, Rfl: 4  Observations/Objective: Today's Vitals   10/14/23 1122  BP: 98/60  Pulse: 98  Temp: 98.1 F (36.7 C)  Weight: 184 lb (83.5 kg)  Height: 4\' 11"  (1.499 m)   Physical Exam Constitutional:      General: She is not in acute distress.    Appearance: Normal appearance. She is not ill-appearing or toxic-appearing.  Eyes:     General: No scleral icterus.       Right eye: No discharge.        Left eye: No discharge.     Conjunctiva/sclera: Conjunctivae normal.  Pulmonary:     Effort: Pulmonary effort is normal. No respiratory distress.  Neurological:     Mental Status: She is alert  and oriented to person, place, and time.  Psychiatric:        Mood and Affect: Mood normal.        Behavior: Behavior normal.        Thought Content: Thought content normal.        Judgment: Judgment normal.     Assessment and Plan: 1. Bacterial respiratory infection Encouraged to use albuterol inhaler 3-4 times per day scheduled while she is trying to get over this respiratory illness.  Discussed symptom management. - azithromycin (ZITHROMAX) 250 MG tablet; Take 2 tablets (500 mg total) by mouth daily for 1 day, THEN 1 tablet (250 mg total) daily for 4 days.  Dispense: 6 each; Refill: 0 - promethazine-dextromethorphan (PROMETHAZINE-DM) 6.25-15  MG/5ML syrup; Take 5 mLs by mouth 4 (four) times daily as needed for cough.  Dispense: 118 mL; Refill: 0  2. Antibiotic-induced yeast infection - fluconazole (DIFLUCAN) 150 MG tablet; Take 1 tablet (150 mg total) by mouth once for 1 dose. May repeat after 3 days if needed.  Dispense: 2 tablet; Refill: 0    Follow Up Instructions: Return if symptoms worsen or fail to improve.   I discussed the assessment and treatment plan with the patient. The patient was provided an opportunity to ask questions, and all were answered. The patient agreed with the plan and demonstrated an understanding of the instructions.   The patient was advised to call back or seek an in-person evaluation if the symptoms worsen or if the condition fails to improve as anticipated.  The above assessment and management plan was discussed with the patient. The patient verbalized understanding of and has agreed to the management plan.   Gwenlyn Fudge, FNP

## 2023-10-14 NOTE — Telephone Encounter (Signed)
Pt is coming in today to see Emily [name] she has also sent a MyChart message that I have forward to Emily [name] as well

## 2023-10-20 DIAGNOSIS — J4520 Mild intermittent asthma, uncomplicated: Secondary | ICD-10-CM | POA: Diagnosis not present

## 2023-10-21 ENCOUNTER — Other Ambulatory Visit: Payer: Self-pay | Admitting: Internal Medicine

## 2023-10-21 DIAGNOSIS — J4520 Mild intermittent asthma, uncomplicated: Secondary | ICD-10-CM

## 2023-10-23 DIAGNOSIS — Z79891 Long term (current) use of opiate analgesic: Secondary | ICD-10-CM | POA: Diagnosis not present

## 2023-10-23 DIAGNOSIS — G8929 Other chronic pain: Secondary | ICD-10-CM | POA: Diagnosis not present

## 2023-10-23 DIAGNOSIS — M5416 Radiculopathy, lumbar region: Secondary | ICD-10-CM | POA: Diagnosis not present

## 2023-11-26 DIAGNOSIS — Z1231 Encounter for screening mammogram for malignant neoplasm of breast: Secondary | ICD-10-CM | POA: Diagnosis not present

## 2023-11-26 DIAGNOSIS — R92333 Mammographic heterogeneous density, bilateral breasts: Secondary | ICD-10-CM | POA: Diagnosis not present

## 2023-11-27 DIAGNOSIS — G8929 Other chronic pain: Secondary | ICD-10-CM | POA: Diagnosis not present

## 2023-11-27 DIAGNOSIS — M542 Cervicalgia: Secondary | ICD-10-CM | POA: Diagnosis not present

## 2023-11-27 DIAGNOSIS — Z79891 Long term (current) use of opiate analgesic: Secondary | ICD-10-CM | POA: Diagnosis not present

## 2023-12-10 DIAGNOSIS — I972 Postmastectomy lymphedema syndrome: Secondary | ICD-10-CM | POA: Diagnosis not present

## 2023-12-10 DIAGNOSIS — R232 Flushing: Secondary | ICD-10-CM | POA: Diagnosis not present

## 2023-12-10 DIAGNOSIS — Z5181 Encounter for therapeutic drug level monitoring: Secondary | ICD-10-CM | POA: Diagnosis not present

## 2023-12-10 DIAGNOSIS — R92333 Mammographic heterogeneous density, bilateral breasts: Secondary | ICD-10-CM | POA: Diagnosis not present

## 2023-12-10 DIAGNOSIS — Z5320 Procedure and treatment not carried out because of patient's decision for unspecified reasons: Secondary | ICD-10-CM | POA: Diagnosis not present

## 2023-12-10 DIAGNOSIS — N6091 Unspecified benign mammary dysplasia of right breast: Secondary | ICD-10-CM | POA: Diagnosis not present

## 2023-12-10 DIAGNOSIS — Z1231 Encounter for screening mammogram for malignant neoplasm of breast: Secondary | ICD-10-CM | POA: Diagnosis not present

## 2023-12-10 DIAGNOSIS — C50911 Malignant neoplasm of unspecified site of right female breast: Secondary | ICD-10-CM | POA: Diagnosis not present

## 2023-12-10 DIAGNOSIS — F17210 Nicotine dependence, cigarettes, uncomplicated: Secondary | ICD-10-CM | POA: Diagnosis not present

## 2023-12-20 ENCOUNTER — Other Ambulatory Visit: Payer: Self-pay | Admitting: Internal Medicine

## 2023-12-23 DIAGNOSIS — M542 Cervicalgia: Secondary | ICD-10-CM | POA: Diagnosis not present

## 2023-12-23 DIAGNOSIS — M6281 Muscle weakness (generalized): Secondary | ICD-10-CM | POA: Diagnosis not present

## 2023-12-23 DIAGNOSIS — M5459 Other low back pain: Secondary | ICD-10-CM | POA: Diagnosis not present

## 2023-12-26 DIAGNOSIS — M5459 Other low back pain: Secondary | ICD-10-CM | POA: Diagnosis not present

## 2023-12-26 DIAGNOSIS — M542 Cervicalgia: Secondary | ICD-10-CM | POA: Diagnosis not present

## 2023-12-26 DIAGNOSIS — M6281 Muscle weakness (generalized): Secondary | ICD-10-CM | POA: Diagnosis not present

## 2023-12-27 DIAGNOSIS — M4326 Fusion of spine, lumbar region: Secondary | ICD-10-CM | POA: Diagnosis not present

## 2023-12-27 DIAGNOSIS — M7918 Myalgia, other site: Secondary | ICD-10-CM | POA: Diagnosis not present

## 2023-12-27 DIAGNOSIS — M4322 Fusion of spine, cervical region: Secondary | ICD-10-CM | POA: Diagnosis not present

## 2023-12-30 DIAGNOSIS — M542 Cervicalgia: Secondary | ICD-10-CM | POA: Diagnosis not present

## 2023-12-30 DIAGNOSIS — M6281 Muscle weakness (generalized): Secondary | ICD-10-CM | POA: Diagnosis not present

## 2023-12-30 DIAGNOSIS — M5459 Other low back pain: Secondary | ICD-10-CM | POA: Diagnosis not present

## 2023-12-31 DIAGNOSIS — M542 Cervicalgia: Secondary | ICD-10-CM | POA: Diagnosis not present

## 2023-12-31 DIAGNOSIS — Z79891 Long term (current) use of opiate analgesic: Secondary | ICD-10-CM | POA: Diagnosis not present

## 2023-12-31 DIAGNOSIS — G8929 Other chronic pain: Secondary | ICD-10-CM | POA: Diagnosis not present

## 2024-01-01 DIAGNOSIS — C50911 Malignant neoplasm of unspecified site of right female breast: Secondary | ICD-10-CM | POA: Diagnosis not present

## 2024-01-14 ENCOUNTER — Other Ambulatory Visit: Payer: Self-pay | Admitting: Internal Medicine

## 2024-01-28 DIAGNOSIS — M5412 Radiculopathy, cervical region: Secondary | ICD-10-CM | POA: Diagnosis not present

## 2024-02-04 DIAGNOSIS — R339 Retention of urine, unspecified: Secondary | ICD-10-CM | POA: Diagnosis not present

## 2024-03-05 DIAGNOSIS — M542 Cervicalgia: Secondary | ICD-10-CM | POA: Diagnosis not present

## 2024-03-05 DIAGNOSIS — Z79891 Long term (current) use of opiate analgesic: Secondary | ICD-10-CM | POA: Diagnosis not present

## 2024-03-05 DIAGNOSIS — G8929 Other chronic pain: Secondary | ICD-10-CM | POA: Diagnosis not present

## 2024-04-09 DIAGNOSIS — M542 Cervicalgia: Secondary | ICD-10-CM | POA: Diagnosis not present

## 2024-04-09 DIAGNOSIS — G8929 Other chronic pain: Secondary | ICD-10-CM | POA: Diagnosis not present

## 2024-04-09 DIAGNOSIS — Z79891 Long term (current) use of opiate analgesic: Secondary | ICD-10-CM | POA: Diagnosis not present

## 2024-05-07 DIAGNOSIS — M542 Cervicalgia: Secondary | ICD-10-CM | POA: Diagnosis not present

## 2024-05-07 DIAGNOSIS — Z79891 Long term (current) use of opiate analgesic: Secondary | ICD-10-CM | POA: Diagnosis not present

## 2024-05-07 DIAGNOSIS — G8929 Other chronic pain: Secondary | ICD-10-CM | POA: Diagnosis not present

## 2024-06-01 ENCOUNTER — Ambulatory Visit (INDEPENDENT_AMBULATORY_CARE_PROVIDER_SITE_OTHER): Payer: 59

## 2024-06-01 VITALS — Ht 59.0 in | Wt 186.0 lb

## 2024-06-01 DIAGNOSIS — Z5986 Financial insecurity: Secondary | ICD-10-CM | POA: Diagnosis not present

## 2024-06-01 DIAGNOSIS — Z0000 Encounter for general adult medical examination without abnormal findings: Secondary | ICD-10-CM | POA: Diagnosis not present

## 2024-06-01 DIAGNOSIS — H9193 Unspecified hearing loss, bilateral: Secondary | ICD-10-CM | POA: Diagnosis not present

## 2024-06-01 NOTE — Patient Instructions (Addendum)
 Ms. All , Thank you for taking time out of your busy schedule to complete your Annual Wellness Visit with me. I enjoyed our conversation and look forward to speaking with you again next year. I, as well as your care team,  appreciate your ongoing commitment to your health goals. Please review the following plan we discussed and let me know if I can assist you in the future. Your Game plan/ To Do List    Referrals: If you haven't heard from the office you've been referred to, please reach out to them at the phone provided.  VBCI Referral to Pharmacy to assist w/cost of meds; Referral to an Audiologist due to difficulty hearing Follow up Visits: Next Medicare AWV with our clinical staff: 06/04/2025   Have you seen your provider in the last 6 months (3 months if uncontrolled diabetes)? No Next Office Visit with your provider: 06/19/2024  Clinician Recommendations:  Aim for 30 minutes of exercise or brisk walking, 6-8 glasses of water, and 5 servings of fruits and vegetables each day. Educated and advised on getting the Hepatitis B, Shingles, and COVID vaccines in 2025.      This is a list of the screening recommended for you and due dates:  Health Maintenance  Topic Date Due   Hepatitis B Vaccine (1 of 3 - 19+ 3-dose series) Never done   Zoster (Shingles) Vaccine (1 of 2) Never done   COVID-19 Vaccine (4 - 2024-25 season) 07/21/2023   Flu Shot  06/19/2024   Mammogram  10/17/2024   Medicare Annual Wellness Visit  06/01/2025   DTaP/Tdap/Td vaccine (3 - Td or Tdap) 07/18/2030   Colon Cancer Screening  12/19/2032   Pneumococcal Vaccination  Completed   Hepatitis C Screening  Completed   HIV Screening  Completed   HPV Vaccine  Aged Out   Meningitis B Vaccine  Aged Out    Advanced directives: (Declined) Advance directive discussed with you today. Even though you declined this today, please call our office should you change your mind, and we can give you the proper paperwork for you to fill  out. Advance Care Planning is important because it:  [x]  Makes sure you receive the medical care that is consistent with your values, goals, and preferences  [x]  It provides guidance to your family and loved ones and reduces their decisional burden about whether or not they are making the right decisions based on your wishes.  Follow the link provided in your after visit summary or read over the paperwork we have mailed to you to help you started getting your Advance Directives in place. If you need assistance in completing these, please reach out to us  so that we can help you!

## 2024-06-01 NOTE — Progress Notes (Signed)
 Subjective:   Emily [name] is a 53 y.o. who presents for a Medicare Wellness preventive visit.  As a reminder, Annual Wellness Visits don't include a physical exam, and some assessments may be limited, especially if this visit is performed virtually. We may recommend an in-person follow-up visit with your provider if needed.  Visit Complete: Virtual I connected with  Emily [name] on 06/01/24 by a audio enabled telemedicine application and verified that I am speaking with the correct person using two identifiers.  Patient Location: Home  Provider Location: Office/Clinic  I discussed the limitations of evaluation and management by telemedicine. The patient expressed understanding and agreed to proceed.  Vital Signs: Because this visit was a virtual/telehealth visit, some criteria may be missing or patient reported. Any vitals not documented were not able to be obtained and vitals that have been documented are patient reported.  VideoDeclined- This patient declined Librarian, academic. Therefore the visit was completed with audio only.  Persons Participating in Visit: Patient.  AWV Questionnaire: No: Patient Medicare AWV questionnaire was not completed prior to this visit.  Cardiac Risk Factors include: advanced age (>70men, >5 women);hypertension;obesity (BMI >30kg/m2)     Objective:    Today's Vitals   06/01/24 1135  Weight: 186 lb (84.4 kg)  Height: 4' 11 (1.499 m)   Body mass index is 37.57 kg/m.     06/01/2024   11:35 AM 05/30/2023   10:57 AM 01/16/2021    1:45 PM 07/14/2020   10:36 AM 09/23/2019    8:37 AM 09/16/2019    1:49 PM 08/05/2019   11:17 AM  Advanced Directives  Does Patient Have a Medical Advance Directive? No No No No No No No  Does patient want to make changes to medical advance directive?       No - Patient declined  Would patient like information on creating a medical advance directive? No - Patient  declined Yes (MAU/Ambulatory/Procedural Areas - Information given) Yes (MAU/Ambulatory/Procedural Areas - Information given) Yes (MAU/Ambulatory/Procedural Areas - Information given) No - Patient declined Yes (MAU/Ambulatory/Procedural Areas - Information given)     Current Medications (verified) Outpatient Encounter Medications as of 06/01/2024  Medication Sig   albuterol  (PROVENTIL ) (2.5 MG/3ML) 0.083% nebulizer solution INHALE THE CONTENTS OF 1 VIAL VIA NEBULIZER EVERY 4 HOURS AS NEEDED FOR WHEEZING OR FOR SHORTNESS OF BREATH   albuterol  (VENTOLIN  HFA) 108 (90 Base) MCG/ACT inhaler INHALE 1-2 PUFFS INTO THE LUNGS EVERY 6 HOURS AS NEEDED FOR WHEEZING AND SHORTNESS OF BREATH   busPIRone  (BUSPAR ) 7.5 MG tablet Take 1 tablet (7.5 mg total) by mouth 2 (two) times daily.   Calcium  Carbonate-Simethicone 750-80 MG CHEW Chew 1 tablet by mouth 3 times/day as needed-between meals & bedtime.   cyclobenzaprine (FLEXERIL) 10 MG tablet Take 10 mg by mouth daily as needed for muscle spasms.   diclofenac Sodium (VOLTAREN) 1 % GEL SMARTSIG:2 Gram(s) Topical 4 Times Daily PRN   dicyclomine  (BENTYL ) 20 MG tablet TAKE 1 TABLET BY MOUTH EVERY MORNING, AT NOON, EACH EVENING AND AT BEDTIME   escitalopram  (LEXAPRO ) 20 MG tablet TAKE 1 TABLET BY MOUTH ONCE DAILY   fluticasone  (FLONASE ) 50 MCG/ACT nasal spray Place 2 sprays into both nostrils daily.   fluticasone  (FLOVENT  HFA) 110 MCG/ACT inhaler INHALE TWO PUFFS BY MOUTH INTO LUNGS BY MOUTH TWICE DAILY   loratadine  (CLARITIN ) 10 MG tablet Take 1 tablet (10 mg total) by mouth daily.   methocarbamol  (ROBAXIN ) 750 MG tablet Take 750 mg  by mouth every 8 (eight) hours as needed for muscle spasms.   montelukast  (SINGULAIR ) 10 MG tablet TAKE ONE TABLET BY MOUTH EVERYDAY AT BEDTIME Annual appt due in July, must see provider for further refills   Olopatadine  HCl 0.2 % SOLN Apply 1-2 drops to eye 2 (two) times daily as needed (itching, running eyes).   Oxycodone  HCl 10 MG TABS  Take 10 mg by mouth 4 (four) times daily as needed.   pantoprazole  (PROTONIX ) 40 MG tablet Take 1 tablet (40 mg total) by mouth daily.   polyethylene glycol powder (GLYCOLAX /MIRALAX ) 17 GM/SCOOP powder Take 17 g by mouth 2 (two) times daily as needed.   pregabalin (LYRICA) 150 MG capsule Take 150 mg by mouth 2 (two) times daily.   promethazine -dextromethorphan (PROMETHAZINE -DM) 6.25-15 MG/5ML syrup Take 5 mLs by mouth 4 (four) times daily as needed for cough.   senna-docusate (STIMULANT LAXATIVE) 8.6-50 MG tablet Take 1 tablet by mouth daily.   tiZANidine (ZANAFLEX) 4 MG tablet Take 4 mg by mouth as needed.   traZODone  (DESYREL ) 50 MG tablet TAKE 1 TABLET BY MOUTH AT BEDTIME   venlafaxine XR (EFFEXOR-XR) 37.5 MG 24 hr capsule Take 37.5 mg by mouth daily with breakfast.   ondansetron  (ZOFRAN -ODT) 4 MG disintegrating tablet Take 4 mg by mouth every 8 (eight) hours as needed for vomiting or nausea. (Patient not taking: Reported on 06/01/2024)   oxyCODONE -acetaminophen  (PERCOCET) 10-325 MG tablet Take 1 tablet by mouth every 6 (six) hours as needed for pain. (Patient not taking: Reported on 06/01/2024)   PROCTOZONE -HC 2.5 % rectal cream APPLY RECTALLY TWICE DAILY AS DIRECTED *REFILL REQUEST* (Patient not taking: Reported on 06/01/2024)   tamoxifen  (NOLVADEX ) 20 MG tablet Take 1 tablet (20 mg total) by mouth daily. (Patient not taking: Reported on 06/01/2024)   No facility-administered encounter medications on file as of 06/01/2024.    Allergies (verified) Other, Wound dressing adhesive, Morphine , Morphine  and codeine , and Tape   History: Past Medical History:  Diagnosis Date   Allergic rhinitis    Allergy    Anal fissure    Anemia    Anxiety    Arthritis    Asthma    uses inhaler as needed   Breast cancer (HCC)    Chronic neck pain    pain mgmt in [place]   Depression    Gallbladder disease    GERD (gastroesophageal reflux disease)    Headache(784.0)    HTN (hypertension)    BP has been  normal recently per pt and not taking meds   Hyperthyroidism    Obesity    PONV (postoperative nausea and vomiting)    Thyroid  nodule    incidental on MRI neck 2014, s/p endo eval   Uterine fibroid    Past Surgical History:  Procedure Laterality Date   BREAST LUMPECTOMY Right 2021   BREAST LUMPECTOMY WITH RADIOACTIVE SEED LOCALIZATION Right 09/23/2019   Procedure: RIGHT BREAST LUMPECTOMY WITH RADIOACTIVE SEED LOCALIZATION;  Surgeon: Vanderbilt Ned, MD;  Location: [facility];  Service: General;  Laterality: Right;   BREAST REDUCTION SURGERY Right    with partial mastectomy   CERVICAL SPINE SURGERY  2015   2 disc removed   Chirai Malformation  2003   CHOLECYSTECTOMY  1993   DILITATION & CURRETTAGE/HYSTROSCOPY WITH NOVASURE ABLATION N/A 03/15/2014   Procedure: DILATATION & CURETTAGE/HYSTEROSCOPY WITH NOVASURE ABLATION;  Surgeon: Charlie JINNY Flowers, MD;  Location: [facility];  Service: Gynecology;  Laterality: N/A;   Laberal tear  2001  Right Shoulder   LAPAROSCOPIC TOTAL HYSTERECTOMY  02/03/2018   Partial kept ovaries   LUMBAR DISC SURGERY     L4-L5   MOUTH SURGERY     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)   WISDOM TOOTH EXTRACTION  1992   Family History  Problem Relation Age of Onset   Breast cancer Mother        over 62 when dx   Diverticulitis Mother    Arthritis Mother    Hyperlipidemia Mother    Hypertension Mother    Pancreatic cancer Paternal Grandmother    Breast cancer Paternal Aunt        x 2 pat aunts   Irritable bowel syndrome Daughter    Hypotension Daughter    Alcohol abuse Son    Alcohol abuse Paternal Uncle    Alcohol abuse Maternal Uncle        x several   Colon cancer Other        maternal grandmothers sister   Social History   Socioeconomic History   Marital status: Divorced    Spouse name: Not on file   Number of children: 2   Years of education: Not on file   Highest education level: GED or equivalent  Occupational  History   Occupation: disabled  Tobacco Use   Smoking status: Every Day    Current packs/day: 1.00    Average packs/day: 1 pack/day for 18.0 years (18.0 ttl pk-yrs)    Types: Cigarettes   Smokeless tobacco: Never   Tobacco comments:    starting NRT patches/lozenges 03/2021  Vaping Use   Vaping status: Never Used  Substance and Sexual Activity   Alcohol use: No    Alcohol/week: 0.0 standard drinks of alcohol   Drug use: No   Sexual activity: Yes  Other Topics Concern   Not on file  Social History Narrative   Lives w/fiance and daughter.   On disability.   Social Drivers of Health   Financial Resource Strain: Medium Risk (06/01/2024)   Overall Financial Resource Strain (CARDIA)    Difficulty of Paying Living Expenses: Somewhat hard  Food Insecurity: No Food Insecurity (06/01/2024)   Hunger Vital Sign    Worried About Running Out of Food in the Last Year: Never true    Ran Out of Food in the Last Year: Never true  Recent Concern: Food Insecurity - Food Insecurity Present (06/01/2024)   Hunger Vital Sign    Worried About Running Out of Food in the Last Year: Sometimes true    Ran Out of Food in the Last Year: Sometimes true  Transportation Needs: No Transportation Needs (06/01/2024)   PRAPARE - Administrator, Civil Service (Medical): No    Lack of Transportation (Non-Medical): No  Physical Activity: Insufficiently Active (06/01/2024)   Exercise Vital Sign    Days of Exercise per Week: 5 days    Minutes of Exercise per Session: 10 min  Stress: Stress Concern Present (06/01/2024)   [employer] of Occupational Health - Occupational Stress Questionnaire    Feeling of Stress: To some extent  Social Connections: Socially Integrated (06/01/2024)   Social Connection and Isolation Panel    Frequency of Communication with Friends and Family: Twice a week    Frequency of Social Gatherings with Friends and Family: Once a week    Attends Religious Services: 1 to 4 times  per year    Active Member of [employer] or Organizations: Yes    Attends [employer]  or Organization Meetings: 1 to 4 times per year    Marital Status: Living with partner    Tobacco Counseling Ready to quit: Not Answered Counseling given: No Tobacco comments: starting NRT patches/lozenges 03/2021    Clinical Intake:  Pre-visit preparation completed: Yes  Pain : No/denies pain     BMI - recorded: 37.57 Nutritional Status: BMI > 30  Obese Nutritional Risks: None Diabetes: No  Lab Results  Component Value Date   HGBA1C 5.7 12/24/2022   HGBA1C 5.5 07/18/2020   HGBA1C 5.4 06/17/2018     How often do you need to have someone help you when you read instructions, pamphlets, or other written materials from your doctor or pharmacy?: 1 - Never  Interpreter Needed?: No  Information entered by :: Verdie Saba, CMA   Activities of Daily Living     06/01/2024   11:41 AM  In your present state of health, do you have any difficulty performing the following activities:  Hearing? 1  Comment referral to an Audiologist  Vision? 0  Difficulty concentrating or making decisions? 0  Walking or climbing stairs? 1  Comment uses a cane  Dressing or bathing? 0  Doing errands, shopping? 0  Preparing Food and eating ? N  Using the Toilet? N  In the past six months, have you accidently leaked urine? Y  Comment wears a pad  Do you have problems with loss of bowel control? N  Managing your Medications? N  Managing your Finances? N  Housekeeping or managing your Housekeeping? N    Patient Care Team: Rollene Almarie LABOR, MD as PCP - General (Internal Medicine) Darlean Ned, MD (Rehabilitation) Kassie Mallick, MD (Inactive) (Endocrinology) Gorge Ade, MD (Obstetrics and Gynecology) Anda Inocente FERNS, DO (Anesthesiology) Vanderbilt Ned, MD as Consulting Physician (General Surgery) Fate Morna SAILOR, [facility] (Inactive) as Pharmacist (Pharmacist) Lad[place], [facility] [place], [place] as Referring Physician  (Optometry)  I have updated your Care Teams any recent Medical Services you may have received from other providers in the past year.     Assessment:   This is a routine wellness examination for Emily [name].  Hearing/Vision screen Hearing Screening - Comments:: referral to an Audiologist Vision Screening - Comments:: Wears rx glasses - up to date with routine eye exams with Dr Ladora   Goals Addressed               This Visit's Progress     Patient Stated (pt-stated)        Patient stated she plans to watch her diet       Depression Screen     06/01/2024   11:42 AM 10/14/2023   11:23 AM 09/30/2023    3:23 PM 05/30/2023   11:04 AM 12/24/2022   11:10 AM 06/18/2022   10:41 AM 05/23/2022    1:34 PM  PHQ 2/9 Scores  PHQ - 2 Score 0 0 0 1 0 2 2  PHQ- 9 Score 3   1  9 8     Fall Risk     06/01/2024   11:42 AM 07/25/2023   10:29 AM 05/30/2023   10:59 AM 12/24/2022   11:09 AM 06/18/2022   10:41 AM  Fall Risk   Falls in the past year? 0 0 0 0 1  Number falls in past yr: 0 0 0 0 0  Injury with Fall? 0 0 0 0 1  Risk for fall due to : No Fall Risks;Impaired balance/gait  No Fall Risks    Follow up Falls  evaluation completed;Falls prevention discussed Falls evaluation completed Falls prevention discussed      MEDICARE RISK AT HOME:  Medicare Risk at Home Any stairs in or around the home?: No If so, are there any without handrails?: No Home free of loose throw rugs in walkways, pet beds, electrical cords, etc?: Yes Adequate lighting in your home to reduce risk of falls?: Yes Life alert?: No Use of a cane, walker or w/c?: Yes (uses a cane) Grab bars in the bathroom?: Yes Shower chair or bench in shower?: Yes Elevated toilet seat or a handicapped toilet?: Yes  TIMED UP AND GO:  Was the test performed?  No  Cognitive Function: 6CIT completed        06/01/2024   11:48 AM 05/30/2023   11:03 AM 05/23/2022    1:49 PM  6CIT Screen  What Year? 0 points 0 points 0 points  What month? 0  points 0 points 0 points  What time? 0 points 0 points 0 points  Count back from 20 0 points 0 points 0 points  Months in reverse 0 points 0 points 0 points  Repeat phrase 0 points 0 points 0 points  Total Score 0 points 0 points 0 points    Immunizations Immunization History  Administered Date(s) Administered   Influenza, Seasonal, Injecte, Preservative Fre 07/25/2023   Influenza,inj,Quad PF,6+ Mos 11/04/2013, 12/16/2015, 08/22/2016, 01/01/2018, 07/29/2018, 08/10/2019, 07/18/2020, 09/26/2022   Moderna Sars-Covid-2 Vaccination 02/04/2020, 03/04/2020, 02/03/2021   PNEUMOCOCCAL CONJUGATE-20 07/17/2021, 09/26/2022   Td 03/22/2010   Tdap 07/18/2020    Screening Tests Health Maintenance  Topic Date Due   Hepatitis B Vaccines (1 of 3 - 19+ 3-dose series) Never done   Zoster Vaccines- Shingrix (1 of 2) Never done   COVID-19 Vaccine (4 - 2024-25 season) 07/21/2023   INFLUENZA VACCINE  06/19/2024   MAMMOGRAM  10/17/2024   Medicare Annual Wellness (AWV)  06/01/2025   DTaP/Tdap/Td (3 - Td or Tdap) 07/18/2030   Colonoscopy  12/19/2032   Pneumococcal Vaccine 86-29 Years old  Completed   Hepatitis C Screening  Completed   HIV Screening  Completed   HPV VACCINES  Aged Out   Meningococcal B Vaccine  Aged Out    Health Maintenance  Health Maintenance Due  Topic Date Due   Hepatitis B Vaccines (1 of 3 - 19+ 3-dose series) Never done   Zoster Vaccines- Shingrix (1 of 2) Never done   COVID-19 Vaccine (4 - 2024-25 season) 07/21/2023   Health Maintenance Items Addressed: 06/01/2024   Additional Screening:  Vision Screening: Recommended annual ophthalmology exams for early detection of glaucoma and other disorders of the eye. Would you like a referral to an eye doctor? No    Dental Screening: Recommended annual dental exams for proper oral hygiene  Community Resource Referral / Chronic Care Management: CRR required this visit?  Yes - VBCI Mgmt (Pharmacy) to assists with the cost of  medications.  CCM required this visit?  No   Plan:    I have personally reviewed and noted the following in the patient's chart:   Medical and social history Use of alcohol, tobacco or illicit drugs  Current medications and supplements including opioid prescriptions. Patient is currently taking opioid prescriptions. Information provided to patient regarding non-opioid alternatives. Patient advised to discuss non-opioid treatment plan with their provider. Functional ability and status Nutritional status Physical activity Advanced directives List of other physicians Hospitalizations, surgeries, and ER visits in previous 12 months Vitals Screenings to include cognitive, depression, and  falls Referrals and appointments  In addition, I have reviewed and discussed with patient certain preventive protocols, quality metrics, and best practice recommendations. A written personalized care plan for preventive services as well as general preventive health recommendations were provided to patient.   Verdie CHRISTELLA Saba, CMA   06/01/2024   After Visit Summary: (MyChart) Due to this being a telephonic visit, the after visit summary with patients personalized plan was offered to patient via MyChart   Notes: VBCI Referral to assist w/cost of meds; Referral to an Audiologist due to exp. Difficulty hearing.

## 2024-06-10 ENCOUNTER — Other Ambulatory Visit: Payer: Self-pay | Admitting: Family Medicine

## 2024-06-15 ENCOUNTER — Ambulatory Visit: Attending: Internal Medicine | Admitting: Audiology

## 2024-06-15 DIAGNOSIS — H9313 Tinnitus, bilateral: Secondary | ICD-10-CM | POA: Insufficient documentation

## 2024-06-15 DIAGNOSIS — H938X3 Other specified disorders of ear, bilateral: Secondary | ICD-10-CM | POA: Diagnosis not present

## 2024-06-15 NOTE — Procedures (Signed)
  Outpatient Audiology and [facility] [address], [place]  [address] [phone]  AUDIOLOGICAL  EVALUATION  NAME: Emily [name]     DOB:   05/27/1971      MRN: [id number]                                                                                     DATE: 06/15/2024     REFERENT: Rollene Almarie LABOR, MD STATUS: Outpatient DIAGNOSIS: tinnitus, aural fullness.    History: [name] was seen for an audiological evaluation due to concerns regarding her hearing sensitivity. [name] reports a history of decreased hearing sensitivity occurring for 1 year. She reports she is turning up the television louder and asking her friends and family for repetition. 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 [name] reports bilateral, intermittent aural fullness. [name] denies otalgia and dizziness.   Evaluation:  Otoscopy showed a clear view of the tympanic membranes, bilaterally Tympanometry results were consistent with normal middle ear function (Type A), bilaterally Audiometric testing was completed using Conventional Audiometry techniques with insert earphones and TDH headphones. Test results are consistent with normal hearing sensitivity at [phone] Hz in both ears. Speech Recognition Thresholds were obtained at 5 dB HL in the right ear and at 0  dB HL in the left ear. Word Recognition Testing was completed at 60 dB HL and Laketra scored 100%, bilaterally.    Results:  The test results were reviewed with [name]. Today's test results are consistent with normal hearing sensitivity at [phone] Hz in both ears. [name] is not expected to have hearing or communication difficulty at this time. [name] was given a copy of her audiogram today.  Recommendations: 1.   Monitor hearing sensitivity as needed   30 minutes spent testing and counseling on results.   If you have any questions please feel free to contact me at [phone]  [phone].  Darryle Posey Audiologist, Au.D., CCC-A 06/15/2024  4:08 PM  Cc: Rollene Almarie LABOR, MD

## 2024-06-17 ENCOUNTER — Other Ambulatory Visit: Payer: Self-pay | Admitting: Internal Medicine

## 2024-06-17 DIAGNOSIS — K5903 Drug induced constipation: Secondary | ICD-10-CM

## 2024-06-19 ENCOUNTER — Ambulatory Visit: Admitting: Internal Medicine

## 2024-06-22 DIAGNOSIS — G8929 Other chronic pain: Secondary | ICD-10-CM | POA: Diagnosis not present

## 2024-06-22 DIAGNOSIS — M542 Cervicalgia: Secondary | ICD-10-CM | POA: Diagnosis not present

## 2024-06-22 DIAGNOSIS — Z79891 Long term (current) use of opiate analgesic: Secondary | ICD-10-CM | POA: Diagnosis not present

## 2024-06-24 DIAGNOSIS — L089 Local infection of the skin and subcutaneous tissue, unspecified: Secondary | ICD-10-CM | POA: Diagnosis not present

## 2024-06-24 DIAGNOSIS — W57XXXA Bitten or stung by nonvenomous insect and other nonvenomous arthropods, initial encounter: Secondary | ICD-10-CM | POA: Diagnosis not present

## 2024-06-24 DIAGNOSIS — S80861A Insect bite (nonvenomous), right lower leg, initial encounter: Secondary | ICD-10-CM | POA: Diagnosis not present

## 2024-07-13 ENCOUNTER — Encounter: Payer: Self-pay | Admitting: Family Medicine

## 2024-07-13 ENCOUNTER — Ambulatory Visit: Payer: Self-pay

## 2024-07-13 ENCOUNTER — Ambulatory Visit: Admitting: Internal Medicine

## 2024-07-13 ENCOUNTER — Encounter: Payer: Self-pay | Admitting: Internal Medicine

## 2024-07-13 ENCOUNTER — Ambulatory Visit (INDEPENDENT_AMBULATORY_CARE_PROVIDER_SITE_OTHER): Admitting: Family Medicine

## 2024-07-13 VITALS — BP 138/80 | HR 96 | Temp 98.5°F | Ht 59.0 in | Wt 181.4 lb

## 2024-07-13 DIAGNOSIS — J301 Allergic rhinitis due to pollen: Secondary | ICD-10-CM | POA: Diagnosis not present

## 2024-07-13 DIAGNOSIS — J4520 Mild intermittent asthma, uncomplicated: Secondary | ICD-10-CM

## 2024-07-13 DIAGNOSIS — L03115 Cellulitis of right lower limb: Secondary | ICD-10-CM

## 2024-07-13 DIAGNOSIS — L299 Pruritus, unspecified: Secondary | ICD-10-CM

## 2024-07-13 DIAGNOSIS — F418 Other specified anxiety disorders: Secondary | ICD-10-CM

## 2024-07-13 DIAGNOSIS — I10 Essential (primary) hypertension: Secondary | ICD-10-CM

## 2024-07-13 DIAGNOSIS — Z79899 Other long term (current) drug therapy: Secondary | ICD-10-CM

## 2024-07-13 DIAGNOSIS — F331 Major depressive disorder, recurrent, moderate: Secondary | ICD-10-CM

## 2024-07-13 DIAGNOSIS — L02415 Cutaneous abscess of right lower limb: Secondary | ICD-10-CM

## 2024-07-13 MED ORDER — ESCITALOPRAM OXALATE 20 MG PO TABS
20.0000 mg | ORAL_TABLET | Freq: Every day | ORAL | 10 refills | Status: AC
Start: 2024-07-13 — End: ?

## 2024-07-13 MED ORDER — FLUCONAZOLE 150 MG PO TABS: 150.0000 mg | ORAL_TABLET | Freq: Every day | ORAL | 1 refills | Status: AC

## 2024-07-13 MED ORDER — BUSPIRONE HCL 7.5 MG PO TABS
7.5000 mg | ORAL_TABLET | Freq: Two times a day (BID) | ORAL | 3 refills | Status: DC
Start: 2024-07-13 — End: 2024-07-24

## 2024-07-13 MED ORDER — FLUTICASONE PROPIONATE 50 MCG/ACT NA SUSP: 2.0000 | Freq: Every day | NASAL | 2 refills | Status: AC

## 2024-07-13 MED ORDER — DOXYCYCLINE HYCLATE 100 MG PO CAPS
100.0000 mg | ORAL_CAPSULE | Freq: Two times a day (BID) | ORAL | 0 refills | Status: AC
Start: 2024-07-13 — End: 2024-07-20

## 2024-07-13 MED ORDER — ALBUTEROL SULFATE HFA 108 (90 BASE) MCG/ACT IN AERS: 2.0000 | INHALATION_SPRAY | RESPIRATORY_TRACT | 10 refills | Status: AC | PRN

## 2024-07-13 MED ORDER — DICYCLOMINE HCL 20 MG PO TABS: 20.0000 mg | ORAL_TABLET | Freq: Three times a day (TID) | ORAL | 10 refills | Status: AC

## 2024-07-13 MED ORDER — VENLAFAXINE HCL ER 37.5 MG PO CP24: 37.5000 mg | ORAL_CAPSULE | Freq: Every day | ORAL | 1 refills | Status: AC

## 2024-07-13 MED ORDER — LORATADINE 10 MG PO TABS: 10.0000 mg | ORAL_TABLET | Freq: Every day | ORAL | 3 refills | Status: AC

## 2024-07-13 MED ORDER — FLUTICASONE PROPIONATE HFA 110 MCG/ACT IN AERO: INHALATION_SPRAY | RESPIRATORY_TRACT | 11 refills | Status: AC

## 2024-07-13 MED ORDER — PANTOPRAZOLE SODIUM 40 MG PO TBEC: 40.0000 mg | DELAYED_RELEASE_TABLET | Freq: Every day | ORAL | 3 refills | Status: AC

## 2024-07-13 MED ORDER — MONTELUKAST SODIUM 10 MG PO TABS: ORAL_TABLET | ORAL | 3 refills | Status: AC

## 2024-07-13 MED ORDER — TRIAMCINOLONE ACETONIDE 0.1 % EX CREA: 1.0000 | TOPICAL_CREAM | Freq: Two times a day (BID) | CUTANEOUS | 0 refills | Status: AC

## 2024-07-13 MED ORDER — TRAZODONE HCL 50 MG PO TABS
50.0000 mg | ORAL_TABLET | Freq: Every day | ORAL | 10 refills | Status: AC
Start: 2024-07-13 — End: ?

## 2024-07-13 NOTE — Progress Notes (Signed)
 Acute Office Visit  Subjective:     [name], female    DOB: [birth year], 53 y.o.   MRN: [id number]  No chief complaint on file.   HPI  Discussed the use of AI scribe software for clinical note transcription with the patient, who gave verbal consent to proceed.  History of Present Illness Emily [name] is a 53 year old female who presents with a swollen and painful lesion on her leg.  Right lower extremity cutaneous lesion - Swollen and painful lesion on the leg, initially resembling a small pimple or mosquito bite - Lesion has increased in size, is hard, with surrounding swelling and erythema - Initial pain radiated from the knee down and was sensitive to touch - Currently healing with peeling skin and occasional pruritus, which is relieved by rubbing the area - No associated fever or significant systemic symptoms - Lesion developed after an insect bite while wearing leggings around 06/19/24 - Was seen at urgent care and treated with doxycycline  which improved symptoms on 06/24/24 - History of similar pustular reactions to ant bites in the past - Currently taking doxycycline  100 mg without gastrointestinal side effects  Respiratory symptoms and allergic rhinitis - Wheezing and allergy symptoms, worsened with cooler weather - Increased frequency of wheezing and allergy symptoms over the past week - Uses Singulair , Flonase , and an inhaler for symptom management - Requests refills for Singulair , Flonase , and inhaler     ROS Per HPI      Objective:    BP 138/80 (BP Location: Left Arm, Patient Position: Sitting)   Pulse 96   Temp 98.5 F (36.9 C) (Temporal)   Ht 4' 11 (1.499 m)   Wt 181 lb 6.4 oz (82.3 kg)   LMP 07/20/2017   SpO2 93%   BMI 36.64 kg/m    Physical Exam Vitals and nursing note reviewed.  Constitutional:      General: She is not in acute distress.    Appearance: Normal appearance.  HENT:     Head: Normocephalic  and atraumatic.     Right Ear: External ear normal.     Left Ear: External ear normal.     Nose: Nose normal.     Mouth/Throat:     Mouth: Mucous membranes are moist.     Pharynx: Oropharynx is clear.  Eyes:     Extraocular Movements: Extraocular movements intact.     Pupils: Pupils are equal, round, and reactive to light.  Cardiovascular:     Rate and Rhythm: Normal rate and regular rhythm.     Pulses: Normal pulses.     Heart sounds: Normal heart sounds.  Pulmonary:     Effort: Pulmonary effort is normal. No respiratory distress.     Breath sounds: Wheezing present. No rhonchi or rales.  Musculoskeletal:        General: Normal range of motion.     Cervical back: Normal range of motion.     Right lower leg: No edema.     Left lower leg: No edema.  Lymphadenopathy:     Cervical: No cervical adenopathy.  Skin:    Comments: See photo  Neurological:     General: No focal deficit present.     Mental Status: She is alert and oriented to person, place, and time.  Psychiatric:        Mood and Affect: Mood normal.        Thought Content: Thought content normal.     No  results found for any visits on 07/13/24.      Assessment & Plan:   Assessment and Plan Assessment & Plan Infected insect bite on leg (cellulitis of limb) Infected insect bite improving but with persistent swelling and redness. Likely cellulitis, treated with doxycycline . - Prescribe doxycycline  100 mg. - Advise monitoring for worsening symptoms such as increased swelling, fever, or systemic symptoms, and return in a week if not improved. - Prescribe triamcinolone  cream for itching. - Advise against removing scab prematurely.  Allergic rhinitis Symptoms worsened due to weather changes. Current management with Singulair , Flonase , and inhaler. - Refill Singulair . - Refill Flonase . - Refill inhaler.  MDD, recurrent, moderate/GAD - Refilled buspar , effexor , lexapro  and trazodone       No orders of the  defined types were placed in this encounter.    Meds ordered this encounter  Medications   doxycycline  (VIBRAMYCIN ) 100 MG capsule    Sig: Take 1 capsule (100 mg total) by mouth 2 (two) times daily for 7 days.    Dispense:  14 capsule    Refill:  0   fluconazole  (DIFLUCAN ) 150 MG tablet    Sig: Take 1 tablet (150 mg total) by mouth daily. Take one tablet at the end of your antibiotic, take another tablet 3 days later    Dispense:  2 tablet    Refill:  1   triamcinolone  cream (KENALOG ) 0.1 %    Sig: Apply 1 Application topically 2 (two) times daily.    Dispense:  30 g    Refill:  0   albuterol  (VENTOLIN  HFA) 108 (90 Base) MCG/ACT inhaler    Sig: Inhale 2 puffs into the lungs every 4 (four) hours as needed for wheezing or shortness of breath.    Dispense:  8.5 g    Refill:  10   busPIRone  (BUSPAR ) 7.5 MG tablet    Sig: Take 1 tablet (7.5 mg total) by mouth 2 (two) times daily.    Dispense:  180 tablet    Refill:  3   loratadine  (CLARITIN ) 10 MG tablet    Sig: Take 1 tablet (10 mg total) by mouth daily.    Dispense:  90 tablet    Refill:  3   montelukast  (SINGULAIR ) 10 MG tablet    Sig: TAKE ONE TABLET BY MOUTH EVERYDAY AT BEDTIME Annual appt due in July, must see provider for further refills    Dispense:  90 tablet    Refill:  3   pantoprazole  (PROTONIX ) 40 MG tablet    Sig: Take 1 tablet (40 mg total) by mouth daily.    Dispense:  90 tablet    Refill:  3    The patient has updated the prescriber from the original prescriber   fluticasone  (FLONASE ) 50 MCG/ACT nasal spray    Sig: Place 2 sprays into both nostrils daily.    Dispense:  48 g    Refill:  2    This prescription was filled on 05/08/2023. Any refills authorized will be placed on file.   fluticasone  (FLOVENT  HFA) 110 MCG/ACT inhaler    Sig: INHALE TWO PUFFS BY MOUTH INTO LUNGS BY MOUTH TWICE DAILY    Dispense:  36 g    Refill:  11    This prescription was filled on 12/07/2022. Any refills authorized will be placed  on file.   dicyclomine  (BENTYL ) 20 MG tablet    Sig: Take 1 tablet (20 mg total) by mouth 4 (four) times daily -  before meals and  at bedtime.    Dispense:  120 tablet    Refill:  10   escitalopram  (LEXAPRO ) 20 MG tablet    Sig: Take 1 tablet (20 mg total) by mouth daily.    Dispense:  30 tablet    Refill:  10   traZODone  (DESYREL ) 50 MG tablet    Sig: Take 1 tablet (50 mg total) by mouth at bedtime.    Dispense:  30 tablet    Refill:  10   venlafaxine  XR (EFFEXOR -XR) 37.5 MG 24 hr capsule    Sig: Take 1 capsule (37.5 mg total) by mouth daily with breakfast.    Dispense:  100 capsule    Refill:  1    Return in about 1 week (around 07/20/2024) for recheck leg.  Corean LITTIE Ku, FNP

## 2024-07-13 NOTE — Telephone Encounter (Signed)
 FYI Only or Action Required?: FYI only for provider.  Patient was last seen in primary care on 10/14/2023 by Merlynn Niki FALCON, FNP.  Called Nurse Triage reporting Insect Bite.  Symptoms began about a month ago.  Interventions attempted: Prescription medications: doxycyline; home remedy: apple cider vinegar.  Symptoms are: intermittent migraines (occurring about twice monthly x 6 months with previous history of migraines; right lower leg/calf leg wound with scab/pink/flaky skin and tenderness stable.  Triage Disposition: See Physician Within 24 Hours  Patient/caregiver understands and will follow disposition?: Yes               Copied from CRM #[id number]. Topic: Clinical - Red Word Triage >> Jul 13, 2024  2:12 PM Drema MATSU wrote: [facility] that prompted transfer to Nurse Triage: Patient has a spider bite that is not healing. She said that it is spreading and has gotten bigger. (the size of a dime and arounf that is about the size of a 5o cent piece) It is red, swollen, and tender. Reason for Disposition  [1] Red or very tender (to touch) area AND [2] started over 24 hours after the bite  Answer Assessment - Initial Assessment Questions Patient states she was seen at urgent care and told it looked like a spider bite but they are unsure, she was placed on doxycyline. She states she finished the antibiotic about a week ago. She states before she went to urgent care she was doing a home remedy of putting apple cider vinegar on the bite. Patient uploaded photo to mychart and advised she come in sooner than Friday with PCP. Patient agreeable to be seen today by available provider.   1. TYPE of INSECT: What type of insect was it?      She does not know but she assumed it is a spider bite.  2. ONSET: When did you get bitten?      About a month ago.  3. LOCATION: Where is the insect bite located?      Right leg, lower calf toward the side/back.  4. REDNESS: Is the area red  or pink? If Yes, ask: What size is the area of redness? (inches or cm). When did the redness start?     She states there is a scab that is looking like it is falling off and she states the area around it is peeling. She states there is redness around the bite/wound about the size of 50 cent. She states it was decreasing on the antibiotic but now is at a standstill/stable.  5. PAIN: Is there any pain? If Yes, ask: How bad is the pain? (Scale 0-10; or none, mild, moderate, severe)     Yes, tenderness (states it has improved but is at a standstill) 2-3/10. Worsens she states if she accidentally hits it against anything.  6. ITCHING: Does it itch? If Yes, ask: How bad is the itch?      Yes. Moderate.  7. SWELLING: How big is the swelling? (e.g., inches, cm, or compare to coins)     1-2 inches of swelling.  8. OTHER SYMPTOMS: Do you have any other symptoms?  (e.g., difficulty breathing, fever, hives)     Denies any pus head/drainage, fever, warmth, vomiting, SOB. She states she had felt lightheaded but also had her ex husband pass away recently so she has been through a roller coaster of emotions. Intermittent migraines (about twice a month, happening more frequently) x 6 months.  9. PREGNANCY: Is there any chance  you are pregnant? When was your last menstrual period?     N/A.  Protocols used: Insect Bite-A-AH

## 2024-07-13 NOTE — Patient Instructions (Addendum)
 Doxycycline  100mg  one capsule twice a day for 7 days.   I have also sent in Diflucan  for you to take in case of yeast after antibiotic treatment.  If you are having symptoms when you complete antibiotics, may take 1 tablet.  If you are still having symptoms 3 days later, may take the second tablet.  Follow-up with me for new or worsening symptoms.  Follow up in a week with me if your leg is not healing well.

## 2024-07-17 ENCOUNTER — Ambulatory Visit: Admitting: Internal Medicine

## 2024-07-17 NOTE — Telephone Encounter (Signed)
 Spoke to patient, patient stated she was seen same day and she has an appointment 07/24/2024 for this issues. States scab is gone.   Curtistine Quiet, CMA

## 2024-07-21 ENCOUNTER — Ambulatory Visit: Admitting: Internal Medicine

## 2024-07-21 ENCOUNTER — Other Ambulatory Visit: Payer: Self-pay | Admitting: Internal Medicine

## 2024-07-21 DIAGNOSIS — K219 Gastro-esophageal reflux disease without esophagitis: Secondary | ICD-10-CM

## 2024-07-21 DIAGNOSIS — G8929 Other chronic pain: Secondary | ICD-10-CM | POA: Diagnosis not present

## 2024-07-21 DIAGNOSIS — Z79891 Long term (current) use of opiate analgesic: Secondary | ICD-10-CM | POA: Diagnosis not present

## 2024-07-21 DIAGNOSIS — M542 Cervicalgia: Secondary | ICD-10-CM | POA: Diagnosis not present

## 2024-07-24 ENCOUNTER — Encounter: Payer: Self-pay | Admitting: Internal Medicine

## 2024-07-24 ENCOUNTER — Ambulatory Visit: Admitting: Internal Medicine

## 2024-07-24 VITALS — BP 124/84 | HR 80 | Temp 98.1°F | Ht 59.0 in | Wt 183.0 lb

## 2024-07-24 DIAGNOSIS — G43809 Other migraine, not intractable, without status migrainosus: Secondary | ICD-10-CM

## 2024-07-24 DIAGNOSIS — Z23 Encounter for immunization: Secondary | ICD-10-CM

## 2024-07-24 DIAGNOSIS — F418 Other specified anxiety disorders: Secondary | ICD-10-CM

## 2024-07-24 DIAGNOSIS — M79604 Pain in right leg: Secondary | ICD-10-CM

## 2024-07-24 DIAGNOSIS — R739 Hyperglycemia, unspecified: Secondary | ICD-10-CM | POA: Diagnosis not present

## 2024-07-24 DIAGNOSIS — I10 Essential (primary) hypertension: Secondary | ICD-10-CM

## 2024-07-24 DIAGNOSIS — F331 Major depressive disorder, recurrent, moderate: Secondary | ICD-10-CM

## 2024-07-24 LAB — COMPREHENSIVE METABOLIC PANEL WITH GFR
ALT: 16 U/L (ref 0–35)
AST: 21 U/L (ref 0–37)
Albumin: 4.4 g/dL (ref 3.5–5.2)
Alkaline Phosphatase: 115 U/L (ref 39–117)
BUN: 8 mg/dL (ref 6–23)
CO2: 27 meq/L (ref 19–32)
Calcium: 9.1 mg/dL (ref 8.4–10.5)
Chloride: 104 meq/L (ref 96–112)
Creatinine, Ser: 0.81 mg/dL (ref 0.40–1.20)
GFR: 82.99 mL/min (ref >60.00)
Glucose, Bld: 126 mg/dL — ABNORMAL HIGH (ref 70–99)
Potassium: 3.8 meq/L (ref 3.5–5.1)
Sodium: 140 meq/L (ref 135–145)
Total Bilirubin: 0.8 mg/dL (ref 0.2–1.2)
Total Protein: 7.7 g/dL (ref 6.0–8.3)

## 2024-07-24 LAB — CBC
HCT: 40 % (ref 36.0–46.0)
Hemoglobin: 13.2 g/dL (ref 12.0–15.0)
MCHC: 33.1 g/dL (ref 30.0–36.0)
MCV: 86.4 fl (ref 78.0–100.0)
Platelets: 223 K/uL (ref 150.0–400.0)
RBC: 4.63 Mil/uL (ref 3.87–5.11)
RDW: 12.8 % (ref 11.5–15.5)
WBC: 7.9 K/uL (ref 4.0–10.5)

## 2024-07-24 LAB — LIPID PANEL
Cholesterol: 153 mg/dL (ref 0–200)
HDL: 24.9 mg/dL — ABNORMAL LOW (ref >39.00)
LDL Cholesterol: 97 mg/dL (ref 0–99)
NonHDL: 128.33
Total CHOL/HDL Ratio: 6
Triglycerides: 158 mg/dL — ABNORMAL HIGH (ref 0.0–149.0)
VLDL: 31.6 mg/dL (ref 0.0–40.0)

## 2024-07-24 LAB — HEMOGLOBIN A1C: Hgb A1c MFr Bld: 6 % (ref 4.6–6.5)

## 2024-07-24 MED ORDER — BUSPIRONE HCL 10 MG PO TABS: 10.0000 mg | ORAL_TABLET | Freq: Two times a day (BID) | ORAL | 1 refills | Status: AC

## 2024-07-24 NOTE — Assessment & Plan Note (Signed)
 Migraines have recurred, becoming less frequent but more intense, associated with tension and stress. Consider referral to physical therapy for neck and shoulder evaluation. Monitor migraine frequency and intensity. Suspect triggered by taking care of 23.50 month old grandson primarily due to family dynamics.

## 2024-07-24 NOTE — Assessment & Plan Note (Signed)
 BP at goal on regimen checking CMP and adjust as needed.

## 2024-07-24 NOTE — Assessment & Plan Note (Signed)
 The infection, likely from an insect bite, was initially treated with antibiotics and topical cream. It shows improvement but is not fully resolved. The area remains tender and itchy with a subcutaneous nodule. Continue topical cream. She is using apple cider vinegar for pain relief which is okay to continue and a Band-Aid for protection if needed. No further antibiotics are indicated. Wound to likely close within next month. Watch for growing size, redness, worsening pain, swelling.

## 2024-07-24 NOTE — Assessment & Plan Note (Signed)
 This is worsening and moderate symptoms. She will continue lexapro  20 mg daily, refer to counseling. Increase buspar  to 10 mg BID.

## 2024-07-24 NOTE — Progress Notes (Signed)
 Subjective:   [name], female    DOB: Feb 13, 1971, 53 y.o.   MRN: [id number]  Discussed the use of AI scribe software for clinical note transcription with the patient, who gave verbal consent to proceed.  History of Present Illness Emily [name] is a 53 year old female who presents with a leg infection following an insect bite.  The leg infection began with a small pimple-like spot after an insect bite, which progressively worsened, becoming red, swollen, and forming a large scab with surrounding erythema. The pain is severe, throbbing, and aching, disrupting sleep and requiring oxycodone  for relief. Despite two rounds of antibiotics from urgent care, the pain persisted, and she tried apple cider vinegar as a home remedy, which helped relieve the pain. The infection has been present since late July, with the scab reducing in size but the area remaining tender and feeling like a hard knot. No fever or systemic symptoms were noted, but she experienced initial lightheadedness. She has a history of back issues, including collapsed discs, and initially thought the leg pain might be related to her back pain, which radiates down her leg.  She reports increased wheezing since the fall, attributing it to seasonal changes and ragweed exposure, along with nasal congestion. She can predict seasonal changes by these symptoms.  She has a history of depression and anxiety, noting increased anxiety, 'jitters', and memory difficulties, which she attributes to stress and sleep deprivation. She is considering re-engaging with counseling services.  She has a history of migraines, which have become more intense recently, though less frequent than before her neck surgery. The migraines are characterized by photophobia, phonophobia, nausea, and pain that starts in the neck and moves to the head.  She is dealing with significant personal stress, including the recent death of her  ex-husband and ongoing family crises, impacting her ability to seek timely medical care.   Review of Systems  Constitutional: Negative.   HENT:  Positive for congestion and postnasal drip.   Eyes: Negative.   Respiratory:  Negative for cough, chest tightness and shortness of breath.   Cardiovascular:  Negative for chest pain, palpitations and leg swelling.  Gastrointestinal:  Negative for abdominal distention, abdominal pain, constipation, diarrhea, nausea and vomiting.  Musculoskeletal: Negative.   Skin:  Positive for wound.  Neurological:  Positive for headaches.  Psychiatric/Behavioral:  Positive for decreased concentration and dysphoric mood. The patient is nervous/anxious.     Objective:  Physical Exam Constitutional:      Appearance: She is well-developed.  HENT:     Head: Normocephalic and atraumatic.  Cardiovascular:     Rate and Rhythm: Normal rate and regular rhythm.  Pulmonary:     Effort: Pulmonary effort is normal. No respiratory distress.     Breath sounds: Normal breath sounds. No wheezing or rales.  Abdominal:     General: Bowel sounds are normal. There is no distension.     Palpations: Abdomen is soft.     Tenderness: There is no abdominal tenderness.  Musculoskeletal:        General: Tenderness present.     Cervical back: Normal range of motion.  Skin:    General: Skin is warm and dry.     Comments: Wound improved from prior with smaller scab and no surrounding erythema or swelling, minimal tenderness to palpation  Neurological:     Mental Status: She is alert and oriented to person, place, and time.     Coordination: Coordination normal.  Vitals:   07/24/24 0918  BP: 124/84  Pulse: 80  Temp: 98.1 F (36.7 C)  TempSrc: Oral  SpO2: 96%  Weight: 183 lb (83 kg)  Height: 4' 11 (1.499 m)   Flu shot given at visit  Assessment and Plan Assessment & Plan Skin and soft tissue infection of lower limb   The infection, likely from an insect bite,  was initially treated with antibiotics and topical cream. It shows improvement but is not fully resolved. The area remains tender and itchy with a subcutaneous nodule. Continue topical cream. She is using apple cider vinegar for pain relief which is okay to continue and a Band-Aid for protection if needed. No further antibiotics are indicated.  Major depressive disorder and anxiety disorder   Increased anxiety and depression are due to recent stressors, with sleep disturbances and jitters noted. She is interested in resuming counseling and adjusting medication. Refer to counseling services and increase buspar  to 10 mg BID. Keep lexapro  20 mg daily. Discuss potential use of melatonin for sleep disturbances. Suspect life stressors have caused worsening  Migraine   Migraines have recurred, becoming less frequent but more intense, associated with tension and stress. Consider referral to physical therapy for neck and shoulder evaluation. Monitor migraine frequency and intensity.

## 2024-07-24 NOTE — Patient Instructions (Addendum)
 We will do the labs today.  We will increase the buspar  to 10 mg twice a day and have sent in a new prescription.  We will get you back in with counseling.

## 2024-07-24 NOTE — Assessment & Plan Note (Signed)
 Checking HGA1c for follow up.

## 2024-07-24 NOTE — Assessment & Plan Note (Signed)
 BMI 36.9 and complicated by hypertension. Counseled about diet and exercise.

## 2024-07-29 ENCOUNTER — Ambulatory Visit: Payer: Self-pay | Admitting: Internal Medicine

## 2024-07-30 DIAGNOSIS — C50911 Malignant neoplasm of unspecified site of right female breast: Secondary | ICD-10-CM | POA: Diagnosis not present

## 2024-07-30 DIAGNOSIS — Z1231 Encounter for screening mammogram for malignant neoplasm of breast: Secondary | ICD-10-CM | POA: Diagnosis not present

## 2024-07-30 DIAGNOSIS — D509 Iron deficiency anemia, unspecified: Secondary | ICD-10-CM | POA: Diagnosis not present

## 2024-07-30 DIAGNOSIS — N6091 Unspecified benign mammary dysplasia of right breast: Secondary | ICD-10-CM | POA: Diagnosis not present

## 2024-07-30 DIAGNOSIS — I10 Essential (primary) hypertension: Secondary | ICD-10-CM | POA: Diagnosis not present

## 2024-07-30 DIAGNOSIS — R748 Abnormal levels of other serum enzymes: Secondary | ICD-10-CM | POA: Diagnosis not present

## 2024-08-03 ENCOUNTER — Encounter: Admitting: Internal Medicine

## 2024-08-06 ENCOUNTER — Encounter: Payer: Self-pay | Admitting: Podiatry

## 2024-08-06 ENCOUNTER — Ambulatory Visit (INDEPENDENT_AMBULATORY_CARE_PROVIDER_SITE_OTHER): Admitting: Podiatry

## 2024-08-06 DIAGNOSIS — L600 Ingrowing nail: Secondary | ICD-10-CM | POA: Diagnosis not present

## 2024-08-06 MED ORDER — NEOMYCIN-POLYMYXIN-HC 3.5-10000-1 OT SUSP: OTIC | 0 refills | Status: AC

## 2024-08-06 NOTE — Patient Instructions (Signed)

## 2024-08-06 NOTE — Progress Notes (Signed)
 Subjective:  [name], female    DOB: Oct 28, 1971,  MRN: [id number]  Emily [name] presents to clinic today for:  Chief Complaint  Patient presents with   Ingrown Toenail    Patient is here for left hallux medial border removal   Patient comes in concerned with ingrown toenail left first toe medial border.  He states that has been ongoing for months.  She denies signs of infection.  It does hurt her with shoe gear.  She denies diabetes.  She does smoking between a half a pack to 1 pack a day of cigarettes a day.  PCP is Rollene Almarie LABOR, MD.  Allergies  Allergen Reactions   Other Hives    Dermabond   Wound Dressing Adhesive Rash   Morphine  Other (See Comments)    Drops BP    Morphine  And Codeine      Lowers BP too much   Tape Dermatitis    Review of Systems: Negative except as noted in the HPI.  Objective:  There were no vitals filed for this visit.  Emily [name] is a pleasant 53 y.o. female in NAD. AAO x 3.  Vascular Examination: Capillary refill time is less than 3 seconds to toes bilateral. Palpable pedal pulses b/l LE. Digital hair present b/l. No pedal edema b/l. Skin temperature gradient WNL b/l. No varicosities b/l. No cyanosis or clubbing noted b/l.   Dermatological Examination: There is incurvation of the left hallux medial nail border.  There is pain on palpation of the affected nail border.  No significant erythema or edema present.  No drainage.  No signs of acute bacterial infection.  Neurological Examination: Protective sensation intact with Semmes-Weinstein 10 gram monofilament b/l LE. Vibratory sensation intact b/l LE.  Musculoskeletal examination: Muscle strength 5/5 all major muscle groups.  No symptomatic limitations in pedal range of motion.  No symptomatic pedal deformities.     Latest Ref Rng & Units 07/24/2024   10:26 AM  Hemoglobin A1C  Hemoglobin-A1c 4.6 - 6.5 % 6.0       Assessment/Plan: 1. Ingrown toenail of left foot     Meds ordered this encounter  Medications   neomycin -polymyxin-hydrocortisone  (CORTISPORIN) 3.5-10000-1 OTIC suspension    Sig: Apply 1-2 drops daily after soaking and cover with bandaid    Dispense:  10 mL    Refill:  0  # New patient visit for ingrown toenail left first toe medial border - Risk, benefits alternative therapies were discussed with patient - Did recommend partial nail avulsion of left hallux medial border ingrown toenail with chemical matrixectomy - Patient agreeable, written consent obtained - Aftercare instructions discussed with patient - Cortisporin drops sent for aftercare of the ingrown toenail site - Does endorse long-term tobacco use.  Discussed importance of smoking cessation to prevent wound healing complications.  Pulses were found to be adequate today - Recommend over-the-counter Tylenol  325 1 to 2 tablets and ibuprofen  400 mg 1 to 2 tablets every 6 hours as needed for pain  Discussed patient's condition today.  After obtaining patient consent, the left first toe was anesthetized with a 50:50 mixture of 1% lidocaine  plain and 0.5% bupivacaine  plain for a total of 3cc's administered.  Toe was prepped with Betadine.  Upon confirmation of anesthesia, a freer elevator was utilized to free the left hallux medial nail border from the nail bed.  The nail border was then avulsed proximal to the eponychium and removed in toto.  The area was inspected for any remaining spicules.  A chemical matrixectomy was performed with phenol and neutralized with isopropyl alcohol solution.  Antibiotic ointment and a DSD were applied, followed by a Coban dressing.  Patient tolerated the anesthetic and procedure well and will f/u in 2-3 weeks for recheck.  Patient given post-procedure instructions for daily 20-minute Epsom salt soaks, antibiotic drops and daily use of Bandaids until toe starts to dry / form eschar.    Return in about  2 weeks (around 08/20/2024) for Nail Check.   Ethan Saddler, DPM, AACFAS [facility]     [address] [place], [place] [address]                Office [phone]  Fax [phone]

## 2024-08-07 ENCOUNTER — Telehealth: Payer: Self-pay

## 2024-08-07 NOTE — Telephone Encounter (Signed)
 Patient called for advice on pain and burning sensation after ingrown toe nail procedure. Patient encouraged to continue following after care instructions, take pain medication when needed. Told patient to look for signs of an infection and if symptoms get worse to seek medical attention at nearest urgent care.

## 2024-08-11 ENCOUNTER — Encounter: Admitting: Internal Medicine

## 2024-08-19 DIAGNOSIS — N8111 Cystocele, midline: Secondary | ICD-10-CM | POA: Diagnosis not present

## 2024-08-20 ENCOUNTER — Ambulatory Visit: Admitting: Podiatry

## 2024-08-21 ENCOUNTER — Ambulatory Visit: Admitting: Internal Medicine

## 2024-08-21 ENCOUNTER — Encounter: Payer: Self-pay | Admitting: Internal Medicine

## 2024-08-21 VITALS — BP 126/80 | HR 87 | Temp 98.9°F | Ht 59.0 in | Wt 179.0 lb

## 2024-08-21 DIAGNOSIS — J410 Simple chronic bronchitis: Secondary | ICD-10-CM

## 2024-08-21 DIAGNOSIS — Z0001 Encounter for general adult medical examination with abnormal findings: Secondary | ICD-10-CM

## 2024-08-21 DIAGNOSIS — R7303 Prediabetes: Secondary | ICD-10-CM | POA: Diagnosis not present

## 2024-08-21 NOTE — Assessment & Plan Note (Signed)
 Flu shot complete for season. Pneumonia up to date. Shingrix due at pharmacy. Tetanus up to date. Colonoscopy up to date. Mammogram up to date, pap smear up to date. Counseled about sun safety and mole surveillance. Counseled about the dangers of distracted driving. Given 10 year screening recommendations.

## 2024-08-21 NOTE — Assessment & Plan Note (Signed)
 Counseled to quit and she is unable to make attempt at this time.

## 2024-08-21 NOTE — Progress Notes (Signed)
   Subjective:   [name], female    DOB: 18-Nov-1971, 53 y.o.   MRN: [id number]  The patient is here for physical. Pertinent topics discussed: Discussed the use of AI scribe software for clinical note transcription with the patient, who gave verbal consent to proceed.  History of Present Illness Emily [name] is a 53 year old female who presents with ongoing leg pain and recent allergy symptoms.  She continues to experience leg pain, although the skin appears to be healing. She reports that she can live with the pain as long as it is not sore. She recently visited a foot doctor for an ingrown toenail, which resulted in a breakout likely due to the bandage adhesive. She is managing this with moisturizing cream.  She is experiencing increased allergy symptoms, leading to more frequent use of her inhaler and breathing treatments. No new skin spots, moles, lumps, or bumps. No new chronic stomach issues, diarrhea, or constipation.  She occasionally experiences a burning sensation in her stomach, which she attributes to GERD and manages with stomach medication.  She reports some headaches but does not provide further details.  She has a history of prediabetes with a hemoglobin A1c of 6.0. She mentions difficulty eating breakfast and typically eats once a day.  She is raising her grandchild and dealing with the emotional and logistical challenges following the death of her ex-husband, which has impacted her routine and emotional well-being. She finds comfort in her grandchild, who has helped with her depression.  PMH, [facility], social history reviewed and updated  Review of Systems  Constitutional: Negative.   HENT: Negative.    Eyes: Negative.   Respiratory:  Negative for cough, chest tightness and shortness of breath.   Cardiovascular:  Negative for chest pain, palpitations and leg swelling.  Gastrointestinal:  Negative for abdominal distention, abdominal pain,  constipation, diarrhea, nausea and vomiting.  Musculoskeletal: Negative.   Skin: Negative.   Neurological: Negative.   Psychiatric/Behavioral: Negative.      Objective:  Physical Exam Constitutional:      Appearance: She is well-developed.  HENT:     Head: Normocephalic and atraumatic.  Cardiovascular:     Rate and Rhythm: Normal rate and regular rhythm.  Pulmonary:     Effort: Pulmonary effort is normal. No respiratory distress.     Breath sounds: Normal breath sounds. No wheezing or rales.  Abdominal:     General: Bowel sounds are normal. There is no distension.     Palpations: Abdomen is soft.     Tenderness: There is no abdominal tenderness.  Musculoskeletal:     Cervical back: Normal range of motion.  Skin:    General: Skin is warm and dry.  Neurological:     Mental Status: She is alert and oriented to person, place, and time.     Coordination: Coordination normal.     Vitals:   08/21/24 1122  BP: 126/80  Pulse: 87  Temp: 98.9 F (37.2 C)  TempSrc: Oral  SpO2: 97%  Weight: 179 lb (81.2 kg)  Height: 4' 11 (1.499 m)    Assessment & Plan:

## 2024-08-21 NOTE — Patient Instructions (Signed)
 We will get  you in with the nutritionist.

## 2024-08-21 NOTE — Assessment & Plan Note (Signed)
 Referral to nutrition and discussed this with her during visit including diet and lifestyle changes.

## 2024-08-24 DIAGNOSIS — Z79891 Long term (current) use of opiate analgesic: Secondary | ICD-10-CM | POA: Diagnosis not present

## 2024-08-24 DIAGNOSIS — M542 Cervicalgia: Secondary | ICD-10-CM | POA: Diagnosis not present

## 2024-08-24 DIAGNOSIS — G8929 Other chronic pain: Secondary | ICD-10-CM | POA: Diagnosis not present

## 2024-08-26 DIAGNOSIS — M5412 Radiculopathy, cervical region: Secondary | ICD-10-CM | POA: Diagnosis not present

## 2024-08-27 ENCOUNTER — Ambulatory Visit: Admitting: Physical Therapy

## 2024-09-15 ENCOUNTER — Ambulatory Visit: Admitting: Podiatry

## 2024-09-15 ENCOUNTER — Other Ambulatory Visit: Payer: Self-pay | Admitting: Internal Medicine

## 2024-09-15 DIAGNOSIS — K5903 Drug induced constipation: Secondary | ICD-10-CM

## 2024-09-15 DIAGNOSIS — J4520 Mild intermittent asthma, uncomplicated: Secondary | ICD-10-CM

## 2024-09-15 DIAGNOSIS — M5416 Radiculopathy, lumbar region: Secondary | ICD-10-CM | POA: Diagnosis not present

## 2024-09-25 ENCOUNTER — Other Ambulatory Visit: Payer: Self-pay | Admitting: Internal Medicine

## 2024-09-25 DIAGNOSIS — K5903 Drug induced constipation: Secondary | ICD-10-CM

## 2024-09-29 ENCOUNTER — Ambulatory Visit: Admitting: Podiatry

## 2024-10-18 NOTE — Therapy (Incomplete)
 OUTPATIENT PHYSICAL THERAPY FEMALE PELVIC EVALUATION   Patient Name: Emily [name] MRN: [id number] DOB:06-06-1971, 53 y.o., female Today's Date: 10/18/2024  END OF SESSION:   Past Medical History:  Diagnosis Date   Allergic rhinitis    Allergy    Anal fissure    Anemia    Anxiety    Arthritis    Asthma    uses inhaler as needed   Breast cancer (HCC)    Chronic neck pain    pain mgmt in [place]   Depression    Gallbladder disease    GERD (gastroesophageal reflux disease)    Headache(784.0)    HTN (hypertension)    BP has been normal recently per pt and not taking meds   Hyperthyroidism    Obesity    PONV (postoperative nausea and vomiting)    Thyroid  nodule    incidental on MRI neck 2014, s/p endo eval   Uterine fibroid    Past Surgical History:  Procedure Laterality Date   BREAST LUMPECTOMY Right 2021   BREAST LUMPECTOMY WITH RADIOACTIVE SEED LOCALIZATION Right 09/23/2019   Procedure: RIGHT BREAST LUMPECTOMY WITH RADIOACTIVE SEED LOCALIZATION;  Surgeon: Vanderbilt Ned, MD;  Location: [facility];  Service: General;  Laterality: Right;   BREAST REDUCTION SURGERY Right    with partial mastectomy   CERVICAL SPINE SURGERY  2015   2 disc removed   Chirai Malformation  2003   CHOLECYSTECTOMY  1993   DILITATION & CURRETTAGE/HYSTROSCOPY WITH NOVASURE ABLATION N/A 03/15/2014   Procedure: DILATATION & CURETTAGE/HYSTEROSCOPY WITH NOVASURE ABLATION;  Surgeon: Charlie JINNY Flowers, MD;  Location: [facility];  Service: Gynecology;  Laterality: N/A;   Laberal tear  2001   Right Shoulder   LAPAROSCOPIC TOTAL HYSTERECTOMY  02/03/2018   Partial kept ovaries   LUMBAR DISC SURGERY     L4-L5   MOUTH SURGERY     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)   WISDOM TOOTH EXTRACTION  1992   Patient Active Problem List   Diagnosis Date Noted   Right leg pain 07/24/2024   Other microscopic hematuria 12/24/2022   Morbid obesity (HCC) 06/18/2022   Pre-diabetes  07/18/2020   Smokers' cough (HCC) 09/30/2019   Constipation due to opioid therapy 09/30/2019   At high risk for breast cancer 09/24/2019   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 09/24/2019   Iron deficiency anemia 12/16/2015   Hemorrhoids 06/14/2015   Trigger index finger of right hand 05/17/2015   Encounter for general adult medical examination with abnormal findings 05/12/2015   Bilateral carpal tunnel syndrome 03/29/2015   Left thyroid  nodule    Back pain, lumbosacral    Migraine headache 03/22/2010   Essential hypertension 03/22/2010   Allergic rhinitis 03/22/2010   Asthma 03/22/2010   GERD 03/22/2010   Arthropathy of shoulder region 03/22/2010   MDD (major depressive disorder) 03/22/2010    PCP: Rollene Almarie LABOR, MD  REFERRING PROVIDER: Darol Almarie, MD  REFERRING DIAG: N39.3 (ICD-10-CM) - Female stress incontinence N81.11 (ICD-10-CM) - Cystocele, midline   THERAPY DIAG:  No diagnosis found.  Rationale for Evaluation and Treatment: Rehabilitation  ONSET DATE: ***  SUBJECTIVE:  SUBJECTIVE STATEMENT: *** Fluid intake:   FUNCTIONAL LIMITATIONS: ***  PERTINENT HISTORY:  Medications for current condition: *** Surgeries: *** Other: *** Sexual abuse: {Yes/No:304960894}  PAIN:  Are you having pain? {yes/no:20286} NPRS scale: ***/10 Pain location: {pelvic pain location:27098}  Pain type: {type:313116} Pain description: {PAIN DESCRIPTION:21022940}   Aggravating factors: *** Relieving factors: ***  PRECAUTIONS: {Therapy precautions:24002}  RED FLAGS: {PT Red Flags:29287}   WEIGHT BEARING RESTRICTIONS: {Yes ***/No:24003}  FALLS:  Has patient fallen in last 6 months? {fallsyesno:27318}  OCCUPATION: ***  ACTIVITY LEVEL : ***  PLOF:  {PLOF:24004}  PATIENT GOALS: ***   BOWEL MOVEMENT: Pain with bowel movement: {yes/no:20286} Type of bowel movement:{PT BM type:27100} Fully empty rectum: {No/Yes:304960894} Leakage: {Yes/No:304960894}                                                  Caused by: *** Bowel urgency: *** Pads: {Yes/No:304960894} Fiber supplement/laxative {YES/NO AS:20300}  URINATION: Pain with urination: {yes/no:20286} Fully empty bladder: {Yes/No:304960894}***                                         Post-void dribble: {YES/NO AS:20300} Stream: {PT urination:27102} Urgency: {YES/NO AS:20300} Frequency:during the day ***                                                        Nocturia: {Yes/No:304960894}***   Leakage: {PT leakage:27103} Pads/briefs: {Yes/No:304960894}  INTERCOURSE:  Ability to have vaginal penetration {YES/NO:21197} Pain with intercourse: {pain with intercourse PA:27099} Dryness: {YES/NO AS:20300} Climax: *** Marinoff Scale: ***/3 Lubricant:  PREGNANCY: Vaginal deliveries *** Tearing {Yes***/No:304960894} Episiotomy {YES/NO AS:20300} C-section deliveries *** Currently pregnant {Yes***/No:304960894}  PROLAPSE: {PT prolapse:27101}   OBJECTIVE:  Note: Objective measures were completed at Evaluation unless otherwise noted.  DIAGNOSTIC FINDINGS:  Post-void residual: Voiding Cystourethrogram (VCUG):  Ultrasound: ***  PATIENT SURVEYS:  {rehab surveys:24030}  PFIQ-7: *** UIQ-7 *** CRAIG -7 *** POPIQ-7 *** Female Sexual Function Index (FSFI) Questionnaire ***  COGNITION: Overall cognitive status: {cognition:24006}     SENSATION: Light touch: {intact/deficits:24005}  LUMBAR SPECIAL TESTS:  {lumbar special test:25242}  FUNCTIONAL TESTS:  {Functional tests:24029} Single leg stance:  Rt:  Lt: Sit-up test: Squat: Bed mobility:  GAIT: Assistive device utilized: {Assistive devices:23999} Comments: ***  POSTURE:  {posture:25561}   LUMBARAROM/PROM:  A/PROM A/PROM  Eval (% available)  Flexion   Extension   Right lateral flexion   Left lateral flexion   Right rotation   Left rotation    (Blank rows = not tested)  LOWER EXTREMITY ROM:  {AROM/PROM:27142} ROM Right eval Left eval  Hip flexion    Hip extension    Hip abduction    Hip adduction    Hip internal rotation    Hip external rotation    Knee flexion    Knee extension    Ankle dorsiflexion    Ankle plantarflexion    Ankle inversion    Ankle eversion     (Blank rows = not tested)  LOWER EXTREMITY MMT:  MMT Right eval Left eval  Hip flexion    Hip extension  Hip abduction    Hip adduction    Hip internal rotation    Hip external rotation    Knee flexion    Knee extension    Ankle dorsiflexion    Ankle plantarflexion    Ankle inversion    Ankle eversion     (Blank rows = not tested) PALPATION:  General: ***  Pelvic Alignment: ***  Abdominal: ***  Diastasis: {Yes/No:304960894}*** Distortion: {YES/NO AS:20300}  Breathing: *** Scar tissue: {Yes/No:304960894}*** Active Straight Leg Raise: ***                External Perineal Exam: ***                             Internal Pelvic Floor: ***  Patient confirms identification and approves PT to assess internal pelvic floor and treatment {yes/no:20286} All internal or external pelvic floor assessments and/or treatments are completed with proper hand hygiene and gloves hands. If needed gloves are changed with hand hygiene during patient care time.  PELVIC MMT:   MMT eval  Vaginal   Internal Anal Sphincter   External Anal Sphincter   Puborectalis   (Blank rows = not tested)        TONE: ***  PROLAPSE: ***  TODAY'S TREATMENT:                                                                                                                              DATE: ***  EVAL ***   PATIENT EDUCATION:  Education details: *** Person educated: {Person  educated:25204} Education method: {Education Method:25205} Education comprehension: {Education Comprehension:25206}  HOME EXERCISE PROGRAM: ***  ASSESSMENT:  CLINICAL IMPRESSION: Patient is a *** y.o. *** who was seen today for physical therapy evaluation and treatment for ***.   OBJECTIVE IMPAIRMENTS: {opptimpairments:25111}.   ACTIVITY LIMITATIONS: {activitylimitations:27494}  PARTICIPATION LIMITATIONS: {participationrestrictions:25113}  PERSONAL FACTORS: {Personal factors:25162} are also affecting patient's functional outcome.   REHAB POTENTIAL: {rehabpotential:25112}  CLINICAL DECISION MAKING: {clinical decision making:25114}  EVALUATION COMPLEXITY: {Evaluation complexity:25115}   GOALS: Goals reviewed with patient? {yes/no:20286}  SHORT TERM GOALS: Target date: ***  *** Baseline: Goal status: INITIAL  2.  *** Baseline:  Goal status: INITIAL  3.  *** Baseline:  Goal status: INITIAL  4.  *** Baseline:  Goal status: INITIAL  5.  *** Baseline:  Goal status: INITIAL  6.  *** Baseline:  Goal status: INITIAL  LONG TERM GOALS: Target date: ***  *** Baseline:  Goal status: INITIAL  2.  *** Baseline:  Goal status: INITIAL  3.  *** Baseline:  Goal status: INITIAL  4.  *** Baseline:  Goal status: INITIAL  5.  *** Baseline:  Goal status: INITIAL  6.  *** Baseline:  Goal status: INITIAL  PLAN:  PT FREQUENCY: {rehab frequency:25116}  PT DURATION: {rehab duration:25117}  PLANNED INTERVENTIONS: {rehab planned interventions:25118::97110-Therapeutic exercises,97530- Therapeutic [phone]- Neuromuscular re-education,97535- Self Rjmz,02859- Manual therapy,Patient/Family education}  PLAN FOR NEXT SESSION: ***   Carloyn Lahue, PT 10/18/2024, 12:33 PM

## 2024-10-19 ENCOUNTER — Ambulatory Visit: Admitting: Physical Therapy

## 2025-01-26 ENCOUNTER — Ambulatory Visit: Admitting: Internal Medicine

## 2025-02-22 ENCOUNTER — Ambulatory Visit: Admitting: Internal Medicine

## 2025-06-04 ENCOUNTER — Ambulatory Visit
